# Patient Record
Sex: Female | Born: 1960 | Race: White | Hispanic: No | Smoking: Current some day smoker
Health system: Southern US, Community
[De-identification: ages and names within clinical notes are randomized; demographics above are authoritative.]

## PROBLEM LIST (undated history)

## (undated) DIAGNOSIS — N390 Urinary tract infection, site not specified: Secondary | ICD-10-CM

## (undated) DIAGNOSIS — E876 Hypokalemia: Secondary | ICD-10-CM

## (undated) DIAGNOSIS — F1420 Cocaine dependence, uncomplicated: Secondary | ICD-10-CM

## (undated) DIAGNOSIS — C801 Malignant (primary) neoplasm, unspecified: Secondary | ICD-10-CM

## (undated) DIAGNOSIS — N12 Tubulo-interstitial nephritis, not specified as acute or chronic: Secondary | ICD-10-CM

## (undated) DIAGNOSIS — N200 Calculus of kidney: Secondary | ICD-10-CM

## (undated) DIAGNOSIS — Z720 Tobacco use: Secondary | ICD-10-CM

## (undated) DIAGNOSIS — R6510 Systemic inflammatory response syndrome (SIRS) of non-infectious origin without acute organ dysfunction: Secondary | ICD-10-CM

## (undated) HISTORY — PX: VAGINAL HYSTERECTOMY: SUR661

## (undated) HISTORY — PX: TUBAL LIGATION: SHX77

## (undated) HISTORY — PX: RENAL ARTERY STENT: SHX2321

---

## 2001-01-14 ENCOUNTER — Emergency Department (HOSPITAL_COMMUNITY): Admission: EM | Admit: 2001-01-14 | Discharge: 2001-01-14 | Payer: Self-pay | Admitting: Emergency Medicine

## 2001-02-17 ENCOUNTER — Emergency Department (HOSPITAL_COMMUNITY): Admission: EM | Admit: 2001-02-17 | Discharge: 2001-02-18 | Payer: Self-pay | Admitting: Emergency Medicine

## 2001-02-18 ENCOUNTER — Encounter: Payer: Self-pay | Admitting: Emergency Medicine

## 2003-03-10 ENCOUNTER — Emergency Department (HOSPITAL_COMMUNITY): Admission: EM | Admit: 2003-03-10 | Discharge: 2003-03-11 | Payer: Self-pay | Admitting: Emergency Medicine

## 2003-05-21 ENCOUNTER — Emergency Department (HOSPITAL_COMMUNITY): Admission: EM | Admit: 2003-05-21 | Discharge: 2003-05-21 | Payer: Self-pay | Admitting: *Deleted

## 2003-07-21 ENCOUNTER — Emergency Department (HOSPITAL_COMMUNITY): Admission: EM | Admit: 2003-07-21 | Discharge: 2003-07-21 | Payer: Self-pay | Admitting: Emergency Medicine

## 2003-08-04 ENCOUNTER — Emergency Department (HOSPITAL_COMMUNITY): Admission: EM | Admit: 2003-08-04 | Discharge: 2003-08-04 | Payer: Self-pay | Admitting: Emergency Medicine

## 2003-08-14 ENCOUNTER — Emergency Department (HOSPITAL_COMMUNITY): Admission: EM | Admit: 2003-08-14 | Discharge: 2003-08-15 | Payer: Self-pay | Admitting: Emergency Medicine

## 2004-07-28 ENCOUNTER — Emergency Department (HOSPITAL_COMMUNITY): Admission: EM | Admit: 2004-07-28 | Discharge: 2004-07-28 | Payer: Self-pay | Admitting: Emergency Medicine

## 2004-11-06 ENCOUNTER — Emergency Department (HOSPITAL_COMMUNITY): Admission: EM | Admit: 2004-11-06 | Discharge: 2004-11-06 | Payer: Self-pay | Admitting: Emergency Medicine

## 2004-11-07 ENCOUNTER — Emergency Department (HOSPITAL_COMMUNITY): Admission: EM | Admit: 2004-11-07 | Discharge: 2004-11-07 | Payer: Self-pay | Admitting: Family Medicine

## 2004-11-15 ENCOUNTER — Emergency Department (HOSPITAL_COMMUNITY): Admission: EM | Admit: 2004-11-15 | Discharge: 2004-11-15 | Payer: Self-pay | Admitting: Family Medicine

## 2004-11-22 ENCOUNTER — Emergency Department (HOSPITAL_COMMUNITY): Admission: EM | Admit: 2004-11-22 | Discharge: 2004-11-22 | Payer: Self-pay | Admitting: Emergency Medicine

## 2004-12-01 ENCOUNTER — Ambulatory Visit (HOSPITAL_COMMUNITY): Admission: RE | Admit: 2004-12-01 | Discharge: 2004-12-01 | Payer: Self-pay | Admitting: Family Medicine

## 2005-06-29 ENCOUNTER — Ambulatory Visit: Payer: Self-pay | Admitting: Family Medicine

## 2005-07-02 ENCOUNTER — Ambulatory Visit: Payer: Self-pay | Admitting: *Deleted

## 2005-07-25 ENCOUNTER — Ambulatory Visit: Payer: Self-pay | Admitting: Family Medicine

## 2005-09-04 ENCOUNTER — Ambulatory Visit: Payer: Self-pay | Admitting: Family Medicine

## 2005-09-04 ENCOUNTER — Encounter (INDEPENDENT_AMBULATORY_CARE_PROVIDER_SITE_OTHER): Payer: Self-pay | Admitting: *Deleted

## 2005-09-18 ENCOUNTER — Ambulatory Visit (HOSPITAL_COMMUNITY): Admission: RE | Admit: 2005-09-18 | Discharge: 2005-09-18 | Payer: Self-pay | Admitting: Family Medicine

## 2005-10-16 ENCOUNTER — Ambulatory Visit: Payer: Self-pay | Admitting: Family Medicine

## 2005-12-12 ENCOUNTER — Ambulatory Visit: Payer: Self-pay | Admitting: Obstetrics and Gynecology

## 2005-12-12 ENCOUNTER — Other Ambulatory Visit: Admission: RE | Admit: 2005-12-12 | Discharge: 2005-12-12 | Payer: Self-pay | Admitting: Obstetrics & Gynecology

## 2005-12-12 ENCOUNTER — Encounter (INDEPENDENT_AMBULATORY_CARE_PROVIDER_SITE_OTHER): Payer: Self-pay | Admitting: Specialist

## 2005-12-27 ENCOUNTER — Ambulatory Visit: Payer: Self-pay | Admitting: Obstetrics and Gynecology

## 2005-12-30 ENCOUNTER — Emergency Department (HOSPITAL_COMMUNITY): Admission: EM | Admit: 2005-12-30 | Discharge: 2005-12-30 | Payer: Self-pay | Admitting: Emergency Medicine

## 2006-02-06 ENCOUNTER — Ambulatory Visit: Payer: Self-pay | Admitting: Nurse Practitioner

## 2006-04-08 ENCOUNTER — Encounter (INDEPENDENT_AMBULATORY_CARE_PROVIDER_SITE_OTHER): Payer: Self-pay | Admitting: *Deleted

## 2006-04-08 ENCOUNTER — Ambulatory Visit: Payer: Self-pay | Admitting: Obstetrics and Gynecology

## 2006-04-08 ENCOUNTER — Ambulatory Visit (HOSPITAL_COMMUNITY): Admission: RE | Admit: 2006-04-08 | Discharge: 2006-04-08 | Payer: Self-pay | Admitting: Obstetrics and Gynecology

## 2006-04-13 ENCOUNTER — Emergency Department (HOSPITAL_COMMUNITY): Admission: EM | Admit: 2006-04-13 | Discharge: 2006-04-13 | Payer: Self-pay | Admitting: Emergency Medicine

## 2006-05-16 ENCOUNTER — Ambulatory Visit: Payer: Self-pay | Admitting: Nurse Practitioner

## 2006-05-30 ENCOUNTER — Ambulatory Visit: Payer: Self-pay | Admitting: Gynecology

## 2006-06-03 ENCOUNTER — Ambulatory Visit (HOSPITAL_COMMUNITY): Admission: RE | Admit: 2006-06-03 | Discharge: 2006-06-03 | Payer: Self-pay | Admitting: Obstetrics and Gynecology

## 2006-07-05 ENCOUNTER — Ambulatory Visit: Payer: Self-pay | Admitting: Nurse Practitioner

## 2006-07-10 ENCOUNTER — Ambulatory Visit: Payer: Self-pay | Admitting: Family Medicine

## 2006-07-10 ENCOUNTER — Encounter (INDEPENDENT_AMBULATORY_CARE_PROVIDER_SITE_OTHER): Payer: Self-pay | Admitting: Specialist

## 2006-07-10 ENCOUNTER — Ambulatory Visit (HOSPITAL_COMMUNITY): Admission: RE | Admit: 2006-07-10 | Discharge: 2006-07-11 | Payer: Self-pay | Admitting: Gynecology

## 2006-07-16 ENCOUNTER — Inpatient Hospital Stay (HOSPITAL_COMMUNITY): Admission: AD | Admit: 2006-07-16 | Discharge: 2006-07-16 | Payer: Self-pay | Admitting: Gynecology

## 2006-09-30 ENCOUNTER — Emergency Department (HOSPITAL_COMMUNITY): Admission: EM | Admit: 2006-09-30 | Discharge: 2006-10-01 | Payer: Self-pay | Admitting: Emergency Medicine

## 2006-10-30 ENCOUNTER — Emergency Department (HOSPITAL_COMMUNITY): Admission: EM | Admit: 2006-10-30 | Discharge: 2006-10-30 | Payer: Self-pay | Admitting: Emergency Medicine

## 2006-12-27 ENCOUNTER — Ambulatory Visit: Payer: Self-pay | Admitting: Internal Medicine

## 2007-01-06 ENCOUNTER — Emergency Department (HOSPITAL_COMMUNITY): Admission: EM | Admit: 2007-01-06 | Discharge: 2007-01-06 | Payer: Self-pay | Admitting: *Deleted

## 2007-01-08 ENCOUNTER — Encounter (INDEPENDENT_AMBULATORY_CARE_PROVIDER_SITE_OTHER): Payer: Self-pay | Admitting: *Deleted

## 2007-07-23 ENCOUNTER — Ambulatory Visit: Payer: Self-pay | Admitting: Internal Medicine

## 2007-10-22 ENCOUNTER — Encounter (INDEPENDENT_AMBULATORY_CARE_PROVIDER_SITE_OTHER): Payer: Self-pay | Admitting: Family Medicine

## 2007-10-22 ENCOUNTER — Ambulatory Visit: Payer: Self-pay | Admitting: Internal Medicine

## 2007-10-22 LAB — CONVERTED CEMR LAB
Benzodiazepines.: NEGATIVE
Cocaine Metabolites: NEGATIVE
Marijuana Metabolite: NEGATIVE
Methadone: NEGATIVE
Opiate Screen, Urine: POSITIVE — AB
Phencyclidine (PCP): NEGATIVE
Propoxyphene: NEGATIVE

## 2008-01-07 ENCOUNTER — Ambulatory Visit: Payer: Self-pay | Admitting: Internal Medicine

## 2008-01-15 ENCOUNTER — Ambulatory Visit (HOSPITAL_COMMUNITY): Admission: RE | Admit: 2008-01-15 | Discharge: 2008-01-15 | Payer: Self-pay | Admitting: Family Medicine

## 2008-04-02 ENCOUNTER — Ambulatory Visit: Payer: Self-pay | Admitting: Internal Medicine

## 2008-04-02 ENCOUNTER — Encounter (INDEPENDENT_AMBULATORY_CARE_PROVIDER_SITE_OTHER): Payer: Self-pay | Admitting: Internal Medicine

## 2008-06-30 ENCOUNTER — Encounter
Admission: RE | Admit: 2008-06-30 | Discharge: 2008-06-30 | Payer: Self-pay | Admitting: Physical Medicine and Rehabilitation

## 2009-10-26 ENCOUNTER — Ambulatory Visit: Payer: Self-pay | Admitting: Internal Medicine

## 2009-10-27 ENCOUNTER — Encounter (INDEPENDENT_AMBULATORY_CARE_PROVIDER_SITE_OTHER): Payer: Self-pay | Admitting: Adult Health

## 2009-10-27 LAB — CONVERTED CEMR LAB
Barbiturate Quant, Ur: NEGATIVE
Creatinine,U: 67.9 mg/dL
Propoxyphene: NEGATIVE

## 2009-10-29 ENCOUNTER — Emergency Department (HOSPITAL_COMMUNITY): Admission: EM | Admit: 2009-10-29 | Discharge: 2009-10-29 | Payer: Self-pay | Admitting: Emergency Medicine

## 2009-11-03 ENCOUNTER — Ambulatory Visit (HOSPITAL_COMMUNITY): Admission: RE | Admit: 2009-11-03 | Discharge: 2009-11-03 | Payer: Self-pay | Admitting: Internal Medicine

## 2009-11-08 ENCOUNTER — Ambulatory Visit: Payer: Self-pay | Admitting: Adult Health

## 2009-11-08 LAB — CONVERTED CEMR LAB
ALT: 17 units/L (ref 0–35)
Alkaline Phosphatase: 60 units/L (ref 39–117)
BUN: 16 mg/dL (ref 6–23)
CO2: 27 meq/L (ref 19–32)
Chloride: 107 meq/L (ref 96–112)
Creatinine, Ser: 0.85 mg/dL (ref 0.40–1.20)
Eosinophils Absolute: 0.2 10*3/uL (ref 0.0–0.7)
Eosinophils Relative: 3 % (ref 0–5)
Glucose, Bld: 76 mg/dL (ref 70–99)
HCT: 35.4 % — ABNORMAL LOW (ref 36.0–46.0)
Lymphs Abs: 2.2 10*3/uL (ref 0.7–4.0)
MCHC: 33.3 g/dL (ref 30.0–36.0)
Magnesium: 2 mg/dL (ref 1.5–2.5)
Monocytes Relative: 8 % (ref 3–12)
Neutro Abs: 2.5 10*3/uL (ref 1.7–7.7)
Platelets: 202 10*3/uL (ref 150–400)
Potassium: 4.5 meq/L (ref 3.5–5.3)
RBC: 3.5 M/uL — ABNORMAL LOW (ref 3.87–5.11)
RDW: 15 % (ref 11.5–15.5)
Rhuematoid fact SerPl-aCnc: <20 intl units/mL (ref 0–20)
Sodium: 141 meq/L (ref 135–145)
Total Protein: 6.5 g/dL (ref 6.0–8.3)
WBC: 5.3 10*3/uL (ref 4.0–10.5)

## 2009-11-10 ENCOUNTER — Ambulatory Visit (HOSPITAL_COMMUNITY): Admission: RE | Admit: 2009-11-10 | Discharge: 2009-11-10 | Payer: Self-pay | Admitting: Internal Medicine

## 2009-11-14 ENCOUNTER — Ambulatory Visit: Payer: Self-pay | Admitting: Family Medicine

## 2009-11-23 ENCOUNTER — Ambulatory Visit (HOSPITAL_COMMUNITY): Admission: RE | Admit: 2009-11-23 | Discharge: 2009-11-23 | Payer: Self-pay | Admitting: Internal Medicine

## 2009-12-06 ENCOUNTER — Ambulatory Visit: Payer: Self-pay | Admitting: Internal Medicine

## 2009-12-13 ENCOUNTER — Encounter
Admission: RE | Admit: 2009-12-13 | Discharge: 2010-03-13 | Payer: Self-pay | Admitting: Physical Medicine and Rehabilitation

## 2010-02-03 ENCOUNTER — Ambulatory Visit (HOSPITAL_COMMUNITY): Admission: RE | Admit: 2010-02-03 | Discharge: 2010-02-03 | Payer: Self-pay | Admitting: Family Medicine

## 2010-02-09 ENCOUNTER — Emergency Department (HOSPITAL_COMMUNITY): Admission: EM | Admit: 2010-02-09 | Discharge: 2010-02-09 | Payer: Self-pay | Admitting: Emergency Medicine

## 2010-02-27 ENCOUNTER — Emergency Department (HOSPITAL_COMMUNITY): Admission: EM | Admit: 2010-02-27 | Discharge: 2010-02-27 | Payer: Self-pay | Admitting: Emergency Medicine

## 2010-05-03 ENCOUNTER — Encounter (INDEPENDENT_AMBULATORY_CARE_PROVIDER_SITE_OTHER): Payer: Self-pay | Admitting: Internal Medicine

## 2010-05-03 LAB — CONVERTED CEMR LAB
Amphetamine Screen, Ur: NEGATIVE
Barbiturate Quant, Ur: NEGATIVE
Benzodiazepines.: NEGATIVE
Creatinine,U: 15.3 mg/dL
Opiate Screen, Urine: NEGATIVE
Propoxyphene: NEGATIVE

## 2010-05-14 ENCOUNTER — Encounter: Payer: Self-pay | Admitting: Family Medicine

## 2010-05-14 ENCOUNTER — Encounter: Payer: Self-pay | Admitting: Internal Medicine

## 2010-07-04 LAB — URINALYSIS, ROUTINE W REFLEX MICROSCOPIC
Ketones, ur: NEGATIVE mg/dL
Nitrite: NEGATIVE
Protein, ur: NEGATIVE mg/dL
pH: 6 (ref 5.0–8.0)

## 2010-09-08 NOTE — Discharge Summary (Signed)
NAMECATTIE, Kelli Jenkins               ACCOUNT NO.:  [id number]   MEDICAL RECORD NO.:  [id number]          PATIENT TYPE:  OIB   LOCATION:  9308                          [facility]   PHYSICIAN:  Ginger Carne, MD  DATE OF BIRTH:  1961-03-06   DATE OF ADMISSION:  07/10/2006  DATE OF DISCHARGE:  07/11/2006                               DISCHARGE SUMMARY   REASON FOR HOSPITALIZATION:  Stage IA1 carcinoma of cervix.   IN-HOSPITAL PROCEDURES:  Total vaginal hysterectomy with preservation of  both tubes and ovaries.   FINAL DIAGNOSIS:  Total vaginal hysterectomy with preservation of both  tubes and ovaries.   HOSPITAL COURSE:  This patient was admitted because of stage IA1  carcinoma of the cervix.  The patient had appropriate cold knife cone  biopsy prior to performing the aforementioned surgery on March 19,  20008.  Pathology revealed microinvasion of 2 mm.  The patient's  intraoperative course was unremarkable.  Postoperatively, she had an  uneventful course.  She was afebrile, voided well.  Scant vaginal flow.  Calves without tenderness.  Lungs clear.  At the time of this dictation,  H&H pending.   Discharge instructions included contacting the office for temperature  elevation above 1.4 degrees Fahrenheit, increasing abdominal pain,  vaginal bleeding, discharge, constipation, or difficulties with voiding.  The patient had a prescription from another [name] for Vicodin and was  not provided a new narcotic prescription.  The patient was asked to  return to the office in 4 weeks at the [facility] for postoperative evaluation.  All questions answered to the  satisfaction of said patient, and the patient verbalized understanding  of same.      Ginger Carne, MD  Electronically Signed     SHB/MEDQ  D:  07/11/2006  T:  07/11/2006  Job:  [id number]

## 2010-09-08 NOTE — H&P (Signed)
NAME:  Kelli Jenkins, Kelli Jenkins               ACCOUNT NO.:  [id number]   MEDICAL RECORD NO.:  [id number]         PATIENT TYPE:  WOIB   LOCATION:                                [facility]   PHYSICIAN:  Ginger Carne, MD  DATE OF BIRTH:  March 28, 1961   DATE OF ADMISSION:  07/10/2006  DATE OF DISCHARGE:                              HISTORY & PHYSICAL   REASON FOR HOSPITALIZATION:  Micro invasive carcinoma of cervix, stage  IA1.   IN-HOSPITAL PROCEDURES:  Total vaginal hysterectomy, possible total  abdominal hysterectomy.   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  The patient has had a cold-knife cone biopsy  performed on April 08, 2006 which demonstrated focus of microinvasion  squamous cell carcinoma measuring 0.2 cm in depth (2 mm and 0.3 cm in  lateral extension).  Lesion was also present at the endocervical margin  of resection.   OBSTETRICAL/GYNECOLOGICAL HISTORY:  The patient has had three full-term  vaginal deliveries including a cesarean section.   ALLERGIES:  DARVOCET AND IBUPROFEN.  No allergies to latex or iodine.   CURRENT MEDICATIONS:  1. Flexeril 10 mg q.8h. p.r.n. low back pain.  2. Percocet 5/325 one to two every 4 to 6 hours p.r.n. low back pain.   MEDICAL HISTORY:  The patient has arthritis and hypercholesterolemia.   PAST SURGICAL HISTORY:  Bilateral tubal ligation and cold-knife cone  biopsy.   REVIEW OF SYSTEMS:  Fourteen-comprehensive review of systems within  normal limits.   FAMILY HISTORY:  Her brother and mother have type 2 diabetes, and her  brothers have hypertension.   SOCIAL HISTORY:  The patient smokes a half-pack of cigarettes a day.  Denies alcohol or illicit drug abuse.   PHYSICAL EXAMINATION:  Pleasant-appearing female no acute distress.  Blood pressure is 134/89, weight 190 pounds, height 5 feet 8 inches.  HEENT: Grossly normal.  BREAST EXAM:  Without masses, discharge, thickenings or tenderness.  CHEST:  Clear to percussion and auscultation.  CARDIOVASCULAR EXAM:  Without murmurs or enlargements.  Regular rate and  rhythm.  EXTREMITY, LYMPHATIC, SKIN, NEUROLOGICAL, MUSCULOSKELETAL SYSTEMS:  Within normal limits.  ABDOMEN:  Soft without gross hepatosplenomegaly.  PELVIC EXAM:  External Genitalia:  Vulva and vagina normal.  Cervix:  Smooth without erosions or lesions.  Uterus is normal size.  Both adnexa  palpable and found to be normal.   IMPRESSION:  Is stage IA1 carcinoma in situ of cervix.   PLAN:  The patient has no desire for further childbearing and is a  candidate for a total vaginal hysterectomy with preservation of both  tubes and ovaries.  Due to having had a previous cesarean section, a  possible total abdominal hysterectomy or laparoscopic-assisted vaginal  hysterectomy were discussed and understood by said patient.  Ashby Dawes of  said procedure discussed in detail.  Risks including possible injuries  to ureter, bowel and bladder; possible conversion to an open or  laparoscopic procedure were discussed; possible hemorrhage requiring  blood transfusion; infection; pulmonary or infectious complications were  also understood and discussed with the patient.  The patient is  scheduled for a total vaginal hysterectomy on July 10, 2006.      Ginger Carne, MD  Electronically Signed     SHB/MEDQ  D:  07/08/2006  T:  07/08/2006  Job:  [id number]

## 2010-09-08 NOTE — Group Therapy Note (Signed)
NAME:  Kelli Jenkins, Kelli Jenkins NO.:  [id number]   MEDICAL RECORD NO.:  [id number]          PATIENT TYPE:  WOC   LOCATION:  [facility]                   [facility]   PHYSICIAN:  Argentina Donovan, MD        DATE OF BIRTH:  03-Oct-1960   DATE OF SERVICE:  12/27/2005                                    CLINIC NOTE   The patient is a 50 year old gravida 3, para 3-0-0-3 who came in for her  colposcopy results which showed high-grade intraepithelial lesion including  foci of squamous cell carcinoma in situ.  The endocervix showed fragments of  high-grade squamous intraepithelial lesion CIN-3 in the few biopsy fragments  of a solid mass of atypical cells consistent with high-grade intraepithelial  lesion and squamous cell carcinoma.  The patient is going to be scheduled  for a cold knife conization of the cervix.  We have discussed the procedure  and pictures of what we will be doing.  We have talked about the  complication of bleeding possible.  I have talked about the virus that  causes it and the advantage of her having stopped smoking as she has also  already done.  The patient has had one cesarean section other than that no  other surgeries.   ALLERGIES:  SHE IS ALLERGIC TO DARVOCET AND IBUPROFEN.  She is not allergic  to latex.           ______________________________  Argentina Donovan, MD     PR/MEDQ  D:  12/27/2005  T:  12/27/2005  Job:  [id number]

## 2010-09-08 NOTE — Op Note (Signed)
NAME:  Kelli Jenkins, Kelli Jenkins               ACCOUNT NO.:  [id number]   MEDICAL RECORD NO.:  [id number]          PATIENT TYPE:  AMB   LOCATION:  SDC                           [facility]   PHYSICIAN:  Ginger Carne, MD  DATE OF BIRTH:  03/22/1961   DATE OF PROCEDURE:  07/10/2006  DATE OF DISCHARGE:                               OPERATIVE REPORT   PREOPERATIVE DIAGNOSIS:  Stage IA1 carcinoma of cervix.   POSTOPERATIVE DIAGNOSIS:  Stage IA1 carcinoma of cervix.   PROCEDURE:  Total vaginal hysterectomy with preservation of tubes and  ovaries.   PRIMARY SURGEON:  Dr. Shawnie Pons   ASSISTANT:  Dr. Mia Creek.   ESTIMATED BLOOD LOSS:  100 mL.   COMPLICATIONS:  None immediate.   ANESTHESIA:  General.   SPECIMEN:  Uterus, cervix to pathology.   OPERATIVE FINDINGS:  Uterus, tubes and ovaries were unremarkable.  Uterus was globular, about 6 weeks in size consistent with a multiparous  uterus.   OPERATIVE PROCEDURE:  The patient prepped and draped in usual fashion  and placed in lithotomy position.  Betadine solution used for  antiseptic.  The patient catheterized prior to procedure.  After  adequate general anesthesia, tenaculum placed on the anterior and  posterior lips of the cervix.  Circumferential incision made around the  cervix 1-2 cm cephalad to the external os.  Marcaine with epinephrine  was used paracervically prior to making said incision.  Afterwards, the  anterior and posterior peritoneal reflections identified, opened without  injury to their respective organs.  Following this, uterosacral cardinal  ligament complex was clamped, cut, and ligated with 0 Vicryl suture.  Ligature unit used for bipolar cauterization and cutting of the uterine  vasculature with its ascending branches, clamped, cut, and ligation with  0 Vicryl suture of utero-ovarian ligaments bilaterally.  Bleeding points  hemostatically checked.  Blood clots removed.  Closure of the cuff in 1  layer with 0 Vicryl  running interlocking suture, separate suturing  performed in the left vaginal cuff due to bleeding.  The patient  tolerated the procedure well, returned to the postanesthesia recovery  room in excellent condition.  Vaginal packing utilized.     Ginger Carne, MD  Electronically Signed    SHB/MEDQ  D:  07/10/2006  T:  07/10/2006  Job:  [id number]

## 2010-12-14 ENCOUNTER — Emergency Department (HOSPITAL_COMMUNITY)
Admission: EM | Admit: 2010-12-14 | Discharge: 2010-12-14 | Disposition: A | Discharge status: Home / Self Care | Payer: Self-pay | Attending: Emergency Medicine | Admitting: Emergency Medicine

## 2010-12-14 DIAGNOSIS — H7290 Unspecified perforation of tympanic membrane, unspecified ear: Secondary | ICD-10-CM | POA: Insufficient documentation

## 2010-12-14 DIAGNOSIS — H9209 Otalgia, unspecified ear: Secondary | ICD-10-CM | POA: Insufficient documentation

## 2011-01-10 ENCOUNTER — Emergency Department (HOSPITAL_COMMUNITY)
Admission: EM | Admit: 2011-01-10 | Discharge: 2011-01-10 | Disposition: A | Discharge status: Home / Self Care | Payer: No Typology Code available for payment source | Attending: Emergency Medicine | Admitting: Emergency Medicine

## 2011-01-10 ENCOUNTER — Emergency Department (HOSPITAL_COMMUNITY): Payer: Self-pay

## 2011-01-10 DIAGNOSIS — T148XXA Other injury of unspecified body region, initial encounter: Secondary | ICD-10-CM | POA: Insufficient documentation

## 2011-01-10 DIAGNOSIS — S335XXA Sprain of ligaments of lumbar spine, initial encounter: Secondary | ICD-10-CM | POA: Insufficient documentation

## 2011-01-10 DIAGNOSIS — Y92410 Unspecified street and highway as the place of occurrence of the external cause: Secondary | ICD-10-CM | POA: Insufficient documentation

## 2011-01-10 DIAGNOSIS — S139XXA Sprain of joints and ligaments of unspecified parts of neck, initial encounter: Secondary | ICD-10-CM | POA: Insufficient documentation

## 2011-01-30 ENCOUNTER — Emergency Department (HOSPITAL_COMMUNITY): Payer: Self-pay

## 2011-01-30 ENCOUNTER — Emergency Department (HOSPITAL_COMMUNITY)
Admission: EM | Admit: 2011-01-30 | Discharge: 2011-01-30 | Disposition: A | Discharge status: Home / Self Care | Payer: Self-pay | Attending: Emergency Medicine | Admitting: Emergency Medicine

## 2011-01-30 DIAGNOSIS — R109 Unspecified abdominal pain: Secondary | ICD-10-CM | POA: Insufficient documentation

## 2011-01-30 DIAGNOSIS — R112 Nausea with vomiting, unspecified: Secondary | ICD-10-CM | POA: Insufficient documentation

## 2011-01-30 DIAGNOSIS — N200 Calculus of kidney: Secondary | ICD-10-CM | POA: Insufficient documentation

## 2011-01-30 LAB — DIFFERENTIAL
Basophils Absolute: 0 10*3/uL (ref 0.0–0.1)
Eosinophils Relative: 1 % (ref 0–5)
Lymphocytes Relative: 20 % (ref 12–46)
Lymphs Abs: 2.1 10*3/uL (ref 0.7–4.0)
Neutro Abs: 7.6 10*3/uL (ref 1.7–7.7)
Neutrophils Relative %: 73 % (ref 43–77)

## 2011-01-30 LAB — CBC
HCT: 37.6 % (ref 36.0–46.0)
MCV: 93.8 fL (ref 78.0–100.0)
RBC: 4.01 MIL/uL (ref 3.87–5.11)
RDW: 13.1 % (ref 11.5–15.5)
WBC: 10.4 10*3/uL (ref 4.0–10.5)

## 2011-01-30 LAB — LIPASE, BLOOD: Lipase: 33 U/L (ref 11–59)

## 2011-01-30 LAB — COMPREHENSIVE METABOLIC PANEL
AST: 12 U/L (ref 0–37)
Albumin: 3.8 g/dL (ref 3.5–5.2)
Alkaline Phosphatase: 87 U/L (ref 39–117)
BUN: 17 mg/dL (ref 6–23)
CO2: 26 mEq/L (ref 19–32)
Chloride: 102 mEq/L (ref 96–112)
GFR calc non Af Amer: 69 mL/min — ABNORMAL LOW (ref >90)
Potassium: 3.7 mEq/L (ref 3.5–5.1)
Total Bilirubin: 0.4 mg/dL (ref 0.3–1.2)

## 2011-02-01 ENCOUNTER — Emergency Department (HOSPITAL_COMMUNITY)
Admission: EM | Admit: 2011-02-01 | Discharge: 2011-02-01 | Disposition: A | Discharge status: Home / Self Care | Payer: Self-pay | Attending: Emergency Medicine | Admitting: Emergency Medicine

## 2011-02-01 ENCOUNTER — Emergency Department (HOSPITAL_COMMUNITY): Payer: Self-pay

## 2011-02-01 DIAGNOSIS — R112 Nausea with vomiting, unspecified: Secondary | ICD-10-CM | POA: Insufficient documentation

## 2011-02-01 DIAGNOSIS — R1013 Epigastric pain: Secondary | ICD-10-CM | POA: Insufficient documentation

## 2011-02-01 DIAGNOSIS — K219 Gastro-esophageal reflux disease without esophagitis: Secondary | ICD-10-CM | POA: Insufficient documentation

## 2011-02-01 DIAGNOSIS — Z791 Long term (current) use of non-steroidal anti-inflammatories (NSAID): Secondary | ICD-10-CM | POA: Insufficient documentation

## 2011-02-01 DIAGNOSIS — N390 Urinary tract infection, site not specified: Secondary | ICD-10-CM | POA: Insufficient documentation

## 2011-02-01 DIAGNOSIS — F1410 Cocaine abuse, uncomplicated: Secondary | ICD-10-CM | POA: Insufficient documentation

## 2011-02-01 LAB — I-STAT 8, (EC8 V) (CONVERTED LAB)
Bicarbonate: 24.6 — ABNORMAL HIGH
Glucose, Bld: 105 — ABNORMAL HIGH
TCO2: 26
pCO2, Ven: 33.6 — ABNORMAL LOW
pH, Ven: 7.474 — ABNORMAL HIGH

## 2011-02-01 LAB — URINE MICROSCOPIC-ADD ON

## 2011-02-01 LAB — POCT CARDIAC MARKERS
CKMB, poc: 1.1
CKMB, poc: 2.3
Myoglobin, poc: 51.1
Operator id: 285841
Operator id: 285841
Troponin i, poc: 0.07 — ABNORMAL HIGH
Troponin i, poc: <0.05

## 2011-02-01 LAB — COMPREHENSIVE METABOLIC PANEL
AST: 13 U/L (ref 0–37)
Albumin: 3.6 g/dL (ref 3.5–5.2)
Calcium: 9.4 mg/dL (ref 8.4–10.5)
Creatinine, Ser: 0.92 mg/dL (ref 0.50–1.10)
GFR calc non Af Amer: 71 mL/min — ABNORMAL LOW (ref >90)
Total Protein: 6.9 g/dL (ref 6.0–8.3)

## 2011-02-01 LAB — URINALYSIS, ROUTINE W REFLEX MICROSCOPIC
Bilirubin Urine: NEGATIVE
Glucose, UA: NEGATIVE mg/dL
Glucose, UA: NEGATIVE
Ketones, ur: NEGATIVE
Nitrite: POSITIVE — AB
Nitrite: NEGATIVE
Protein, ur: NEGATIVE
Specific Gravity, Urine: 1.019 (ref 1.005–1.030)
pH: 6.5 (ref 5.0–8.0)

## 2011-02-01 LAB — CBC
HCT: 33.9 — ABNORMAL LOW
MCH: 32.8 pg (ref 26.0–34.0)
MCV: 95.3 fL (ref 78.0–100.0)
MCV: 95.2
Platelets: 266 10*3/uL (ref 150–400)
Platelets: 441 — ABNORMAL HIGH
RDW: 13.1 % (ref 11.5–15.5)
RDW: 13.2

## 2011-02-01 LAB — DIFFERENTIAL
Basophils Absolute: 0
Basophils Relative: 0 % (ref 0–1)
Eosinophils Absolute: 0.1 10*3/uL (ref 0.0–0.7)
Eosinophils Absolute: 0.1
Eosinophils Relative: 1 % (ref 0–5)
Eosinophils Relative: 1
Lymphocytes Relative: 20
Lymphs Abs: 2.2 10*3/uL (ref 0.7–4.0)
Monocytes Relative: 6 % (ref 3–12)

## 2011-02-01 LAB — POCT I-STAT TROPONIN I: Troponin i, poc: 0 ng/mL (ref 0.00–0.08)

## 2011-02-01 LAB — RAPID URINE DRUG SCREEN, HOSP PERFORMED
Barbiturates: NOT DETECTED
Benzodiazepines: NOT DETECTED
Cocaine: POSITIVE — AB
Opiates: NOT DETECTED

## 2011-02-01 LAB — D-DIMER, QUANTITATIVE: D-Dimer, Quant: 0.55 — ABNORMAL HIGH

## 2011-02-01 LAB — POCT I-STAT CREATININE: Operator id: 133351

## 2011-02-01 LAB — LIPASE, BLOOD: Lipase: 47 U/L (ref 11–59)

## 2011-02-08 LAB — COMPREHENSIVE METABOLIC PANEL
AST: 16
Albumin: 3.6
Alkaline Phosphatase: 67
BUN: 11
Chloride: 100
GFR calc Af Amer: >60
Potassium: 4.6
Total Bilirubin: 0.9

## 2011-02-08 LAB — CBC
HCT: 40.5
Platelets: 241
WBC: 11.7 — ABNORMAL HIGH

## 2011-02-08 LAB — URINALYSIS, ROUTINE W REFLEX MICROSCOPIC
Glucose, UA: NEGATIVE
pH: 6

## 2011-02-08 LAB — DIFFERENTIAL
Basophils Absolute: 0
Basophils Relative: 0
Eosinophils Absolute: 0
Eosinophils Relative: 0
Monocytes Absolute: 0.9 — ABNORMAL HIGH

## 2011-02-08 LAB — URINE MICROSCOPIC-ADD ON

## 2011-04-12 ENCOUNTER — Emergency Department (HOSPITAL_COMMUNITY)
Admission: EM | Admit: 2011-04-12 | Discharge: 2011-04-12 | Discharge status: Against Medical Advice | Payer: Self-pay | Attending: Emergency Medicine | Admitting: Emergency Medicine

## 2011-04-12 ENCOUNTER — Other Ambulatory Visit: Payer: Self-pay

## 2011-04-12 ENCOUNTER — Encounter: Payer: Self-pay | Admitting: *Deleted

## 2011-04-12 DIAGNOSIS — H9209 Otalgia, unspecified ear: Secondary | ICD-10-CM | POA: Insufficient documentation

## 2011-04-12 DIAGNOSIS — R079 Chest pain, unspecified: Secondary | ICD-10-CM | POA: Insufficient documentation

## 2011-04-12 NOTE — ED Notes (Signed)
Unable to locate x2.

## 2011-08-02 ENCOUNTER — Encounter (HOSPITAL_COMMUNITY): Payer: Self-pay | Admitting: Emergency Medicine

## 2011-08-02 ENCOUNTER — Emergency Department (HOSPITAL_COMMUNITY)
Admission: EM | Admit: 2011-08-02 | Discharge: 2011-08-02 | Disposition: A | Discharge status: Home / Self Care | Payer: Self-pay | Attending: Emergency Medicine | Admitting: Emergency Medicine

## 2011-08-02 DIAGNOSIS — K0889 Other specified disorders of teeth and supporting structures: Secondary | ICD-10-CM

## 2011-08-02 DIAGNOSIS — S025XXA Fracture of tooth (traumatic), initial encounter for closed fracture: Secondary | ICD-10-CM | POA: Insufficient documentation

## 2011-08-02 DIAGNOSIS — F17200 Nicotine dependence, unspecified, uncomplicated: Secondary | ICD-10-CM | POA: Insufficient documentation

## 2011-08-02 DIAGNOSIS — K089 Disorder of teeth and supporting structures, unspecified: Secondary | ICD-10-CM | POA: Insufficient documentation

## 2011-08-02 DIAGNOSIS — IMO0002 Reserved for concepts with insufficient information to code with codable children: Secondary | ICD-10-CM | POA: Insufficient documentation

## 2011-08-02 HISTORY — DX: Malignant (primary) neoplasm, unspecified: C80.1

## 2011-08-02 MED ORDER — OXYCODONE-ACETAMINOPHEN 5-325 MG PO TABS
2.0000 | ORAL_TABLET | Freq: Once | ORAL | Status: AC
  Administered 2011-08-02: 2 via ORAL
  Filled 2011-08-02: qty 2

## 2011-08-02 MED ORDER — PENICILLIN V POTASSIUM 500 MG PO TABS: 500.0000 mg | ORAL_TABLET | Freq: Four times a day (QID) | ORAL | Status: AC

## 2011-08-02 MED ORDER — OXYCODONE-ACETAMINOPHEN 5-325 MG PO TABS: 1.0000 | ORAL_TABLET | ORAL | Status: AC | PRN

## 2011-08-02 NOTE — Progress Notes (Signed)
Patient returned to the ED post discharge stating she could not afford to fill her prescriptions. Per pharmacy, patient is eligible for the [employer]Z-fund. However, the prescription for the penicillin was remotely faxed to the [employer] and the pharmacy requires an actual prescription, not just the discharge paperwork. Therefore, the CDU PA agreed to write a new script based on the patient's chart in EPIC. The script was approved, sent to pharmacy, filled, and taken to the patient who was visiting with a family member in CDU 3.

## 2011-08-02 NOTE — ED Provider Notes (Signed)
History     CSN: [id number]  Arrival date & time 08/02/11  0444   First MD Initiated Contact with Patient 08/02/11 [phone]      Chief Complaint  Patient presents with  . Dental Pain    (Consider location/radiation/quality/duration/timing/severity/associated sxs/prior treatment) HPI Comments: Pt states that she had acute onset of dental pain approx 3 days ago when her rear upper right molar fractured while eating.  Pain is constant, worse with temperature changes or air, not associatd with f/c/n/v or swelling of the jaw.  No pain meds pta.  Patient is a 51 y.o. female presenting with tooth pain. The history is provided by the patient.  Dental PainPrimary symptoms do not include fever or sore throat.  Additional symptoms do not include: facial swelling and trouble swallowing.    Past Medical History  Diagnosis Date  . Cancer     Past Surgical History  Procedure Date  . Abdominal hysterectomy     No family history on file.  History  Substance Use Topics  . Smoking status: Current Everyday Smoker    Types: Cigarettes  . Smokeless tobacco: Not on file  . Alcohol Use: Yes     occ    OB History    Grav Para Term Preterm Abortions TAB SAB Ect Mult Living                  Review of Systems  Constitutional: Negative for fever and chills.  HENT: Positive for dental problem. Negative for sore throat, facial swelling, trouble swallowing and voice change.        Toothache  Gastrointestinal: Negative for nausea and vomiting.    Allergies  Darvocet and Ibuprofen  Home Medications   Current Outpatient Rx  Name Route Sig Dispense Refill  . OXYCODONE-ACETAMINOPHEN 5-325 MG PO TABS Oral Take 1 tablet by mouth every 4 (four) hours as needed for pain. May take 2 tablets PO q 6 hours for severe pain - Do not take with Tylenol as this tablet already contains tylenol 15 tablet 0  . PENICILLIN V POTASSIUM 500 MG PO TABS Oral Take 1 tablet (500 mg total) by mouth 4 (four) times  daily. 40 tablet 0    BP 113/58  Pulse 83  Temp(Src) 97.6 F (36.4 C) (Oral)  Resp 16  SpO2 96%  Physical Exam  Nursing note and vitals reviewed. Constitutional: She appears well-developed and well-nourished. No distress.  HENT:  Head: Normocephalic and atraumatic.  Mouth/Throat: Oropharynx is clear and moist. No oropharyngeal exudate.       Dental Disease - mutliple missing teeth - upper rear right molar with ttp and fracture, no bleeding, no surroundinga bscess.  ttp over tooth.  Eyes: Conjunctivae are normal. No scleral icterus.  Neck: Normal range of motion. Neck supple. No thyromegaly present.  Cardiovascular: Normal rate and regular rhythm.   Pulmonary/Chest: Effort normal and breath sounds normal.  Lymphadenopathy:    She has no cervical adenopathy.  Neurological: She is alert.  Skin: Skin is warm and dry. No rash noted. She is not diaphoretic.    ED Course  Procedures (including critical care time)  Labs Reviewed - No data to display No results found.   1. Toothache       MDM  Dental pain, no abscess - referred to dental, no signs of abscess  Discharge Prescriptions include:  #1 Percocet #2 PCN        Vida Roller, MD 08/02/11 [phone]

## 2011-08-02 NOTE — ED Notes (Signed)
Patient with  Upper back tooth pain.  Pain started last few days, increasing tonight.

## 2011-08-02 NOTE — Discharge Instructions (Signed)
 RESOURCE GUIDE  Dental Problems  Patients with Medicaid: [facility]                     [facility] [address]                                           [address] Phone:  [phone]                                                  Phone:  [phone]  If unable to pay or uninsured, contact:  [facility] or [facility] to become qualified for the adult dental clinic.  Chronic Pain Problems Contact [facility]  [phone] Patients need to be referred by their primary care doctor.  Insufficient Money for Medicine Contact United Way:  call "211" or [facility] 271-5999.  No Primary Care Doctor Call [facility]  [phone] Other agencies that provide inexpensive medical care    [facility]  [phone]    [facility]  [phone]    [facility]  [phone]    Women's Clinic  [phone]    [facility]  [phone]    [facility]  [phone]  Psychological Services [facility]  [phone] [facility]  [phone] [facility]   [phone] (emergency services [phone])  Substance Abuse Resources [facility]  336-882-2125 [facility] 336-784-9470 [facility] 336-285-9073 [facility] 336-845-3988 Residential & Outpatient Substance Abuse Program  800-659-3381  Abuse/Neglect [place] Child Abuse Hotline (336) 641-3795 [place] Child Abuse Hotline 800-378-5315 (After Hours)  Emergency Shelter [facility] (336) 271-5985  [facility] (336) 275-9566 [facility] (704) [phone]  MRSA Hotline #:   832-7006    [place] Resources  [facility] [place]     [employer] Way                          [facility] [address] [place]                       [address]      [address]  [place]                                                [place]                            [place] Phone:  [phone]                                   Phone:  [phone]                 Phone:  [phone]  [facility] Phone:    [phone]  [place] Child Abuse Hotline (336) 342-1394 (336) 342-3537 (After Hours)   

## 2011-08-12 ENCOUNTER — Inpatient Hospital Stay (HOSPITAL_COMMUNITY)
Admission: EM | Admit: 2011-08-12 | Discharge: 2011-08-19 | DRG: 871 | Disposition: A | Discharge status: Home / Self Care | Payer: Self-pay | Attending: Internal Medicine | Admitting: Internal Medicine

## 2011-08-12 ENCOUNTER — Emergency Department (HOSPITAL_COMMUNITY): Payer: Self-pay

## 2011-08-12 ENCOUNTER — Encounter (HOSPITAL_COMMUNITY): Payer: Self-pay | Admitting: *Deleted

## 2011-08-12 DIAGNOSIS — F17200 Nicotine dependence, unspecified, uncomplicated: Secondary | ICD-10-CM

## 2011-08-12 DIAGNOSIS — G43909 Migraine, unspecified, not intractable, without status migrainosus: Secondary | ICD-10-CM | POA: Diagnosis present

## 2011-08-12 DIAGNOSIS — N201 Calculus of ureter: Secondary | ICD-10-CM | POA: Diagnosis present

## 2011-08-12 DIAGNOSIS — N1330 Unspecified hydronephrosis: Secondary | ICD-10-CM | POA: Diagnosis present

## 2011-08-12 DIAGNOSIS — Z6830 Body mass index (BMI) 30.0-30.9, adult: Secondary | ICD-10-CM

## 2011-08-12 DIAGNOSIS — Z9079 Acquired absence of other genital organ(s): Secondary | ICD-10-CM

## 2011-08-12 DIAGNOSIS — R509 Fever, unspecified: Secondary | ICD-10-CM

## 2011-08-12 DIAGNOSIS — J189 Pneumonia, unspecified organism: Secondary | ICD-10-CM

## 2011-08-12 DIAGNOSIS — E669 Obesity, unspecified: Secondary | ICD-10-CM | POA: Diagnosis present

## 2011-08-12 DIAGNOSIS — D72829 Elevated white blood cell count, unspecified: Secondary | ICD-10-CM

## 2011-08-12 DIAGNOSIS — A419 Sepsis, unspecified organism: Principal | ICD-10-CM | POA: Diagnosis present

## 2011-08-12 DIAGNOSIS — R000 Tachycardia, unspecified: Secondary | ICD-10-CM | POA: Diagnosis present

## 2011-08-12 DIAGNOSIS — R6510 Systemic inflammatory response syndrome (SIRS) of non-infectious origin without acute organ dysfunction: Secondary | ICD-10-CM

## 2011-08-12 DIAGNOSIS — E876 Hypokalemia: Secondary | ICD-10-CM | POA: Diagnosis not present

## 2011-08-12 DIAGNOSIS — N12 Tubulo-interstitial nephritis, not specified as acute or chronic: Secondary | ICD-10-CM

## 2011-08-12 DIAGNOSIS — N23 Unspecified renal colic: Secondary | ICD-10-CM

## 2011-08-12 DIAGNOSIS — N200 Calculus of kidney: Secondary | ICD-10-CM

## 2011-08-12 DIAGNOSIS — I959 Hypotension, unspecified: Secondary | ICD-10-CM | POA: Diagnosis present

## 2011-08-12 HISTORY — DX: Tubulo-interstitial nephritis, not specified as acute or chronic: N12

## 2011-08-12 HISTORY — DX: Hypokalemia: E87.6

## 2011-08-12 HISTORY — DX: Calculus of kidney: N20.0

## 2011-08-12 HISTORY — DX: Tobacco use: Z72.0

## 2011-08-12 HISTORY — DX: Systemic inflammatory response syndrome (sirs) of non-infectious origin without acute organ dysfunction: R65.10

## 2011-08-12 LAB — DIFFERENTIAL
Basophils Absolute: 0 10*3/uL (ref 0.0–0.1)
Eosinophils Relative: 1 % (ref 0–5)
Eosinophils Relative: 0 % (ref 0–5)
Lymphocytes Relative: 18 % (ref 12–46)
Lymphocytes Relative: 3 % — ABNORMAL LOW (ref 12–46)
Lymphs Abs: 2.2 10*3/uL (ref 0.7–4.0)
Monocytes Absolute: 0.6 10*3/uL (ref 0.1–1.0)
Monocytes Relative: 5 % (ref 3–12)
WBC Morphology: INCREASED

## 2011-08-12 LAB — CBC
HCT: 41.1 % (ref 36.0–46.0)
HCT: 36.3 % (ref 36.0–46.0)
MCV: 95.6 fL (ref 78.0–100.0)
Platelets: 264 10*3/uL (ref 150–400)
Platelets: 185 10*3/uL (ref 150–400)
RBC: 4.3 MIL/uL (ref 3.87–5.11)
RBC: 3.83 MIL/uL — ABNORMAL LOW (ref 3.87–5.11)
RDW: 14 % (ref 11.5–15.5)
WBC: 12.1 10*3/uL — ABNORMAL HIGH (ref 4.0–10.5)
WBC: 26.3 10*3/uL — ABNORMAL HIGH (ref 4.0–10.5)

## 2011-08-12 LAB — URINALYSIS, ROUTINE W REFLEX MICROSCOPIC
Glucose, UA: NEGATIVE mg/dL
Protein, ur: NEGATIVE mg/dL
pH: 6 (ref 5.0–8.0)

## 2011-08-12 LAB — BASIC METABOLIC PANEL
CO2: 28 mEq/L (ref 19–32)
Calcium: 9.5 mg/dL (ref 8.4–10.5)
Chloride: 104 mEq/L (ref 96–112)
GFR calc Af Amer: 55 mL/min — ABNORMAL LOW (ref >90)
Glucose, Bld: 115 mg/dL — ABNORMAL HIGH (ref 70–99)
Potassium: 3.4 mEq/L — ABNORMAL LOW (ref 3.5–5.1)
Sodium: 138 mEq/L (ref 135–145)

## 2011-08-12 LAB — URINE MICROSCOPIC-ADD ON

## 2011-08-12 LAB — LACTIC ACID, PLASMA: Lactic Acid, Venous: 1.8 mmol/L (ref 0.5–2.2)

## 2011-08-12 LAB — PROCALCITONIN: Procalcitonin: 70.63 ng/mL

## 2011-08-12 MED ORDER — HYDROMORPHONE HCL 2 MG PO TABS: 2.0000 mg | ORAL_TABLET | ORAL | Status: DC | PRN

## 2011-08-12 MED ORDER — TAMSULOSIN HCL 0.4 MG PO CAPS: 0.4000 mg | ORAL_CAPSULE | Freq: Every day | ORAL | Status: DC

## 2011-08-12 MED ORDER — ONDANSETRON HCL 4 MG/2ML IJ SOLN
4.0000 mg | Freq: Once | INTRAMUSCULAR | Status: AC
  Administered 2011-08-12: 4 mg via INTRAVENOUS
  Filled 2011-08-12: qty 2

## 2011-08-12 MED ORDER — HYDROMORPHONE HCL PF 1 MG/ML IJ SOLN
0.5000 mg | INTRAMUSCULAR | Status: DC | PRN
  Administered 2011-08-15: 0.5 mg via INTRAVENOUS
  Filled 2011-08-12 (×2): qty 1

## 2011-08-12 MED ORDER — ONDANSETRON HCL 4 MG PO TABS
4.0000 mg | ORAL_TABLET | Freq: Four times a day (QID) | ORAL | Status: DC | PRN
  Administered 2011-08-14: 4 mg via ORAL
  Filled 2011-08-12: qty 1

## 2011-08-12 MED ORDER — SODIUM CHLORIDE 0.9 % IV SOLN: INTRAVENOUS | Status: DC

## 2011-08-12 MED ORDER — FENTANYL CITRATE 0.05 MG/ML IJ SOLN
50.0000 ug | INTRAMUSCULAR | Status: AC
  Administered 2011-08-12: 50 ug via INTRAVENOUS
  Filled 2011-08-12: qty 2

## 2011-08-12 MED ORDER — DEXTROSE 5 % IV SOLN
1.0000 g | INTRAVENOUS | Status: DC
  Administered 2011-08-12 – 2011-08-13 (×2): 1 g via INTRAVENOUS
  Filled 2011-08-12 (×3): qty 10

## 2011-08-12 MED ORDER — SODIUM CHLORIDE 0.9 % IV SOLN
1000.0000 mL | INTRAVENOUS | Status: DC
  Administered 2011-08-12 – 2011-08-13 (×3): 1000 mL via INTRAVENOUS

## 2011-08-12 MED ORDER — FENTANYL CITRATE 0.05 MG/ML IJ SOLN
INTRAMUSCULAR | Status: AC
  Administered 2011-08-12: 50 ug
  Filled 2011-08-12: qty 2

## 2011-08-12 MED ORDER — ONDANSETRON 8 MG PO TBDP: 8.0000 mg | ORAL_TABLET | Freq: Three times a day (TID) | ORAL | Status: AC | PRN

## 2011-08-12 MED ORDER — SODIUM CHLORIDE 0.9 % IJ SOLN
3.0000 mL | Freq: Two times a day (BID) | INTRAMUSCULAR | Status: DC
  Administered 2011-08-12 – 2011-08-17 (×7): 3 mL via INTRAVENOUS

## 2011-08-12 MED ORDER — ACETAMINOPHEN 325 MG PO TABS
650.0000 mg | ORAL_TABLET | Freq: Four times a day (QID) | ORAL | Status: DC | PRN
  Administered 2011-08-13 – 2011-08-15 (×7): 650 mg via ORAL
  Filled 2011-08-12 (×6): qty 2
  Filled 2011-08-12: qty 1
  Filled 2011-08-12: qty 2

## 2011-08-12 MED ORDER — SODIUM CHLORIDE 0.9 % IV SOLN
INTRAVENOUS | Status: DC
  Administered 2011-08-12 – 2011-08-14 (×4): via INTRAVENOUS

## 2011-08-12 MED ORDER — HYDROMORPHONE HCL PF 1 MG/ML IJ SOLN
1.0000 mg | Freq: Once | INTRAMUSCULAR | Status: AC
  Administered 2011-08-12: 1 mg via INTRAVENOUS
  Filled 2011-08-12: qty 1

## 2011-08-12 MED ORDER — ALUM & MAG HYDROXIDE-SIMETH 200-200-20 MG/5ML PO SUSP: 30.0000 mL | Freq: Four times a day (QID) | ORAL | Status: DC | PRN

## 2011-08-12 MED ORDER — ONDANSETRON HCL 4 MG/2ML IJ SOLN
INTRAMUSCULAR | Status: AC
  Filled 2011-08-12: qty 2

## 2011-08-12 MED ORDER — ONDANSETRON HCL 4 MG/2ML IJ SOLN
4.0000 mg | Freq: Four times a day (QID) | INTRAMUSCULAR | Status: DC | PRN
  Administered 2011-08-15 – 2011-08-19 (×2): 4 mg via INTRAVENOUS
  Filled 2011-08-12 (×2): qty 2

## 2011-08-12 MED ORDER — SODIUM CHLORIDE 0.9 % IV BOLUS (SEPSIS)
500.0000 mL | Freq: Once | INTRAVENOUS | Status: AC
  Administered 2011-08-12: 500 mL via INTRAVENOUS

## 2011-08-12 MED ORDER — ACETAMINOPHEN 650 MG RE SUPP: 650.0000 mg | Freq: Four times a day (QID) | RECTAL | Status: DC | PRN

## 2011-08-12 MED ORDER — OXYCODONE-ACETAMINOPHEN 5-325 MG PO TABS: 1.0000 | ORAL_TABLET | ORAL | Status: AC | PRN

## 2011-08-12 MED ORDER — DEXTROSE 5 % IV SOLN: 1.0000 g | INTRAVENOUS | Status: DC

## 2011-08-12 MED ORDER — PROMETHAZINE HCL 25 MG/ML IJ SOLN
25.0000 mg | Freq: Once | INTRAMUSCULAR | Status: AC
  Administered 2011-08-12: 25 mg via INTRAVENOUS
  Filled 2011-08-12: qty 1

## 2011-08-12 MED ORDER — ZOLPIDEM TARTRATE 5 MG PO TABS: 5.0000 mg | ORAL_TABLET | Freq: Every evening | ORAL | Status: DC | PRN

## 2011-08-12 MED ORDER — ENOXAPARIN SODIUM 40 MG/0.4ML ~~LOC~~ SOLN
40.0000 mg | SUBCUTANEOUS | Status: DC
  Administered 2011-08-14 – 2011-08-18 (×5): 40 mg via SUBCUTANEOUS
  Filled 2011-08-12 (×8): qty 0.4

## 2011-08-12 MED ORDER — OXYCODONE HCL 5 MG PO TABS
5.0000 mg | ORAL_TABLET | ORAL | Status: DC | PRN
  Administered 2011-08-14 – 2011-08-19 (×13): 5 mg via ORAL
  Filled 2011-08-12 (×13): qty 1

## 2011-08-12 NOTE — ED Notes (Signed)
Pt helped on to bedpan and states " I can't get up" when asked to ambulate.

## 2011-08-12 NOTE — ED Notes (Signed)
Dr. Hyman Hopes in room re-evaluating patient.

## 2011-08-12 NOTE — ED Notes (Signed)
Pt continues to sleep and snore in room. nad noted. ABC intact

## 2011-08-12 NOTE — Discharge Instructions (Signed)
Ureteral Colic (Kidney Stones)  Ureteral colic is the result of a condition when kidney stones form inside the kidney. Once kidney stones are formed they may move into the tube that connects the kidney with the bladder (ureter). If this occurs, this condition may cause pain (colic) in the ureter.  CAUSES  Pain is caused by stone movement in the ureter and the obstruction caused by the stone. SYMPTOMS  The pain comes and goes as the ureter contracts around the stone. The pain is usually intense, sharp, and stabbing in character. The location of the pain may move as the stone moves through the ureter. When the stone is near the kidney the pain is usually located in the back and radiates to the belly (abdomen). When the stone is ready to pass into the bladder the pain is often located in the lower abdomen on the side the stone is located. At this location, the symptoms may mimic those of a urinary tract infection with urinary frequency. Once the stone is located here it often passes into the bladder and the pain disappears completely. TREATMENT   Your caregiver will provide you with medicine for pain relief.   You may require specialized follow-up X-rays.   The absence of pain does not always mean that the stone has passed. It may have just stopped moving. If the urine remains completely obstructed, it can cause loss of kidney function or even complete destruction of the involved kidney. It is your responsibility and in your interest that X-rays and follow-ups as suggested by your caregiver are completed. Relief of pain without passage of the stone can be associated with severe damage to the kidney, including loss of kidney function on that side.   If your stone does not pass on its own, additional measures may be taken by your caregiver to ensure its removal.  HOME CARE INSTRUCTIONS   Increase your fluid intake. Water is the preferred fluid since juices containing vitamin C may acidify the urine  making it less likely for certain stones (uric acid stones) to pass.   Strain all urine. A strainer will be provided. Keep all particulate matter or stones for your caregiver to inspect.   Take your pain medicine as directed.   Make a follow-up appointment with your caregiver as directed.   Remember that the goal is passage of your stone. The absence of pain does not mean the stone is gone. Follow your caregiver's instructions.   Only take over-the-counter or prescription medicines for pain, discomfort, or fever as directed by your caregiver.  SEEK MEDICAL CARE IF:   Pain cannot be controlled with the prescribed medicine.   You have a fever.   Pain continues for longer than your caregiver advises it should.   There is a change in the pain, and you develop chest discomfort or constant abdominal pain.   You feel faint or pass out.  MAKE SURE YOU:   Understand these instructions.   Will watch your condition.   Will get help right away if you are not doing well or get worse.  Document Released: 01/17/2005 Document Revised: 03/29/2011 Document Reviewed: 10/04/2010 [facility] Patient Information 2012 [place], [place].  RESOURCE GUIDE  Dental Problems  Patients with Medicaid: [facility] [phone] [address]  [address] Phone:  [phone]                                                   Phone:  [phone]  If unable to pay or uninsured, contact:  [facility] or [facility]. to become qualified for the adult dental clinic.  Chronic Pain Problems Contact [facility]  [phone] Patients need to be referred by their primary care doctor.  Insufficient Money for Medicine Contact United Way:  call "211" or [facility] [phone].  No Primary Care Doctor Call [facility]  504-480-1492 Other agencies that provide inexpensive medical care     [facility]  [phone]    [facility]  [phone]    [facility]  6103377710    [facility]  [phone]    [facility]  865-529-0320    [facility]  [phone]  Psychological Services [facility]  [phone] [facility]  [phone] [facility]   [phone] (emergency services (819)[phone])  Abuse/Neglect [facility] Child Abuse Hotline 514-445-1848 [facility] Child Abuse Hotline 7184264526 (After Hours)  Emergency Shelter [facility] 8022002234  M[facility] [place] [facility] 586-282-2419 [facility] [phone]  MRSA Hotline #:   (623) 862-3227    [facility] Resources  [facility] [place]  United Way                           [facility] [address] [place]                     [address]         [address]  [place] Phone:  [phone]                                  Phone:  [phone]                   Phone:  [phone]  [facility] Phone:  [phone]  [facility] Child Abuse Hotline 732-879-8542 [phone]) [phone] (After Hours)

## 2011-08-12 NOTE — ED Notes (Signed)
Pt noticed to be a bit lethargic, able to arouse by voice, but not able to stay awake, Dr. Hyman Hopes awared, and wanted to ambulate the patient and offer food. Attempted to offer food but pt wont eat, and unable to ambulate pt as well. Dr. Hyman Hopes would then like to monitor this pt for a little bit longer

## 2011-08-12 NOTE — ED Provider Notes (Addendum)
BP 152/69  Pulse 108  Temp(Src) 98.6 F (37 C) (Oral)  Resp 20  SpO2 99%  Patient signed out from Dr. Read Drivers. CT A/P with nephrolithiasis. Somnolent after infiltration of IV with fentanyl + IV dilaudid. Sleeping in room, mildly tachycardic. Awakens with forceful shake but immediately falls back asleep. Adequate RR and oxygen saturation. Will continue to monitor.  Forbes Cellar, MD 08/12/11 1009  Patient continues to remain somnolent, easily arousable per nursing staff, urinated in bed. Continue to monitor. BP 128/66  Pulse 102  Temp(Src) 98.6 F (37 C) (Oral)  Resp 16  SpO2 96%   Forbes Cellar, MD 08/12/11 1121  Re- Evaluation of patient. She is more alert than previous but still sleeping. Nursing staff advised to feed, ambulate.  Forbes Cellar, MD 08/12/11 1432  Patient ambulatory and walking independently to bathroom per ED tech staff. She remains sleepy. They will attempt to call family members to pick her up.  Forbes Cellar, MD 08/12/11 [phone]

## 2011-08-12 NOTE — ED Provider Notes (Signed)
51yo F, presented last night to ED c/o flank pain, dx ureteral stone.  Pt given pain meds and anti-emetics and fell asleep.  Pt apparently has had intermittent low BP's, responsive to IVF, continued lethargic throughout the day.  Tmax to 100.6.  IVF bolus again given for SBP 80 with SBP increasing to 100's.  Will repeat labs and admit.  8:33 PM:  T/C to [facility] Dr. Lovell Sheehan, case discussed, including:  HPI, pertinent PM/SHx, VS/PE, dx testing, ED course and treatment:  Agreeable to admit, requests to obtain tele bed to team 3.   Laray Anger, DO 08/12/11 2033

## 2011-08-12 NOTE — H&P (Signed)
DATE OF ADMISSION:  08/12/2011  PCP:  Pcp Not In System   Chief Complaint:     HPI: Kelli Jenkins is an 51 y.o. female who presented to the Ed 1 day ago due to complaints of severe left flank pain and hematuria, she was evaluated and underwent a CT scan of the ABD and Pelvis and was found to have a Left Ureteral stone.  She was kept in the ED in an attempt to control her pain, and later she developed fevers and chills and hypotension.  A urine culture was ordered and she was administered IV rocephin and referred for medical admission.       Past Medical History  Diagnosis Date  . Cancer     Past Surgical History  Procedure Date  . Abdominal hysterectomy     Medications:  HOME MEDS: Prior to Admission medications   Medication Sig Start Date End Date Taking? Authorizing Provider  penicillin v potassium (VEETID) 500 MG tablet Take 1 tablet (500 mg total) by mouth 4 (four) times daily. 08/02/11 08/12/11 Yes Vida Roller, MD  ondansetron (ZOFRAN ODT) 8 MG disintegrating tablet Take 1 tablet (8 mg total) by mouth every 8 (eight) hours as needed for nausea. 08/12/11 08/19/11  Carlisle Beers Molpus, MD  oxyCODONE-acetaminophen (PERCOCET) 5-325 MG per tablet Take 1 tablet by mouth every 4 (four) hours as needed for pain. May take 2 tablets PO q 6 hours for severe pain - Do not take with Tylenol as this tablet already contains tylenol 08/02/11 08/12/11  Vida Roller, MD  oxyCODONE-acetaminophen (PERCOCET) 5-325 MG per tablet Take 1 tablet by mouth every 4 (four) hours as needed for pain. 08/12/11 08/22/11  Forbes Cellar, MD  Tamsulosin HCl (FLOMAX) 0.4 MG CAPS Take 1 capsule (0.4 mg total) by mouth daily. 08/12/11   Forbes Cellar, MD    Allergies:  Allergies  Allergen Reactions  . Darvocet (Propoxacet-N) Swelling  . Ibuprofen Swelling    Social History:  Tobacco 1 ppd.    reports that she has been smoking Cigarettes.  She does not have any smokeless tobacco history on file. She reports that she  drinks alcohol. She reports that she does not use illicit drugs.  Family History: History reviewed. No pertinent family history.  Review of Systems: Positive for  Fever,  abdominal pain, The patient denies anorexia, weight loss, vision loss, decreased hearing, hoarseness, chest pain, syncope, dyspnea on exertion, peripheral edema, balance deficits, hemoptysis, melena, hematochezia, severe indigestion/heartburn, hematuria, incontinence, genital sores, muscle weakness, suspicious skin lesions, transient blindness, difficulty walking, depression, unusual weight change, abnormal bleeding, enlarged lymph nodes, angioedema, and breast masses.   Physical Exam:  GEN:  Pleasant  51 year old obese Caucasian female  examined and in discomfort but no acute distress; cooperative with exam Filed Vitals:   08/12/11 1842 08/12/11 1843 08/12/11 1944 08/12/11 2201  BP:   102/65 113/69  Pulse:   94 112  Temp: 100.1 F (37.8 C) 100.6 F (38.1 C) 99.5 F (37.5 C) 100.2 F (37.9 C)  TempSrc: Oral Oral Oral Oral  Resp:   16 20  Height:    5\' 8"  (1.727 m)  Weight:    89.54 kg (197 lb 6.4 oz)  SpO2:   98% 93%   Blood pressure 113/69, pulse 112, temperature 100.2 F (37.9 C), temperature source Oral, resp. rate 20, height 5\' 8"  (1.727 m), weight 89.54 kg (197 lb 6.4 oz), SpO2 93.00%. PSYCH: She is alert and oriented x4; does  not appear anxious does not appear depressed; affect is normal HEENT: Normocephalic and Atraumatic, Mucous membranes pink; PERRLA; EOM intact; Fundi:  Benign;  No scleral icterus, Nares: Patent, Oropharynx: Clear, Neck:  FROM, no cervical lymphadenopathy nor thyromegaly or carotid bruit; no JVD; Breasts:: Not examined CHEST WALL: No tenderness CHEST: Normal respiration, clear to auscultation bilaterally HEART: Regular rate and rhythm; no murmurs rubs or gallops BACK: No kyphosis or scoliosis; no CVA tenderness ABDOMEN: Positive Bowel Sounds, Obese, soft non-tender; no masses, no  organomegaly. Rectal Exam: Not done EXTREMITIES: No bone or joint deformity; age-appropriate arthropathy of the hands and knees; no cyanosis, clubbing or edema; no ulcerations. Genitalia: not examined PULSES: 2+ and symmetric SKIN: Normal hydration no rash or ulceration CNS: Cranial nerves 2-12 grossly intact no focal neurologic deficit   Labs & Imaging Results for orders placed during the hospital encounter of 08/12/11 (from the past 48 hour(s))  CBC     Status: Abnormal   Collection Time   08/12/11  4:15 AM      Component Value Range Comment   WBC 12.1 (*) 4.0 - 10.5 (K/uL)    RBC 4.30  3.87 - 5.11 (MIL/uL)    Hemoglobin 14.0  12.0 - 15.0 (g/dL)    HCT 16.1  09.6 - 04.5 (%)    MCV 95.6  78.0 - 100.0 (fL)    MCH 32.6  26.0 - 34.0 (pg)    MCHC 34.1  30.0 - 36.0 (g/dL)    RDW 40.9  81.1 - 91.4 (%)    Platelets 264  150 - 400 (K/uL)   DIFFERENTIAL     Status: Abnormal   Collection Time   08/12/11  4:15 AM      Component Value Range Comment   Neutrophils Relative 76  43 - 77 (%)    Neutro Abs 9.2 (*) 1.7 - 7.7 (K/uL)    Lymphocytes Relative 18  12 - 46 (%)    Lymphs Abs 2.2  0.7 - 4.0 (K/uL)    Monocytes Relative 5  3 - 12 (%)    Monocytes Absolute 0.6  0.1 - 1.0 (K/uL)    Eosinophils Relative 1  0 - 5 (%)    Eosinophils Absolute 0.1  0.0 - 0.7 (K/uL)    Basophils Relative 0  0 - 1 (%)    Basophils Absolute 0.0  0.0 - 0.1 (K/uL)   BASIC METABOLIC PANEL     Status: Abnormal   Collection Time   08/12/11  4:15 AM      Component Value Range Comment   Sodium 138  135 - 145 (mEq/L)    Potassium 3.6  3.5 - 5.1 (mEq/L)    Chloride 101  96 - 112 (mEq/L)    CO2 28  19 - 32 (mEq/L)    Glucose, Bld 115 (*) 70 - 99 (mg/dL)    BUN 18  6 - 23 (mg/dL)    Creatinine, Ser 7.82  0.50 - 1.10 (mg/dL)    Calcium 9.5  8.4 - 10.5 (mg/dL)    GFR calc non Af Amer 59 (*) >90 (mL/min)    GFR calc Af Amer 69 (*) >90 (mL/min)   URINALYSIS, ROUTINE W REFLEX MICROSCOPIC     Status: Abnormal    Collection Time   08/12/11  4:31 AM      Component Value Range Comment   Color, Urine YELLOW  YELLOW     APPearance CLOUDY (*) CLEAR     Specific Gravity, Urine 1.024  1.005 - 1.030     pH 6.0  5.0 - 8.0     Glucose, UA NEGATIVE  NEGATIVE (mg/dL)    Hgb urine dipstick LARGE (*) NEGATIVE     Bilirubin Urine NEGATIVE  NEGATIVE     Ketones, ur NEGATIVE  NEGATIVE (mg/dL)    Protein, ur NEGATIVE  NEGATIVE (mg/dL)    Urobilinogen, UA 0.2  0.0 - 1.0 (mg/dL)    Nitrite NEGATIVE  NEGATIVE     Leukocytes, UA SMALL (*) NEGATIVE    URINE MICROSCOPIC-ADD ON     Status: Abnormal   Collection Time   08/12/11  4:31 AM      Component Value Range Comment   Squamous Epithelial / LPF RARE  RARE     WBC, UA 3-6  <3 (WBC/hpf)    RBC / HPF TOO NUMEROUS TO COUNT  <3 (RBC/hpf)    Bacteria, UA FEW (*) RARE     Urine-Other LESS THAN 10 mL OF URINE SUBMITTED     CBC     Status: Abnormal   Collection Time   08/12/11  8:30 PM      Component Value Range Comment   WBC 26.3 (*) 4.0 - 10.5 (K/uL)    RBC 3.83 (*) 3.87 - 5.11 (MIL/uL)    Hemoglobin 12.5  12.0 - 15.0 (g/dL)    HCT 16.1  09.6 - 04.5 (%)    MCV 94.8  78.0 - 100.0 (fL)    MCH 32.6  26.0 - 34.0 (pg)    MCHC 34.4  30.0 - 36.0 (g/dL)    RDW 40.9  81.1 - 91.4 (%)    Platelets 185  150 - 400 (K/uL)   DIFFERENTIAL     Status: Abnormal   Collection Time   08/12/11  8:30 PM      Component Value Range Comment   Neutrophils Relative 92 (*) 43 - 77 (%)    Lymphocytes Relative 3 (*) 12 - 46 (%)    Monocytes Relative 5  3 - 12 (%)    Eosinophils Relative 0  0 - 5 (%)    Basophils Relative 0  0 - 1 (%)    Neutro Abs 24.2 (*) 1.7 - 7.7 (K/uL)    Lymphs Abs 0.8  0.7 - 4.0 (K/uL)    Monocytes Absolute 1.3 (*) 0.1 - 1.0 (K/uL)    Eosinophils Absolute 0.0  0.0 - 0.7 (K/uL)    Basophils Absolute 0.0  0.0 - 0.1 (K/uL)    WBC Morphology INCREASED BANDS (>20% BANDS)     BASIC METABOLIC PANEL     Status: Abnormal   Collection Time   08/12/11  8:30 PM       Component Value Range Comment   Sodium 139  135 - 145 (mEq/L)    Potassium 3.4 (*) 3.5 - 5.1 (mEq/L)    Chloride 104  96 - 112 (mEq/L)    CO2 25  19 - 32 (mEq/L)    Glucose, Bld 106 (*) 70 - 99 (mg/dL)    BUN 16  6 - 23 (mg/dL)    Creatinine, Ser 7.82 (*) 0.50 - 1.10 (mg/dL)    Calcium 8.9  8.4 - 10.5 (mg/dL)    GFR calc non Af Amer 48 (*) >90 (mL/min)    GFR calc Af Amer 55 (*) >90 (mL/min)   LACTIC ACID, PLASMA     Status: Normal   Collection Time   08/12/11  8:30 PM      Component Value  Range Comment   Lactic Acid, Venous 1.8  0.5 - 2.2 (mmol/L)   PROCALCITONIN     Status: Normal   Collection Time   08/12/11  8:30 PM      Component Value Range Comment   Procalcitonin 70.63      Ct Abdomen Pelvis Wo Contrast  08/12/2011  *RADIOLOGY REPORT*  Clinical Data: Left flank and left lower quadrant abdominal pain. Sudden onset.  Vomiting.  CT ABDOMEN AND PELVIS WITHOUT CONTRAST  Technique:  Multidetector CT imaging of the abdomen and pelvis was performed following the standard protocol without intravenous contrast.  Comparison: 06/03/2006  Findings: The lung bases are clear.  There is a 3 mm stone in the distal left ureter just above the ureterovesicle junction with proximal ureterectasis and pyelocaliectasis.  Minimal periureteral stranding.  Changes are consistent with mild to moderate obstruction.  No additional renal, ureteral, or bladder stones are visualized.  The right ureter and collecting system are decompressed.  The bladder is decompressed.  The unenhanced appearance of the liver, spleen, gallbladder, pancreas, adrenal glands, abdominal aorta, retroperitoneal lymph nodes, stomach, small bowel, and colon is unremarkable.  No free air or free fluid in the abdomen.  Pelvis:  No free or loculated pelvic fluid collections.  No abnormal adnexal masses.  Surgical absence of the uterus.  The appendix is normal.  Degenerative changes in the lumbar spine.  IMPRESSION: 3 mm distal left ureteral stone  causing mild to moderate proximal obstruction.  Original Report Authenticated By: Marlon Pel, M.D.      Assessment: Present on Admission:  .Pyelonephritis .Ureteral colic .Hypotension .Fever  Leukocytosis  Nephrolithiasis  Tobacco Use Disorder     Plan:  Admit to Telemetry Bed Urine C+S sent and IV Rocephin ordered Pain control IVFs for Fluid resuscitation Antipyretics PRN.   DVT prophylaxis Tobacco Cessation Counseling Social work Consult due to ?Homelessness Other plans as per orders.    CODE STATUS:      FULL CODE        Jaimee Corum C 08/12/2011, 11:42 PM

## 2011-08-12 NOTE — ED Notes (Signed)
CSW met with pt per request of RN. RN informed Clinical research associate that pt has recently lost her apartment, means of transportation, and has no where to go upon discharge. Pt informed Clinical research associate that she has never utilized a shelter before but would like assistance. CSW contacted [facility] but was informed that there were no female beds available tonight. CSW contacted [facility] who informed writer that pt could come there for the night. CSW provided travel assistance to transport pt. No other concerns verbalized by Clinical research associate at this time.

## 2011-08-12 NOTE — ED Notes (Signed)
Upon entering room to do hourly rounding, pt found to be lying on prone position, without pants, blankets on the floor, and a significant amount of urine on the floor. Pt able to wake up upon calling her name, arousable, able to follow command, Good oxygen saturation noted

## 2011-08-12 NOTE — ED Notes (Signed)
Pt ambulated to the restroom with no assistance. When finished ambulating pt goes back to bed to sleep. Pt states " I haven't slept in days".

## 2011-08-12 NOTE — ED Notes (Signed)
Awaiting md to see pt prior to transfer to floor

## 2011-08-12 NOTE — ED Notes (Signed)
Pt sts she feels slightly constipated but unable to adequately use restroom. Patient attempting to use restroom but is unable to produce results. Patient unable to stay still for assessment. Sts pain is in her left lower abdomen.

## 2011-08-12 NOTE — ED Notes (Signed)
Pt c/o L flank and LLQ abdominal pain sudden onset w/ vomiting x 2 hrs

## 2011-08-12 NOTE — ED Provider Notes (Signed)
History     CSN: [id number]  Arrival date & time 08/12/11  0401   First MD Initiated Contact with Patient 08/12/11 [phone]      Chief Complaint  Patient presents with  . Flank Pain    (Consider location/radiation/quality/duration/timing/severity/associated sxs/prior treatment) HPI Level 5 Caveat: severe pain This is a 51 year old white female who had the sudden onset of left flank pain radiating to the left lower quadrant. Its onset was just prior to arrival. The symptoms are described as severe. The pain is accompanied by nausea and vomiting as well as urinary urgency. The pain is described as a burning and a sensation that her insides are being twisted. There is some exacerbation of pain with movement but the pain is present even when still. She denies fever or chills. She denies past history of kidney stone.  Past Medical History  Diagnosis Date  . Cancer     Past Surgical History  Procedure Date  . Abdominal hysterectomy     History reviewed. No pertinent family history.  History  Substance Use Topics  . Smoking status: Current Everyday Smoker    Types: Cigarettes  . Smokeless tobacco: Not on file  . Alcohol Use: Yes     occasionally    OB History    Grav Para Term Preterm Abortions TAB SAB Ect Mult Living                  Review of Systems  Unable to perform ROS   Allergies  Darvocet and Ibuprofen  Home Medications   Current Outpatient Rx  Name Route Sig Dispense Refill  . PENICILLIN V POTASSIUM 500 MG PO TABS Oral Take 1 tablet (500 mg total) by mouth 4 (four) times daily. 40 tablet 0  . OXYCODONE-ACETAMINOPHEN 5-325 MG PO TABS Oral Take 1 tablet by mouth every 4 (four) hours as needed for pain. May take 2 tablets PO q 6 hours for severe pain - Do not take with Tylenol as this tablet already contains tylenol 15 tablet 0    BP 121/67  Pulse 95  Temp(Src) 98.6 F (37 C) (Oral)  Resp 20  SpO2 100%  Physical Exam General: Well-developed,  well-nourished female in obvious discomfort; appearance consistent with age of record HENT: normocephalic, atraumatic Eyes: pupils equal round and reactive to light; extraocular muscles intact Neck: supple Heart: regular rate and rhythm Lungs: clear to auscultation bilaterally Abdomen: soft; nondistended; mild left lower quadrant tenderness GU: Mild left flank tenderness Extremities: No deformity; full range of motion Neurologic: Awake, alert and oriented; motor function intact in all extremities and symmetric; no facial droop Skin: Warm and dry Psychiatric: Agitated, anxious    ED Course  Procedures (including critical care time)     MDM   Nursing notes and vitals signs, including pulse oximetry, reviewed.  Summary of this visit's results, reviewed by myself:  Labs:  Results for orders placed during the hospital encounter of 08/12/11  CBC      Component Value Range   WBC 12.1 (*) 4.0 - 10.5 (K/uL)   RBC 4.30  3.87 - 5.11 (MIL/uL)   Hemoglobin 14.0  12.0 - 15.0 (g/dL)   HCT 29.5  62.1 - 30.8 (%)   MCV 95.6  78.0 - 100.0 (fL)   MCH 32.6  26.0 - 34.0 (pg)   MCHC 34.1  30.0 - 36.0 (g/dL)   RDW 65.7  84.6 - 96.2 (%)   Platelets 264  150 - 400 (K/uL)  DIFFERENTIAL  Component Value Range   Neutrophils Relative 76  43 - 77 (%)   Neutro Abs 9.2 (*) 1.7 - 7.7 (K/uL)   Lymphocytes Relative 18  12 - 46 (%)   Lymphs Abs 2.2  0.7 - 4.0 (K/uL)   Monocytes Relative 5  3 - 12 (%)   Monocytes Absolute 0.6  0.1 - 1.0 (K/uL)   Eosinophils Relative 1  0 - 5 (%)   Eosinophils Absolute 0.1  0.0 - 0.7 (K/uL)   Basophils Relative 0  0 - 1 (%)   Basophils Absolute 0.0  0.0 - 0.1 (K/uL)  BASIC METABOLIC PANEL      Component Value Range   Sodium 138  135 - 145 (mEq/L)   Potassium 3.6  3.5 - 5.1 (mEq/L)   Chloride 101  96 - 112 (mEq/L)   CO2 28  19 - 32 (mEq/L)   Glucose, Bld 115 (*) 70 - 99 (mg/dL)   BUN 18  6 - 23 (mg/dL)   Creatinine, Ser 1.61  0.50 - 1.10 (mg/dL)   Calcium  9.5  8.4 - 10.5 (mg/dL)   GFR calc non Af Amer 59 (*) >90 (mL/min)   GFR calc Af Amer 69 (*) >90 (mL/min)  URINALYSIS, ROUTINE W REFLEX MICROSCOPIC      Component Value Range   Color, Urine YELLOW  YELLOW    APPearance CLOUDY (*) CLEAR    Specific Gravity, Urine 1.024  1.005 - 1.030    pH 6.0  5.0 - 8.0    Glucose, UA NEGATIVE  NEGATIVE (mg/dL)   Hgb urine dipstick LARGE (*) NEGATIVE    Bilirubin Urine NEGATIVE  NEGATIVE    Ketones, ur NEGATIVE  NEGATIVE (mg/dL)   Protein, ur NEGATIVE  NEGATIVE (mg/dL)   Urobilinogen, UA 0.2  0.0 - 1.0 (mg/dL)   Nitrite NEGATIVE  NEGATIVE    Leukocytes, UA SMALL (*) NEGATIVE   URINE MICROSCOPIC-ADD ON      Component Value Range   Squamous Epithelial / LPF RARE  RARE    WBC, UA 3-6  <3 (WBC/hpf)   RBC / HPF TOO NUMEROUS TO COUNT  <3 (RBC/hpf)   Bacteria, UA FEW (*) RARE    Urine-Other LESS THAN 10 mL OF URINE SUBMITTED      Imaging Studies: Ct Abdomen Pelvis Wo Contrast  08/12/2011  *RADIOLOGY REPORT*  Clinical Data: Left flank and left lower quadrant abdominal pain. Sudden onset.  Vomiting.  CT ABDOMEN AND PELVIS WITHOUT CONTRAST  Technique:  Multidetector CT imaging of the abdomen and pelvis was performed following the standard protocol without intravenous contrast.  Comparison: 06/03/2006  Findings: The lung bases are clear.  There is a 3 mm stone in the distal left ureter just above the ureterovesicle junction with proximal ureterectasis and pyelocaliectasis.  Minimal periureteral stranding.  Changes are consistent with mild to moderate obstruction.  No additional renal, ureteral, or bladder stones are visualized.  The right ureter and collecting system are decompressed.  The bladder is decompressed.  The unenhanced appearance of the liver, spleen, gallbladder, pancreas, adrenal glands, abdominal aorta, retroperitoneal lymph nodes, stomach, small bowel, and colon is unremarkable.  No free air or free fluid in the abdomen.  Pelvis:  No free or loculated  pelvic fluid collections.  No abnormal adnexal masses.  Surgical absence of the uterus.  The appendix is normal.  Degenerative changes in the lumbar spine.  IMPRESSION: 3 mm distal left ureteral stone causing mild to moderate proximal obstruction.  Original Report Authenticated  By: Marlon Pel, M.D.   8:01 AM Initial administration of IV Zofran and fentanyl not adequately treating pain. It is suspected that her IV infiltrated. Nursing staff has started a new IV and administered additional Phenergan and Dilantin. She is currently somnolent; she is on the cardiac monitor, pulse oximetry and being given oxygen by nasal cannula. She'll be reevaluated by Dr. Hyman Hopes who will make disposition.           Hanley Seamen, MD 08/12/11 [phone]

## 2011-08-13 ENCOUNTER — Encounter (HOSPITAL_COMMUNITY): Payer: Self-pay | Admitting: Internal Medicine

## 2011-08-13 DIAGNOSIS — R6510 Systemic inflammatory response syndrome (SIRS) of non-infectious origin without acute organ dysfunction: Secondary | ICD-10-CM

## 2011-08-13 LAB — CBC
HCT: 34.3 % — ABNORMAL LOW (ref 36.0–46.0)
Hemoglobin: 11.7 g/dL — ABNORMAL LOW (ref 12.0–15.0)
RBC: 3.6 MIL/uL — ABNORMAL LOW (ref 3.87–5.11)

## 2011-08-13 LAB — BASIC METABOLIC PANEL
Chloride: 105 mEq/L (ref 96–112)
GFR calc Af Amer: 67 mL/min — ABNORMAL LOW (ref >90)
GFR calc non Af Amer: 58 mL/min — ABNORMAL LOW (ref >90)
Potassium: 2.9 mEq/L — ABNORMAL LOW (ref 3.5–5.1)
Sodium: 138 mEq/L (ref 135–145)

## 2011-08-13 LAB — MAGNESIUM: Magnesium: 1.7 mg/dL (ref 1.5–2.5)

## 2011-08-13 MED ORDER — TAMSULOSIN HCL 0.4 MG PO CAPS
0.4000 mg | ORAL_CAPSULE | Freq: Every day | ORAL | Status: DC
  Administered 2011-08-13 – 2011-08-15 (×3): 0.4 mg via ORAL
  Filled 2011-08-13 (×3): qty 1

## 2011-08-13 MED ORDER — POTASSIUM CHLORIDE CRYS ER 20 MEQ PO TBCR
40.0000 meq | EXTENDED_RELEASE_TABLET | Freq: Once | ORAL | Status: AC
  Administered 2011-08-13: 40 meq via ORAL
  Filled 2011-08-13: qty 2

## 2011-08-13 MED ORDER — SODIUM CHLORIDE 0.9 % IJ SOLN
1000.0000 mL | INTRAMUSCULAR | Status: AC
  Administered 2011-08-13: 1000 mL via INTRAVENOUS
  Filled 2011-08-13: qty 1002

## 2011-08-13 MED ORDER — VANCOMYCIN HCL IN DEXTROSE 1-5 GM/200ML-% IV SOLN
1000.0000 mg | Freq: Two times a day (BID) | INTRAVENOUS | Status: DC
  Administered 2011-08-14 – 2011-08-15 (×4): 1000 mg via INTRAVENOUS
  Filled 2011-08-13 (×6): qty 200

## 2011-08-13 MED ORDER — POTASSIUM CHLORIDE 10 MEQ/100ML IV SOLN
10.0000 meq | INTRAVENOUS | Status: AC
  Administered 2011-08-13 (×3): 10 meq via INTRAVENOUS
  Filled 2011-08-13 (×3): qty 100

## 2011-08-13 NOTE — Progress Notes (Addendum)
Pt was seen and examined at bedside. I have reviewed labs and vitals which are stable and pt is hemodynamically stable. I agree with the above's assessment and plan. Will reassess in AM. If persistent fever and pain, may need urology consult. Broaden the spectrum of abx given high procalcitonin and persistent fever on Ceftriaxone alone.  Debbora Presto  [facility], pager #: [phone] Main office number: [phone]

## 2011-08-13 NOTE — Progress Notes (Signed)
Subjective: In bed with covers over head. Respers non labored. Easily aroused. Reports pain in abdomen. Denies dysuria, frequency.  Objective: Vital signs Filed Vitals:   08/12/11 2201 08/13/11 0359 08/13/11 0945 08/13/11 1039  BP: 113/69 105/62 110/65   Pulse: 112 123 120   Temp: 100.2 F (37.9 C) 100.7 F (38.2 C) 102.9 F (39.4 C) 100.1 F (37.8 C)  TempSrc: Oral Oral Oral Oral  Resp: 20 20 32   Height: 5\' 8"  (1.727 m)     Weight: 89.54 kg (197 lb 6.4 oz)     SpO2: 93% 92% 95%    Weight change:  Last BM Date: 08/10/11  Intake/Output from previous day: 04/21 0701 - 04/22 0700 In: -  Out: 1300 [Urine:1300] Total I/O In: 480 [P.O.:480] Out: 1300 [Urine:1300]   Physical Exam: General: Drowsy but easily aroused, oriented x3, in no acute distress. HEENT: No bruits, no goiter. Mucus membranes mouth dry/pink. PERRL  Heart: tachycardic but sinus rhythm, without murmurs, rubs, gallops. Lungs: Normal effort. Breath sounds Clear to auscultation bilaterally. No wheeze Abdomen: Obese,  Soft, nontender, nondistended, positive bowel sounds. Extremities: No clubbing cyanosis or edema with positive pedal pulses. Neuro: Grossly intact, nonfocal. Cranial nerve II-XII intact.    Lab Results: Basic Metabolic Panel:  Basename 08/13/11 0428 08/12/11 2030  NA 138 139  K 2.9* 3.4*  CL 105 104  CO2 22 25  GLUCOSE 102* 106*  BUN 13 16  CREATININE 1.08 1.27*  CALCIUM 8.7 8.9  MG 1.7 --  PHOS -- --   Liver Function Tests: No results found for this basename: AST:2,ALT:2,ALKPHOS:2,BILITOT:2,PROT:2,ALBUMIN:2 in the last 72 hours No results found for this basename: LIPASE:2,AMYLASE:2 in the last 72 hours No results found for this basename: AMMONIA:2 in the last 72 hours CBC:  Basename 08/13/11 0428 08/12/11 2030 08/12/11 0415  WBC 18.6* 26.3* --  NEUTROABS -- 24.2* 9.2*  HGB 11.7* 12.5 --  HCT 34.3* 36.3 --  MCV 95.3 94.8 --  PLT 167 185 --   Cardiac Enzymes: No results found  for this basename: CKTOTAL:3,CKMB:3,CKMBINDEX:3,TROPONINI:3 in the last 72 hours BNP: No results found for this basename: PROBNP:3 in the last 72 hours D-Dimer: No results found for this basename: DDIMER:2 in the last 72 hours CBG: No results found for this basename: GLUCAP:6 in the last 72 hours Hemoglobin A1C: No results found for this basename: HGBA1C in the last 72 hours Fasting Lipid Panel: No results found for this basename: CHOL,HDL,LDLCALC,TRIG,CHOLHDL,LDLDIRECT in the last 72 hours Thyroid Function Tests: No results found for this basename: TSH,T4TOTAL,FREET4,T3FREE,THYROIDAB in the last 72 hours Anemia Panel: No results found for this basename: VITAMINB12,FOLATE,FERRITIN,TIBC,IRON,RETICCTPCT in the last 72 hours Coagulation: No results found for this basename: LABPROT:2,INR:2 in the last 72 hours Urine Drug Screen: Drugs of Abuse     Component Value Date/Time   LABOPIA NONE DETECTED 02/01/2011 0800   LABOPIA NEG 05/03/2010 0130   COCAINSCRNUR POSITIVE* 02/01/2011 0800   COCAINSCRNUR POS* 05/03/2010 0130   LABBENZ NONE DETECTED 02/01/2011 0800   LABBENZ NEG 05/03/2010 0130   AMPHETMU NONE DETECTED 02/01/2011 0800   AMPHETMU NEG 05/03/2010 0130   THCU NONE DETECTED 02/01/2011 0800   LABBARB NONE DETECTED 02/01/2011 0800    Alcohol Level: No results found for this basename: ETH:2 in the last 72 hours Urinalysis:  Basename 08/12/11 0431  COLORURINE YELLOW  LABSPEC 1.024  PHURINE 6.0  GLUCOSEU NEGATIVE  HGBUR LARGE*  BILIRUBINUR NEGATIVE  KETONESUR NEGATIVE  PROTEINUR NEGATIVE  UROBILINOGEN 0.2  NITRITE NEGATIVE  LEUKOCYTESUR SMALL*   Misc. Labs:  No results found for this or any previous visit (from the past 240 hour(s)).  Studies/Results: Ct Abdomen Pelvis Wo Contrast  08/12/2011  *RADIOLOGY REPORT*  Clinical Data: Left flank and left lower quadrant abdominal pain. Sudden onset.  Vomiting.  CT ABDOMEN AND PELVIS WITHOUT CONTRAST  Technique:  Multidetector CT  imaging of the abdomen and pelvis was performed following the standard protocol without intravenous contrast.  Comparison: 06/03/2006  Findings: The lung bases are clear.  There is a 3 mm stone in the distal left ureter just above the ureterovesicle junction with proximal ureterectasis and pyelocaliectasis.  Minimal periureteral stranding.  Changes are consistent with mild to moderate obstruction.  No additional renal, ureteral, or bladder stones are visualized.  The right ureter and collecting system are decompressed.  The bladder is decompressed.  The unenhanced appearance of the liver, spleen, gallbladder, pancreas, adrenal glands, abdominal aorta, retroperitoneal lymph nodes, stomach, small bowel, and colon is unremarkable.  No free air or free fluid in the abdomen.  Pelvis:  No free or loculated pelvic fluid collections.  No abnormal adnexal masses.  Surgical absence of the uterus.  The appendix is normal.  Degenerative changes in the lumbar spine.  IMPRESSION: 3 mm distal left ureteral stone causing mild to moderate proximal obstruction.  Original Report Authenticated By: Marlon Pel, M.D.    Medications: Scheduled Meds:   . cefTRIAXone (ROCEPHIN)  IV  1 g Intravenous Q24H  . enoxaparin  40 mg Subcutaneous Q24H  . potassium chloride  10 mEq Intravenous Q1 Hr x 3  . sodium chloride  500 mL Intravenous Once  . sodium chloride  1,000 mL Intravenous NOW  . sodium chloride  3 mL Intravenous Q12H  . Tamsulosin HCl  0.4 mg Oral Daily  . DISCONTD: cefTRIAXone (ROCEPHIN)  IV  1 g Intravenous Q24H   Continuous Infusions:   . sodium chloride 125 mL/hr at 08/13/11 0505  . DISCONTD: sodium chloride 1,000 mL (08/13/11 1233)  . DISCONTD: sodium chloride 125 mL/hr at 08/12/11 0528  . DISCONTD: sodium chloride 125 mL/hr at 08/12/11 2046   PRN Meds:.acetaminophen, acetaminophen, alum & mag hydroxide-simeth, HYDROmorphone, ondansetron (ZOFRAN) IV, ondansetron, oxyCODONE,  zolpidem  Assessment/Plan:  Principal Problem:  *Pyelonephritis: Rocephin day #2. Abdominial CT and urinalysis as above. Will check urine culture. Pain improved.  Active Problems:  Ureteral colic: related to #1 and #5. Provide pain meds. Will start flomax . Strict I&O's. Pain meds  Hypotension: somewhat improved. Will give 1000 NS bolus and keep fluids at 125/hr. Monitor q4 hours.   Fever: related to #1. Will get blood cultures and urine cultures. Continue rocephin.   Nephrolithiasis: CT as above. Will strain urine. Provide flomax. If no improvement, will consider GI consult in am.   Leukocytosis: related to #1. Check cultures. Continue rocephin.   Tobacco use disorder: tobacco cessation education discussed.  SIRS: WC 18. Tachycardic, hypotensive, fever. Will provide fluid support, blood cultures. Urine culture pending. Monitor closely. Hypokalemia: will replete and recheck. Mg level 1.7.  Dispo: home with medically stable. Probably 2-3 days.     LOS: 1 day   [facility] M 08/13/2011, 1:41 PM

## 2011-08-13 NOTE — Progress Notes (Signed)
   CARE MANAGEMENT NOTE 08/13/2011  Patient:  Kelli Jenkins, Kelli Jenkins   Account Number:  [id number]  Date Initiated:  08/13/2011  Documentation initiated by:  Jiles Crocker  Subjective/Objective Assessment:   ADMITTED WITH PYELONEPHRITIS     Action/Plan:   INDEPENDENT PRIOR TO ADMISSION, HOMELESS ISSUES; SOC WORKER REFERRAL PLACED   Anticipated DC Date:  08/20/2011   Anticipated DC Plan:  SHELTER  In-house referral  Clinical Social Worker  Artist                   Status of service:  In process, will continue to follow Medicare Important Message given?  NA - LOS <3 / Initial given by admissions (If response is "NO", the following Medicare IM given date fields will be blank)   Per UR Regulation:  Reviewed for med. necessity/level of care/duration of stay   Comments:  08/13/2011- B Lyndall Windt RN, BSN, MHA

## 2011-08-13 NOTE — Progress Notes (Signed)
ANTIBIOTIC CONSULT NOTE - INITIAL  Pharmacy Consult for Vancomycin Indication: Sepsis  Allergies  Allergen Reactions  . Darvocet (Propoxacet-N) Swelling  . Ibuprofen Swelling    Patient Measurements: Height: 5\' 8"  (172.7 cm) Weight: 197 lb 6.4 oz (89.54 kg) IBW/kg (Calculated) : 63.9  Adjusted Body Weight:   Vital Signs: Temp: 103 F (39.4 C) (04/22 2112) Temp src: Oral (04/22 2112) BP: 93/57 mmHg (04/22 2112) Pulse Rate: 63  (04/22 2112) Intake/Output from previous day: 04/21 0701 - 04/22 0700 In: 100 [IV Piggyback:100] Out: 1300 [Urine:1300] Intake/Output from this shift: Total I/O In: -  Out: 800 [Urine:800]  Labs:  [facility] 08/13/11 0428 08/12/11 2030 08/12/11 0415  WBC 18.6* 26.3* 12.1*  HGB 11.7* 12.5 14.0  PLT 167 185 264  LABCREA -- -- --  CREATININE 1.08 1.27* 1.06   Estimated Creatinine Clearance: 71.3 ml/min (by C-G formula based on Cr of 1.08). No results found for this basename: VANCOTROUGH:2,VANCOPEAK:2,VANCORANDOM:2,GENTTROUGH:2,GENTPEAK:2,GENTRANDOM:2,TOBRATROUGH:2,TOBRAPEAK:2,TOBRARND:2,AMIKACINPEAK:2,AMIKACINTROU:2,AMIKACIN:2, in the last 72 hours   Microbiology: Recent Results (from the past 720 hour(s))  URINE CULTURE     Status: Normal (Preliminary result)   Collection Time   08/12/11  4:31 AM      Component Value Range Status Comment   Specimen Description URINE, CLEAN CATCH   Final    Special Requests Normal   Final    Culture  Setup Time [id number]   Final    Colony Count >=100,000 COLONIES/ML   Final    Culture ESCHERICHIA COLI   Final    Report Status PENDING   Incomplete     Medical History: Past Medical History  Diagnosis Date  . Cancer   . SIRS (systemic inflammatory response syndrome)   . Hypokalemia   . Tobacco abuse   . Nephrolithiasis   . Pyelonephritis     Medications:  Anti-infectives     Start     Dose/Rate Route Frequency Ordered Stop   08/13/11 2300   vancomycin (VANCOCIN) IVPB 1000 mg/200 mL premix          1,000 mg 200 mL/hr over 60 Minutes Intravenous Every 12 hours 08/13/11 2223     08/13/11 2000   cefTRIAXone (ROCEPHIN) 1 g in dextrose 5 % 50 mL IVPB  Status:  Discontinued        1 g 100 mL/hr over 30 Minutes Intravenous Every 24 hours 08/12/11 2103 08/12/11 2129   08/12/11 2129   cefTRIAXone (ROCEPHIN) 1 g in dextrose 5 % 50 mL IVPB        1 g 100 mL/hr over 30 Minutes Intravenous Every 24 hours 08/12/11 2129           Assessment: Patient with sepsis  Goal of Therapy:  Vancomycin trough level 15-20 mcg/ml  Plan:  Measure antibiotic drug levels at steady state Follow up culture results Vancomycin 1gm iv q12hr  Aleene Davidson Crowford 08/13/2011,10:24 PM

## 2011-08-14 LAB — BASIC METABOLIC PANEL
BUN: 7 mg/dL (ref 6–23)
CO2: 24 mEq/L (ref 19–32)
Calcium: 8.7 mg/dL (ref 8.4–10.5)
Chloride: 104 mEq/L (ref 96–112)
Creatinine, Ser: 1.06 mg/dL (ref 0.50–1.10)
GFR calc Af Amer: 69 mL/min — ABNORMAL LOW (ref >90)
GFR calc non Af Amer: 59 mL/min — ABNORMAL LOW (ref >90)
Glucose, Bld: 141 mg/dL — ABNORMAL HIGH (ref 70–99)
Potassium: 3.9 mEq/L (ref 3.5–5.1)
Sodium: 134 mEq/L — ABNORMAL LOW (ref 135–145)

## 2011-08-14 LAB — CBC
HCT: 34 % — ABNORMAL LOW (ref 36.0–46.0)
Hemoglobin: 11.3 g/dL — ABNORMAL LOW (ref 12.0–15.0)
MCV: 96 fL (ref 78.0–100.0)
Platelets: 118 10*3/uL — ABNORMAL LOW (ref 150–400)
RBC: 3.54 MIL/uL — ABNORMAL LOW (ref 3.87–5.11)
WBC: 14.4 10*3/uL — ABNORMAL HIGH (ref 4.0–10.5)

## 2011-08-14 LAB — URINE CULTURE: Colony Count: >=100000

## 2011-08-14 LAB — LACTIC ACID, PLASMA: Lactic Acid, Venous: 0.8 mmol/L (ref 0.5–2.2)

## 2011-08-14 LAB — PROCALCITONIN: Procalcitonin: 30.13 ng/mL

## 2011-08-14 MED ORDER — SODIUM CHLORIDE 0.45 % IV SOLN
INTRAVENOUS | Status: DC
  Administered 2011-08-14 – 2011-08-15 (×3): via INTRAVENOUS

## 2011-08-14 MED ORDER — SODIUM CHLORIDE 0.45 % IV SOLN: INTRAVENOUS | Status: DC

## 2011-08-14 MED ORDER — PIPERACILLIN-TAZOBACTAM 3.375 G IVPB
3.3750 g | Freq: Three times a day (TID) | INTRAVENOUS | Status: DC
  Administered 2011-08-14 – 2011-08-18 (×11): 3.375 g via INTRAVENOUS
  Filled 2011-08-14 (×13): qty 50

## 2011-08-14 NOTE — Progress Notes (Signed)
Subjective: "I have a bad headache and today my neck hurts too". Lying in bed. Somewhat ill appearing.   Objective: Vital signs Filed Vitals:   08/13/11 2112 08/14/11 0001 08/14/11 0214 08/14/11 0533  BP: 93/57  95/65 100/68  Pulse: 63  90 113  Temp: 103 F (39.4 C) 100.2 F (37.9 C) 98.6 F (37 C) 102.6 F (39.2 C)  TempSrc: Oral  Oral Oral  Resp: 22  20 20   Height:      Weight:      SpO2: 99%  96% 96%   Weight change:  Last BM Date: 08/10/11  Intake/Output from previous day: 04/22 0701 - 04/23 0700 In: 4510 [P.O.:960; I.V.:2350; IV Piggyback:200] Out: 5200 [Urine:5200] Total I/O In: 240 [P.O.:240] Out: 1400 [Urine:1400]   Physical Exam: General: Alert, awake, oriented x3, in no acute distress but clearly does not feel well.  HEENT: No bruits, no goiter. Mucus membranes dry/pink.  Heart: Regular rate and rhythm, without murmurs, rubs, gallops. Lungs: Normal effort. Breath sounds clear to auscultation bilaterally. No wheeze Abdomen: Soft, nontender, nondistended, positive bowel sounds. Extremities: No clubbing cyanosis or edema with positive pedal pulses. Neuro: Grossly intact, nonfocal. Speech clear. Cranial nerve II-XII intact.  Skin: warm slightly diaphoretic     Lab Results: Basic Metabolic Panel:  Basename 08/14/11 0450 08/13/11 0428  NA 134* 138  K 3.9 2.9*  CL 104 105  CO2 24 22  GLUCOSE 141* 102*  BUN 7 13  CREATININE 1.06 1.08  CALCIUM 8.7 8.7  MG -- 1.7  PHOS -- --   Liver Function Tests: No results found for this basename: AST:2,ALT:2,ALKPHOS:2,BILITOT:2,PROT:2,ALBUMIN:2 in the last 72 hours No results found for this basename: LIPASE:2,AMYLASE:2 in the last 72 hours No results found for this basename: AMMONIA:2 in the last 72 hours CBC:  Basename 08/14/11 0450 08/13/11 0428 08/12/11 2030 08/12/11 0415  WBC 14.4* 18.6* -- --  NEUTROABS -- -- 24.2* 9.2*  HGB 11.3* 11.7* -- --  HCT 34.0* 34.3* -- --  MCV 96.0 95.3 -- --  PLT 118* 167 --  --   Cardiac Enzymes: No results found for this basename: CKTOTAL:3,CKMB:3,CKMBINDEX:3,TROPONINI:3 in the last 72 hours BNP: No results found for this basename: PROBNP:3 in the last 72 hours D-Dimer: No results found for this basename: DDIMER:2 in the last 72 hours CBG: No results found for this basename: GLUCAP:6 in the last 72 hours Hemoglobin A1C: No results found for this basename: HGBA1C in the last 72 hours Fasting Lipid Panel: No results found for this basename: CHOL,HDL,LDLCALC,TRIG,CHOLHDL,LDLDIRECT in the last 72 hours Thyroid Function Tests: No results found for this basename: TSH,T4TOTAL,FREET4,T3FREE,THYROIDAB in the last 72 hours Anemia Panel: No results found for this basename: VITAMINB12,FOLATE,FERRITIN,TIBC,IRON,RETICCTPCT in the last 72 hours Coagulation: No results found for this basename: LABPROT:2,INR:2 in the last 72 hours Urine Drug Screen: Drugs of Abuse     Component Value Date/Time   LABOPIA NONE DETECTED 02/01/2011 0800   LABOPIA NEG 05/03/2010 0130   COCAINSCRNUR POSITIVE* 02/01/2011 0800   COCAINSCRNUR POS* 05/03/2010 0130   LABBENZ NONE DETECTED 02/01/2011 0800   LABBENZ NEG 05/03/2010 0130   AMPHETMU NONE DETECTED 02/01/2011 0800   AMPHETMU NEG 05/03/2010 0130   THCU NONE DETECTED 02/01/2011 0800   LABBARB NONE DETECTED 02/01/2011 0800    Alcohol Level: No results found for this basename: ETH:2 in the last 72 hours Urinalysis:  Basename 08/12/11 0431  COLORURINE YELLOW  LABSPEC 1.024  PHURINE 6.0  GLUCOSEU NEGATIVE  HGBUR LARGE*  BILIRUBINUR NEGATIVE  KETONESUR NEGATIVE  PROTEINUR NEGATIVE  UROBILINOGEN 0.2  NITRITE NEGATIVE  LEUKOCYTESUR SMALL*   Misc. Labs:  Recent Results (from the past 240 hour(s))  URINE CULTURE     Status: Normal   Collection Time   08/12/11  4:31 AM      Component Value Range Status Comment   Specimen Description URINE, CLEAN CATCH   Final    Special Requests Normal   Final    Culture  Setup Time  [id number]   Final    Colony Count >=100,000 COLONIES/ML   Final    Culture ESCHERICHIA COLI   Final    Report Status 08/14/2011 FINAL   Final    Organism ID, Bacteria ESCHERICHIA COLI   Final   CULTURE, BLOOD (ROUTINE X 2)     Status: Normal (Preliminary result)   Collection Time   08/13/11  2:44 PM      Component Value Range Status Comment   Specimen Description BLOOD LEFT ARM   Final    Special Requests BOTTLES DRAWN AEROBIC AND ANAEROBIC 5CC EACH   Final    Culture  Setup Time [id number]   Final    Culture     Final    Value:        BLOOD CULTURE RECEIVED NO GROWTH TO DATE CULTURE WILL BE HELD FOR 5 DAYS BEFORE ISSUING A FINAL NEGATIVE REPORT   Report Status PENDING   Incomplete   CULTURE, BLOOD (ROUTINE X 2)     Status: Normal (Preliminary result)   Collection Time   08/13/11  2:45 PM      Component Value Range Status Comment   Specimen Description BLOOD LEFT HAND   Final    Special Requests BOTTLES DRAWN AEROBIC AND ANAEROBIC 3 CC EACH   Final    Culture  Setup Time [id number]   Final    Culture     Final    Value:        BLOOD CULTURE RECEIVED NO GROWTH TO DATE CULTURE WILL BE HELD FOR 5 DAYS BEFORE ISSUING A FINAL NEGATIVE REPORT   Report Status PENDING   Incomplete     Studies/Results: No results found.  Medications: Scheduled Meds:   . cefTRIAXone (ROCEPHIN)  IV  1 g Intravenous Q24H  . enoxaparin  40 mg Subcutaneous Q24H  . potassium chloride  40 mEq Oral Once  . potassium chloride  40 mEq Oral Once  . sodium chloride  1,000 mL Intravenous NOW  . sodium chloride  3 mL Intravenous Q12H  . Tamsulosin HCl  0.4 mg Oral Daily  . vancomycin  1,000 mg Intravenous Q12H   Continuous Infusions:   . sodium chloride 150 mL/hr at 08/14/11 1254  . DISCONTD: sodium chloride    . DISCONTD: sodium chloride 150 mL/hr at 08/14/11 0551   PRN Meds:.acetaminophen, acetaminophen, alum & mag hydroxide-simeth, HYDROmorphone, ondansetron (ZOFRAN) IV, ondansetron, oxyCODONE,  DISCONTD: zolpidem  Assessment/Plan:  Principal Problem:  *Pyelonephritis Active Problems:  Ureteral colic  Hypotension  Fever  Nephrolithiasis  Leukocytosis  Tobacco use disorder  SIRS (systemic inflammatory response syndrome) *Pyelonephritis: Rocephin day #3. Abdominial CT and urinalysis as above. Urine culture e coli sens to rocephin.  Active Problems:  Ureteral colic: related to #1 and #5. continue pain meds. Continue flomax . Strict I&O's.   Hypotension: somewhat improved. S/P 1000 NS bolus 08/13/11. Fluids increased to 150/hr. Monitor q4 hours.  Fever: related to #1. Blood cultures pending.  vanc per pharmacy day #2. Continue rocephin day #3.  Nephrolithiasis: CT as above. Will  Continue to strain urine. Provide flomax.  Leukocytosis: related to #1. Trending down.  Check cultures. Monitor  Tobacco use disorder: tobacco cessation education discussed.  SIRS: WC 14.4> 18. Intermittent tachycardia, persitent fever. Will continue fluid support, blood cultures  Pending. Will discontinue rocephin and start zosyn for broader coverage. Will transfer to SD for closer monitoring.   Hypokalemia: resolved. Mg level 1.7.       LOS: 2 days   [facility] M 08/14/2011, 2:07 PM

## 2011-08-14 NOTE — Progress Notes (Signed)
Vital signs in last 24 hours:  Filed Vitals:   08/14/11 0533 08/14/11 1443 08/14/11 1848 08/14/11 2000  BP: 100/68 94/59 112/59 98/48  Pulse: 113 98 111 99  Temp: 102.6 F (39.2 C) 98.8 F (37.1 C) 100.8 F (38.2 C) 100.9 F (38.3 C)  TempSrc: Oral Oral Oral Oral  Resp: 20 22 23 18   Height:   5\' 8"  (1.727 m)   Weight:   92.4 kg (203 lb 11.3 oz)   SpO2: 96% 98% 95% 92%    Lab 08/14/11 0450 08/13/11 0428 08/12/11 2030 08/12/11 0415  WBC 14.4* 18.6* 26.3* 12.1*  HGB 11.3* 11.7* 12.5 14.0  HCT 34.0* 34.3* 36.3 41.1  PLT 118* 167 185 264    Lab 08/14/11 0450 08/13/11 0428 08/12/11 2030 08/12/11 0415  NA 134* 138 139 138  K 3.9 2.9* 3.4* 3.6  CL 104 105 104 101  CO2 24 22 25 28   GLUCOSE 141* 102* 106* 115*  BUN 7 13 16 18   CREATININE 1.06 1.08 1.27* 1.06  CALCIUM 8.7 8.7 8.9 9.5   Assessment/Plan:  Principal Problem:  *Pyelonephritis Active Problems:  Ureteral colic  Hypotension  Fever  Nephrolithiasis  Leukocytosis  Tobacco use disorder  SIRS (systemic inflammatory response syndrome)  Pt was seen and examined at bedside. I have reviewed labs and vitals which indicate Tmax = 102.6 F on antibiotics with hypotension. Pt is still rather lethargic. WBC is trending down and procalcitonin level > 70 on admission and this AM in 30's so trending down. I am still concerned with hypotension and persistent fever. Will transfer pt to step down for further monitoring and will broaden the spectrum of antibiotics to Vancomycin and Zosyn. Earlier in the day pt had neck pain and there was ? Of meningitis. Pt prefers to wait and see perhaps in AM, so please re evaluate in AM to see if this is still persistent problem and decide if there is a need for LP. Risks and benefits of that procedure were explained to the pt.   LOS: 2 days   Debbora Presto 08/14/2011, 9:57 PM  [facility] Pager: [phone]

## 2011-08-14 NOTE — Progress Notes (Signed)
ANTIBIOTIC CONSULT NOTE - Follow up Consult  Pharmacy Consult for Zosyn and Vancomycin Indication: Sepsis  Allergies  Allergen Reactions  . Darvocet (Propoxacet-N) Swelling  . Ibuprofen Swelling    Patient Measurements: Height: 5\' 8"  (172.7 cm) Weight: 197 lb 6.4 oz (89.54 kg) IBW/kg (Calculated) : 63.9   Vital Signs: Temp: 102.6 F (39.2 C) (04/23 0533) Temp src: Oral (04/23 0533) BP: 100/68 mmHg (04/23 0533) Pulse Rate: 113  (04/23 0533)  Intake/Output from previous day: 04/22 0701 - 04/23 0700 In: 4510 [P.O.:960; I.V.:2350; IV Piggyback:200] Out: 5200 [Urine:5200] Labs:  [facility] 08/14/11 0450 08/13/11 0428 08/12/11 2030  WBC 14.4* 18.6* 26.3*  HGB 11.3* 11.7* 12.5  PLT 118* 167 185  LABCREA -- -- --  CREATININE 1.06 1.08 1.27*   Estimated Creatinine Clearance: 72.6 ml/min (by C-G formula based on Cr of 1.06).  Microbiology: Recent Results (from the past 720 hour(s))  URINE CULTURE     Status: Normal   Collection Time   08/12/11  4:31 AM      Component Value Range Status Comment   Specimen Description URINE, CLEAN CATCH   Final    Special Requests Normal   Final    Culture  Setup Time [id number]   Final    Colony Count >=100,000 COLONIES/ML   Final    Culture ESCHERICHIA COLI   Final    Report Status 08/14/2011 FINAL   Final    Organism ID, Bacteria ESCHERICHIA COLI   Final   CULTURE, BLOOD (ROUTINE X 2)     Status: Normal (Preliminary result)   Collection Time   08/13/11  2:44 PM      Component Value Range Status Comment   Specimen Description BLOOD LEFT ARM   Final    Special Requests BOTTLES DRAWN AEROBIC AND ANAEROBIC 5CC EACH   Final    Culture  Setup Time [id number]   Final    Culture     Final    Value:        BLOOD CULTURE RECEIVED NO GROWTH TO DATE CULTURE WILL BE HELD FOR 5 DAYS BEFORE ISSUING A FINAL NEGATIVE REPORT   Report Status PENDING   Incomplete   CULTURE, BLOOD (ROUTINE X 2)     Status: Normal (Preliminary result)   Collection Time    08/13/11  2:45 PM      Component Value Range Status Comment   Specimen Description BLOOD LEFT HAND   Final    Special Requests BOTTLES DRAWN AEROBIC AND ANAEROBIC 3 CC EACH   Final    Culture  Setup Time [id number]   Final    Culture     Final    Value:        BLOOD CULTURE RECEIVED NO GROWTH TO DATE CULTURE WILL BE HELD FOR 5 DAYS BEFORE ISSUING A FINAL NEGATIVE REPORT   Report Status PENDING   Incomplete     Medications:  Anti-infectives     Start     Dose/Rate Route Frequency Ordered Stop   08/13/11 2300   vancomycin (VANCOCIN) IVPB 1000 mg/200 mL premix        1,000 mg 200 mL/hr over 60 Minutes Intravenous Every 12 hours 08/13/11 2223     08/13/11 2000   cefTRIAXone (ROCEPHIN) 1 g in dextrose 5 % 50 mL IVPB  Status:  Discontinued        1 g 100 mL/hr over 30 Minutes Intravenous Every 24 hours 08/12/11 2103 08/12/11 2129   08/12/11 2129   cefTRIAXone (  ROCEPHIN) 1 g in dextrose 5 % 50 mL IVPB  Status:  Discontinued        1 g 100 mL/hr over 30 Minutes Intravenous Every 24 hours 08/12/11 2129 08/14/11 1423         Assessment:  51 yoF admit with nephrolithiasis (CT shows left ureteral stone), pyelonephritis, SIRS.  Urine culture (4/21) is > 100,000 Ecoli (sens: Cefazolin, Ceftriaxone, Cipro, Gent, Levaquin, NTF, Zosyn, Tob, Bactrim.  Resistant: ampicillin)  Day #3 ceftriaxone, but was escalated to zosyn and vanc due to ongoing fevers and declining clinical status.  Blood cultures pending  Renal function improving, CrCl ~ 73 ml/min  Goal of Therapy:  Vancomycin trough level 15-20 mcg/ml  Plan:   Zosyn 3.375g IV q8 hours, extended infusion  Continue Vancomycin 1g IV Q12 hours  Measure antibiotic drug levels at steady state  Follow up culture results, renal function, and labs as available.   Lynann Beaver PharmD, BCPS Pager [phone] 08/14/2011 2:36 PM

## 2011-08-15 ENCOUNTER — Inpatient Hospital Stay (HOSPITAL_COMMUNITY): Payer: Self-pay

## 2011-08-15 DIAGNOSIS — G43909 Migraine, unspecified, not intractable, without status migrainosus: Secondary | ICD-10-CM

## 2011-08-15 LAB — COMPREHENSIVE METABOLIC PANEL
ALT: 29 U/L (ref 0–35)
AST: 23 U/L (ref 0–37)
Alkaline Phosphatase: 85 U/L (ref 39–117)
CO2: 23 mEq/L (ref 19–32)
Chloride: 101 mEq/L (ref 96–112)
GFR calc non Af Amer: 68 mL/min — ABNORMAL LOW (ref >90)
Sodium: 132 mEq/L — ABNORMAL LOW (ref 135–145)
Total Bilirubin: 0.5 mg/dL (ref 0.3–1.2)

## 2011-08-15 LAB — CBC
Platelets: 127 10*3/uL — ABNORMAL LOW (ref 150–400)
RBC: 3.32 MIL/uL — ABNORMAL LOW (ref 3.87–5.11)
WBC: 10.1 10*3/uL (ref 4.0–10.5)

## 2011-08-15 LAB — VANCOMYCIN, TROUGH: Vancomycin Tr: 5.6 ug/mL — ABNORMAL LOW (ref 10.0–20.0)

## 2011-08-15 MED ORDER — SODIUM CHLORIDE 0.45 % IV BOLUS
1000.0000 mL | Freq: Once | INTRAVENOUS | Status: AC
  Administered 2011-08-15: 1000 mL via INTRAVENOUS

## 2011-08-15 MED ORDER — CYCLOBENZAPRINE HCL 10 MG PO TABS
10.0000 mg | ORAL_TABLET | Freq: Once | ORAL | Status: AC
  Administered 2011-08-15: 10 mg via ORAL
  Filled 2011-08-15: qty 1

## 2011-08-15 MED ORDER — SUMATRIPTAN SUCCINATE 25 MG PO TABS
25.0000 mg | ORAL_TABLET | ORAL | Status: DC | PRN
  Administered 2011-08-15 (×2): 25 mg via ORAL
  Filled 2011-08-15 (×2): qty 1

## 2011-08-15 MED ORDER — SODIUM CHLORIDE 0.45 % IV BOLUS
500.0000 mL | Freq: Once | INTRAVENOUS | Status: AC
  Administered 2011-08-15: 500 mL via INTRAVENOUS

## 2011-08-15 MED ORDER — VANCOMYCIN HCL IN DEXTROSE 1-5 GM/200ML-% IV SOLN
1000.0000 mg | Freq: Three times a day (TID) | INTRAVENOUS | Status: DC
  Administered 2011-08-15 – 2011-08-18 (×8): 1000 mg via INTRAVENOUS
  Filled 2011-08-15 (×11): qty 200

## 2011-08-15 MED ORDER — SODIUM CHLORIDE 0.9 % IV BOLUS (SEPSIS)
500.0000 mL | Freq: Once | INTRAVENOUS | Status: AC
Start: 2011-08-15 — End: 2011-08-15
  Administered 2011-08-15: 500 mL via INTRAVENOUS

## 2011-08-15 MED ORDER — HYDROCORTISONE SOD SUCCINATE 100 MG IJ SOLR
50.0000 mg | Freq: Three times a day (TID) | INTRAMUSCULAR | Status: DC
  Administered 2011-08-15 – 2011-08-18 (×9): 50 mg via INTRAVENOUS
  Filled 2011-08-15 (×3): qty 1
  Filled 2011-08-15: qty 2
  Filled 2011-08-15 (×9): qty 1

## 2011-08-15 MED ORDER — SODIUM CHLORIDE 0.9 % IV SOLN
INTRAVENOUS | Status: DC
  Administered 2011-08-15 – 2011-08-17 (×4): via INTRAVENOUS
  Administered 2011-08-18 (×2): 1000 mL via INTRAVENOUS
  Administered 2011-08-18: 04:00:00 via INTRAVENOUS
  Administered 2011-08-19: 1000 mL via INTRAVENOUS

## 2011-08-15 NOTE — ED Notes (Signed)
ICU called about patient missing pocketbook and cell phone. No documentation in chart that states staff removed belongings or documentation on what items patient had. ICU notified of findings. ICU states [facility] stated there was documentation of missing belonging but I did not find it. ICU notified that we still will keep an eye out for it.

## 2011-08-15 NOTE — Progress Notes (Signed)
Called by RN Fabian November with continued low BP with SBP in the 70s. Flomax was given this am prior to it being discontinued. Will order a fluid bolus, cortisol level and will place on stress-dose steroids pending cortisol levels.  Peggye Pitt, MD [facility] Pager: [phone]

## 2011-08-15 NOTE — Progress Notes (Signed)
Pt w/ persistent low BP's through-out night. Some improvement w/ bolus. Usually able to keep MAP 60 or >. Persistent (R) lateral neck pain that pt states radiates to her (R) upper back and is associated w/ intermittent sharp stabbing pains to her (R) eye.  At bedside pt crying at intervals w/ this severe pain. There is an area that is very TTP behind her (R) ear which is where her pain seems to originate from. Denies actual h/a. States she has had this pain since soon after her admission to hosp. Medicated w/ small dose of dilaudid which has dropped her pressure again to 80/43.

## 2011-08-15 NOTE — Progress Notes (Signed)
ANTIBIOTIC CONSULT NOTE - Follow up Consult  Pharmacy Consult for Vancomycin Indication: Sepsis  Allergies  Allergen Reactions  . Darvocet (Propoxacet-N) Swelling  . Ibuprofen Swelling    Patient Measurements: Height: 5\' 8"  (172.7 cm) Weight: 202 lb 13.2 oz (92 kg) IBW/kg (Calculated) : 63.9   Vital Signs: Temp: 97.4 F (36.3 C) (04/24 0800) Temp src: Oral (04/24 0800) BP: 99/53 mmHg (04/24 1100) Pulse Rate: 84  (04/24 1100)  Intake/Output from previous day: 04/23 0701 - 04/24 0700 In: 920 [P.O.:720; I.V.:150; IV Piggyback:50] Out: 5000 [Urine:5000] Labs:  [facility] 08/15/11 0345 08/14/11 0450 08/13/11 0428  WBC 10.1 14.4* 18.6*  HGB 10.6* 11.3* 11.7*  PLT 127* 118* 167  LABCREA -- -- --  CREATININE 0.95 1.06 1.08   Estimated Creatinine Clearance: 82.1 ml/min (by C-G formula based on Cr of 0.95).  Microbiology: Recent Results (from the past 720 hour(s))  URINE CULTURE     Status: Normal   Collection Time   08/12/11  4:31 AM      Component Value Range Status Comment   Specimen Description URINE, CLEAN CATCH   Final    Special Requests Normal   Final    Culture  Setup Time [id number]   Final    Colony Count >=100,000 COLONIES/ML   Final    Culture ESCHERICHIA COLI   Final    Report Status 08/14/2011 FINAL   Final    Organism ID, Bacteria ESCHERICHIA COLI   Final   CULTURE, BLOOD (ROUTINE X 2)     Status: Normal (Preliminary result)   Collection Time   08/13/11  2:44 PM      Component Value Range Status Comment   Specimen Description BLOOD LEFT ARM   Final    Special Requests BOTTLES DRAWN AEROBIC AND ANAEROBIC 5CC EACH   Final    Culture  Setup Time [id number]   Final    Culture     Final    Value:        BLOOD CULTURE RECEIVED NO GROWTH TO DATE CULTURE WILL BE HELD FOR 5 DAYS BEFORE ISSUING A FINAL NEGATIVE REPORT   Report Status PENDING   Incomplete   CULTURE, BLOOD (ROUTINE X 2)     Status: Normal (Preliminary result)   Collection Time   08/13/11  2:45  PM      Component Value Range Status Comment   Specimen Description BLOOD LEFT HAND   Final    Special Requests BOTTLES DRAWN AEROBIC AND ANAEROBIC 3 CC EACH   Final    Culture  Setup Time [id number]   Final    Culture     Final    Value:        BLOOD CULTURE RECEIVED NO GROWTH TO DATE CULTURE WILL BE HELD FOR 5 DAYS BEFORE ISSUING A FINAL NEGATIVE REPORT   Report Status PENDING   Incomplete   MRSA PCR SCREENING     Status: Normal   Collection Time   08/14/11  6:53 PM      Component Value Range Status Comment   MRSA by PCR NEGATIVE  NEGATIVE  Final     Medications:  Anti-infectives     Start     Dose/Rate Route Frequency Ordered Stop   08/15/11 1800   vancomycin (VANCOCIN) IVPB 1000 mg/200 mL premix        1,000 mg 200 mL/hr over 60 Minutes Intravenous Every 8 hours 08/15/11 1137     08/14/11 2230   piperacillin-tazobactam (ZOSYN) IVPB 3.375 g  3.375 g 12.5 mL/hr over 240 Minutes Intravenous 3 times per day 08/14/11 2213     08/13/11 2300   vancomycin (VANCOCIN) IVPB 1000 mg/200 mL premix  Status:  Discontinued        1,000 mg 200 mL/hr over 60 Minutes Intravenous Every 12 hours 08/13/11 2223 08/15/11 1137   08/13/11 2000   cefTRIAXone (ROCEPHIN) 1 g in dextrose 5 % 50 mL IVPB  Status:  Discontinued        1 g 100 mL/hr over 30 Minutes Intravenous Every 24 hours 08/12/11 2103 08/12/11 2129   08/12/11 2129   cefTRIAXone (ROCEPHIN) 1 g in dextrose 5 % 50 mL IVPB  Status:  Discontinued        1 g 100 mL/hr over 30 Minutes Intravenous Every 24 hours 08/12/11 2129 08/14/11 1423         Assessment:  51 yoF admit with nephrolithiasis (CT shows left ureteral stone), pyelonephritis, SIRS.  Urine culture (4/21) is > 100,000 Ecoli (sens: Cefazolin, Ceftriaxone, Cipro, Gent, Levaquin, NTF, Zosyn, Tob, Bactrim.  Resistant: ampicillin)  Day #2 vancomycin and zosyn for sepsis/UTI.  Vancomycin trough returned low on 1g q12h dosing, will adjust to q8h dosing today and f/u  repeat trough.  Blood cultures pending  Renal function improving, CrCl ~ 82 ml/min  Goal of Therapy:  Vancomycin trough level 15-20 mcg/ml  Plan:   Adjust Vancomycin to 1g IV Q8 hours  Measure antibiotic drug levels at steady state  Follow up culture results, renal function, and labs as available.  Clance Boll, PharmD, BCPS Pager: [phone] 08/15/2011 11:48 AM

## 2011-08-15 NOTE — Progress Notes (Addendum)
Subjective: Still with posterior headache that migrates to right eye, right ear, says this is typical for her migraine headaches. Has continued to be hypotensive overnight. Not lethargic, can answer questions appropriately.  Objective: Vital signs in last 24 hours: Temp:  [97.3 F (36.3 C)-102.9 F (39.4 C)] 97.4 F (36.3 C) (04/24 0800) Pulse Rate:  [82-111] 84  (04/24 0800) Resp:  [14-25] 14  (04/24 0800) BP: (75-112)/(37-66) 89/54 mmHg (04/24 0800) SpO2:  [90 %-99 %] 93 % (04/24 0800) Weight:  [92 kg (202 lb 13.2 oz)-92.4 kg (203 lb 11.3 oz)] 92 kg (202 lb 13.2 oz) (04/24 0000) Weight change:  Last BM Date: 08/10/11  Intake/Output from previous day: 04/23 0701 - 04/24 0700 In: 770 [P.O.:720; IV Piggyback:50] Out: 5000 [Urine:5000] Total I/O In: 1000 [IV Piggyback:1000] Out: 700 [Urine:700]   Physical Exam: General: Alert, awake, oriented x3. HEENT: No bruits, no goiter. Heart: Regular rate and rhythm, without murmurs, rubs, gallops. Lungs: Clear to auscultation bilaterally. Abdomen: Soft, nontender, nondistended, positive bowel sounds. Extremities: No clubbing cyanosis or edema with positive pedal pulses. Neuro: Grossly intact, nonfocal.    Lab Results: Basic Metabolic Panel:  Basename 08/15/11 0345 08/14/11 0450 08/13/11 0428  NA 132* 134* --  K 3.5 3.9 --  CL 101 104 --  CO2 23 24 --  GLUCOSE 101* 141* --  BUN 6 7 --  CREATININE 0.95 1.06 --  CALCIUM 8.4 8.7 --  MG -- -- 1.7  PHOS -- -- --   Liver Function Tests:  [facility] 08/15/11 0345  AST 23  ALT 29  ALKPHOS 85  BILITOT 0.5  PROT 6.0  ALBUMIN 2.5*   CBC:  Basename 08/15/11 0345 08/14/11 0450 08/12/11 2030  WBC 10.1 14.4* --  NEUTROABS -- -- 24.2*  HGB 10.6* 11.3* --  HCT 31.0* 34.0* --  MCV 93.4 96.0 --  PLT 127* 118* --   Urine Drug Screen: Drugs of Abuse     Component Value Date/Time   LABOPIA NONE DETECTED 02/01/2011 0800   LABOPIA NEG 05/03/2010 0130   COCAINSCRNUR POSITIVE*  02/01/2011 0800   COCAINSCRNUR POS* 05/03/2010 0130   LABBENZ NONE DETECTED 02/01/2011 0800   LABBENZ NEG 05/03/2010 0130   AMPHETMU NONE DETECTED 02/01/2011 0800   AMPHETMU NEG 05/03/2010 0130   THCU NONE DETECTED 02/01/2011 0800   LABBARB NONE DETECTED 02/01/2011 0800     Recent Results (from the past 240 hour(s))  URINE CULTURE     Status: Normal   Collection Time   08/12/11  4:31 AM      Component Value Range Status Comment   Specimen Description URINE, CLEAN CATCH   Final    Special Requests Normal   Final    Culture  Setup Time [id number]   Final    Colony Count >=100,000 COLONIES/ML   Final    Culture ESCHERICHIA COLI   Final    Report Status 08/14/2011 FINAL   Final    Organism ID, Bacteria ESCHERICHIA COLI   Final   CULTURE, BLOOD (ROUTINE X 2)     Status: Normal (Preliminary result)   Collection Time   08/13/11  2:44 PM      Component Value Range Status Comment   Specimen Description BLOOD LEFT ARM   Final    Special Requests BOTTLES DRAWN AEROBIC AND ANAEROBIC [facility]   Final    Culture  Setup Time [id number]   Final    Culture     Final    Value:  BLOOD CULTURE RECEIVED NO GROWTH TO DATE CULTURE WILL BE HELD FOR 5 DAYS BEFORE ISSUING A FINAL NEGATIVE REPORT   Report Status PENDING   Incomplete   CULTURE, BLOOD (ROUTINE X 2)     Status: Normal (Preliminary result)   Collection Time   08/13/11  2:45 PM      Component Value Range Status Comment   Specimen Description BLOOD LEFT HAND   Final    Special Requests BOTTLES DRAWN AEROBIC AND ANAEROBIC 3 CC EACH   Final    Culture  Setup Time [id number]   Final    Culture     Final    Value:        BLOOD CULTURE RECEIVED NO GROWTH TO DATE CULTURE WILL BE HELD FOR 5 DAYS BEFORE ISSUING A FINAL NEGATIVE REPORT   Report Status PENDING   Incomplete   MRSA PCR SCREENING     Status: Normal   Collection Time   08/14/11  6:53 PM      Component Value Range Status Comment   MRSA by PCR NEGATIVE  NEGATIVE  Final      Studies/Results: No results found.  Medications: Scheduled Meds:   . cyclobenzaprine  10 mg Oral Once  . enoxaparin  40 mg Subcutaneous Q24H  . piperacillin-tazobactam (ZOSYN)  IV  3.375 g Intravenous Q8H  . sodium chloride  1,000 mL Intravenous Once  . sodium chloride  500 mL Intravenous Once  . sodium chloride  3 mL Intravenous Q12H  . vancomycin  1,000 mg Intravenous Q12H  . DISCONTD: cefTRIAXone (ROCEPHIN)  IV  1 g Intravenous Q24H  . DISCONTD: Tamsulosin HCl  0.4 mg Oral Daily   Continuous Infusions:   . sodium chloride    . DISCONTD: sodium chloride Stopped (08/15/11 0800)   PRN Meds:.acetaminophen, acetaminophen, alum & mag hydroxide-simeth, HYDROmorphone, ondansetron (ZOFRAN) IV, ondansetron, oxyCODONE, SUMAtriptan  Assessment/Plan:  Principal Problem:  *Pyelonephritis Active Problems:  Ureteral colic  Hypotension  Fever  Nephrolithiasis  Leukocytosis  Tobacco use disorder  SIRS (systemic inflammatory response syndrome)  Migraine   #1 SIRS/Sepsis: Presumed 2/2 E Coli Pyelonephritis. Antibiotics were broadened to vanc/zosyn given worsening clinical condition and persistent fevers, despite e coli being sensitive to rocephin. T Max 102.9. Continue vanc/zosyn for now. Recheck blood/urine cultures. Check CXR to r/o PNA as a source of fevers. No diarrhea. Leukocytosis improving on antibiotics. DC flomax as likely contributing to low BPs. Do not think we need to r/o meningitis with an LP as she states her HA is typical for her migraines.  #2 Migraine HA: start imitrex. Avoid using narcotics which can lower BP further.  #3 Dispo: Keep in SDU today. Consider moving in am if BP stabilizes.   LOS: 3 days   HERNANDEZ ACOSTA,Shawnae Leiva [facility] Pager: [phone] 08/15/2011, 9:29 AM

## 2011-08-16 ENCOUNTER — Encounter (HOSPITAL_COMMUNITY): Payer: Self-pay | Admitting: Anesthesiology

## 2011-08-16 ENCOUNTER — Encounter (HOSPITAL_COMMUNITY)
Admission: EM | Disposition: A | Discharge status: Home / Self Care | Payer: Self-pay | Source: Home / Self Care | Attending: Internal Medicine

## 2011-08-16 ENCOUNTER — Inpatient Hospital Stay (HOSPITAL_COMMUNITY): Payer: Self-pay | Admitting: Anesthesiology

## 2011-08-16 DIAGNOSIS — J189 Pneumonia, unspecified organism: Secondary | ICD-10-CM

## 2011-08-16 LAB — CBC
MCH: 32.3 pg (ref 26.0–34.0)
MCHC: 34.8 g/dL (ref 30.0–36.0)
Platelets: 155 10*3/uL (ref 150–400)
RBC: 3.34 MIL/uL — ABNORMAL LOW (ref 3.87–5.11)
RDW: 13.2 % (ref 11.5–15.5)

## 2011-08-16 LAB — BASIC METABOLIC PANEL
Calcium: 8.7 mg/dL (ref 8.4–10.5)
GFR calc non Af Amer: 69 mL/min — ABNORMAL LOW (ref >90)
Glucose, Bld: 208 mg/dL — ABNORMAL HIGH (ref 70–99)
Sodium: 137 mEq/L (ref 135–145)

## 2011-08-16 LAB — URINE CULTURE
Colony Count: NO GROWTH
Culture  Setup Time: 201304250059

## 2011-08-16 LAB — VANCOMYCIN, TROUGH: Vancomycin Tr: 18.1 ug/mL (ref 10.0–20.0)

## 2011-08-16 SURGERY — CYSTOURETEROSCOPY, WITH RETROGRADE PYELOGRAM AND STENT INSERTION
Anesthesia: General | Laterality: Left | Wound class: Clean Contaminated

## 2011-08-16 MED ORDER — IOHEXOL 300 MG/ML  SOLN
INTRAMUSCULAR | Status: DC | PRN
  Administered 2011-08-16: 10 mL

## 2011-08-16 MED ORDER — LEVOFLOXACIN IN D5W 750 MG/150ML IV SOLN
750.0000 mg | INTRAVENOUS | Status: DC
  Administered 2011-08-16 – 2011-08-18 (×3): 750 mg via INTRAVENOUS
  Filled 2011-08-16 (×3): qty 150

## 2011-08-16 MED ORDER — IOHEXOL 300 MG/ML  SOLN
INTRAMUSCULAR | Status: AC
  Filled 2011-08-16: qty 1

## 2011-08-16 MED ORDER — PROPOFOL 10 MG/ML IV EMUL
INTRAVENOUS | Status: DC | PRN
  Administered 2011-08-16: 150 mg via INTRAVENOUS

## 2011-08-16 MED ORDER — LACTATED RINGERS IV SOLN
INTRAVENOUS | Status: DC | PRN
  Administered 2011-08-16: 13:00:00 via INTRAVENOUS

## 2011-08-16 MED ORDER — HYDROMORPHONE HCL PF 1 MG/ML IJ SOLN: 0.2500 mg | INTRAMUSCULAR | Status: DC | PRN

## 2011-08-16 MED ORDER — MIDAZOLAM HCL 5 MG/5ML IJ SOLN
INTRAMUSCULAR | Status: DC | PRN
  Administered 2011-08-16: 2 mg via INTRAVENOUS

## 2011-08-16 MED ORDER — VANCOMYCIN HCL IN DEXTROSE 1-5 GM/200ML-% IV SOLN
INTRAVENOUS | Status: AC
  Filled 2011-08-16: qty 200

## 2011-08-16 MED ORDER — POTASSIUM CHLORIDE CRYS ER 20 MEQ PO TBCR
20.0000 meq | EXTENDED_RELEASE_TABLET | Freq: Once | ORAL | Status: AC
  Administered 2011-08-16: 20 meq via ORAL
  Filled 2011-08-16: qty 1

## 2011-08-16 MED ORDER — SODIUM CHLORIDE 0.9 % IR SOLN
Status: DC | PRN
  Administered 2011-08-16: 3000 mL

## 2011-08-16 MED ORDER — SUCCINYLCHOLINE CHLORIDE 20 MG/ML IJ SOLN
INTRAMUSCULAR | Status: DC | PRN
  Administered 2011-08-16: 100 mg via INTRAVENOUS

## 2011-08-16 MED ORDER — LIDOCAINE HCL 2 % EX GEL
CUTANEOUS | Status: AC
  Filled 2011-08-16: qty 10

## 2011-08-16 MED ORDER — FENTANYL CITRATE 0.05 MG/ML IJ SOLN
INTRAMUSCULAR | Status: DC | PRN
  Administered 2011-08-16: 50 ug via INTRAVENOUS

## 2011-08-16 SURGICAL SUPPLY — 39 items
ADAPTER CATH URET PLST 4-6FR (CATHETERS) IMPLANT
ADPR CATH URET STRL DISP 4-6FR (CATHETERS)
BAG URINE DRAINAGE (UROLOGICAL SUPPLIES) ×2 IMPLANT
BASKET LASER NITINOL 1.9FR (BASKET) IMPLANT
BASKET STNLS GEMINI 4WIRE 3FR (BASKET) IMPLANT
BASKET ZERO TIP NITINOL 2.4FR (BASKET) IMPLANT
BRUSH URET BIOPSY 3F (UROLOGICAL SUPPLIES) IMPLANT
BSKT STON RTRVL 120 1.9FR (BASKET)
BSKT STON RTRVL GEM 120X11 3FR (BASKET)
BSKT STON RTRVL ZERO TP 2.4FR (BASKET)
CANISTER SUCT LVC 12 LTR MEDI- (MISCELLANEOUS) IMPLANT
CATH CLEAR GEL 3F BACKSTOP (CATHETERS) IMPLANT
CATH INTERMIT  6FR 70CM (CATHETERS) IMPLANT
CATH URET 5FR 28IN CONE TIP (BALLOONS)
CATH URET 5FR 28IN OPEN ENDED (CATHETERS) IMPLANT
CATH URET 5FR 70CM CONE TIP (BALLOONS) IMPLANT
CATH URET DUAL LUMEN 6-10FR 50 (CATHETERS) IMPLANT
CLOTH BEACON ORANGE TIMEOUT ST (SAFETY) ×2 IMPLANT
DRAPE CAMERA CLOSED 9X96 (DRAPES) ×2 IMPLANT
ELECT REM PT RETURN 9FT ADLT (ELECTROSURGICAL)
ELECTRODE REM PT RTRN 9FT ADLT (ELECTROSURGICAL) IMPLANT
GLOVE BIOGEL PI IND STRL 7.5 (GLOVE) ×1 IMPLANT
GLOVE BIOGEL PI INDICATOR 7.5 (GLOVE)
GLOVE ECLIPSE 7.0 STRL STRAW (GLOVE) ×4 IMPLANT
GOWN PREVENTION PLUS LG XLONG (DISPOSABLE) ×2 IMPLANT
GUIDEWIRE .038 (WIRE) IMPLANT
GUIDEWIRE ANG ZIPWIRE 038X150 (WIRE) IMPLANT
GUIDEWIRE STR DUAL SENSOR (WIRE) ×2 IMPLANT
IV NS IRRIG 3000ML ARTHROMATIC (IV SOLUTION) ×1 IMPLANT
KIT BALLIN UROMAX 15FX10 (LABEL) IMPLANT
KIT BALLN UROMAX 15FX4 (MISCELLANEOUS) IMPLANT
KIT BALLN UROMAX 26 75X4 (MISCELLANEOUS)
LASER FIBER DISP (UROLOGICAL SUPPLIES) IMPLANT
PACK CYSTO (CUSTOM PROCEDURE TRAY) ×2 IMPLANT
SET HIGH PRES BAL DIL (LABEL)
SHEATH URET ACCESS 12FR/35CM (UROLOGICAL SUPPLIES) IMPLANT
SHEATH URET ACCESS 12FR/55CM (UROLOGICAL SUPPLIES) IMPLANT
STENT CONTOUR URETERAL (STENTS) ×1 IMPLANT
SYRINGE IRR TOOMEY STRL 70CC (SYRINGE) IMPLANT

## 2011-08-16 NOTE — Op Note (Signed)
Preoperative diagnosis:  1. Left ureteral calculus with urosepsis  Postoperative diagnosis:  1. Same   Procedure:  1. Cystoscopy 2. Left ureteral stent placement (6 [place]) 24 CM 3. Left retrograde pyelography with interpretation No definitive filling defect noted.  Surgeon: Valetta Fuller, MD  Anesthesia: General  Complications: None  Intraoperative findings: The patient was brought to surgery after being admitted for days ago with a febrile illness in the diagnosed ureteral calculus. Urology services were just contacted today about the patient's clinical situation. There was no definitive evidence of stone passage and given her fever which had been as high as 102.6 we felt that definitive procedure should be performed to unobstruct the kidney. At that time a retrograde topography I did not see definitive evidence of a filling defect consistent with stone nor was there evidence of any high-grade obstruction. It is quite possible that this stone had indeed passed but we felt given her situation a double-J stent should be placed to assure adequate drainage of that kidney.  EBL: Minimal  Specimens: None  Indication: Kelli Jenkins is a 51 y.o. patient with A febrile urinary tract infection and a distal left ureteral calculus.. After reviewing the management options for treatment, he elected to proceed with the above surgical procedure(s). We have discussed the potential benefits and risks of the procedure, side effects of the proposed treatment, the likelihood of the patient achieving the goals of the procedure, and any potential problems that might occur during the procedure or recuperation. Informed consent has been obtained.  Description of procedure:  The patient was taken to the operating room and general anesthesia was induced.  The patient was placed in the dorsal lithotomy position, prepped and draped in the usual sterile fashion, and preoperative antibiotics were administered. A  preoperative time-out was performed.   Cystourethroscopy was performed. The bladder was then systematically examined in its entirety. There was no evidence for any bladder tumors, stones, or other mucosal pathology.    Attention then turned to the Left ureteral orifice and a ureteral catheter was used to intubate the ureteral orifice.  Omnipaque contrast was injected through the ureteral catheter and a retrograde pyelogram was performed with findings as dictated above.  A 0.38 sensor guidewire was then advanced up the Left ureter into the renal pelvis under fluoroscopic guidance.  The wire was then backloaded through the cystoscope and a ureteral stent was advance over the wire using Seldinger technique.  The stent was positioned appropriately under fluoroscopic and cystoscopic guidance.  The wire was then removed with an adequate stent curl noted in the renal pelvis as well as in the bladder.  The bladder was then emptied and the procedure ended.  The patient appeared to tolerate the procedure well and without complications.  The patient was able to be awakened and transferred to the recovery unit in satisfactory condition.    Valetta Fuller, MD

## 2011-08-16 NOTE — Progress Notes (Deleted)
CARE MANAGEMENT NOTE 08/16/2011  Patient:  Kelli Jenkins   Account Number:  [id number]  Date Initiated:  08/16/2011  Documentation initiated by:  Jahvier Aldea  Subjective/Objective Assessment:   admitted with intentional overdose     Action/Plan:   lives alone   Anticipated DC Date:  08/19/2011   Anticipated DC Plan:  HOME/SELF CARE  In-house referral  Clinical Social Leisure centre manager         Choice offered to / List presented to:             Status of service:  In process, will continue to follow Medicare Important Message given?   (If response is "NO", the following Medicare IM given date fields will be blank) Date Medicare IM given:   Date Additional Medicare IM given:    Discharge Disposition:    Per UR Regulation:  Reviewed for med. necessity/level of care/duration of stay  If discussed at Long Length of Stay Meetings, dates discussed:    Comments:  [id number]/Kristianne Albin Lorrin Mais Case Management [phone]

## 2011-08-16 NOTE — Anesthesia Postprocedure Evaluation (Signed)
  Anesthesia Post-op Note  Patient: Kelli Jenkins  Procedure(s) Performed: Procedure(s) (LRB): CYSTOSCOPY WITH RETROGRADE PYELOGRAM, URETEROSCOPY AND STENT PLACEMENT (Left)  Patient Location: PACU  Anesthesia Type: General  Level of Consciousness: oriented and sedated  Airway and Oxygen Therapy: Patient Spontanous Breathing and Patient connected to nasal cannula oxygen  Post-op Pain: mild  Post-op Assessment: Post-op Vital signs reviewed, Patient's Cardiovascular Status Stable, Respiratory Function Stable and Patent Airway  Post-op Vital Signs: stable  Complications: No apparent anesthesia complications

## 2011-08-16 NOTE — Anesthesia Preprocedure Evaluation (Signed)
Anesthesia Evaluation  Patient identified by MRN, date of birth, ID band Patient awake  General Assessment Comment:Ate 07:00, light breakfast  Reviewed: Allergy & Precautions, H&P , NPO status , Patient's Chart, lab work & pertinent test results, reviewed documented beta blocker date and time   Airway Mallampati: II TM Distance: >3 FB Neck ROM: Full    Dental  (+) Teeth Intact and Dental Advisory Given   Pulmonary Current Smoker,  breath sounds clear to auscultation        Cardiovascular negative cardio ROS  Rhythm:Regular Rate:Normal  Denies cardiac symptoms   Neuro/Psych negative neurological ROS  negative psych ROS   GI/Hepatic negative GI ROS, Neg liver ROS,   Endo/Other  negative endocrine ROS  Renal/GU Kidney stone  negative genitourinary   Musculoskeletal negative musculoskeletal ROS (+)   Abdominal   Peds negative pediatric ROS (+)  Hematology Anemia, Hgb 10.8   Anesthesia Other Findings   Reproductive/Obstetrics negative OB ROS                           Anesthesia Physical Anesthesia Plan  ASA: II and Emergent  Anesthesia Plan: General   Post-op Pain Management:    Induction: Intravenous, Rapid sequence and Cricoid pressure planned  Airway Management Planned: Oral ETT  Additional Equipment:   Intra-op Plan:   Post-operative Plan: Extubation in OR  Informed Consent: I have reviewed the patients History and Physical, chart, labs and discussed the procedure including the risks, benefits and alternatives for the proposed anesthesia with the patient or authorized representative who has indicated his/her understanding and acceptance.   Dental advisory given  Plan Discussed with: CRNA and Surgeon  Anesthesia Plan Comments:         Anesthesia Quick Evaluation

## 2011-08-16 NOTE — Progress Notes (Signed)
Subjective: Today has cough with productive sputum. HA has improved. Still complaining of left-sided flank pain.  Objective: Vital signs in last 24 hours: Temp:  [97.9 F (36.6 C)-102.6 F (39.2 C)] 98.1 F (36.7 C) (04/25 0800) Pulse Rate:  [76-113] 80  (04/25 0800) Resp:  [9-28] 17  (04/25 0800) BP: (73-130)/(28-77) 130/67 mmHg (04/25 0800) SpO2:  [87 %-98 %] 94 % (04/25 0800) Weight change:  Last BM Date: 08/09/11 (per pt report)  Intake/Output from previous day: 04/24 0701 - 04/25 0700 In: 6905 [P.O.:2280; I.V.:3150; IV Piggyback:1475] Out: 5200 [Urine:5200] Total I/O In: 565 [P.O.:240; I.V.:300; IV Piggyback:25] Out: -    Physical Exam: General: Alert, awake, oriented x3, in no acute distress. HEENT: No bruits, no goiter. Heart: Regular rate and rhythm, without murmurs, rubs, gallops. Lungs: Clear to auscultation bilaterally. Abdomen: Soft, nontender, nondistended, positive bowel sounds. Extremities: No clubbing cyanosis or edema with positive pedal pulses. Neuro: Grossly intact, nonfocal.    Lab Results: Basic Metabolic Panel:  Basename 08/16/11 0330 08/15/11 0345  NA 137 132*  K 3.3* 3.5  CL 103 101  CO2 24 23  GLUCOSE 208* 101*  BUN 11 6  CREATININE 0.93 0.95  CALCIUM 8.7 8.4  MG -- --  PHOS -- --   Liver Function Tests:  Basename 08/15/11 0345  AST 23  ALT 29  ALKPHOS 85  BILITOT 0.5  PROT 6.0  ALBUMIN 2.5*   CBC:  Basename 08/16/11 0330 08/15/11 0345  WBC 8.3 10.1  NEUTROABS -- --  HGB 10.8* 10.6*  HCT 31.0* 31.0*  MCV 92.8 93.4  PLT 155 127*   Urine Drug Screen: Drugs of Abuse     Component Value Date/Time   LABOPIA NONE DETECTED 02/01/2011 0800   LABOPIA NEG 05/03/2010 0130   COCAINSCRNUR POSITIVE* 02/01/2011 0800   COCAINSCRNUR POS* 05/03/2010 0130   LABBENZ NONE DETECTED 02/01/2011 0800   LABBENZ NEG 05/03/2010 0130   AMPHETMU NONE DETECTED 02/01/2011 0800   AMPHETMU NEG 05/03/2010 0130   THCU NONE DETECTED 02/01/2011 0800     LABBARB NONE DETECTED 02/01/2011 0800     Recent Results (from the past 240 hour(s))  URINE CULTURE     Status: Normal   Collection Time   08/12/11  4:31 AM      Component Value Range Status Comment   Specimen Description URINE, CLEAN CATCH   Final    Special Requests Normal   Final    Culture  Setup Time [id number]   Final    Colony Count >=100,000 COLONIES/ML   Final    Culture ESCHERICHIA COLI   Final    Report Status 08/14/2011 FINAL   Final    Organism ID, Bacteria ESCHERICHIA COLI   Final   CULTURE, BLOOD (ROUTINE X 2)     Status: Normal (Preliminary result)   Collection Time   08/13/11  2:44 PM      Component Value Range Status Comment   Specimen Description BLOOD LEFT ARM   Final    Special Requests BOTTLES DRAWN AEROBIC AND ANAEROBIC 5CC EACH   Final    Culture  Setup Time [id number]   Final    Culture     Final    Value:        BLOOD CULTURE RECEIVED NO GROWTH TO DATE CULTURE WILL BE HELD FOR 5 DAYS BEFORE ISSUING A FINAL NEGATIVE REPORT   Report Status PENDING   Incomplete   CULTURE, BLOOD (ROUTINE X 2)     Status: Normal (  Preliminary result)   Collection Time   08/13/11  2:45 PM      Component Value Range Status Comment   Specimen Description BLOOD LEFT HAND   Final    Special Requests BOTTLES DRAWN AEROBIC AND ANAEROBIC 3 CC EACH   Final    Culture  Setup Time [id number]   Final    Culture     Final    Value:        BLOOD CULTURE RECEIVED NO GROWTH TO DATE CULTURE WILL BE HELD FOR 5 DAYS BEFORE ISSUING A FINAL NEGATIVE REPORT   Report Status PENDING   Incomplete   MRSA PCR SCREENING     Status: Normal   Collection Time   08/14/11  6:53 PM      Component Value Range Status Comment   MRSA by PCR NEGATIVE  NEGATIVE  Final   CULTURE, BLOOD (ROUTINE X 2)     Status: Normal (Preliminary result)   Collection Time   08/15/11 10:05 AM      Component Value Range Status Comment   Specimen Description BLOOD LEFT ARM   Final    Special Requests BOTTLES DRAWN AEROBIC  AND ANAEROBIC 8CC   Final    Culture  Setup Time [id number]   Final    Culture     Final    Value:        BLOOD CULTURE RECEIVED NO GROWTH TO DATE CULTURE WILL BE HELD FOR 5 DAYS BEFORE ISSUING A FINAL NEGATIVE REPORT   Report Status PENDING   Incomplete   CULTURE, BLOOD (ROUTINE X 2)     Status: Normal (Preliminary result)   Collection Time   08/15/11 10:15 AM      Component Value Range Status Comment   Specimen Description BLOOD LEFT WRIST   Final    Special Requests BOTTLES DRAWN AEROBIC AND ANAEROBIC [facility]   Final    Culture  Setup Time [id number]   Final    Culture     Final    Value:        BLOOD CULTURE RECEIVED NO GROWTH TO DATE CULTURE WILL BE HELD FOR 5 DAYS BEFORE ISSUING A FINAL NEGATIVE REPORT   Report Status PENDING   Incomplete     Studies/Results: Dg Chest Port 1 View  08/15/2011  *RADIOLOGY REPORT*  Clinical Data: Evaluate fever.  Pneumonia.  PORTABLE CHEST - 1 VIEW  Comparison: 02/03/2010  Findings:  Heart size is normal.  There are low lung volumes.  There is diffuse bilateral hazy lung opacities.  No focal areas of consolidation.  IMPRESSION:  1.  Diffuse bilateral hazy lung opacities which may represent interstitial edema or atypical infection.  Original Report Authenticated By: Rosealee Albee, M.D.    Medications: Scheduled Meds:    . enoxaparin  40 mg Subcutaneous Q24H  . hydrocortisone sod succinate (SOLU-CORTEF) injection  50 mg Intravenous Q8H  . levofloxacin (LEVAQUIN) IV  750 mg Intravenous Q24H  . piperacillin-tazobactam (ZOSYN)  IV  3.375 g Intravenous Q8H  . potassium chloride  20 mEq Oral Once  . sodium chloride  500 mL Intravenous Once  . sodium chloride  3 mL Intravenous Q12H  . vancomycin  1,000 mg Intravenous Q8H  . DISCONTD: vancomycin  1,000 mg Intravenous Q12H   Continuous Infusions:    . sodium chloride 150 mL/hr at 08/16/11 0751   PRN Meds:.acetaminophen, acetaminophen, alum & mag hydroxide-simeth, HYDROmorphone, ondansetron  (ZOFRAN) IV, ondansetron, oxyCODONE, SUMAtriptan  Assessment/Plan:  Principal Problem:  *Pyelonephritis  Active Problems:  Ureteral colic  Hypotension  Fever  Nephrolithiasis  Leukocytosis  Tobacco use disorder  SIRS (systemic inflammatory response syndrome)  Migraine  HCAP (healthcare-associated pneumonia)   #1 Sepsis: Presumed 2/2 E. Coli Pyelonephritis. BC are negative to date. Has been afebrile since broadening of abx therapy to vanc/zosyn with normalization of WBCs (despite e coli being sensitive to rocephin which was the initial abx she was on). CXR shows possiblity of PNA. With her cough and sputum production I will add levaquin to cover atypical pathogens. Review of CT abdomen done on 4/21 shows a left ureteral stone with obstruction. Urology has been asked to see patient today (?necessity of stent placement). Also was started on stress-dose steroids given hypotension. Cortisol has returned at 12 which is lower than expected. Will continue hydrocortisone for now.  #2 HA: much improved with imitrex. She has a h/o migraine headaches.  #3 Dispo: move to med surg bed today.   LOS: 4 days   HERNANDEZ ACOSTA,Lizzy Hamre [facility] Pager: [phone] 08/16/2011, 9:59 AM

## 2011-08-16 NOTE — Consult Note (Signed)
Urology Consult   Physician requesting consult: Ardyth Harps  Reason for consult: urosepsis with left ureteral stone  History of Present Illness: Kelli Jenkins is a 51 y.o. female who was admitted via the ED on 08/12/11 for treatment of urosepsis and left ureteral stone.  At time of admission her sx of left flank pain and hematuria had been present for 1 day.  While in the ED she developed fevers and chills.  She was admitted and started on IV ABx.  CT scan revealed a 3mm left UVJ stone with moderate left hydro.  Urine culture was obtained and revealed >100k E Coli.  Since admission she has had hypotension and fevers.  These have improved over the past two days with IVF and IV ABx.  RN and pt state that her urine has been strained with no stone seen.    Currently the pt c/o achy left flank pain and a right sided headache that has also been present for several days.  She denies any hx of previous stones or recurrent UTIs. Occasionally she has SUI with strong cough, otherwise, she denies a history of voiding or storage urinary symptoms, hematuria, UTIs, urolithiasis, GU malignancy/trauma/surgery.  PMH is significant for cervical cancer, migraines, and tobacco abuse.  Past Medical History  Diagnosis Date  . Cancer   . SIRS (systemic inflammatory response syndrome)   . Hypokalemia   . Tobacco abuse   . Nephrolithiasis   . Pyelonephritis     Past Surgical History  Procedure Date  . Abdominal hysterectomy      Current Hospital Medications: Scheduled Meds:   . enoxaparin  40 mg Subcutaneous Q24H  . hydrocortisone sod succinate (SOLU-CORTEF) injection  50 mg Intravenous Q8H  . levofloxacin (LEVAQUIN) IV  750 mg Intravenous Q24H  . piperacillin-tazobactam (ZOSYN)  IV  3.375 g Intravenous Q8H  . potassium chloride  20 mEq Oral Once  . sodium chloride  500 mL Intravenous Once  . sodium chloride  3 mL Intravenous Q12H  . vancomycin  1,000 mg Intravenous Q8H  . DISCONTD: vancomycin  1,000  mg Intravenous Q12H   Continuous Infusions:   . sodium chloride 150 mL/hr at 08/16/11 0751   PRN Meds:.acetaminophen, acetaminophen, alum & mag hydroxide-simeth, HYDROmorphone, ondansetron (ZOFRAN) IV, ondansetron, oxyCODONE, SUMAtriptan  Prior to Admission medications   Medication Sig Start Date End Date Taking? Authorizing Provider  ondansetron (ZOFRAN ODT) 8 MG disintegrating tablet Take 1 tablet (8 mg total) by mouth every 8 (eight) hours as needed for nausea. 08/12/11 08/19/11  Carlisle Beers Molpus, MD  oxyCODONE-acetaminophen (PERCOCET) 5-325 MG per tablet Take 1 tablet by mouth every 4 (four) hours as needed for pain. 08/12/11 08/22/11  Forbes Cellar, MD  Tamsulosin HCl (FLOMAX) 0.4 MG CAPS Take 1 capsule (0.4 mg total) by mouth daily. 08/12/11   Forbes Cellar, MD     Allergies:  Allergies  Allergen Reactions  . Darvocet (Propoxacet-N) Swelling  . Ibuprofen Swelling    History reviewed. No pertinent family history.  Social History:  reports that she has been smoking Cigarettes.  She does not have any smokeless tobacco history on file. She reports that she drinks alcohol. She reports that she does not use illicit drugs.  ROS: A complete review of systems was performed.  All systems are negative except for pertinent findings as noted.  Physical Exam:  Vital signs in last 24 hours: Temp:  [97.9 F (36.6 C)-102.6 F (39.2 C)] 98.1 F (36.7 C) (04/25 0800) Pulse Rate:  [76-113] 80  (  04/25 0800) Resp:  [9-28] 17  (04/25 0800) BP: (73-130)/(28-77) 130/67 mmHg (04/25 0800) SpO2:  [87 %-98 %] 94 % (04/25 0800) General:  Alert and oriented, No acute distress HEENT: Normocephalic, atraumatic Neck: No JVD or lymphadenopathy Cardiovascular: Regular rate and rhythm Lungs: Clear bilaterally Abdomen: Soft, nontender, nondistended, no abdominal masses Back: No CVA tenderness Extremities: No edema Neurologic: Grossly intact  Laboratory Data:   Basename 08/16/11 0330 08/15/11 0345  08/14/11 0450  WBC 8.3 10.1 14.4*  HGB 10.8* 10.6* 11.3*  HCT 31.0* 31.0* 34.0*  PLT 155 127* 118*     Basename 08/16/11 0330 08/15/11 0345 08/14/11 0450  NA 137 132* 134*  K 3.3* 3.5 3.9  CL 103 101 104  GLUCOSE 208* 101* 141*  BUN 11 6 7   CALCIUM 8.7 8.4 8.7  CREATININE 0.93 0.95 1.06     Results for orders placed during the hospital encounter of 08/12/11 (from the past 24 hour(s))  VANCOMYCIN, TROUGH     Status: Abnormal   Collection Time   08/15/11 10:05 AM      Component Value Range   Vancomycin Tr 5.6 (*) 10.0 - 20.0 (ug/mL)  CULTURE, BLOOD (ROUTINE X 2)     Status: Normal (Preliminary result)   Collection Time   08/15/11 10:05 AM      Component Value Range   Specimen Description BLOOD LEFT ARM     Special Requests BOTTLES DRAWN AEROBIC AND ANAEROBIC 8CC     Culture  Setup Time [id number]     Culture       Value:        BLOOD CULTURE RECEIVED NO GROWTH TO DATE CULTURE WILL BE HELD FOR 5 DAYS BEFORE ISSUING A FINAL NEGATIVE REPORT   Report Status PENDING    CORTISOL     Status: Normal   Collection Time   08/15/11 10:05 AM      Component Value Range   Cortisol, Plasma 12.6    CULTURE, BLOOD (ROUTINE X 2)     Status: Normal (Preliminary result)   Collection Time   08/15/11 10:15 AM      Component Value Range   Specimen Description BLOOD LEFT WRIST     Special Requests BOTTLES DRAWN AEROBIC AND ANAEROBIC 6CC     Culture  Setup Time [id number]     Culture       Value:        BLOOD CULTURE RECEIVED NO GROWTH TO DATE CULTURE WILL BE HELD FOR 5 DAYS BEFORE ISSUING A FINAL NEGATIVE REPORT   Report Status PENDING    BASIC METABOLIC PANEL     Status: Abnormal   Collection Time   08/16/11  3:30 AM      Component Value Range   Sodium 137  135 - 145 (mEq/L)   Potassium 3.3 (*) 3.5 - 5.1 (mEq/L)   Chloride 103  96 - 112 (mEq/L)   CO2 24  19 - 32 (mEq/L)   Glucose, Bld 208 (*) 70 - 99 (mg/dL)   BUN 11  6 - 23 (mg/dL)   Creatinine, Ser 2.84  0.50 - 1.10 (mg/dL)    Calcium 8.7  8.4 - 10.5 (mg/dL)   GFR calc non Af Amer 69 (*) >90 (mL/min)   GFR calc Af Amer 80 (*) >90 (mL/min)  CBC     Status: Abnormal   Collection Time   08/16/11  3:30 AM      Component Value Range   WBC 8.3  4.0 - 10.5 (K/uL)  RBC 3.34 (*) 3.87 - 5.11 (MIL/uL)   Hemoglobin 10.8 (*) 12.0 - 15.0 (g/dL)   HCT 16.1 (*) 09.6 - 46.0 (%)   MCV 92.8  78.0 - 100.0 (fL)   MCH 32.3  26.0 - 34.0 (pg)   MCHC 34.8  30.0 - 36.0 (g/dL)   RDW 04.5  40.9 - 81.1 (%)   Platelets 155  150 - 400 (K/uL)   Recent Results (from the past 240 hour(s))  URINE CULTURE     Status: Normal   Collection Time   08/12/11  4:31 AM      Component Value Range Status Comment   Specimen Description URINE, CLEAN CATCH   Final    Special Requests Normal   Final    Culture  Setup Time [id number]   Final    Colony Count >=100,000 COLONIES/ML   Final    Culture ESCHERICHIA COLI   Final    Report Status 08/14/2011 FINAL   Final    Organism ID, Bacteria ESCHERICHIA COLI   Final   CULTURE, BLOOD (ROUTINE X 2)     Status: Normal (Preliminary result)   Collection Time   08/13/11  2:44 PM      Component Value Range Status Comment   Specimen Description BLOOD LEFT ARM   Final    Special Requests BOTTLES DRAWN AEROBIC AND ANAEROBIC 5CC EACH   Final    Culture  Setup Time [id number]   Final    Culture     Final    Value:        BLOOD CULTURE RECEIVED NO GROWTH TO DATE CULTURE WILL BE HELD FOR 5 DAYS BEFORE ISSUING A FINAL NEGATIVE REPORT   Report Status PENDING   Incomplete   CULTURE, BLOOD (ROUTINE X 2)     Status: Normal (Preliminary result)   Collection Time   08/13/11  2:45 PM      Component Value Range Status Comment   Specimen Description BLOOD LEFT HAND   Final    Special Requests BOTTLES DRAWN AEROBIC AND ANAEROBIC 3 CC EACH   Final    Culture  Setup Time [id number]   Final    Culture     Final    Value:        BLOOD CULTURE RECEIVED NO GROWTH TO DATE CULTURE WILL BE HELD FOR 5 DAYS BEFORE ISSUING A  FINAL NEGATIVE REPORT   Report Status PENDING   Incomplete   MRSA PCR SCREENING     Status: Normal   Collection Time   08/14/11  6:53 PM      Component Value Range Status Comment   MRSA by PCR NEGATIVE  NEGATIVE  Final   CULTURE, BLOOD (ROUTINE X 2)     Status: Normal (Preliminary result)   Collection Time   08/15/11 10:05 AM      Component Value Range Status Comment   Specimen Description BLOOD LEFT ARM   Final    Special Requests BOTTLES DRAWN AEROBIC AND ANAEROBIC 8CC   Final    Culture  Setup Time [id number]   Final    Culture     Final    Value:        BLOOD CULTURE RECEIVED NO GROWTH TO DATE CULTURE WILL BE HELD FOR 5 DAYS BEFORE ISSUING A FINAL NEGATIVE REPORT   Report Status PENDING   Incomplete   CULTURE, BLOOD (ROUTINE X 2)     Status: Normal (Preliminary result)   Collection Time   08/15/11 10:15 AM  Component Value Range Status Comment   Specimen Description BLOOD LEFT WRIST   Final    Special Requests BOTTLES DRAWN AEROBIC AND ANAEROBIC [facility]   Final    Culture  Setup Time [id number]   Final    Culture     Final    Value:        BLOOD CULTURE RECEIVED NO GROWTH TO DATE CULTURE WILL BE HELD FOR 5 DAYS BEFORE ISSUING A FINAL NEGATIVE REPORT   Report Status PENDING   Incomplete     Renal Function:  Basename 08/16/11 0330 08/15/11 0345 08/14/11 0450 08/13/11 0428 08/12/11 2030 08/12/11 0415  CREATININE 0.93 0.95 1.06 1.08 1.27* 1.06   Estimated Creatinine Clearance: 83.9 ml/min (by C-G formula based on Cr of 0.93).  Radiologic Imaging: Dg Chest Port 1 View  08/15/2011  *RADIOLOGY REPORT*  Clinical Data: Evaluate fever.  Pneumonia.  PORTABLE CHEST - 1 VIEW  Comparison: 02/03/2010  Findings:  Heart size is normal.  There are low lung volumes.  There is diffuse bilateral hazy lung opacities.  No focal areas of consolidation.  IMPRESSION:  1.  Diffuse bilateral hazy lung opacities which may represent interstitial edema or atypical infection.  Original Report  Authenticated By: Rosealee Albee, M.D.    I independently reviewed the above imaging studies.  Impression/Assessment:  Pyelonephritis with left ureteral stone causing urosepsis which is clinically improving  Plan:  Since there is no evidence the pt has passed the stone, will place left DJ stent today to ensure proper drainage.  Continue IV ABx  YARBROUGH,Natayla Cadenhead G. 08/16/2011, 9:49 AM

## 2011-08-16 NOTE — Progress Notes (Signed)
CARE MANAGE MENT UTILIZATION REVIEW NOTE 08/16/2011     Patient:  Kelli Jenkins, Kelli Jenkins   Account Number:  [id number]  Documented by:  Jiles Crocker   Per Ur Regulation Reviewed for med. necessity/level of care/duration of stay

## 2011-08-16 NOTE — Progress Notes (Addendum)
ANTIBIOTIC CONSULT NOTE  Pharmacy Consult for Levaquin (New), Vancomycin/Zosyn Indication: Sepsis/UTI/PNA  Allergies  Allergen Reactions  . Darvocet (Propoxacet-N) Swelling  . Ibuprofen Swelling    Patient Measurements: Height: 5\' 8"  (172.7 cm) Weight: 202 lb 13.2 oz (92 kg) IBW/kg (Calculated) : 63.9   Vital Signs: Temp: 98.1 F (36.7 C) (04/25 0800) Temp src: Oral (04/25 0800) BP: 130/67 mmHg (04/25 0800) Pulse Rate: 80  (04/25 0800) Intake/Output from previous day: 04/24 0701 - 04/25 0700 In: 6905 [P.O.:2280; I.V.:3150; IV Piggyback:1475] Out: 5200 [Urine:5200] Intake/Output from this shift: Total I/O In: 565 [P.O.:240; I.V.:300; IV Piggyback:25] Out: -   Labs:  Basename 08/16/11 0330 08/15/11 0345 08/14/11 0450  WBC 8.3 10.1 14.4*  HGB 10.8* 10.6* 11.3*  PLT 155 127* 118*  LABCREA -- -- --  CREATININE 0.93 0.95 1.06   Estimated Creatinine Clearance: 83.9 ml/min (by C-G formula based on Cr of 0.93).  Basename 08/15/11 1005  VANCOTROUGH 5.6*  VANCOPEAK --  Drue Dun --  GENTTROUGH --  GENTPEAK --  GENTRANDOM --  TOBRATROUGH --  TOBRAPEAK --  TOBRARND --  AMIKACINPEAK --  AMIKACINTROU --  AMIKACIN --     Microbiology: 4/21 urine: Ecoli (pan-sensitive with the exception of Ampicillin) 4/22 blood: NGTD  Medical History: Past Medical History  Diagnosis Date  . Cancer   . SIRS (systemic inflammatory response syndrome)   . Hypokalemia   . Tobacco abuse   . Nephrolithiasis   . Pyelonephritis     Assessment: 51 yof on day #3 vancomycin and zosyn for sepsis and E.coli UTI.  Pt has cough and sputum production and CXR now also shows possibility of PNA so adding Levaquin to add coverage for atypical organisms.  CrCl~83 ml/min and appears stable.  Also, previous vancomycin trough was low and dose has since been adjusted but would like to recheck a trough level today.  Goal of Therapy:  Vancomycin trough level 15-20 mcg/ml Zosyn/Levaquin doses  adjusted per renal clearance  Plan:  Levaquin 750mg  IV q24h. Check vancomycin trough at 1730 today.  Clance Boll 08/16/2011,10:14 AM   Addendum:  Vancomycin trough tonight was therapeutic at 18.1 on new regimen of vancomycin 1 gm q8h.  Will continue to check levels as needed and monitor Scr.  Andrzej Scully, Loma Messing PharmD 6:45 PM 08/16/2011

## 2011-08-16 NOTE — Transfer of Care (Signed)
Immediate Anesthesia Transfer of Care Note  Patient: Kelli Jenkins  Procedure(s) Performed: Procedure(s) (LRB): CYSTOSCOPY WITH RETROGRADE PYELOGRAM, URETEROSCOPY AND STENT PLACEMENT (Left)  Patient Location: PACU  Anesthesia Type: General  Level of Consciousness: awake, alert , sedated and patient cooperative  Airway & Oxygen Therapy: Patient Spontanous Breathing and Patient connected to face mask oxygen  Post-op Assessment: Report given to PACU RN and Post -op Vital signs reviewed and stable  Post vital signs: Reviewed and stable  Complications: No apparent anesthesia complications

## 2011-08-16 NOTE — Consult Note (Signed)
  See full consult note by Pecola Leisure from the urologic service. I have reviewed the imaging studies in the clinical situation. I slid the patient is imperative that he kidney be unobstructed in the face of potential urosepsis. I've recommended double-J stent placement. In general we will not perform any manipulation of the stone except in circumstances where we think it would be very easy to remove the stone. Full informed consent has been obtained.

## 2011-08-17 LAB — BASIC METABOLIC PANEL
BUN: 11 mg/dL (ref 6–23)
CO2: 26 mEq/L (ref 19–32)
Calcium: 8.8 mg/dL (ref 8.4–10.5)
GFR calc non Af Amer: 71 mL/min — ABNORMAL LOW (ref >90)
Glucose, Bld: 168 mg/dL — ABNORMAL HIGH (ref 70–99)

## 2011-08-17 LAB — CBC
Hemoglobin: 11.3 g/dL — ABNORMAL LOW (ref 12.0–15.0)
MCH: 31.6 pg (ref 26.0–34.0)
MCHC: 33.9 g/dL (ref 30.0–36.0)
MCV: 93 fL (ref 78.0–100.0)
Platelets: 183 10*3/uL (ref 150–400)

## 2011-08-17 MED ORDER — POTASSIUM CHLORIDE CRYS ER 20 MEQ PO TBCR
40.0000 meq | EXTENDED_RELEASE_TABLET | ORAL | Status: AC
  Administered 2011-08-17 (×2): 40 meq via ORAL
  Filled 2011-08-17 (×2): qty 2

## 2011-08-17 NOTE — Progress Notes (Signed)
Patient ID: Kelli Jenkins, female   DOB: August 01, 1960, 51 y.o.   MRN: [id number] 1 Day Post-Op Subjective: Patient reports feeling substantially better. Minimal to no significant left flank pain. She has remained afebrile.  Objective: Vital signs in last 24 hours: Temp:  [97 F (36.1 C)-98.8 F (37.1 C)] 97.9 F (36.6 C) (04/26 1209) Pulse Rate:  [64-100] 70  (04/26 0800) Resp:  [16-25] 18  (04/26 0800) BP: (97-148)/(45-66) 112/61 mmHg (04/26 0800) SpO2:  [93 %-100 %] 93 % (04/26 0800)  Intake/Output from previous day: 04/25 0701 - 04/26 0700 In: 5958 [P.O.:1180; I.V.:3878; IV Piggyback:900] Out: 3925 [Urine:3925] Intake/Output this shift: Total I/O In: 1532.5 [P.O.:720; I.V.:450; IV Piggyback:362.5] Out: 600 [Urine:600]  Physical Exam:  Constitutional: Vital signs reviewed. WD WN in NAD   Eyes: PERRL, No scleral icterus.   Cardiovascular: RRR Pulmonary/Chest: Normal effort Abdominal: Soft. Non-tender Genitourinary: Not examined Extremities: No cyanosis or edema   Lab Results:  Basename 08/17/11 0340 08/16/11 0330 08/15/11 0345  HGB 11.3* 10.8* 10.6*  HCT 33.3* 31.0* 31.0*   BMET  Basename 08/17/11 0340 08/16/11 0330  NA 141 137  K 3.3* 3.3*  CL 106 103  CO2 26 24  GLUCOSE 168* 208*  BUN 11 11  CREATININE 0.91 0.93  CALCIUM 8.8 8.7   No results found for this basename: LABPT:3,INR:3 in the last 72 hours No results found for this basename: LABURIN:1 in the last 72 hours Results for orders placed during the hospital encounter of 08/12/11  URINE CULTURE     Status: Normal   Collection Time   08/12/11  4:31 AM      Component Value Range Status Comment   Specimen Description URINE, CLEAN CATCH   Final    Special Requests Normal   Final    Culture  Setup Time [id number]   Final    Colony Count >=100,000 COLONIES/ML   Final    Culture ESCHERICHIA COLI   Final    Report Status 08/14/2011 FINAL   Final    Organism ID, Bacteria ESCHERICHIA COLI   Final     CULTURE, BLOOD (ROUTINE X 2)     Status: Normal (Preliminary result)   Collection Time   08/13/11  2:44 PM      Component Value Range Status Comment   Specimen Description BLOOD LEFT ARM   Final    Special Requests BOTTLES DRAWN AEROBIC AND ANAEROBIC 5CC EACH   Final    Culture  Setup Time [id number]   Final    Culture     Final    Value:        BLOOD CULTURE RECEIVED NO GROWTH TO DATE CULTURE WILL BE HELD FOR 5 DAYS BEFORE ISSUING A FINAL NEGATIVE REPORT   Report Status PENDING   Incomplete   CULTURE, BLOOD (ROUTINE X 2)     Status: Normal (Preliminary result)   Collection Time   08/13/11  2:45 PM      Component Value Range Status Comment   Specimen Description BLOOD LEFT HAND   Final    Special Requests BOTTLES DRAWN AEROBIC AND ANAEROBIC 3 CC EACH   Final    Culture  Setup Time [id number]   Final    Culture     Final    Value:        BLOOD CULTURE RECEIVED NO GROWTH TO DATE CULTURE WILL BE HELD FOR 5 DAYS BEFORE ISSUING A FINAL NEGATIVE REPORT   Report Status PENDING   Incomplete  MRSA PCR SCREENING     Status: Normal   Collection Time   08/14/11  6:53 PM      Component Value Range Status Comment   MRSA by PCR NEGATIVE  NEGATIVE  Final   CULTURE, BLOOD (ROUTINE X 2)     Status: Normal (Preliminary result)   Collection Time   08/15/11 10:05 AM      Component Value Range Status Comment   Specimen Description BLOOD LEFT ARM   Final    Special Requests BOTTLES DRAWN AEROBIC AND ANAEROBIC 8CC   Final    Culture  Setup Time [id number]   Final    Culture     Final    Value:        BLOOD CULTURE RECEIVED NO GROWTH TO DATE CULTURE WILL BE HELD FOR 5 DAYS BEFORE ISSUING A FINAL NEGATIVE REPORT   Report Status PENDING   Incomplete   CULTURE, BLOOD (ROUTINE X 2)     Status: Normal (Preliminary result)   Collection Time   08/15/11 10:15 AM      Component Value Range Status Comment   Specimen Description BLOOD LEFT WRIST   Final    Special Requests BOTTLES DRAWN AEROBIC AND ANAEROBIC  [facility]   Final    Culture  Setup Time [id number]   Final    Culture     Final    Value:        BLOOD CULTURE RECEIVED NO GROWTH TO DATE CULTURE WILL BE HELD FOR 5 DAYS BEFORE ISSUING A FINAL NEGATIVE REPORT   Report Status PENDING   Incomplete   URINE CULTURE     Status: Normal   Collection Time   08/15/11 10:32 AM      Component Value Range Status Comment   Specimen Description URINE, CLEAN CATCH   Final    Special Requests NONE   Final    Culture  Setup Time [id number]   Final    Colony Count NO GROWTH   Final    Culture NO GROWTH   Final    Report Status 08/16/2011 FINAL   Final     Studies/Results: No results found.  Assessment/Plan:   Patient is doing better status post left double-J stent. If she remains afebrile she can be transitioned to oral antibiotics and discharged hopefully the next step day or 2. We'll need to arrange followup in our office for removal of her double-J stent in approximately 10-14 days. The importance of followup and removal of the stent was discussed with the patient.   LOS: 5 days   Beronica Lansdale S 08/17/2011, 12:37 PM

## 2011-08-17 NOTE — Progress Notes (Signed)
Clinical Social Work Department BRIEF PSYCHOSOCIAL ASSESSMENT 08/17/2011  Patient:  Kelli Jenkins, Kelli Jenkins     Account Number:  [id number]     Admit date:  08/12/2011  Clinical Social Worker:  Jodelle Red  Date/Time:  08/17/2011 11:25 AM  Referred by:  CSW  Date Referred:  08/17/2011 Referred for  Homelessness   Other Referral:   Interview type:  Patient Other interview type:   CHART REVIEW, RN    PSYCHOSOCIAL DATA Living Status:  OTHER Admitted from facility:   Level of care:   Primary support name:  AMBER Primary support relationship to patient:  FRIEND Degree of support available:   VERY LIMITED, NO FAMILY TO ASSIST. PT HAS CUT TIES WITH FAMILY AND IS DIVORCED.    CURRENT CONCERNS Current Concerns  Financial Resources  Other - See comment  Substance Abuse   Other Concerns:   HOUSING, PT IS HOMELESS    SOCIAL WORK ASSESSMENT / PLAN CSW MET WITH PT TO REASSESS NEEDS. ED CSW SAW PT LAST WEEKEND AND PT HAD PLANNED TO GO TO [facility]. PT REQUESTING FINANCIAL ASSISTANCE AND RESOURCES TO ASSIST HER. PT HAD FOOD STAMPS, BUT LET THEM LAPSE. SHE ALSO HAD ORANGE CARD AT [facility], BUT NEVER WENT. PT REPORTS RECENT COCAINE USE AS WELL AND FEEL SHE CAN STOP ON HER OWN. PT SAW BY FINANCIAL COUNSELOR AND WILL NOT MEET MEDICAID CRITERIA. SHE IS AWARE OF HOW TO REAPPLY FOR FOOD STAMPS. CSW PROVIDED LIST OF [facility]S, BUT PT FEELS SHE CAN STAY WITH A FRIEND.   Assessment/plan status:  Referral to [facility] Other assessment/ plan:   REASSESS NEEDS.   Information/referral to community resources:   FINANCIAL COUNSELOR, DSS AND [facility] INFO PROVIDED    PATIENT'S/FAMILY'S RESPONSE TO PLAN OF CARE: PT APPRECIATIVE, EAGER TO CHANGE HER SITUATION. SHE PLANS TO STAY WITH FRIEND, BUT IF PLANS CHANGE CSW WILL ASSIST WITH D/C TO [facility].   Vennie Homans, [place] 08/17/2011 12:27 PM [phone]

## 2011-08-17 NOTE — Progress Notes (Signed)
Subjective: No complaints today. Feels much better. Wants to eat.  Objective: Vital signs in last 24 hours: Temp:  [97 F (36.1 C)-98.8 F (37.1 C)] 98.5 F (36.9 C) (04/26 0857) Pulse Rate:  [64-100] 70  (04/26 0800) Resp:  [16-25] 18  (04/26 0800) BP: (97-148)/(45-70) 112/61 mmHg (04/26 0800) SpO2:  [93 %-100 %] 93 % (04/26 0800) Weight change:  Last BM Date: 08/10/11  Intake/Output from previous day: 04/25 0701 - 04/26 0700 In: 5958 [P.O.:1180; I.V.:3878; IV Piggyback:900] Out: 3925 [Urine:3925] Total I/O In: 522.5 [P.O.:360; IV Piggyback:162.5] Out: -    Physical Exam: General: Alert, awake, oriented x3, in no acute distress. HEENT: No bruits, no goiter. Heart: Regular rate and rhythm, without murmurs, rubs, gallops. Lungs: Clear to auscultation bilaterally. Abdomen: Soft, nontender, nondistended, positive bowel sounds. Extremities: No clubbing cyanosis or edema with positive pedal pulses. Neuro: Grossly intact, nonfocal.    Lab Results: Basic Metabolic Panel:  Basename 08/17/11 0340 08/16/11 0330  NA 141 137  K 3.3* 3.3*  CL 106 103  CO2 26 24  GLUCOSE 168* 208*  BUN 11 11  CREATININE 0.91 0.93  CALCIUM 8.8 8.7  MG -- --  PHOS -- --   Liver Function Tests:  [facility] 08/15/11 0345  AST 23  ALT 29  ALKPHOS 85  BILITOT 0.5  PROT 6.0  ALBUMIN 2.5*   CBC:  Basename 08/17/11 0340 08/16/11 0330  WBC 10.7* 8.3  NEUTROABS -- --  HGB 11.3* 10.8*  HCT 33.3* 31.0*  MCV 93.0 92.8  PLT 183 155   Urine Drug Screen: Drugs of Abuse     Component Value Date/Time   LABOPIA NONE DETECTED 02/01/2011 0800   LABOPIA NEG 05/03/2010 0130   COCAINSCRNUR POSITIVE* 02/01/2011 0800   COCAINSCRNUR POS* 05/03/2010 0130   LABBENZ NONE DETECTED 02/01/2011 0800   LABBENZ NEG 05/03/2010 0130   AMPHETMU NONE DETECTED 02/01/2011 0800   AMPHETMU NEG 05/03/2010 0130   THCU NONE DETECTED 02/01/2011 0800   LABBARB NONE DETECTED 02/01/2011 0800     Recent Results (from  the past 240 hour(s))  URINE CULTURE     Status: Normal   Collection Time   08/12/11  4:31 AM      Component Value Range Status Comment   Specimen Description URINE, CLEAN CATCH   Final    Special Requests Normal   Final    Culture  Setup Time [id number]   Final    Colony Count >=100,000 COLONIES/ML   Final    Culture ESCHERICHIA COLI   Final    Report Status 08/14/2011 FINAL   Final    Organism ID, Bacteria ESCHERICHIA COLI   Final   CULTURE, BLOOD (ROUTINE X 2)     Status: Normal (Preliminary result)   Collection Time   08/13/11  2:44 PM      Component Value Range Status Comment   Specimen Description BLOOD LEFT ARM   Final    Special Requests BOTTLES DRAWN AEROBIC AND ANAEROBIC 5CC EACH   Final    Culture  Setup Time [id number]   Final    Culture     Final    Value:        BLOOD CULTURE RECEIVED NO GROWTH TO DATE CULTURE WILL BE HELD FOR 5 DAYS BEFORE ISSUING A FINAL NEGATIVE REPORT   Report Status PENDING   Incomplete   CULTURE, BLOOD (ROUTINE X 2)     Status: Normal (Preliminary result)   Collection Time   08/13/11  2:45  PM      Component Value Range Status Comment   Specimen Description BLOOD LEFT HAND   Final    Special Requests BOTTLES DRAWN AEROBIC AND ANAEROBIC 3 CC EACH   Final    Culture  Setup Time [id number]   Final    Culture     Final    Value:        BLOOD CULTURE RECEIVED NO GROWTH TO DATE CULTURE WILL BE HELD FOR 5 DAYS BEFORE ISSUING A FINAL NEGATIVE REPORT   Report Status PENDING   Incomplete   MRSA PCR SCREENING     Status: Normal   Collection Time   08/14/11  6:53 PM      Component Value Range Status Comment   MRSA by PCR NEGATIVE  NEGATIVE  Final   CULTURE, BLOOD (ROUTINE X 2)     Status: Normal (Preliminary result)   Collection Time   08/15/11 10:05 AM      Component Value Range Status Comment   Specimen Description BLOOD LEFT ARM   Final    Special Requests BOTTLES DRAWN AEROBIC AND ANAEROBIC 8CC   Final    Culture  Setup Time [id number]   Final     Culture     Final    Value:        BLOOD CULTURE RECEIVED NO GROWTH TO DATE CULTURE WILL BE HELD FOR 5 DAYS BEFORE ISSUING A FINAL NEGATIVE REPORT   Report Status PENDING   Incomplete   CULTURE, BLOOD (ROUTINE X 2)     Status: Normal (Preliminary result)   Collection Time   08/15/11 10:15 AM      Component Value Range Status Comment   Specimen Description BLOOD LEFT WRIST   Final    Special Requests BOTTLES DRAWN AEROBIC AND ANAEROBIC [facility]   Final    Culture  Setup Time [id number]   Final    Culture     Final    Value:        BLOOD CULTURE RECEIVED NO GROWTH TO DATE CULTURE WILL BE HELD FOR 5 DAYS BEFORE ISSUING A FINAL NEGATIVE REPORT   Report Status PENDING   Incomplete   URINE CULTURE     Status: Normal   Collection Time   08/15/11 10:32 AM      Component Value Range Status Comment   Specimen Description URINE, CLEAN CATCH   Final    Special Requests NONE   Final    Culture  Setup Time [id number]   Final    Colony Count NO GROWTH   Final    Culture NO GROWTH   Final    Report Status 08/16/2011 FINAL   Final     Studies/Results: Dg Chest Port 1 View  08/15/2011  *RADIOLOGY REPORT*  Clinical Data: Evaluate fever.  Pneumonia.  PORTABLE CHEST - 1 VIEW  Comparison: 02/03/2010  Findings:  Heart size is normal.  There are low lung volumes.  There is diffuse bilateral hazy lung opacities.  No focal areas of consolidation.  IMPRESSION:  1.  Diffuse bilateral hazy lung opacities which may represent interstitial edema or atypical infection.  Original Report Authenticated By: Rosealee Albee, M.D.    Medications: Scheduled Meds:    . enoxaparin  40 mg Subcutaneous Q24H  . hydrocortisone sod succinate (SOLU-CORTEF) injection  50 mg Intravenous Q8H  . levofloxacin (LEVAQUIN) IV  750 mg Intravenous Q24H  . piperacillin-tazobactam (ZOSYN)  IV  3.375 g Intravenous Q8H  . sodium chloride  3  mL Intravenous Q12H  . vancomycin  1,000 mg Intravenous Q8H   Continuous Infusions:    .  sodium chloride 150 mL/hr at 08/17/11 0930   PRN Meds:.acetaminophen, acetaminophen, alum & mag hydroxide-simeth, HYDROmorphone, ondansetron (ZOFRAN) IV, ondansetron, oxyCODONE, SUMAtriptan, DISCONTD: HYDROmorphone, DISCONTD: iohexol, DISCONTD: sodium chloride irrigation  Assessment/Plan:  Principal Problem:  *Pyelonephritis Active Problems:  Ureteral colic  Hypotension  Fever  Nephrolithiasis  Leukocytosis  Tobacco use disorder  SIRS (systemic inflammatory response syndrome)  Migraine  HCAP (healthcare-associated pneumonia)   #1 Sepsis: Improving. Presumed 2/2 E. Coli Pyelonephritis + HCAP. BC are negative to date. Currently on vanc/zosyn/levaquin. Remains afebrile. She is s/p left ureteral stenting yesterday. Continue stress dose steroids today. If BP stable overnight, may start to wean in am.  #2 HA: resolved with imitrex. She has a h/o migraine headaches.  #3 Dispo: move to med surg bed today.   LOS: 5 days   [facility] Pager: [phone] 08/17/2011, 9:41 AM

## 2011-08-17 NOTE — Progress Notes (Signed)
sleeping

## 2011-08-17 NOTE — Progress Notes (Signed)
Patient transferring to room #1504, report called to Aram Beecham, [place].  Patient will travel by wheelchair.  Will continue to monitor.

## 2011-08-17 NOTE — Progress Notes (Signed)
Received patient transfer, alert oriented without complaints of pain.  She is aware that we need for her to continue to strain her urine when she voids.  States she had a small bowel movement just prior to her transfer.  She was settled into her bed appeared in no apparent distress at this time

## 2011-08-18 LAB — BASIC METABOLIC PANEL
Calcium: 8.7 mg/dL (ref 8.4–10.5)
GFR calc Af Amer: >90 mL/min (ref >90)
GFR calc non Af Amer: 82 mL/min — ABNORMAL LOW (ref >90)
Potassium: 3.3 mEq/L — ABNORMAL LOW (ref 3.5–5.1)
Sodium: 141 mEq/L (ref 135–145)

## 2011-08-18 LAB — CBC
Hemoglobin: 10.3 g/dL — ABNORMAL LOW (ref 12.0–15.0)
MCH: 30.9 pg (ref 26.0–34.0)
MCHC: 33.4 g/dL (ref 30.0–36.0)
Platelets: 236 10*3/uL (ref 150–400)
RBC: 3.33 MIL/uL — ABNORMAL LOW (ref 3.87–5.11)

## 2011-08-18 MED ORDER — POTASSIUM CHLORIDE CRYS ER 20 MEQ PO TBCR
40.0000 meq | EXTENDED_RELEASE_TABLET | ORAL | Status: AC
  Administered 2011-08-18 (×2): 40 meq via ORAL
  Filled 2011-08-18 (×2): qty 2

## 2011-08-18 MED ORDER — LEVOFLOXACIN 750 MG PO TABS
750.0000 mg | ORAL_TABLET | Freq: Every day | ORAL | Status: DC
  Administered 2011-08-19: 750 mg via ORAL
  Filled 2011-08-18 (×2): qty 1

## 2011-08-18 MED ORDER — VITAMINS A & D EX OINT
TOPICAL_OINTMENT | CUTANEOUS | Status: AC
  Administered 2011-08-18: 1
  Filled 2011-08-18: qty 5

## 2011-08-18 MED ORDER — HYDROCORTISONE SOD SUCCINATE 100 MG IJ SOLR
50.0000 mg | Freq: Two times a day (BID) | INTRAMUSCULAR | Status: DC
  Administered 2011-08-18 – 2011-08-19 (×2): 50 mg via INTRAVENOUS
  Filled 2011-08-18 (×4): qty 1

## 2011-08-18 MED ORDER — VITAMINS A & D EX OINT
TOPICAL_OINTMENT | CUTANEOUS | Status: AC
  Filled 2011-08-18: qty 5

## 2011-08-18 NOTE — Progress Notes (Signed)
Cm spoke with pt concerning MD consult for medication assistance. Pt eligible for indigent funds. Pt explained medication assistance eligibility. Pt given information for Social Services and [employer] for assistance with PCP follow-up. Pt advised to apply for medicaid. Pt states previously active with [facility], Pt advised to contact Social Services Monday for [facility] eligibility. Pt given [facility] generic drug list and information concerning http://www.booker.com/ for further medication assistance.   Leonie Green [phone]

## 2011-08-18 NOTE — Progress Notes (Signed)
Subjective: No complaints. Sitting up in a chair. Wearing makeup, feels much better.  Objective: Vital signs in last 24 hours: Temp:  [97.6 F (36.4 C)-98.2 F (36.8 C)] 97.8 F (36.6 C) (04/27 1041) Pulse Rate:  [60-73] 60  (04/27 1041) Resp:  [18-20] 18  (04/27 1041) BP: (105-132)/(57-76) 114/65 mmHg (04/27 1041) SpO2:  [95 %-98 %] 97 % (04/27 1041) Weight change:  Last BM Date: 08/18/11  Intake/Output from previous day: 04/26 0701 - 04/27 0700 In: 4640 [P.O.:840; I.V.:3150; IV Piggyback:650] Out: 600 [Urine:600]     Physical Exam: General: Alert, awake, oriented x3, in no acute distress. HEENT: No bruits, no goiter. Heart: Regular rate and rhythm, without murmurs, rubs, gallops. Lungs: Clear to auscultation bilaterally. Abdomen: Soft, nontender, nondistended, positive bowel sounds. Extremities: No clubbing cyanosis or edema with positive pedal pulses. Neuro: Grossly intact, nonfocal.    Lab Results: Basic Metabolic Panel:  Basename 08/18/11 0616 08/17/11 0340  NA 141 141  K 3.3* 3.3*  CL 106 106  CO2 25 26  GLUCOSE 154* 168*  BUN 12 11  CREATININE 0.81 0.91  CALCIUM 8.7 8.8  MG -- 1.9  PHOS -- --   CBC:  Basename 08/18/11 0616 08/17/11 0340  WBC 10.3 10.7*  NEUTROABS -- --  HGB 10.3* 11.3*  HCT 30.8* 33.3*  MCV 92.5 93.0  PLT 236 183   Urine Drug Screen: Drugs of Abuse     Component Value Date/Time   LABOPIA NONE DETECTED 02/01/2011 0800   LABOPIA NEG 05/03/2010 0130   COCAINSCRNUR POSITIVE* 02/01/2011 0800   COCAINSCRNUR POS* 05/03/2010 0130   LABBENZ NONE DETECTED 02/01/2011 0800   LABBENZ NEG 05/03/2010 0130   AMPHETMU NONE DETECTED 02/01/2011 0800   AMPHETMU NEG 05/03/2010 0130   THCU NONE DETECTED 02/01/2011 0800   LABBARB NONE DETECTED 02/01/2011 0800     Recent Results (from the past 240 hour(s))  URINE CULTURE     Status: Normal   Collection Time   08/12/11  4:31 AM      Component Value Range Status Comment   Specimen Description  URINE, CLEAN CATCH   Final    Special Requests Normal   Final    Culture  Setup Time [id number]   Final    Colony Count >=100,000 COLONIES/ML   Final    Culture ESCHERICHIA COLI   Final    Report Status 08/14/2011 FINAL   Final    Organism ID, Bacteria ESCHERICHIA COLI   Final   CULTURE, BLOOD (ROUTINE X 2)     Status: Normal (Preliminary result)   Collection Time   08/13/11  2:44 PM      Component Value Range Status Comment   Specimen Description BLOOD LEFT ARM   Final    Special Requests BOTTLES DRAWN AEROBIC AND ANAEROBIC 5CC EACH   Final    Culture  Setup Time [id number]   Final    Culture     Final    Value:        BLOOD CULTURE RECEIVED NO GROWTH TO DATE CULTURE WILL BE HELD FOR 5 DAYS BEFORE ISSUING A FINAL NEGATIVE REPORT   Report Status PENDING   Incomplete   CULTURE, BLOOD (ROUTINE X 2)     Status: Normal (Preliminary result)   Collection Time   08/13/11  2:45 PM      Component Value Range Status Comment   Specimen Description BLOOD LEFT HAND   Final    Special Requests BOTTLES DRAWN AEROBIC AND ANAEROBIC 3  CC [facility]   Final    Culture  Setup Time [id number]   Final    Culture     Final    Value:        BLOOD CULTURE RECEIVED NO GROWTH TO DATE CULTURE WILL BE HELD FOR 5 DAYS BEFORE ISSUING A FINAL NEGATIVE REPORT   Report Status PENDING   Incomplete   MRSA PCR SCREENING     Status: Normal   Collection Time   08/14/11  6:53 PM      Component Value Range Status Comment   MRSA by PCR NEGATIVE  NEGATIVE  Final   CULTURE, BLOOD (ROUTINE X 2)     Status: Normal (Preliminary result)   Collection Time   08/15/11 10:05 AM      Component Value Range Status Comment   Specimen Description BLOOD LEFT ARM   Final    Special Requests BOTTLES DRAWN AEROBIC AND ANAEROBIC 8CC   Final    Culture  Setup Time [id number]   Final    Culture     Final    Value:        BLOOD CULTURE RECEIVED NO GROWTH TO DATE CULTURE WILL BE HELD FOR 5 DAYS BEFORE ISSUING A FINAL NEGATIVE REPORT   Report  Status PENDING   Incomplete   CULTURE, BLOOD (ROUTINE X 2)     Status: Normal (Preliminary result)   Collection Time   08/15/11 10:15 AM      Component Value Range Status Comment   Specimen Description BLOOD LEFT WRIST   Final    Special Requests BOTTLES DRAWN AEROBIC AND ANAEROBIC [facility]   Final    Culture  Setup Time [id number]   Final    Culture     Final    Value:        BLOOD CULTURE RECEIVED NO GROWTH TO DATE CULTURE WILL BE HELD FOR 5 DAYS BEFORE ISSUING A FINAL NEGATIVE REPORT   Report Status PENDING   Incomplete   URINE CULTURE     Status: Normal   Collection Time   08/15/11 10:32 AM      Component Value Range Status Comment   Specimen Description URINE, CLEAN CATCH   Final    Special Requests NONE   Final    Culture  Setup Time [id number]   Final    Colony Count NO GROWTH   Final    Culture NO GROWTH   Final    Report Status 08/16/2011 FINAL   Final     Studies/Results: No results found.  Medications: Scheduled Meds:    . enoxaparin  40 mg Subcutaneous Q24H  . hydrocortisone sod succinate (SOLU-CORTEF) injection  50 mg Intravenous Q12H  . levofloxacin  750 mg Oral Daily  . potassium chloride  40 mEq Oral Q4H  . potassium chloride  40 mEq Oral Q4H  . sodium chloride  3 mL Intravenous Q12H  . vitamin A & D      . DISCONTD: hydrocortisone sod succinate (SOLU-CORTEF) injection  50 mg Intravenous Q8H  . DISCONTD: levofloxacin (LEVAQUIN) IV  750 mg Intravenous Q24H  . DISCONTD: piperacillin-tazobactam (ZOSYN)  IV  3.375 g Intravenous Q8H  . DISCONTD: vancomycin  1,000 mg Intravenous Q8H   Continuous Infusions:    . sodium chloride 150 mL/hr at 08/18/11 0420   PRN Meds:.acetaminophen, acetaminophen, alum & mag hydroxide-simeth, HYDROmorphone, ondansetron (ZOFRAN) IV, ondansetron, oxyCODONE, SUMAtriptan  Assessment/Plan:  Principal Problem:  *Pyelonephritis Active Problems:  Ureteral colic  Hypotension  Fever  Nephrolithiasis  Leukocytosis  Tobacco use  disorder  SIRS (systemic inflammatory response syndrome)  Migraine  HCAP (healthcare-associated pneumonia)   #1 Sepsis:Resolved. Presumed 2/2 E. Coli Pyelonephritis + HCAP. BC are negative to date. Currently on vanc/zosyn/levaquin. Remains afebrile. She is s/p left ureteral stenting. Will narrow antibiotics to PO levaquin only which should cover both PNA and urine. Will titrate stress dose steroids.  #2 HA: resolved with imitrex. She has a h/o migraine headaches.  #3 Dispo: anticipate DC home in am.   LOS: 6 days   HERNANDEZ ACOSTA,Samaira Holzworth [facility] Pager: [phone] 08/18/2011, 12:33 PM

## 2011-08-19 LAB — CULTURE, BLOOD (ROUTINE X 2)
Culture  Setup Time: 201304222202
Culture: NO GROWTH

## 2011-08-19 LAB — BASIC METABOLIC PANEL
CO2: 28 mEq/L (ref 19–32)
Calcium: 8.8 mg/dL (ref 8.4–10.5)
GFR calc non Af Amer: 81 mL/min — ABNORMAL LOW (ref >90)
Glucose, Bld: 119 mg/dL — ABNORMAL HIGH (ref 70–99)
Potassium: 3.9 mEq/L (ref 3.5–5.1)
Sodium: 141 mEq/L (ref 135–145)

## 2011-08-19 LAB — CBC
Hemoglobin: 10.6 g/dL — ABNORMAL LOW (ref 12.0–15.0)
MCH: 32.3 pg (ref 26.0–34.0)
Platelets: 291 10*3/uL (ref 150–400)
RBC: 3.28 MIL/uL — ABNORMAL LOW (ref 3.87–5.11)
WBC: 12.1 10*3/uL — ABNORMAL HIGH (ref 4.0–10.5)

## 2011-08-19 MED ORDER — PREDNISONE (PAK) 10 MG PO TABS: 10.0000 mg | ORAL_TABLET | Freq: Every day | ORAL | Status: AC

## 2011-08-19 MED ORDER — LEVOFLOXACIN 750 MG PO TABS: 750.0000 mg | ORAL_TABLET | Freq: Every day | ORAL | Status: AC

## 2011-08-19 MED ORDER — PREDNISONE (PAK) 10 MG PO TABS: 10.0000 mg | ORAL_TABLET | Freq: Every day | ORAL | Status: DC

## 2011-08-19 MED ORDER — LEVOFLOXACIN 750 MG PO TABS: 750.0000 mg | ORAL_TABLET | Freq: Every day | ORAL | Status: DC

## 2011-08-19 MED ORDER — OXYCODONE HCL 5 MG PO TABS: 5.0000 mg | ORAL_TABLET | ORAL | Status: AC | PRN

## 2011-08-19 MED ORDER — VITAMINS A & D EX OINT
TOPICAL_OINTMENT | CUTANEOUS | Status: AC
  Administered 2011-08-19: 5
  Filled 2011-08-19: qty 5

## 2011-08-19 NOTE — Discharge Summary (Signed)
Physician Discharge Summary  Patient ID: Kelli Jenkins MRN: [id number] DOB/AGE: 07/14/1960 51 y.o.  Admit date: 08/12/2011 Discharge date: 08/19/2011  Primary Care Physician:  Pcp Not In System   Discharge Diagnoses:    Principal Problem:  *Pyelonephritis Active Problems:  Ureteral colic  Hypotension  Fever  Nephrolithiasis  Leukocytosis  Tobacco use disorder  SIRS (systemic inflammatory response syndrome)  Migraine  HCAP (healthcare-associated pneumonia)    Medication List  As of 08/19/2011  1:33 PM   STOP taking these medications         penicillin v potassium 500 MG tablet         TAKE these medications         levofloxacin 750 MG tablet   Commonly known as: LEVAQUIN   Take 1 tablet (750 mg total) by mouth daily.      ondansetron 8 MG disintegrating tablet   Commonly known as: ZOFRAN-ODT   Take 1 tablet (8 mg total) by mouth every 8 (eight) hours as needed for nausea.      oxyCODONE 5 MG immediate release tablet   Commonly known as: Oxy IR/ROXICODONE   Take 1 tablet (5 mg total) by mouth every 4 (four) hours as needed.      oxyCODONE-acetaminophen 5-325 MG per tablet   Commonly known as: PERCOCET   Take 1 tablet by mouth every 4 (four) hours as needed for pain.      predniSONE 10 MG tablet   Commonly known as: STERAPRED UNI-PAK   Take 1 tablet (10 mg total) by mouth daily. Take 6 tablets today and then decrease by 1 tablet daily until off.      Tamsulosin HCl 0.4 MG Caps   Commonly known as: FLOMAX   Take 1 capsule (0.4 mg total) by mouth daily.             Disposition and Follow-up:  Will be discharged home today in stable and improved condition. CM has been able to provide patient with medication assistance and with PCP followup information. Patient has been instructed to schedule a followup with Dr. Isabel Caprice as she will need her stents removed in about 14 days.  Consults:  urology Dr. Barron Alvine   Significant Diagnostic Studies:  Ct  Abdomen Pelvis Wo Contrast  08/12/2011  *RADIOLOGY REPORT*  Clinical Data: Left flank and left lower quadrant abdominal pain. Sudden onset.  Vomiting.  CT ABDOMEN AND PELVIS WITHOUT CONTRAST  Technique:  Multidetector CT imaging of the abdomen and pelvis was performed following the standard protocol without intravenous contrast.  Comparison: 06/03/2006  Findings: The lung bases are clear.  There is a 3 mm stone in the distal left ureter just above the ureterovesicle junction with proximal ureterectasis and pyelocaliectasis.  Minimal periureteral stranding.  Changes are consistent with mild to moderate obstruction.  No additional renal, ureteral, or bladder stones are visualized.  The right ureter and collecting system are decompressed.  The bladder is decompressed.  The unenhanced appearance of the liver, spleen, gallbladder, pancreas, adrenal glands, abdominal aorta, retroperitoneal lymph nodes, stomach, small bowel, and colon is unremarkable.  No free air or free fluid in the abdomen.  Pelvis:  No free or loculated pelvic fluid collections.  No abnormal adnexal masses.  Surgical absence of the uterus.  The appendix is normal.  Degenerative changes in the lumbar spine.  IMPRESSION: 3 mm distal left ureteral stone causing mild to moderate proximal obstruction.  Original Report Authenticated By: Marlon Pel, M.D.   Brief  H and P: For complete details please refer to admission H and P, but in brief patient is a 51 y.o. female who presented to the Ed 1 day prior to admission due to complaints of severe left flank pain and hematuria, she was evaluated and underwent a CT scan of the ABD and Pelvis and was found to have a Left Ureteral stone. She was kept in the ED in an attempt to control her pain, and later she developed fevers and chills and hypotension. A urine culture was ordered and she was administered IV rocephin and referred for medical admission. We were asked to admit her for further evaluation and  management.     Hospital Course:  Principal Problem:  *Pyelonephritis Active Problems:  Ureteral colic  Hypotension  Fever  Nephrolithiasis  Leukocytosis  Tobacco use disorder  SIRS (systemic inflammatory response syndrome)  Migraine  HCAP (healthcare-associated pneumonia)   # Sepsis: Resolved. 2/2 E Coli pyelonephritis and HCAP. Will be discharged on PO levaqion to complete 5 more days. Had been on broad spectrum abx while in the hospital for 5 days. Underwent placement of a left ureteral stent by Dr. Isabel Caprice, and this will need to be removed in 14 days. She required stress-dose steroids while in the hospital 2/2 hypotension and a cortisol level of 12, so will be discharged home on a prednisone taper.  Rest of chronic conditions have been stable this hospitalization.  Time spent on Discharge: Greater than 30 minutes.  SignedChaya Jan [facility] Pager: [phone] 08/19/2011, 1:33 PM

## 2011-08-19 NOTE — Progress Notes (Signed)
Cm sent down rx for PO Levaquin 750mg  1 tab daily for 5 days & 10mg  prednisone tabs total of 21. Pharmacy to contact RN when rx ready.   Leonie Green [phone]

## 2011-08-21 LAB — CULTURE, BLOOD (ROUTINE X 2)
Culture  Setup Time: 201304242301
Culture: NO GROWTH

## 2011-12-17 ENCOUNTER — Emergency Department (HOSPITAL_COMMUNITY)
Admission: EM | Admit: 2011-12-17 | Discharge: 2011-12-17 | Disposition: A | Discharge status: Home / Self Care | Payer: Self-pay

## 2011-12-17 NOTE — ED Notes (Signed)
Pt left note at window. Note stated "wait to long, Kelli Jenkins left".

## 2012-08-10 ENCOUNTER — Emergency Department (HOSPITAL_BASED_OUTPATIENT_CLINIC_OR_DEPARTMENT_OTHER)
Admission: EM | Admit: 2012-08-10 | Discharge: 2012-08-10 | Disposition: A | Discharge status: Home / Self Care | Payer: Self-pay | Attending: Emergency Medicine | Admitting: Emergency Medicine

## 2012-08-10 ENCOUNTER — Encounter (HOSPITAL_BASED_OUTPATIENT_CLINIC_OR_DEPARTMENT_OTHER): Payer: Self-pay | Admitting: *Deleted

## 2012-08-10 ENCOUNTER — Emergency Department (HOSPITAL_BASED_OUTPATIENT_CLINIC_OR_DEPARTMENT_OTHER)
Admission: EM | Admit: 2012-08-10 | Discharge: 2012-08-10 | Discharge status: Against Medical Advice | Payer: Self-pay | Attending: Emergency Medicine | Admitting: Emergency Medicine

## 2012-08-10 DIAGNOSIS — K044 Acute apical periodontitis of pulpal origin: Secondary | ICD-10-CM | POA: Insufficient documentation

## 2012-08-10 DIAGNOSIS — H6692 Otitis media, unspecified, left ear: Secondary | ICD-10-CM

## 2012-08-10 DIAGNOSIS — Z8542 Personal history of malignant neoplasm of other parts of uterus: Secondary | ICD-10-CM | POA: Insufficient documentation

## 2012-08-10 DIAGNOSIS — Z8619 Personal history of other infectious and parasitic diseases: Secondary | ICD-10-CM | POA: Insufficient documentation

## 2012-08-10 DIAGNOSIS — K006 Disturbances in tooth eruption: Secondary | ICD-10-CM | POA: Insufficient documentation

## 2012-08-10 DIAGNOSIS — Z862 Personal history of diseases of the blood and blood-forming organs and certain disorders involving the immune mechanism: Secondary | ICD-10-CM | POA: Insufficient documentation

## 2012-08-10 DIAGNOSIS — Z8659 Personal history of other mental and behavioral disorders: Secondary | ICD-10-CM | POA: Insufficient documentation

## 2012-08-10 DIAGNOSIS — K047 Periapical abscess without sinus: Secondary | ICD-10-CM

## 2012-08-10 DIAGNOSIS — F17200 Nicotine dependence, unspecified, uncomplicated: Secondary | ICD-10-CM | POA: Insufficient documentation

## 2012-08-10 DIAGNOSIS — H6690 Otitis media, unspecified, unspecified ear: Secondary | ICD-10-CM | POA: Insufficient documentation

## 2012-08-10 DIAGNOSIS — H9209 Otalgia, unspecified ear: Secondary | ICD-10-CM | POA: Insufficient documentation

## 2012-08-10 DIAGNOSIS — Z87442 Personal history of urinary calculi: Secondary | ICD-10-CM | POA: Insufficient documentation

## 2012-08-10 DIAGNOSIS — Z8639 Personal history of other endocrine, nutritional and metabolic disease: Secondary | ICD-10-CM | POA: Insufficient documentation

## 2012-08-10 DIAGNOSIS — H9210 Otorrhea, unspecified ear: Secondary | ICD-10-CM | POA: Insufficient documentation

## 2012-08-10 DIAGNOSIS — Z87448 Personal history of other diseases of urinary system: Secondary | ICD-10-CM | POA: Insufficient documentation

## 2012-08-10 HISTORY — DX: Cocaine dependence, uncomplicated: F14.20

## 2012-08-10 MED ORDER — IBUPROFEN 800 MG PO TABS: 800.0000 mg | ORAL_TABLET | Freq: Three times a day (TID) | ORAL | Status: DC

## 2012-08-10 MED ORDER — AMOXICILLIN 500 MG PO CAPS: 500.0000 mg | ORAL_CAPSULE | Freq: Three times a day (TID) | ORAL | Status: DC

## 2012-08-10 MED ORDER — IBUPROFEN 800 MG PO TABS
800.0000 mg | ORAL_TABLET | Freq: Once | ORAL | Status: AC
  Administered 2012-08-10: 800 mg via ORAL
  Filled 2012-08-10: qty 1

## 2012-08-10 MED ORDER — AMOXICILLIN 500 MG PO CAPS
500.0000 mg | ORAL_CAPSULE | Freq: Once | ORAL | Status: AC
  Administered 2012-08-10: 500 mg via ORAL
  Filled 2012-08-10: qty 1

## 2012-08-10 NOTE — ED Notes (Signed)
L ear pain/ yellowish drainage for the past three days, patient states it sounds like water is in her ear. No meds taken

## 2012-08-10 NOTE — ED Provider Notes (Signed)
History  This chart was scribed for Kelli Human, MD by Greggory Stallion, ED Scribe. This patient was seen in room MHT13/MHT13 and the patient's care was started at 4:18 PM.   CSN: [id number]  Arrival date & time 08/10/12  1503   First MD Initiated Contact with Patient 08/10/12 1618      Chief Complaint  Patient presents with  . Otalgia     Patient is a 52 y.o. female presenting with ear pain. The history is provided by the patient. No language interpreter was used.  Otalgia Location:  Left Behind ear:  Redness Quality:  Unable to specify Severity:  Moderate Onset quality:  Gradual Duration:  3 days Timing:  Constant Progression:  Worsening Chronicity:  New Relieved by:  Nothing Worsened by:  Nothing tried Ineffective treatments:  None tried Associated symptoms: ear discharge   Associated symptoms: no abdominal pain, no cough, no fever, no headaches, no rash, no sore throat and no vomiting   Risk factors comment:  Recurrent ear infections   HPI Comments:  Kelli Jenkins is a 52 y.o. Female with history of CA who presents to the Emergency Department complaining of moderate, constant left otalgia that radiates up her left face onset 3 days ago. There is associated yellowish ear drainage. Patient states that she has a recent history of recurrent left ear infections. Patient is also complaining of right upper dental discomfort. Pt denies fever, neck pain, sore throat, visual disturbance, CP, cough, SOB, abdominal pain, nausea, emesis, diarrhea, urinary symptoms, back pain, HA, weakness, numbness and rash as associated symptoms. Pt states she smokes 1 pack of cigarettes every 3 days but denies alcohol use. Patient has a history of cocaine addiction and is currently a client at [facility].   Past Medical History  Diagnosis Date  . SIRS (systemic inflammatory response syndrome)   . Hypokalemia   . Tobacco abuse   . Nephrolithiasis   . Pyelonephritis   . Cancer      uterine  . Cocaine addiction     Past Surgical History  Procedure Laterality Date  . Abdominal hysterectomy    . Renal artery stent      stones    History reviewed. No pertinent family history.  History  Substance Use Topics  . Smoking status: Current Every Day Smoker    Types: Cigarettes  . Smokeless tobacco: Not on file  . Alcohol Use: Yes     Comment: occasionally    OB History   Grav Para Term Preterm Abortions TAB SAB Ect Mult Living                  Review of Systems  Constitutional: Negative for fever and chills.  HENT: Positive for ear pain, dental problem and ear discharge. Negative for sore throat.   Eyes: Negative for visual disturbance.  Respiratory: Negative for cough.   Cardiovascular: Negative for chest pain.  Gastrointestinal: Negative for nausea, vomiting and abdominal pain.  Genitourinary: Negative for dysuria and difficulty urinating.  Musculoskeletal: Negative for back pain.  Skin: Negative for rash.  Neurological: Negative for weakness, numbness and headaches.  All other systems reviewed and are negative.    Allergies  Darvocet and Ibuprofen  Home Medications   Current Outpatient Rx  Name  Route  Sig  Dispense  Refill  . Tamsulosin HCl (FLOMAX) 0.4 MG CAPS   Oral   Take 1 capsule (0.4 mg total) by mouth daily.   10 capsule   0  Triage Vitals: BP 88/62  Pulse 62  Temp(Src) 98 F (36.7 C) (Oral)  Resp 14  Ht 5\' 8"  (1.727 m)  Wt 198 lb (89.812 kg)  BMI 30.11 kg/m2  SpO2 100%  Physical Exam  Constitutional: She is oriented to person, place, and time. She appears well-developed and well-nourished.  HENT:  Head: Normocephalic and atraumatic.  Right Ear: Tympanic membrane, external ear and ear canal normal.  Left Ear: Tympanic membrane is erythematous. Tympanic membrane is not perforated.  Mouth/Throat: Oropharynx is clear and moist. Abnormal dentition.  Left TM is erythematous with fluid behind the membrane.  Right  upper third molar tooth is decayed with swelling of the surrounding gingiva.  Eyes: Conjunctivae and EOM are normal. Pupils are equal, round, and reactive to light.  Neck: Normal range of motion. Neck supple.  Cardiovascular: Normal rate and regular rhythm.   Pulmonary/Chest: Effort normal and breath sounds normal.  Musculoskeletal: Normal range of motion.  Lymphadenopathy:    She has no cervical adenopathy.  Neurological: She is alert and oriented to person, place, and time.  Skin: Skin is warm and dry. No rash noted.    ED Course  Procedures (including critical care time)  DIAGNOSTIC STUDIES: Oxygen Saturation is 100% on RA, normal by my interpretation.    COORDINATION OF CARE: 4:45 PM- Will treat for left otitis media and dental infection with amoxicillin and ibuprofen. Pt verbalizes understanding and agrees to plan.    1. Left otitis media   2. Dental infection     I personally performed the services described in this documentation, which was scribed in my presence. The recorded information has been reviewed and is accurate.  Kelli Jenkins, M.D.      Carleene Cooper III, MD 08/11/12 1434

## 2012-08-10 NOTE — ED Notes (Signed)
Pt was here earlier for left ear pain, but left. Now treatment center wants her to be seen. So she returned.

## 2013-06-21 DIAGNOSIS — N390 Urinary tract infection, site not specified: Secondary | ICD-10-CM

## 2013-06-21 HISTORY — DX: Urinary tract infection, site not specified: N39.0

## 2013-07-03 ENCOUNTER — Ambulatory Visit: Payer: Self-pay

## 2013-07-13 ENCOUNTER — Encounter (HOSPITAL_COMMUNITY): Payer: Self-pay | Admitting: Emergency Medicine

## 2013-07-13 ENCOUNTER — Emergency Department (HOSPITAL_COMMUNITY): Payer: No Typology Code available for payment source

## 2013-07-13 ENCOUNTER — Inpatient Hospital Stay (HOSPITAL_COMMUNITY)
Admission: EM | Admit: 2013-07-13 | Discharge: 2013-07-15 | DRG: 690 | Disposition: A | Discharge status: Home / Self Care | Payer: No Typology Code available for payment source | Attending: Internal Medicine | Admitting: Internal Medicine

## 2013-07-13 ENCOUNTER — Inpatient Hospital Stay (HOSPITAL_COMMUNITY): Payer: No Typology Code available for payment source

## 2013-07-13 DIAGNOSIS — R509 Fever, unspecified: Secondary | ICD-10-CM | POA: Diagnosis present

## 2013-07-13 DIAGNOSIS — R6510 Systemic inflammatory response syndrome (SIRS) of non-infectious origin without acute organ dysfunction: Secondary | ICD-10-CM

## 2013-07-13 DIAGNOSIS — B951 Streptococcus, group B, as the cause of diseases classified elsewhere: Secondary | ICD-10-CM | POA: Diagnosis present

## 2013-07-13 DIAGNOSIS — A498 Other bacterial infections of unspecified site: Secondary | ICD-10-CM | POA: Diagnosis present

## 2013-07-13 DIAGNOSIS — D72829 Elevated white blood cell count, unspecified: Secondary | ICD-10-CM | POA: Diagnosis present

## 2013-07-13 DIAGNOSIS — I959 Hypotension, unspecified: Secondary | ICD-10-CM | POA: Diagnosis not present

## 2013-07-13 DIAGNOSIS — F17200 Nicotine dependence, unspecified, uncomplicated: Secondary | ICD-10-CM | POA: Diagnosis present

## 2013-07-13 DIAGNOSIS — M549 Dorsalgia, unspecified: Secondary | ICD-10-CM

## 2013-07-13 DIAGNOSIS — N12 Tubulo-interstitial nephritis, not specified as acute or chronic: Secondary | ICD-10-CM

## 2013-07-13 DIAGNOSIS — N390 Urinary tract infection, site not specified: Principal | ICD-10-CM | POA: Diagnosis present

## 2013-07-13 DIAGNOSIS — K5289 Other specified noninfective gastroenteritis and colitis: Secondary | ICD-10-CM | POA: Diagnosis present

## 2013-07-13 DIAGNOSIS — Z79899 Other long term (current) drug therapy: Secondary | ICD-10-CM

## 2013-07-13 DIAGNOSIS — N23 Unspecified renal colic: Secondary | ICD-10-CM

## 2013-07-13 DIAGNOSIS — J189 Pneumonia, unspecified organism: Secondary | ICD-10-CM

## 2013-07-13 DIAGNOSIS — Z8542 Personal history of malignant neoplasm of other parts of uterus: Secondary | ICD-10-CM

## 2013-07-13 DIAGNOSIS — R197 Diarrhea, unspecified: Secondary | ICD-10-CM

## 2013-07-13 DIAGNOSIS — N200 Calculus of kidney: Secondary | ICD-10-CM

## 2013-07-13 HISTORY — DX: Urinary tract infection, site not specified: N39.0

## 2013-07-13 LAB — CBC WITH DIFFERENTIAL/PLATELET
BASOS ABS: 0 10*3/uL (ref 0.0–0.1)
BASOS PCT: 0 % (ref 0–1)
EOS ABS: 0.1 10*3/uL (ref 0.0–0.7)
Eosinophils Relative: 2 % (ref 0–5)
HCT: 37.8 % (ref 36.0–46.0)
Hemoglobin: 13.3 g/dL (ref 12.0–15.0)
Lymphocytes Relative: 7 % — ABNORMAL LOW (ref 12–46)
Lymphs Abs: 0.4 10*3/uL — ABNORMAL LOW (ref 0.7–4.0)
MCH: 32.6 pg (ref 26.0–34.0)
MCHC: 35.2 g/dL (ref 30.0–36.0)
MCV: 92.6 fL (ref 78.0–100.0)
Monocytes Absolute: 0.2 10*3/uL (ref 0.1–1.0)
Monocytes Relative: 3 % (ref 3–12)
NEUTROS PCT: 89 % — AB (ref 43–77)
Neutro Abs: 5.6 10*3/uL (ref 1.7–7.7)
PLATELETS: 194 10*3/uL (ref 150–400)
RBC: 4.08 MIL/uL (ref 3.87–5.11)
RDW: 13.1 % (ref 11.5–15.5)
WBC: 6.4 10*3/uL (ref 4.0–10.5)

## 2013-07-13 LAB — URINALYSIS, ROUTINE W REFLEX MICROSCOPIC
BILIRUBIN URINE: NEGATIVE
Glucose, UA: NEGATIVE mg/dL
HGB URINE DIPSTICK: NEGATIVE
Ketones, ur: 15 mg/dL — AB
NITRITE: POSITIVE — AB
PH: 6.5 (ref 5.0–8.0)
Protein, ur: NEGATIVE mg/dL
SPECIFIC GRAVITY, URINE: 1.03 (ref 1.005–1.030)
Urobilinogen, UA: 1 mg/dL (ref 0.0–1.0)

## 2013-07-13 LAB — RAPID URINE DRUG SCREEN, HOSP PERFORMED
AMPHETAMINES: NOT DETECTED
BARBITURATES: NOT DETECTED
BENZODIAZEPINES: NOT DETECTED
Cocaine: NOT DETECTED
Opiates: NOT DETECTED
Tetrahydrocannabinol: NOT DETECTED

## 2013-07-13 LAB — COMPREHENSIVE METABOLIC PANEL
ALBUMIN: 3.8 g/dL (ref 3.5–5.2)
ALK PHOS: 71 U/L (ref 39–117)
ALT: 11 U/L (ref 0–35)
AST: 18 U/L (ref 0–37)
BUN: 18 mg/dL (ref 6–23)
CO2: 24 mEq/L (ref 19–32)
Calcium: 8.9 mg/dL (ref 8.4–10.5)
Chloride: 103 mEq/L (ref 96–112)
Creatinine, Ser: 0.77 mg/dL (ref 0.50–1.10)
GFR calc Af Amer: >90 mL/min (ref >90)
GFR calc non Af Amer: >90 mL/min (ref >90)
Glucose, Bld: 105 mg/dL — ABNORMAL HIGH (ref 70–99)
POTASSIUM: 3.8 meq/L (ref 3.7–5.3)
SODIUM: 140 meq/L (ref 137–147)
TOTAL PROTEIN: 7.1 g/dL (ref 6.0–8.3)
Total Bilirubin: 0.6 mg/dL (ref 0.3–1.2)

## 2013-07-13 LAB — URINE MICROSCOPIC-ADD ON

## 2013-07-13 LAB — I-STAT CG4 LACTIC ACID, ED: LACTIC ACID, VENOUS: 0.81 mmol/L (ref 0.5–2.2)

## 2013-07-13 LAB — LIPASE, BLOOD: LIPASE: 30 U/L (ref 11–59)

## 2013-07-13 MED ORDER — ONDANSETRON HCL 4 MG/2ML IJ SOLN
4.0000 mg | Freq: Once | INTRAMUSCULAR | Status: AC
  Administered 2013-07-13: 4 mg via INTRAVENOUS
  Filled 2013-07-13: qty 2

## 2013-07-13 MED ORDER — ONDANSETRON HCL 4 MG PO TABS: 4.0000 mg | ORAL_TABLET | Freq: Four times a day (QID) | ORAL | Status: DC | PRN

## 2013-07-13 MED ORDER — ONDANSETRON HCL 4 MG/2ML IJ SOLN
4.0000 mg | Freq: Four times a day (QID) | INTRAMUSCULAR | Status: DC | PRN
  Administered 2013-07-13 – 2013-07-15 (×3): 4 mg via INTRAVENOUS
  Filled 2013-07-13 (×3): qty 2

## 2013-07-13 MED ORDER — SODIUM CHLORIDE 0.9 % IV BOLUS (SEPSIS)
1000.0000 mL | Freq: Once | INTRAVENOUS | Status: AC
  Administered 2013-07-13: 1000 mL via INTRAVENOUS

## 2013-07-13 MED ORDER — HYDROMORPHONE HCL PF 1 MG/ML IJ SOLN
1.0000 mg | INTRAMUSCULAR | Status: DC | PRN
  Administered 2013-07-13 – 2013-07-14 (×2): 1 mg via INTRAVENOUS
  Filled 2013-07-13 (×2): qty 1

## 2013-07-13 MED ORDER — HYDROCODONE-ACETAMINOPHEN 5-325 MG PO TABS
1.0000 | ORAL_TABLET | ORAL | Status: DC | PRN
  Administered 2013-07-13 – 2013-07-15 (×7): 2 via ORAL
  Filled 2013-07-13 (×7): qty 2

## 2013-07-13 MED ORDER — IOHEXOL 300 MG/ML  SOLN
100.0000 mL | Freq: Once | INTRAMUSCULAR | Status: AC | PRN
  Administered 2013-07-13: 100 mL via INTRAVENOUS

## 2013-07-13 MED ORDER — ALBUTEROL SULFATE HFA 108 (90 BASE) MCG/ACT IN AERS: 2.0000 | INHALATION_SPRAY | Freq: Four times a day (QID) | RESPIRATORY_TRACT | Status: DC | PRN

## 2013-07-13 MED ORDER — SODIUM CHLORIDE 0.9 % IJ SOLN
3.0000 mL | Freq: Two times a day (BID) | INTRAMUSCULAR | Status: DC
  Administered 2013-07-13 – 2013-07-14 (×2): 3 mL via INTRAVENOUS

## 2013-07-13 MED ORDER — CEFTRIAXONE SODIUM 1 G IJ SOLR
1.0000 g | Freq: Once | INTRAMUSCULAR | Status: AC
Start: 2013-07-13 — End: 2013-07-13
  Administered 2013-07-13: 1 g via INTRAVENOUS
  Filled 2013-07-13: qty 10

## 2013-07-13 MED ORDER — PANTOPRAZOLE SODIUM 40 MG PO TBEC
40.0000 mg | DELAYED_RELEASE_TABLET | Freq: Every day | ORAL | Status: DC
  Administered 2013-07-13 – 2013-07-15 (×3): 40 mg via ORAL
  Filled 2013-07-13 (×3): qty 1

## 2013-07-13 MED ORDER — ALBUTEROL SULFATE (2.5 MG/3ML) 0.083% IN NEBU: 2.5000 mg | INHALATION_SOLUTION | Freq: Four times a day (QID) | RESPIRATORY_TRACT | Status: DC | PRN

## 2013-07-13 MED ORDER — FENTANYL CITRATE 0.05 MG/ML IJ SOLN
50.0000 ug | Freq: Once | INTRAMUSCULAR | Status: AC
  Administered 2013-07-13: 50 ug via INTRAVENOUS
  Filled 2013-07-13: qty 2

## 2013-07-13 MED ORDER — SODIUM CHLORIDE 0.9 % IV SOLN
INTRAVENOUS | Status: DC
  Administered 2013-07-13 – 2013-07-14 (×2): via INTRAVENOUS

## 2013-07-13 MED ORDER — IOHEXOL 300 MG/ML  SOLN
25.0000 mL | Freq: Once | INTRAMUSCULAR | Status: AC | PRN
  Administered 2013-07-13: 25 mL via ORAL

## 2013-07-13 MED ORDER — ENOXAPARIN SODIUM 40 MG/0.4ML ~~LOC~~ SOLN
40.0000 mg | SUBCUTANEOUS | Status: DC
  Administered 2013-07-13 – 2013-07-14 (×2): 40 mg via SUBCUTANEOUS
  Filled 2013-07-13 (×3): qty 0.4

## 2013-07-13 NOTE — H&P (Signed)
[facility] History and Physical  Kelli Jenkins:[id number] DOB: [birth year] DOA: 07/13/2013  Referring physician: ED physician PCP: Pcp Not In System   Chief Complaint: fever, lower back pain   HPI:  53 year old female presenting to [facility] ED with main concern one day duration of progressively worsening non bloody vomiting, diarrhea, lower abdominal discomfort, throbbing, 5/10 in severity, non radiating. This has been associated with lower back pain, subjective fevers, chills, poor oral intake, generalized weakness, urinary urgency and frequency. Pt denies similar events in the past, no known sick contacts or exposures, no recent hospitalizations.   In ED, pt noted to be hemodynamically stable, T max = 100.6 F. UA suggestive of UTI. TR[facility] asked to admit for further evaluation and management, pt given Rocephin IV x 1 dose.  Assessment and Plan: Active Problems: Fever - possibly related to UTI vs pyelonephritis but early discitis difficult to exclude - ED doctor discussed with radiologist and suspicion for discitis less likely if other source identified  - continue Rocephin and follow up on urine culture - follow up on CT abd  ? UTI/Pyelo - management with rocephin as noted above - continue IVF, analgesia, antiemetics as needed  - advance diet as pt able to tolerate  Low back pain  - findings noted below - analgesia as needed - may need follow up MRI if pt does not clinically improve with current regimen   Radiological Exams on Admission: Mr Lumbar Spine Wo Contrast  07/13/2013   Signal changes in the L4-L5 disc and adjacent endplates. Favor a degenerative etiology, but early discitis osteomyelitis difficult to exclude. Lack of any epidural or paraspinal inflammation argues against infection at this time. If back pain progresses, recommend repeat lumbar MRI with attention to this area (without and with contrast in that setting would be preferred). Otherwise chronic lumbar disc and  endplate degeneration with no spinal stenosis or convincing neural impingement.     Code Status: Full Family Communication: Pt at bedside Disposition Plan: Admit for further evaluation    Review of Systems:  Constitutional: Negative for diaphoresis.  HENT: Negative for hearing loss, ear pain, nosebleeds, congestion, sore throat, neck pain, tinnitus and ear discharge.   Eyes: Negative for blurred vision, double vision, photophobia, pain, discharge and redness.  Respiratory: Negative for cough, hemoptysis, sputum production, shortness of breath, wheezing and stridor.   Cardiovascular: Negative for chest pain, palpitations, orthopnea, claudication and leg swelling.  Gastrointestinal: Negative for heartburn, constipation, blood in stool and melena.  Genitourinary: Per HPI Musculoskeletal: Negative for myalgias and falls.  Skin: Negative for itching and rash.  Neurological: Negative for tingling, tremors, sensory change, speech change, focal weakness, loss of consciousness and headaches.  Endo/Heme/Allergies: Negative for environmental allergies and polydipsia. Does not bruise/bleed easily.  Psychiatric/Behavioral: Negative for suicidal ideas. The patient is not nervous/anxious.      Past Medical History  Diagnosis Date  . SIRS (systemic inflammatory response syndrome)   . Hypokalemia   . Tobacco abuse   . Cocaine addiction   . Cancer     uterine  . Nephrolithiasis   . Pyelonephritis     Past Surgical History  Procedure Laterality Date  . Abdominal hysterectomy    . Renal artery stent      stones    Social History:  reports that she has been smoking Cigarettes.  She has been smoking about 0.00 packs per day. She does not have any smokeless tobacco history on file. She reports that she drinks alcohol. She  reports that she does not use illicit drugs.  Allergies  Allergen Reactions  . Darvocet [Propoxyphene N-Acetaminophen] Swelling  . Ibuprofen Swelling   No known family  medical history   Prior to Admission medications   Medication Sig Start Date End Date Taking? Authorizing Provider  albuterol (PROVENTIL HFA;VENTOLIN HFA) 108 (90 BASE) MCG/ACT inhaler Inhale 2 puffs into the lungs every 6 (six) hours as needed for wheezing or shortness of breath.   Yes Historical Provider, MD  Cyanocobalamin (B-12 PO) Take 1 tablet by mouth daily.   Yes Historical Provider, MD  esomeprazole (NEXIUM) 20 MG capsule Take 20 mg by mouth daily as needed (for reflux).   Yes Historical Provider, MD    Physical Exam: Filed Vitals:   07/13/13 1400 07/13/13 1430 07/13/13 1451 07/13/13 1617  BP: 92/70 96/57  99/51  Pulse: 84 77    Temp:   100.6 F (38.1 C) 99.4 F (37.4 C)  TempSrc:   Rectal Oral  Resp:    19  Weight:      SpO2: 99% 100%  99%    Physical Exam  Constitutional: Appears well-developed and well-nourished. No distress.  HENT: Normocephalic. External right and left ear normal. Dry MM Eyes: Conjunctivae and EOM are normal. PERRLA, no scleral icterus.  Neck: Normal ROM. Neck supple. No JVD. No tracheal deviation. No thyromegaly.  CVS: RRR, S1/S2 +, no murmurs, no gallops, no carotid bruit.  Pulmonary: Effort and breath sounds normal, no stridor, rhonchi, wheezes, rales.  Abdominal: Soft. BS +,  no distension, tenderness in lower abd quadrants, no rebound or guarding.  Musculoskeletal: Normal range of motion. No edema and no tenderness.  Lymphadenopathy: No lymphadenopathy noted, cervical, inguinal. Neuro: Alert. Normal reflexes, muscle tone coordination. No cranial nerve deficit. Skin: Skin is warm and dry. No rash noted. Not diaphoretic. No erythema. No pallor.  Psychiatric: Normal mood and affect. Behavior, judgment, thought content normal.   Labs on Admission:  Basic Metabolic Panel:  Recent Labs Lab 07/13/13 1226  NA 140  K 3.8  CL 103  CO2 24  GLUCOSE 105*  BUN 18  CREATININE 0.77  CALCIUM 8.9   Liver Function Tests:  Recent Labs Lab  07/13/13 1226  AST 18  ALT 11  ALKPHOS 71  BILITOT 0.6  PROT 7.1  ALBUMIN 3.8    Recent Labs Lab 07/13/13 1226  LIPASE 30   CBC:  Recent Labs Lab 07/13/13 1226  WBC 6.4  NEUTROABS 5.6  HGB 13.3  HCT 37.8  MCV 92.6  PLT 194   EKG: Normal sinus rhythm, no ST/T wave changes  Faye Ramsay, MD  [facility] Pager [phone]  If 7PM-7AM, please contact night-coverage [email or web address] Password [facility] 07/13/2013, 4:54 PM

## 2013-07-13 NOTE — ED Notes (Signed)
Lactic acid results sent by Darrel to primary nurse in [place]

## 2013-07-13 NOTE — ED Provider Notes (Addendum)
CSN: [id number]     Arrival date & time 07/13/13  1017 History   First MD Initiated Contact with Patient 07/13/13 1300     Chief Complaint  Patient presents with  . Emesis  . Diarrhea     (Consider location/radiation/quality/duration/timing/severity/associated sxs/prior Treatment) HPI Comments: 53 year old female presents with vomiting and diarrhea since early this morning, about 10 hours ago. Patient states she's not had any blood in vomit or stools. Patient not any abdominal pain but feels nauseous. Patient states she's been having back pain since yesterday has been acute on chronic over the past several months. She been having left leg weakness has been getting worse over this time. She did have a trouble standing due to weakness. She is unable to get up and go to the bathroom in time and soiled herself last night. She's not feel his incontinence so much has not been able to get to the bathroom. She does endorse some urinary frequency. When asked about dysuria states she does have a pressure sometimes is unable to tell me how long it's been going on. Denies any injury to her back. She's felt feverish and chills since last night but has not taken her temperature.   Past Medical History  Diagnosis Date  . SIRS (systemic inflammatory response syndrome)   . Hypokalemia   . Tobacco abuse   . Nephrolithiasis   . Pyelonephritis   . Cancer     uterine  . Cocaine addiction    Past Surgical History  Procedure Laterality Date  . Abdominal hysterectomy    . Renal artery stent      stones   History reviewed. No pertinent family history. History  Substance Use Topics  . Smoking status: Current Every Day Smoker    Types: Cigarettes  . Smokeless tobacco: Not on file  . Alcohol Use: Yes     Comment: occasionally   OB History   Grav Para Term Preterm Abortions TAB SAB Ect Mult Living                 Review of Systems  Constitutional: Positive for fever and chills.  Gastrointestinal:  Positive for nausea, vomiting and diarrhea. Negative for abdominal pain, blood in stool and abdominal distention.  Genitourinary: Positive for frequency. Negative for dysuria.  Musculoskeletal: Positive for back pain.  Neurological: Positive for weakness. Negative for numbness.  All other systems reviewed and are negative.      Allergies  Darvocet and Ibuprofen  Home Medications   Current Outpatient Rx  Name  Route  Sig  Dispense  Refill  . albuterol (PROVENTIL HFA;VENTOLIN HFA) 108 (90 BASE) MCG/ACT inhaler   Inhalation   Inhale 2 puffs into the lungs every 6 (six) hours as needed for wheezing or shortness of breath.         . Cyanocobalamin (B-12 PO)   Oral   Take 1 tablet by mouth daily.         Marland Kitchen esomeprazole (NEXIUM) 20 MG capsule   Oral   Take 20 mg by mouth daily as needed (for reflux).          BP 77/52  Pulse 94  Temp(Src) 99.5 F (37.5 C) (Oral)  Resp 18  Wt 200 lb (90.719 kg)  SpO2 98% Physical Exam  Nursing note and vitals reviewed. Constitutional: She is oriented to person, place, and time. She appears well-developed and well-nourished. No distress.  HENT:  Head: Normocephalic and atraumatic.  Right Ear: External ear normal.  Left  Ear: External ear normal.  Nose: Nose normal.  Eyes: Right eye exhibits no discharge. Left eye exhibits no discharge.  Cardiovascular: Normal rate, regular rhythm and normal heart sounds.   Pulmonary/Chest: Effort normal and breath sounds normal.  Abdominal: Soft. She exhibits no distension. There is no tenderness.  Musculoskeletal:       Lumbar back: She exhibits tenderness.  Neurological: She is alert and oriented to person, place, and time. She displays no Babinski's sign on the right side. She displays no Babinski's sign on the left side.  Reflex Scores:      Patellar reflexes are 2+ on the right side and 2+ on the left side.      Achilles reflexes are 2+ on the right side. Left lower extremity weaker than right  but able to lift off bed against mild resistance  Skin: Skin is warm and dry.    ED Course  Procedures (including critical care time) Labs Review Labs Reviewed  CBC WITH DIFFERENTIAL - Abnormal; Notable for the following:    Neutrophils Relative % 89 (*)    Lymphocytes Relative 7 (*)    Lymphs Abs 0.4 (*)    All other components within normal limits  COMPREHENSIVE METABOLIC PANEL - Abnormal; Notable for the following:    Glucose, Bld 105 (*)    All other components within normal limits  URINALYSIS, ROUTINE W REFLEX MICROSCOPIC - Abnormal; Notable for the following:    Ketones, ur 15 (*)    Nitrite POSITIVE (*)    Leukocytes, UA TRACE (*)    All other components within normal limits  URINE MICROSCOPIC-ADD ON - Abnormal; Notable for the following:    Bacteria, UA FEW (*)    All other components within normal limits  CULTURE, BLOOD (ROUTINE X 2)  CULTURE, BLOOD (ROUTINE X 2)  URINE CULTURE  LIPASE, BLOOD  I-STAT CG4 LACTIC ACID, ED   Imaging Review Mr Lumbar Spine Wo Contrast  07/13/2013   CLINICAL DATA:  53 year old female with low back pain and saddle numbness. Left lower extremity weakness. Initial encounter.  EXAM: MRI LUMBAR SPINE WITHOUT CONTRAST  TECHNIQUE: Multiplanar, multisequence MR imaging was performed. No intravenous contrast was administered.  COMPARISON:  Lumbar spine radiographs 01/10/2011. CT Abdomen and Pelvis 08/12/2011.  FINDINGS: Normal lumbar segmentation demonstrated in 2012. Chronic straightening of lumbar lordosis. Mild endplate marrow edema at L4-L5, associated with mild increased STIR signal in the disc space, but evidence of vacuum disc on sagittal T1 (series 5, image 6). Likewise there is mild acute and chronic marrow signal change at L5-S1 which appears to be degenerative in nature.  Visualized lower thoracic spinal cord is normal with conus medularis at L1-L2. Visualized posterior paraspinal soft tissues are within normal limits. Negative visualized  abdominal viscera.  T11-T12:  Negative.  T12-L1:  Negative.  L1-L2: Chronic disc space loss. Mild circumferential disc osteophyte complex. There is a small right paracentral disc protrusion with cephalad migration, most apparent on series 7, image 2 and series 4, image 6). No associated stenosis.  L2-L3:  Mild disc bulge.  No stenosis.  L3-L4:  Mild disc bulge.  No stenosis.  L4-L5: Disc signal and endplate signal changes detailed above. Mild circumferential disc bulge. Moderate facet hypertrophy. No epidural inflammation is evident. No paraspinal inflammation identified. No significant stenosis.  L5-S1: Mostly far lateral and anterior disc osteophyte complex. No stenosis.  IMPRESSION: 1. Signal changes in the L4-L5 disc and adjacent endplates. Favor a degenerative etiology, but early discitis osteomyelitis difficult to  exclude. Lack of any epidural or paraspinal inflammation argues against infection at this time. If back pain progresses, recommend repeat lumbar MRI with attention to this area (without and with contrast in that setting would be preferred). 2. 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   Electronically Signed   By: Lars Pinks M.D.   On: 07/13/2013 16:04     EKG Interpretation None      MDM   Final diagnoses:  Fever  Diarrhea  Back pain  UTI (urinary tract infection)    She appears well here, has a blood pressure of 77 in the waiting room. She was resuscitated with fluids. She's not have a rectal temperature of 100.6. She denies any IV drug abuse, with her fever and back pain and left-sided weakness (for 1 month). Due to this MRI obtained, shows DJD vs discitis. Given her fever and back pain will need blood cultures. Appears to also have UTI, with her urinary symptoms could explain fever. Will treat with IV rocephin and admit to hospitalist, who will continue to monitor symptoms. Her BP has stablized in the high  90s, low 100s.    Ephraim Hamburger, MD 07/13/13 1704  Ephraim Hamburger, MD 07/13/13 [phone]

## 2013-07-13 NOTE — ED Notes (Signed)
CT paged. 

## 2013-07-13 NOTE — ED Notes (Signed)
Pt reports late night having vomiting, diarrhea, chills, body aches since last night. Pt with pale lethargic appearance. States nothing stays down.

## 2013-07-13 NOTE — ED Notes (Signed)
Pt still in MRI 

## 2013-07-13 NOTE — ED Notes (Signed)
Pt reassessed; pt c/o chills and N/V at present

## 2013-07-13 NOTE — ED Notes (Signed)
Pt Husband stated he was very hungry and they did not have any money, therefore I gave Husband Soda and crackers.

## 2013-07-14 ENCOUNTER — Encounter (HOSPITAL_COMMUNITY): Payer: Self-pay | Admitting: General Practice

## 2013-07-14 DIAGNOSIS — D72829 Elevated white blood cell count, unspecified: Secondary | ICD-10-CM

## 2013-07-14 DIAGNOSIS — R197 Diarrhea, unspecified: Secondary | ICD-10-CM

## 2013-07-14 DIAGNOSIS — I959 Hypotension, unspecified: Secondary | ICD-10-CM

## 2013-07-14 LAB — BASIC METABOLIC PANEL
BUN: 8 mg/dL (ref 6–23)
CO2: 24 meq/L (ref 19–32)
Calcium: 8.1 mg/dL — ABNORMAL LOW (ref 8.4–10.5)
Chloride: 108 mEq/L (ref 96–112)
Creatinine, Ser: 0.74 mg/dL (ref 0.50–1.10)
GFR calc Af Amer: >90 mL/min (ref >90)
GLUCOSE: 104 mg/dL — AB (ref 70–99)
POTASSIUM: 3.6 meq/L — AB (ref 3.7–5.3)
Sodium: 142 mEq/L (ref 137–147)

## 2013-07-14 LAB — CBC
HCT: 31.2 % — ABNORMAL LOW (ref 36.0–46.0)
HEMOGLOBIN: 10.8 g/dL — AB (ref 12.0–15.0)
MCH: 32.1 pg (ref 26.0–34.0)
MCHC: 34.6 g/dL (ref 30.0–36.0)
MCV: 92.9 fL (ref 78.0–100.0)
Platelets: 147 10*3/uL — ABNORMAL LOW (ref 150–400)
RBC: 3.36 MIL/uL — ABNORMAL LOW (ref 3.87–5.11)
RDW: 13.2 % (ref 11.5–15.5)
WBC: 4.4 10*3/uL (ref 4.0–10.5)

## 2013-07-14 LAB — TROPONIN I

## 2013-07-14 MED ORDER — DEXTROSE 5 % IV SOLN
1.0000 g | Freq: Every day | INTRAVENOUS | Status: DC
  Administered 2013-07-14 – 2013-07-15 (×2): 1 g via INTRAVENOUS
  Filled 2013-07-14 (×2): qty 10

## 2013-07-14 MED ORDER — PROMETHAZINE HCL 25 MG/ML IJ SOLN
12.5000 mg | Freq: Four times a day (QID) | INTRAMUSCULAR | Status: DC | PRN
  Administered 2013-07-14: 12.5 mg via INTRAVENOUS
  Filled 2013-07-14: qty 1

## 2013-07-14 MED ORDER — SODIUM CHLORIDE 0.9 % IV BOLUS (SEPSIS)
1000.0000 mL | Freq: Once | INTRAVENOUS | Status: AC
  Administered 2013-07-14: 1000 mL via INTRAVENOUS

## 2013-07-14 MED ORDER — ASPIRIN-ACETAMINOPHEN-CAFFEINE 250-250-65 MG PO TABS
2.0000 | ORAL_TABLET | Freq: Four times a day (QID) | ORAL | Status: DC | PRN
  Administered 2013-07-14: 2 via ORAL
  Filled 2013-07-14 (×3): qty 2

## 2013-07-14 MED ORDER — POTASSIUM CHLORIDE CRYS ER 20 MEQ PO TBCR
40.0000 meq | EXTENDED_RELEASE_TABLET | Freq: Once | ORAL | Status: AC
  Administered 2013-07-14: 40 meq via ORAL
  Filled 2013-07-14: qty 2

## 2013-07-14 NOTE — Progress Notes (Signed)
Utilization review completed.  

## 2013-07-14 NOTE — Clinical Social Work Note (Signed)
CSW provided patient's husband with 4 meal vouchers to assist with his meals while patient is in the hospital. CSW explained that these will be good unil 07/16/13.  Liz Beach, [place], [place], [phone]

## 2013-07-14 NOTE — Progress Notes (Signed)
PROGRESS NOTE  Kelli Jenkins JME:[id number] DOB: August 02, 1960 DOA: 07/13/2013 PCP: Pcp Not In System  HPI/Subjective: 53 year old female presented to ED on 3/23 with one day duration of non bloody vomiting, diarrhea, lower abdominal discomfort, fever and increased urinary frequency. Patient also reports lower back pain with weakness and shooting pains down left side x past 4 months.  Assessment/Plan:  UTI, GBS and Escherichia coli - Urine culture shows E colli & Group B strep, blood culture pending - IV rocephin   - Continue IVF, analgesia, antiemetics as needed, N/V/D resolving - Patient is afebrile, WBC within normal limits. Will adjust antibiotics according to the culture results.  Gastroenteritis - Likely gastroenteritis not only systemic symptoms secondary to UTI (her husband had the same symptoms). - She still complaining about nausea, start Phenergan. - Ct abdomen showed mild distended jejunal loop, no evidence of obstruction, cannot rule out enteritis. - GI pathogen panel PCR ordered. - advance diet as pt able to tolerate   Low back pain  - findings noted below  - analgesia as needed  - ED doctor discussed with radiologist and suspicion for discitis less likely if other source identified- UTI - may need follow up MRI if pt does not clinically improve with current regimen    Hypotension - BP 87/55 on 3/24, denies dizziness or SOB. - Pt on continuous IV fluids and bolus given prn  DVT Prophylaxis:  subq Lovenox   Code Status: full Family Communication: significant other at bedside  Disposition Plan: remain inpatient  Antibiotics:  Rocephin   Objective: Filed Vitals:   07/13/13 1848 07/13/13 2013 07/14/13 0510 07/14/13 0920  BP:  107/70 90/57 87/55   Pulse:  82 63 73  Temp:  99.7 F (37.6 C) 98 F (36.7 C) 98.2 F (36.8 C)  TempSrc:  Oral Oral Oral  Resp:  18 18 18   Height:  5\' 8"  (1.727 m)    Weight:  97.9 kg (215 lb 13.3 oz)    SpO2: 96% 96% 96% 95%     Intake/Output Summary (Last 24 hours) at 07/14/13 1253 Last data filed at 07/14/13 2297  Gross per 24 hour  Intake      0 ml  Output      1 ml  Net     -1 ml   Filed Weights   07/13/13 1037 07/13/13 2013  Weight: 90.719 kg (200 lb) 97.9 kg (215 lb 13.3 oz)    Exam: General: Well developed, well nourished, NAD, appears stated age  47:  PERR,  No pharyngeal erythema or exudates  Neck: Supple, no JVD, no masses  Cardiovascular: RRR, S1 S2 auscultated, no rubs, murmurs or gallops.   Respiratory: Clear to auscultation bilaterally with equal chest rise  Abdomen: Soft, nontender, nondistended, + bowel sounds  Extremities: warm dry without cyanosis clubbing or edema.  Neuro: AAOx3, decreased sensation in LLE Skin: Without rashes exudates or nodules.   Psych: Normal affect and demeanor with intact judgement and insight   Data Reviewed: Basic Metabolic Panel:  Recent Labs Lab 07/13/13 1226 07/14/13 0431  NA 140 142  K 3.8 3.6*  CL 103 108  CO2 24 24  GLUCOSE 105* 104*  BUN 18 8  CREATININE 0.77 0.74  CALCIUM 8.9 8.1*   Liver Function Tests:  Recent Labs Lab 07/13/13 1226  AST 18  ALT 11  ALKPHOS 71  BILITOT 0.6  PROT 7.1  ALBUMIN 3.8    Recent Labs Lab 07/13/13 1226  LIPASE 30  CBC:  Recent Labs Lab 07/13/13 1226 07/14/13 0431  WBC 6.4 4.4  NEUTROABS 5.6  --   HGB 13.3 10.8*  HCT 37.8 31.2*  MCV 92.6 92.9  PLT 194 147*     Recent Results (from the past 240 hour(s))  URINE CULTURE     Status: None   Collection Time    07/13/13  3:31 PM      Result Value Ref Range Status   Specimen Description URINE, CATHETERIZED   Final   Special Requests NONE   Final   Culture  Setup Time     Final   Value: 07/13/2013 19:01     Performed at [facility]     Final   Value: >=100,000 COLONIES/ML     Performed at [facility]   Culture     Final   Value: ESCHERICHIA COLI     GROUP B STREP(S.AGALACTIAE)ISOLATED      Note: TESTING AGAINST S. AGALACTIAE NOT ROUTINELY PERFORMED DUE TO PREDICTABILITY OF AMP/PEN/VAN SUSCEPTIBILITY.     Performed at [facility]   Report Status PENDING   Incomplete     Studies: Mr Lumbar Spine Wo Contrast  07/13/2013   CLINICAL DATA:  53 year old female with low back pain and saddle numbness. Left lower extremity weakness. Initial encounter.  EXAM: MRI LUMBAR SPINE WITHOUT CONTRAST  TECHNIQUE: Multiplanar, multisequence MR imaging was performed. No intravenous contrast was administered.  COMPARISON:  Lumbar spine radiographs 01/10/2011. CT Abdomen and Pelvis 08/12/2011.  FINDINGS: Normal lumbar segmentation demonstrated in 2012. Chronic straightening of lumbar lordosis. Mild endplate marrow edema at L4-L5, associated with mild increased STIR signal in the disc space, but evidence of vacuum disc on sagittal T1 (series 5, image 6). Likewise there is mild acute and chronic marrow signal change at L5-S1 which appears to be degenerative in nature.  Visualized lower thoracic spinal cord is normal with conus medularis at L1-L2. Visualized posterior paraspinal soft tissues are within normal limits. Negative visualized abdominal viscera.  T11-T12:  Negative.  T12-L1:  Negative.  L1-L2: Chronic disc space loss. Mild circumferential disc osteophyte complex. There is a small right paracentral disc protrusion with cephalad migration, most apparent on series 7, image 2 and series 4, image 6). No associated stenosis.  L2-L3:  Mild disc bulge.  No stenosis.  L3-L4:  Mild disc bulge.  No stenosis.  L4-L5: Disc signal and endplate signal changes detailed above. Mild circumferential disc bulge. Moderate facet hypertrophy. No epidural inflammation is evident. No paraspinal inflammation identified. No significant stenosis.  L5-S1: Mostly far lateral and anterior disc osteophyte complex. No stenosis.  IMPRESSION: 1. Signal changes in the L4-L5 disc and adjacent endplates. Favor a degenerative etiology, but  early discitis osteomyelitis difficult to exclude. Lack of any epidural or paraspinal inflammation argues against infection at this time. If back pain progresses, recommend repeat lumbar MRI with attention to this area (without and with contrast in that setting would be preferred). 2. 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   Electronically Signed   By: Lars Pinks M.D.   On: 07/13/2013 16:04   Ct Abdomen Pelvis W Contrast  07/13/2013   CLINICAL DATA:  Vomiting, diarrhea, chills  EXAM: CT ABDOMEN AND PELVIS WITH CONTRAST  TECHNIQUE: Multidetector CT imaging of the abdomen and pelvis was performed using the standard protocol following bolus administration of intravenous contrast.  CONTRAST:  124mL OMNIPAQUE IOHEXOL 300 MG/ML  SOLN  COMPARISON:  07/12/2011  FINDINGS: Lung bases are unremarkable.  Sagittal images of the spine shows multilevel degenerative changes with disc space flattening and vacuum disc phenomenon.  Liver, pancreas, spleen and adrenals are unremarkable. Kidneys are symmetrical in size and enhancement. No hydronephrosis or hydroureter.  Delayed renal images shows bilateral renal symmetrical excretion.  No calcified gallstones are noted within gallbladder. There is mild distended jejunal loop in mid abdomen without evidence of small bowel obstruction. Mild segmental enteritis cannot be excluded. Mesenteric lymph nodes are noticed surrounding the root of mesentery the largest axial image 34 measures 1 by 0.7 cm not pathologic by size criteria.  There is no evidence of distal colonic obstruction. Contrast material is noted in right colon and transverse colon. The terminal ileum is unremarkable.  There is no pericecal inflammation. The urinary bladder is unremarkable. The patient is status post hysterectomy. Normal appendix partially visualized.  IMPRESSION: 1. There is nonspecific mild gaseous distended jejunal loop in mid  abdomen. Mild segmental enteritis cannot be excluded. Scattered nonspecific mesenteric lymph nodes are noted. This might be reactive. 2. No distal small bowel obstruction. Contrast is noted in right colon and transverse colon. No pericecal inflammation. Unremarkable terminal ileum. Normal appendix. 3. No hydronephrosis or hydroureter. 4. Status post hysterectomy. 5. Degenerative changes thoracolumbar spine.   Electronically Signed   By: Lahoma Crocker M.D.   On: 07/13/2013 19:43    Scheduled Meds: . cefTRIAXone (ROCEPHIN)  IV  1 g Intravenous Daily  . enoxaparin (LOVENOX) injection  40 mg Subcutaneous Q24H  . pantoprazole  40 mg Oral Daily  . sodium chloride  3 mL Intravenous Q12H   Continuous Infusions: . sodium chloride 50 mL/hr at 07/13/13 2055    Active Problems:   UTI (lower urinary tract infection)    Docia Chuck, PA-S  [facility] Pager [phone]. If 7PM-7AM, please contact night-coverage at [email or web address], password [facility] 07/14/2013, 12:53 PM  LOS: 1 day     Addendum  Patient seen and examined, chart and data base reviewed.  I agree with the above assessment and plan.  For full details please see Mrs. Docia Chuck, PA-S note.  The above note reviewed and amended by me   Birdie Hopes, MD [facility] Pager: [phone] 07/14/2013, 2:14 PM

## 2013-07-14 NOTE — Care Management Note (Addendum)
    Page 1 of 1   07/14/2013     12:20:27 PM   CARE MANAGEMENT NOTE 07/14/2013  Patient:  Kelli Jenkins, Kelli Jenkins   Account Number:  [id number]  Date Initiated:  07/14/2013  Documentation initiated by:  Tomi Bamberger  Subjective/Objective Assessment:   dx uti, vomiting, chills, lbp  admit- [facility]. patient has orange card but can not afford meds.     Action/Plan:   Anticipated DC Date:  07/15/2013   Anticipated DC Plan:  [place]  CM consult      Choice offered to / List presented to:             Status of service:  In process, will continue to follow Medicare Important Message given?   (If response is "NO", the following Medicare IM given date fields will be blank) Date Medicare IM given:   Date Additional Medicare IM given:    Discharge Disposition:    Per UR Regulation:    If discussed at Long Length of Stay Meetings, dates discussed:    Comments:  07/14/13 [place], BSN [phone] patient  is recently from [place], now back with spouse, states she has an orange card from [facility] off [address],  but still can not afford to buy any medications, NCM informed MD of this information, it at all possible prescribed meds on $4 list at [facility].  NCM called [facility] to see if patient would be able to use orange card at their pharmacy they stated patient would have to [place] care there first so she would need to go to the [facility].  NCM called the pharmacy at [facility] and spoke with the pharmacist there, pharmacist stated that patient has been getting meds free through the [facility] with Nurse Vaughan Basta.  NCM called IRC and left a message for Vaughan Basta to return call.

## 2013-07-15 DIAGNOSIS — N390 Urinary tract infection, site not specified: Principal | ICD-10-CM

## 2013-07-15 LAB — URINE CULTURE
Colony Count: NO GROWTH
Culture: NO GROWTH

## 2013-07-15 LAB — COMPREHENSIVE METABOLIC PANEL
ALT: 9 U/L (ref 0–35)
AST: 14 U/L (ref 0–37)
Albumin: 2.7 g/dL — ABNORMAL LOW (ref 3.5–5.2)
Alkaline Phosphatase: 52 U/L (ref 39–117)
BUN: 8 mg/dL (ref 6–23)
CO2: 23 mEq/L (ref 19–32)
Calcium: 8 mg/dL — ABNORMAL LOW (ref 8.4–10.5)
Chloride: 109 mEq/L (ref 96–112)
Creatinine, Ser: 0.75 mg/dL (ref 0.50–1.10)
GFR calc Af Amer: >90 mL/min (ref >90)
GFR calc non Af Amer: >90 mL/min (ref >90)
Glucose, Bld: 99 mg/dL (ref 70–99)
Potassium: 4.1 mEq/L (ref 3.7–5.3)
Sodium: 143 mEq/L (ref 137–147)
Total Bilirubin: <0.2 mg/dL — ABNORMAL LOW (ref 0.3–1.2)
Total Protein: 5.4 g/dL — ABNORMAL LOW (ref 6.0–8.3)

## 2013-07-15 LAB — CBC
HCT: 30.1 % — ABNORMAL LOW (ref 36.0–46.0)
Hemoglobin: 10.4 g/dL — ABNORMAL LOW (ref 12.0–15.0)
MCH: 32.5 pg (ref 26.0–34.0)
MCHC: 34.6 g/dL (ref 30.0–36.0)
MCV: 94.1 fL (ref 78.0–100.0)
PLATELETS: 157 10*3/uL (ref 150–400)
RBC: 3.2 MIL/uL — AB (ref 3.87–5.11)
RDW: 13.2 % (ref 11.5–15.5)
WBC: 4.9 10*3/uL (ref 4.0–10.5)

## 2013-07-15 MED ORDER — TRAMADOL HCL 50 MG PO TABS: 50.0000 mg | ORAL_TABLET | Freq: Four times a day (QID) | ORAL | Status: DC | PRN

## 2013-07-15 MED ORDER — ALBUTEROL SULFATE HFA 108 (90 BASE) MCG/ACT IN AERS
2.0000 | INHALATION_SPRAY | Freq: Four times a day (QID) | RESPIRATORY_TRACT | Status: DC | PRN
  Filled 2013-07-15 (×2): qty 6.7

## 2013-07-15 MED ORDER — ALBUTEROL SULFATE HFA 108 (90 BASE) MCG/ACT IN AERS: 2.0000 | INHALATION_SPRAY | Freq: Four times a day (QID) | RESPIRATORY_TRACT | Status: DC | PRN

## 2013-07-15 MED ORDER — OMEPRAZOLE 40 MG PO CPDR: 40.0000 mg | DELAYED_RELEASE_CAPSULE | Freq: Every day | ORAL | Status: DC

## 2013-07-15 MED ORDER — AMPICILLIN 500 MG PO CAPS: 500.0000 mg | ORAL_CAPSULE | Freq: Three times a day (TID) | ORAL | Status: DC

## 2013-07-15 MED ORDER — HYDROCODONE-ACETAMINOPHEN 5-325 MG PO TABS: 1.0000 | ORAL_TABLET | Freq: Four times a day (QID) | ORAL | Status: DC | PRN

## 2013-07-15 MED ORDER — SODIUM CHLORIDE 0.9 % IV BOLUS (SEPSIS)
500.0000 mL | Freq: Once | INTRAVENOUS | Status: AC
  Administered 2013-07-15: 500 mL via INTRAVENOUS

## 2013-07-15 NOTE — Discharge Summary (Addendum)
Physician Discharge Summary  Kelli Jenkins WYO:[id number] DOB: November 17, 1960 DOA: 07/13/2013  PCP: Pcp Not In System  Admit date: 07/13/2013 Discharge date: 07/15/2013  Time spent: 45 minutes  Recommendations for Outpatient Follow-up:  1. Follow up with PCP. As planned on 07/30/13; Sooner if symptoms worsen or return 2. if back pain worsens, consider repeat MRI.  Brief hospital course  UTI, GBS and Escherichia coli  -Urine culture shows E. coli & Group B strep -N/V/D and urinary burning and frequency has resolved -Patient is afebrile, WBC within normal limits -Pt received ceftriaxone inpatient and will D/C on ampicillin for a few more days.   Gastroenteritis  -Likely gastroenteritis not systemic symptoms secondary to UTI (her husband had the same symptoms)  -Nausea and vomiting have subsided with antiemetics and other supportive care. -Patient now tolerating food and without diarrhea for 24 hours, abdomen exam is benign.  Low back pain  -MRI findings noted below  -May need follow up MRI if pt does not clinically improve with current regimen  - Explained to patient that further workup will be need to be done in the outpatient setting  Asymptomatic Hypotension  -Patient with BP's 80's-90's/50-60's this admission -Denies dizziness or SOB- suspect this is her baseline.  Discharge Condition: Stable  Diet recommendation: Regular  Filed Weights   07/13/13 1037 07/13/13 2013  Weight: 90.719 kg (200 lb) 97.9 kg (215 lb 13.3 oz)    History of present illness:  Today patient says she feels well. She has not had any N/V/D since yesterday. Her nausea is much improved and she tolerated breakfast. She remains afebrile. She is still having some low back pain that is relieved with medication.    Procedures:  None  Consultations:  None  Discharge Exam: Filed Vitals:   07/15/13 0531  BP: 88/54  Pulse: 48  Temp: 97.6 F (36.4 C)  Resp: 18    General: Well developed, well  nourished, NAD, appears stated age  53: PERR, EOMI, MMM Neck: Supple, no JVD, no masses  Cardiovascular: RRR, S1 S2 auscultated, no rubs, murmurs or gallops.  Respiratory: Clear to auscultation bilaterally with equal chest rise  Abdomen: Soft, nontender, nondistended, + bowel sounds  Extremities: warm dry without cyanosis clubbing or edema.  Neuro: AAOx3 Skin: Without rashes exudates or nodules.  Psych: Normal affect and demeanor with intact judgement and insight  Discharge Instructions      Discharge Orders   Future Orders Complete By Expires   Call MD for:  persistant nausea and vomiting  As directed    Call MD for:  severe uncontrolled pain  As directed    Diet general  As directed    Increase activity slowly  As directed        Medication List    STOP taking these medications       B-12 PO     esomeprazole 20 MG capsule  Commonly known as:  [place] these medications       albuterol 108 (90 BASE) MCG/ACT inhaler  Commonly known as:  PROVENTIL HFA;VENTOLIN HFA  Inhale 2 puffs into the lungs every 6 (six) hours as needed for wheezing or shortness of breath.     ampicillin 500 MG capsule  Commonly known as:  PRINCIPEN  Take 1 capsule (500 mg total) by mouth 3 (three) times daily.     HYDROcodone-acetaminophen 5-325 MG per tablet  Commonly known as:  NORCO  Take 1 tablet by mouth every 6 (  six) hours as needed for moderate pain.     omeprazole 40 MG capsule  Commonly known as:  PRILOSEC  Take 1 capsule (40 mg total) by mouth daily.       Allergies  Allergen Reactions  . Darvocet [Propoxyphene N-Acetaminophen] Swelling  . Ibuprofen Swelling   Follow-up Information   Follow up On 07/30/2013.   Contact information:   Follow up with Family Medicine Dr. as Planned       The results of significant diagnostics from this hospitalization (including imaging, microbiology, ancillary and laboratory) are listed below for reference.    Significant  Diagnostic Studies: Mr Lumbar Spine Wo Contrast  07/13/2013   CLINICAL DATA:  53 year old female with low back pain and saddle numbness. Left lower extremity weakness. Initial encounter.  EXAM: MRI LUMBAR SPINE WITHOUT CONTRAST  TECHNIQUE: Multiplanar, multisequence MR imaging was performed. No intravenous contrast was administered.  COMPARISON:  Lumbar spine radiographs 01/10/2011. CT Abdomen and Pelvis 08/12/2011.  FINDINGS: Normal lumbar segmentation demonstrated in 2012. Chronic straightening of lumbar lordosis. Mild endplate marrow edema at L4-L5, associated with mild increased STIR signal in the disc space, but evidence of vacuum disc on sagittal T1 (series 5, image 6). Likewise there is mild acute and chronic marrow signal change at L5-S1 which appears to be degenerative in nature.  Visualized lower thoracic spinal cord is normal with conus medularis at L1-L2. Visualized posterior paraspinal soft tissues are within normal limits. Negative visualized abdominal viscera.  T11-T12:  Negative.  T12-L1:  Negative.  L1-L2: Chronic disc space loss. Mild circumferential disc osteophyte complex. There is a small right paracentral disc protrusion with cephalad migration, most apparent on series 7, image 2 and series 4, image 6). No associated stenosis.  L2-L3:  Mild disc bulge.  No stenosis.  L3-L4:  Mild disc bulge.  No stenosis.  L4-L5: Disc signal and endplate signal changes detailed above. Mild circumferential disc bulge. Moderate facet hypertrophy. No epidural inflammation is evident. No paraspinal inflammation identified. No significant stenosis.  L5-S1: Mostly far lateral and anterior disc osteophyte complex. No stenosis.  IMPRESSION: 1. Signal changes in the L4-L5 disc and adjacent endplates. Favor a degenerative etiology, but early discitis osteomyelitis difficult to exclude. Lack of any epidural or paraspinal inflammation argues against infection at this time. If back pain progresses, recommend repeat lumbar  MRI with attention to this area (without and with contrast in that setting would be preferred). 2. 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   Electronically Signed   By: Lars Pinks M.D.   On: 07/13/2013 16:04   Ct Abdomen Pelvis W Contrast  07/13/2013   CLINICAL DATA:  Vomiting, diarrhea, chills  EXAM: CT ABDOMEN AND PELVIS WITH CONTRAST  TECHNIQUE: Multidetector CT imaging of the abdomen and pelvis was performed using the standard protocol following bolus administration of intravenous contrast.  CONTRAST:  123mL OMNIPAQUE IOHEXOL 300 MG/ML  SOLN  COMPARISON:  07/12/2011  FINDINGS: Lung bases are unremarkable.  Sagittal images of the spine shows multilevel degenerative changes with disc space flattening and vacuum disc phenomenon.  Liver, pancreas, spleen and adrenals are unremarkable. Kidneys are symmetrical in size and enhancement. No hydronephrosis or hydroureter.  Delayed renal images shows bilateral renal symmetrical excretion.  No calcified gallstones are noted within gallbladder. There is mild distended jejunal loop in mid abdomen without evidence of small bowel obstruction. Mild segmental enteritis cannot be excluded. Mesenteric lymph nodes are noticed surrounding the root of mesentery  the largest axial image 34 measures 1 by 0.7 cm not pathologic by size criteria.  There is no evidence of distal colonic obstruction. Contrast material is noted in right colon and transverse colon. The terminal ileum is unremarkable.  There is no pericecal inflammation. The urinary bladder is unremarkable. The patient is status post hysterectomy. Normal appendix partially visualized.  IMPRESSION: 1. There is nonspecific mild gaseous distended jejunal loop in mid abdomen. Mild segmental enteritis cannot be excluded. Scattered nonspecific mesenteric lymph nodes are noted. This might be reactive. 2. No distal small bowel obstruction. Contrast is noted in  right colon and transverse colon. No pericecal inflammation. Unremarkable terminal ileum. Normal appendix. 3. No hydronephrosis or hydroureter. 4. Status post hysterectomy. 5. Degenerative changes thoracolumbar spine.   Electronically Signed   By: Lahoma Crocker M.D.   On: 07/13/2013 19:43    Microbiology: Recent Results (from the past 240 hour(s))  URINE CULTURE     Status: None   Collection Time    07/13/13  3:31 PM      Result Value Ref Range Status   Specimen Description URINE, CATHETERIZED   Final   Special Requests NONE   Final   Culture  Setup Time     Final   Value: 07/13/2013 19:01     Performed at [facility]     Final   Value: >=100,000 COLONIES/ML     Performed at [facility]   Culture     Final   Value: ESCHERICHIA COLI     GROUP B STREP(S.AGALACTIAE)ISOLATED     Note: TESTING AGAINST S. AGALACTIAE NOT ROUTINELY PERFORMED DUE TO PREDICTABILITY OF AMP/PEN/VAN SUSCEPTIBILITY.     Performed at [facility]   Report Status 07/15/2013 FINAL   Final   Organism ID, Bacteria ESCHERICHIA COLI   Final  CULTURE, BLOOD (ROUTINE X 2)     Status: None   Collection Time    07/13/13  4:15 PM      Result Value Ref Range Status   Specimen Description BLOOD ARM RIGHT   Final   Special Requests BOTTLES DRAWN AEROBIC AND ANAEROBIC 10CC   Final   Culture  Setup Time     Final   Value: 07/14/2013 03:11     Performed at [facility]   Culture     Final   Value:        BLOOD CULTURE RECEIVED NO GROWTH TO DATE CULTURE WILL BE HELD FOR 5 DAYS BEFORE ISSUING A FINAL NEGATIVE REPORT     Performed at [facility]   Report Status PENDING   Incomplete  CULTURE, BLOOD (ROUTINE X 2)     Status: None   Collection Time    07/13/13  4:21 PM      Result Value Ref Range Status   Specimen Description BLOOD HAND LEFT   Final   Special Requests BOTTLES DRAWN AEROBIC ONLY 10CC   Final   Culture  Setup Time     Final   Value: 07/14/2013 03:10      Performed at [facility]   Culture     Final   Value:        BLOOD CULTURE RECEIVED NO GROWTH TO DATE CULTURE WILL BE HELD FOR 5 DAYS BEFORE ISSUING A FINAL NEGATIVE REPORT     Performed at [facility]   Report Status PENDING   Incomplete  URINE CULTURE     Status: None  Collection Time    07/13/13  8:56 PM      Result Value Ref Range Status   Specimen Description URINE, CLEAN CATCH   Final   Special Requests NONE   Final   Culture  Setup Time     Final   Value: 07/14/2013 03:16     Performed at [place]     Final   Value: NO GROWTH     Performed at [facility]   Culture     Final   Value: NO GROWTH     Performed at [facility]   Report Status 07/15/2013 FINAL   Final     Labs: Basic Metabolic Panel:  Recent Labs Lab 07/13/13 1226 07/14/13 0431 07/15/13 0735  NA 140 142 143  K 3.8 3.6* 4.1  CL 103 108 109  CO2 24 24 23   GLUCOSE 105* 104* 99  BUN 18 8 8   CREATININE 0.77 0.74 0.75  CALCIUM 8.9 8.1* 8.0*   Liver Function Tests:  Recent Labs Lab 07/13/13 1226 07/15/13 0735  AST 18 14  ALT 11 9  ALKPHOS 71 52  BILITOT 0.6 <0.2*  PROT 7.1 5.4*  ALBUMIN 3.8 2.7*    Recent Labs Lab 07/13/13 1226  LIPASE 30   CBC:  Recent Labs Lab 07/13/13 1226 07/14/13 0431 07/15/13 0735  WBC 6.4 4.4 4.9  NEUTROABS 5.6  --   --   HGB 13.3 10.8* 10.4*  HCT 37.8 31.2* 30.1*  MCV 92.6 92.9 94.1  PLT 194 147* 157   Cardiac Enzymes:  Recent Labs Lab 07/14/13 1941  TROPONINI <0.30      Signed:  Ruben Im PA-S [facility] 07/15/2013, 1:50 PM  Attending Seen and examined, agree with the above assessment and plan. She continues to have intermittent right-sided lower back pain that is chronic. No further nausea vomiting or diarrhea. Abdominal exam is very benign. Stable for discharge.  Nena Alexander MD

## 2013-07-15 NOTE — Progress Notes (Signed)
Nsg Discharge Note  Admit Date:  07/13/2013 Discharge date: 07/15/2013   [name] to be D/C'd Home per MD order.  AVS completed.  Copy for chart, and copy for patient signed, and dated. Patient/caregiver able to verbalize understanding.  Discharge Medication:   Medication List    STOP taking these medications       B-12 PO     esomeprazole 20 MG capsule  Commonly known as:  NEXIUM      TAKE these medications       albuterol 108 (90 BASE) MCG/ACT inhaler  Commonly known as:  PROVENTIL HFA;VENTOLIN HFA  Inhale 2 puffs into the lungs every 6 (six) hours as needed for wheezing or shortness of breath.     ampicillin 500 MG capsule  Commonly known as:  PRINCIPEN  Take 1 capsule (500 mg total) by mouth 3 (three) times daily.     HYDROcodone-acetaminophen 5-325 MG per tablet  Commonly known as:  NORCO  Take 1 tablet by mouth every 6 (six) hours as needed for moderate pain.     omeprazole 40 MG capsule  Commonly known as:  PRILOSEC  Take 1 capsule (40 mg total) by mouth daily.        Discharge Assessment: Filed Vitals:   07/15/13 0531  BP: 88/54  Pulse: 48  Temp: 97.6 F (36.4 C)  Resp: 18   Skin clean, dry and intact without evidence of skin break down, no evidence of skin tears noted. IV catheter discontinued intact. Site without signs and symptoms of complications - no redness or edema noted at insertion site, patient denies c/o pain - only slight tenderness at site.  Dressing with slight pressure applied.  D/c Instructions-Education: Discharge instructions given to patient/family with verbalized understanding. D/c education completed with patient/family including follow up instructions, medication list, d/c activities limitations if indicated, with other d/c instructions as indicated by MD - patient able to verbalize understanding, all questions fully answered. Patient instructed to return to ED, call 911, or call MD for any changes in condition.  Patient  escorted via [place], and D/C home via private auto.  Dayle Points, RN 07/15/2013 2:33 PM

## 2013-07-15 NOTE — Discharge Instructions (Signed)
Urinary Tract Infection A urinary tract infection (UTI) can occur any place along the urinary tract. The tract includes the kidneys, ureters, bladder, and urethra. A type of germ called bacteria often causes a UTI. UTIs are often helped with antibiotic medicine.  HOME CARE   If given, take antibiotics as told by your doctor. Finish them even if you start to feel better.  Drink enough fluids to keep your pee (urine) clear or pale yellow.  Avoid tea, drinks with caffeine, and bubbly (carbonated) drinks.  Pee often. Avoid holding your pee in for a long time.  Pee before and after having sex (intercourse).  Wipe from front to back after you poop (bowel movement) if you are a woman. Use each tissue only once. GET HELP RIGHT AWAY IF:   You have back pain.  You have lower belly (abdominal) pain.  You have chills.  You feel sick to your stomach (nauseous).  You throw up (vomit).  Your burning or discomfort with peeing does not go away.  You have a fever.  Your symptoms are not better in 3 days. MAKE SURE YOU:   Understand these instructions.  Will watch your condition.  Will get help right away if you are not doing well or get worse. Document Released: 09/26/2007 Document Revised: 01/02/2012 Document Reviewed: 11/08/2011 [facility] Patient Information 2014 [place], [place].  Viral Gastroenteritis Viral gastroenteritis is also known as stomach flu. This condition affects the stomach and intestinal tract. It can cause sudden diarrhea and vomiting. The illness typically lasts 3 to 8 days. Most people develop an immune response that eventually gets rid of the virus. While this natural response develops, the virus can make you quite ill. CAUSES  Many different viruses can cause gastroenteritis, such as rotavirus or noroviruses. You can catch one of these viruses by consuming contaminated food or water. You may also catch a virus by sharing utensils or other personal items with an  infected person or by touching a contaminated surface. SYMPTOMS  The most common symptoms are diarrhea and vomiting. These problems can cause a severe loss of body fluids (dehydration) and a body salt (electrolyte) imbalance. Other symptoms may include:  Fever.  Headache.  Fatigue.  Abdominal pain. DIAGNOSIS  Your caregiver can usually diagnose viral gastroenteritis based on your symptoms and a physical exam. A stool sample may also be taken to test for the presence of viruses or other infections. TREATMENT  This illness typically goes away on its own. Treatments are aimed at rehydration. The most serious cases of viral gastroenteritis involve vomiting so severely that you are not able to keep fluids down. In these cases, fluids must be given through an intravenous line (IV). HOME CARE INSTRUCTIONS   Drink enough fluids to keep your urine clear or pale yellow. Drink small amounts of fluids frequently and increase the amounts as tolerated.  Ask your caregiver for specific rehydration instructions.  Avoid:  Foods high in sugar.  Alcohol.  Carbonated drinks.  Tobacco.  Juice.  Caffeine drinks.  Extremely hot or cold fluids.  Fatty, greasy foods.  Too much intake of anything at one time.  Dairy products until 24 to 48 hours after diarrhea stops.  You may consume probiotics. Probiotics are active cultures of beneficial bacteria. They may lessen the amount and number of diarrheal stools in adults. Probiotics can be found in yogurt with active cultures and in supplements.  Wash your hands well to avoid spreading the virus.  Only take over-the-counter or prescription medicines  for pain, discomfort, or fever as directed by your caregiver. Do not give aspirin to children. Antidiarrheal medicines are not recommended.  Ask your caregiver if you should continue to take your regular prescribed and over-the-counter medicines.  Keep all follow-up appointments as directed by your  caregiver. SEEK IMMEDIATE MEDICAL CARE IF:   You are unable to keep fluids down.  You do not urinate at least once every 6 to 8 hours.  You develop shortness of breath.  You notice blood in your stool or vomit. This may look like coffee grounds.  You have abdominal pain that increases or is concentrated in one small area (localized).  You have persistent vomiting or diarrhea.  You have a fever.  The patient is a child younger than 3 months, and he or she has a fever.  The patient is a child older than 3 months, and he or she has a fever and persistent symptoms.  The patient is a child older than 3 months, and he or she has a fever and symptoms suddenly get worse.  The patient is a baby, and he or she has no tears when crying. MAKE SURE YOU:   Understand these instructions.  Will watch your condition.  Will get help right away if you are not doing well or get worse. Document Released: 04/09/2005 Document Revised: 07/02/2011 Document Reviewed: 01/24/2011 [facility] Patient Information 2014 [place].

## 2013-07-16 LAB — GI PATHOGEN PANEL BY PCR, STOOL
C difficile toxin A/B: NEGATIVE
Campylobacter by PCR: NEGATIVE
Cryptosporidium by PCR: NEGATIVE
E COLI 0157 BY PCR: NEGATIVE
E coli (ETEC) LT/ST: NEGATIVE
E coli (STEC): NEGATIVE
G LAMBLIA BY PCR: NEGATIVE
Norovirus GI/GII: POSITIVE
ROTAVIRUS A BY PCR: NEGATIVE
SALMONELLA BY PCR: NEGATIVE
SHIGELLA BY PCR: NEGATIVE

## 2013-07-20 LAB — CULTURE, BLOOD (ROUTINE X 2)
CULTURE: NO GROWTH
Culture: NO GROWTH

## 2013-08-20 ENCOUNTER — Other Ambulatory Visit (HOSPITAL_COMMUNITY): Payer: Self-pay | Admitting: Primary Care

## 2013-08-20 DIAGNOSIS — M810 Age-related osteoporosis without current pathological fracture: Secondary | ICD-10-CM

## 2013-08-24 ENCOUNTER — Ambulatory Visit (HOSPITAL_COMMUNITY)
Admission: RE | Admit: 2013-08-24 | Discharge: 2013-08-24 | Disposition: A | Discharge status: Home / Self Care | Payer: No Typology Code available for payment source | Source: Ambulatory Visit | Attending: Primary Care | Admitting: Primary Care

## 2013-08-24 DIAGNOSIS — M810 Age-related osteoporosis without current pathological fracture: Secondary | ICD-10-CM | POA: Insufficient documentation

## 2013-08-24 DIAGNOSIS — Z13820 Encounter for screening for osteoporosis: Secondary | ICD-10-CM | POA: Insufficient documentation

## 2013-08-24 DIAGNOSIS — Z780 Asymptomatic menopausal state: Secondary | ICD-10-CM | POA: Insufficient documentation

## 2013-12-03 ENCOUNTER — Emergency Department (HOSPITAL_COMMUNITY)
Admission: EM | Admit: 2013-12-03 | Discharge: 2013-12-03 | Disposition: A | Discharge status: Home / Self Care | Payer: No Typology Code available for payment source | Attending: Emergency Medicine | Admitting: Emergency Medicine

## 2013-12-03 ENCOUNTER — Emergency Department (HOSPITAL_COMMUNITY)
Admission: EM | Admit: 2013-12-03 | Discharge: 2013-12-03 | Disposition: A | Discharge status: Home / Self Care | Payer: No Typology Code available for payment source

## 2013-12-03 ENCOUNTER — Encounter (HOSPITAL_COMMUNITY): Payer: Self-pay | Admitting: Emergency Medicine

## 2013-12-03 DIAGNOSIS — H6092 Unspecified otitis externa, left ear: Secondary | ICD-10-CM

## 2013-12-03 DIAGNOSIS — Z87891 Personal history of nicotine dependence: Secondary | ICD-10-CM | POA: Insufficient documentation

## 2013-12-03 DIAGNOSIS — Z8639 Personal history of other endocrine, nutritional and metabolic disease: Secondary | ICD-10-CM | POA: Insufficient documentation

## 2013-12-03 DIAGNOSIS — Z87442 Personal history of urinary calculi: Secondary | ICD-10-CM | POA: Insufficient documentation

## 2013-12-03 DIAGNOSIS — K089 Disorder of teeth and supporting structures, unspecified: Secondary | ICD-10-CM | POA: Insufficient documentation

## 2013-12-03 DIAGNOSIS — Z8542 Personal history of malignant neoplasm of other parts of uterus: Secondary | ICD-10-CM | POA: Insufficient documentation

## 2013-12-03 DIAGNOSIS — Z87448 Personal history of other diseases of urinary system: Secondary | ICD-10-CM | POA: Insufficient documentation

## 2013-12-03 DIAGNOSIS — Z87440 Personal history of urinary (tract) infections: Secondary | ICD-10-CM | POA: Insufficient documentation

## 2013-12-03 DIAGNOSIS — Z9089 Acquired absence of other organs: Secondary | ICD-10-CM | POA: Insufficient documentation

## 2013-12-03 DIAGNOSIS — Z792 Long term (current) use of antibiotics: Secondary | ICD-10-CM | POA: Insufficient documentation

## 2013-12-03 DIAGNOSIS — H9209 Otalgia, unspecified ear: Secondary | ICD-10-CM | POA: Insufficient documentation

## 2013-12-03 DIAGNOSIS — H9210 Otorrhea, unspecified ear: Secondary | ICD-10-CM | POA: Insufficient documentation

## 2013-12-03 DIAGNOSIS — Z862 Personal history of diseases of the blood and blood-forming organs and certain disorders involving the immune mechanism: Secondary | ICD-10-CM | POA: Insufficient documentation

## 2013-12-03 DIAGNOSIS — Z9851 Tubal ligation status: Secondary | ICD-10-CM | POA: Insufficient documentation

## 2013-12-03 DIAGNOSIS — H9202 Otalgia, left ear: Secondary | ICD-10-CM

## 2013-12-03 DIAGNOSIS — J3489 Other specified disorders of nose and nasal sinuses: Secondary | ICD-10-CM | POA: Insufficient documentation

## 2013-12-03 DIAGNOSIS — H9212 Otorrhea, left ear: Secondary | ICD-10-CM

## 2013-12-03 DIAGNOSIS — Z79899 Other long term (current) drug therapy: Secondary | ICD-10-CM | POA: Insufficient documentation

## 2013-12-03 DIAGNOSIS — H60399 Other infective otitis externa, unspecified ear: Secondary | ICD-10-CM | POA: Insufficient documentation

## 2013-12-03 MED ORDER — NEOMYCIN-POLYMYXIN-HC 3.5-10000-1 OT SUSP: 4.0000 [drp] | Freq: Three times a day (TID) | OTIC | Status: DC

## 2013-12-03 MED ORDER — ANTIPYRINE-BENZOCAINE 5.4-1.4 % OT SOLN
3.0000 [drp] | OTIC | Status: DC | PRN
  Administered 2013-12-03: 3 [drp] via OTIC
  Filled 2013-12-03: qty 10

## 2013-12-03 MED ORDER — AMOXICILLIN 500 MG PO CAPS: 1000.0000 mg | ORAL_CAPSULE | Freq: Three times a day (TID) | ORAL | Status: DC

## 2013-12-03 NOTE — ED Provider Notes (Signed)
Patient seen/examined in the Emergency Department in conjunction with Midlevel Provider [facility] Patient reports left ear pain Exam : pt with pus noted in left ear canal.  Ears symmetric.  No mastoid tenderness/crepitus Plan: will treat with oral and otic antibiotics   Sharyon Cable, MD 12/03/13 2150

## 2013-12-03 NOTE — Discharge Instructions (Signed)

## 2013-12-03 NOTE — ED Notes (Signed)
Pt left [facility] to come here, states wait there was too long.  Pt reports L ear pain x several days.  Pt reports swelling as well.

## 2013-12-03 NOTE — ED Provider Notes (Signed)
CSN: [id number]     Arrival date & time 12/03/13  2048 History  This chart was scribed for non-physician practitioner working with Sharyon Cable, MD by Mercy Moore, ED Scribe. This patient was seen in room WTR8/WTR8 and the patient's care was started at 9:29 PM.    Chief Complaint  Patient presents with  . Otalgia    The history is provided by the patient. No language interpreter was used.   HPI Comments: Kelli Jenkins is a 53 y.o. female who presents to the Emergency Department complaining of left ear pain, ongoing for 4-5 days now. Patient reports that her pain began as odorous drainage which she states is typical. Patient reports later development of internal swelling described as a pimple visualized by her husband. Last night patient reports worsening or her left ear pain with associated facial and dental pain. Attempted treatment with peroxide, without relief.  Patient denies similar pain, but reports past issues with her left ear including excessive drainage throughout the year. She denies seeing ENT specialist. Patient denies direct injury or causative trauma.  Patient denies history of Diabetes.   Past Medical History  Diagnosis Date  . SIRS (systemic inflammatory response syndrome)   . Hypokalemia   . Tobacco abuse   . Cocaine addiction   . Cancer     uterine  . Nephrolithiasis   . Pyelonephritis   . UTI (urinary tract infection) 06/2013   Past Surgical History  Procedure Laterality Date  . Renal artery stent      stones  . Tubal ligation    . Vaginal hysterectomy     No family history on file. History  Substance Use Topics  . Smoking status: Former Smoker    Types: Cigarettes    Quit date: 06/21/2012  . Smokeless tobacco: Never Used  . Alcohol Use: Yes     Comment: occasionally   OB History   Grav Para Term Preterm Abortions TAB SAB Ect Mult Living                 Review of Systems  Constitutional: Negative for fever and chills.  HENT: Positive for  congestion, dental problem, ear discharge, ear pain, facial swelling and rhinorrhea. Negative for hearing loss.     Allergies  Darvocet and Ibuprofen  Home Medications   Prior to Admission medications   Medication Sig Start Date End Date Taking? Authorizing Provider  albuterol (PROVENTIL HFA;VENTOLIN HFA) 108 (90 BASE) MCG/ACT inhaler Inhale 2 puffs into the lungs every 6 (six) hours as needed for wheezing or shortness of breath. 07/15/13   Jonetta Osgood, MD  amoxicillin (AMOXIL) 500 MG capsule Take 2 capsules (1,000 mg total) by mouth 3 (three) times daily. 12/03/13   Antonietta Breach, PA-C  ampicillin (PRINCIPEN) 500 MG capsule Take 1 capsule (500 mg total) by mouth 3 (three) times daily. 07/15/13   Shanker Kristeen Mans, MD  HYDROcodone-acetaminophen (NORCO) 5-325 MG per tablet Take 1 tablet by mouth every 6 (six) hours as needed for moderate pain. 07/15/13   Shanker Kristeen Mans, MD  neomycin-polymyxin-hydrocortisone (CORTISPORIN) 3.5-10000-1 otic suspension Place 4 drops into the left ear 3 (three) times daily. Use for 10 days 12/03/13   Antonietta Breach, PA-C  omeprazole (PRILOSEC) 40 MG capsule Take 1 capsule (40 mg total) by mouth daily. 07/15/13   Shanker Kristeen Mans, MD   Triage Vitals: BP 103/66  Pulse 72  Temp(Src) 98.4 F (36.9 C) (Oral)  Resp 20  SpO2 97%  Physical Exam  Nursing note and vitals reviewed. Constitutional: She is oriented to person, place, and time. She appears well-developed and well-nourished. No distress.  Nontoxic/nonseptic appearing  HENT:  Head: Normocephalic and atraumatic.  Right Ear: Hearing, tympanic membrane and ear canal normal.  Left Ear: Hearing normal. There is drainage and tenderness.  L ear with mildly erythematous and swollen ear canal. Purulence appreciated in posterior ear canal obstructing clear view of L TM; ? TM rupture. TTP when pulling on L auricle and palpating L tragus. No evidence of mastoiditis b/l.  Eyes: Conjunctivae and EOM are normal. Pupils  are equal, round, and reactive to light. No scleral icterus.  Neck: Normal range of motion. Neck supple.  No nuchal rigidity or meningismus  Pulmonary/Chest: Effort normal. No respiratory distress.  Musculoskeletal: Normal range of motion.  Lymphadenopathy:    She has cervical adenopathy (mild anterior cervical).  Neurological: She is alert and oriented to person, place, and time. She exhibits normal muscle tone. Coordination normal.  Skin: Skin is warm and dry. No rash noted. She is not diaphoretic. No erythema. No pallor.  Psychiatric: She has a normal mood and affect. Her behavior is normal.    ED Course  Procedures (including critical care time)  COORDINATION OF CARE: 9:58 PM- Discussed treatment plan with patient at bedside and patient agreed to plan.   Labs Review Labs Reviewed - No data to display  Imaging Review No results found.   EKG Interpretation None      MDM   Final diagnoses:  Otalgia of left ear  Otitis externa, left  Purulent discharge of ear determined by examination, left    53 year old female presents to the emergency department for a towel to her left ear. Findings suggestive of otitis externa given tenderness on palpating the tragus and pulling on auricle. For now also mildly swollen and erythematous. Posterior ear canal with small amount of purulence. Possible that this is secondary to tympanic membrane rupture, or drainage may be obstructing view of the tympanic membrane. No facial swelling. Hearing grossly intact bilaterally. Will treat patient with Cortisporin suspension and amoxicillin. ENT referral provided. Return precautions discussed and provided. Patient agreeable to plan with no unaddressed concerns. Patient seen also by Dr. Christy Gentles who is in agreement with this assessment, management plan, and patient's stability for discharge.  I personally performed the services described in this documentation, which was scribed in my presence. The recorded  information has been reviewed and is accurate.   Filed Vitals:   12/03/13 2110  BP: 103/66  Pulse: 72  Temp: 98.4 F (36.9 C)  TempSrc: Oral  Resp: 20  SpO2: 97%     Antonietta Breach, PA-C 12/03/13 2204

## 2013-12-03 NOTE — ED Provider Notes (Signed)
Medical screening examination/treatment/procedure(s) were conducted as a shared visit with non-physician practitioner(s) and myself.  I personally evaluated the patient during the encounter.   EKG Interpretation None        Sharyon Cable, MD 12/03/13 2217

## 2014-01-11 ENCOUNTER — Emergency Department (HOSPITAL_COMMUNITY): Payer: No Typology Code available for payment source

## 2014-01-11 ENCOUNTER — Encounter (HOSPITAL_COMMUNITY): Payer: Self-pay | Admitting: Emergency Medicine

## 2014-01-11 ENCOUNTER — Emergency Department (HOSPITAL_COMMUNITY)
Admission: EM | Admit: 2014-01-11 | Discharge: 2014-01-12 | Disposition: A | Discharge status: Home / Self Care | Payer: No Typology Code available for payment source | Attending: Emergency Medicine | Admitting: Emergency Medicine

## 2014-01-11 DIAGNOSIS — Z8542 Personal history of malignant neoplasm of other parts of uterus: Secondary | ICD-10-CM | POA: Insufficient documentation

## 2014-01-11 DIAGNOSIS — Z87442 Personal history of urinary calculi: Secondary | ICD-10-CM | POA: Insufficient documentation

## 2014-01-11 DIAGNOSIS — J069 Acute upper respiratory infection, unspecified: Secondary | ICD-10-CM

## 2014-01-11 DIAGNOSIS — Z8719 Personal history of other diseases of the digestive system: Secondary | ICD-10-CM | POA: Insufficient documentation

## 2014-01-11 DIAGNOSIS — R079 Chest pain, unspecified: Secondary | ICD-10-CM | POA: Insufficient documentation

## 2014-01-11 DIAGNOSIS — Z8639 Personal history of other endocrine, nutritional and metabolic disease: Secondary | ICD-10-CM | POA: Insufficient documentation

## 2014-01-11 DIAGNOSIS — R0789 Other chest pain: Secondary | ICD-10-CM | POA: Insufficient documentation

## 2014-01-11 DIAGNOSIS — Z79899 Other long term (current) drug therapy: Secondary | ICD-10-CM | POA: Insufficient documentation

## 2014-01-11 DIAGNOSIS — R071 Chest pain on breathing: Secondary | ICD-10-CM

## 2014-01-11 DIAGNOSIS — Z87891 Personal history of nicotine dependence: Secondary | ICD-10-CM | POA: Insufficient documentation

## 2014-01-11 DIAGNOSIS — Z87440 Personal history of urinary (tract) infections: Secondary | ICD-10-CM | POA: Insufficient documentation

## 2014-01-11 DIAGNOSIS — Z862 Personal history of diseases of the blood and blood-forming organs and certain disorders involving the immune mechanism: Secondary | ICD-10-CM | POA: Insufficient documentation

## 2014-01-11 DIAGNOSIS — B9789 Other viral agents as the cause of diseases classified elsewhere: Secondary | ICD-10-CM

## 2014-01-11 DIAGNOSIS — R112 Nausea with vomiting, unspecified: Secondary | ICD-10-CM

## 2014-01-11 LAB — HEPATIC FUNCTION PANEL
ALBUMIN: 4.2 g/dL (ref 3.5–5.2)
ALT: 10 U/L (ref 0–35)
AST: 15 U/L (ref 0–37)
Alkaline Phosphatase: 85 U/L (ref 39–117)
TOTAL PROTEIN: 7.9 g/dL (ref 6.0–8.3)
Total Bilirubin: 0.4 mg/dL (ref 0.3–1.2)

## 2014-01-11 LAB — CBC
HEMATOCRIT: 43.2 % (ref 36.0–46.0)
Hemoglobin: 14.9 g/dL (ref 12.0–15.0)
MCH: 32.8 pg (ref 26.0–34.0)
MCHC: 34.5 g/dL (ref 30.0–36.0)
MCV: 95.2 fL (ref 78.0–100.0)
PLATELETS: 292 10*3/uL (ref 150–400)
RBC: 4.54 MIL/uL (ref 3.87–5.11)
RDW: 12.9 % (ref 11.5–15.5)
WBC: 9.2 10*3/uL (ref 4.0–10.5)

## 2014-01-11 LAB — BASIC METABOLIC PANEL
ANION GAP: 15 (ref 5–15)
BUN: 15 mg/dL (ref 6–23)
CALCIUM: 10.1 mg/dL (ref 8.4–10.5)
CO2: 25 mEq/L (ref 19–32)
CREATININE: 0.93 mg/dL (ref 0.50–1.10)
Chloride: 98 mEq/L (ref 96–112)
GFR calc Af Amer: 79 mL/min — ABNORMAL LOW (ref >90)
GFR calc non Af Amer: 68 mL/min — ABNORMAL LOW (ref >90)
GLUCOSE: 95 mg/dL (ref 70–99)
Potassium: 4.4 mEq/L (ref 3.7–5.3)
Sodium: 138 mEq/L (ref 137–147)

## 2014-01-11 LAB — I-STAT CG4 LACTIC ACID, ED: LACTIC ACID, VENOUS: 1.08 mmol/L (ref 0.5–2.2)

## 2014-01-11 LAB — I-STAT TROPONIN, ED: Troponin i, poc: 0.01 ng/mL (ref 0.00–0.08)

## 2014-01-11 LAB — LIPASE, BLOOD: LIPASE: 38 U/L (ref 11–59)

## 2014-01-11 MED ORDER — PREDNISONE 20 MG PO TABS
60.0000 mg | ORAL_TABLET | Freq: Once | ORAL | Status: AC
  Administered 2014-01-11: 60 mg via ORAL
  Filled 2014-01-11: qty 3

## 2014-01-11 MED ORDER — ONDANSETRON 4 MG PO TBDP
8.0000 mg | ORAL_TABLET | Freq: Once | ORAL | Status: AC
  Administered 2014-01-11: 8 mg via ORAL
  Filled 2014-01-11: qty 2

## 2014-01-11 MED ORDER — ALBUTEROL SULFATE (2.5 MG/3ML) 0.083% IN NEBU
2.5000 mg | INHALATION_SOLUTION | Freq: Once | RESPIRATORY_TRACT | Status: AC
  Administered 2014-01-11: 2.5 mg via RESPIRATORY_TRACT
  Filled 2014-01-11: qty 3

## 2014-01-11 MED ORDER — IPRATROPIUM BROMIDE 0.02 % IN SOLN
0.5000 mg | Freq: Once | RESPIRATORY_TRACT | Status: AC
  Administered 2014-01-11: 0.5 mg via RESPIRATORY_TRACT
  Filled 2014-01-11: qty 2.5

## 2014-01-11 MED ORDER — OXYCODONE-ACETAMINOPHEN 5-325 MG PO TABS
2.0000 | ORAL_TABLET | Freq: Once | ORAL | Status: AC
  Administered 2014-01-11: 2 via ORAL
  Filled 2014-01-11: qty 2

## 2014-01-11 NOTE — ED Provider Notes (Signed)
CSN: [id number]     Arrival date & time 01/11/14  1800 History   First MD Initiated Contact with Patient 01/11/14 2237     Chief Complaint  Patient presents with  . Chest Pain  . Cough    (Consider location/radiation/quality/duration/timing/severity/associated sxs/prior Treatment) HPI Comments: Patient is a 53 year old female who endorses a hx of HTN, but is not on any medical management for this. She presents to the emergency department for cough and upper respiratory symptoms x5 days. Symptoms specifically include nasal congestion, rhinorrhea, and postnasal drip. She states that she had a fever 2 days ago which resolved and she has also been feeling nauseous. She has vomited sporadically as a result, but has been able to keep food and fluids down intermittently. Her nausea has decreased her PO intake. Patient further endorses constant, sternal, "pulling" chest pain, onset 3 days ago. Chest pain is worse with coughing and when palpating her anterior chest wall. Patient denies taking any medications to attempt to relieve symptoms. She denies any sick contacts. Patient denies hemoptysis, shortness of breath, hematemesis, melena, hematochezia, urinary symptoms. She denies a hx of ACS, DVT, and PE.  Patient is a 53 y.o. female presenting with chest pain and cough. The history is provided by the patient. No language interpreter was used.  Chest Pain Associated symptoms: cough, fever (2 days ago, resolved), nausea and vomiting   Associated symptoms: no dysphagia, no numbness and no weakness   Cough Associated symptoms: chest pain, fever (2 days ago, resolved) and rhinorrhea   Associated symptoms: no sore throat     Past Medical History  Diagnosis Date  . SIRS (systemic inflammatory response syndrome)   . Hypokalemia   . Tobacco abuse   . Cocaine addiction   . Cancer     uterine  . Nephrolithiasis   . Pyelonephritis   . UTI (urinary tract infection) 06/2013   Past Surgical History    Procedure Laterality Date  . Renal artery stent      stones  . Tubal ligation    . Vaginal hysterectomy     History reviewed. No pertinent family history. History  Substance Use Topics  . Smoking status: Former Smoker    Types: Cigarettes    Quit date: 06/21/2012  . Smokeless tobacco: Never Used  . Alcohol Use: Yes     Comment: occasionally   OB History   Grav Para Term Preterm Abortions TAB SAB Ect Mult Living                  Review of Systems  Constitutional: Positive for fever (2 days ago, resolved).  HENT: Positive for congestion, postnasal drip and rhinorrhea. Negative for sore throat and trouble swallowing.   Respiratory: Positive for cough and chest tightness.        +chest congestion  Cardiovascular: Positive for chest pain.  Gastrointestinal: Positive for nausea and vomiting.  Neurological: Negative for weakness and numbness.  All other systems reviewed and are negative.   Allergies  Darvocet and Ibuprofen  Home Medications   Prior to Admission medications   Medication Sig Start Date End Date Taking? Authorizing Provider  esomeprazole (NEXIUM) 20 MG capsule Take 40 mg by mouth every evening.   Yes Historical Provider, MD  guaifenesin (ROBITUSSIN) 100 MG/5ML syrup Take 200 mg by mouth 3 (three) times daily as needed for cough.   Yes Historical Provider, MD  Phenylephrine-Pheniramine-DM [facility] COLD & COUGH PO) Take 2 capsules by mouth every 6 (six) hours as needed (  cold symptoms).   Yes Historical Provider, MD   BP 112/76  Pulse 88  Temp(Src) 98 F (36.7 C) (Oral)  Resp 18  SpO2 93%  Physical Exam  Nursing note and vitals reviewed. Constitutional: She is oriented to person, place, and time. She appears well-developed and well-nourished. No distress.  Nontoxic/nonseptic appearing  HENT:  Head: Normocephalic and atraumatic.  Eyes: Conjunctivae and EOM are normal. No scleral icterus.  Neck: Normal range of motion.  No JVD  Cardiovascular: Normal  rate, regular rhythm and intact distal pulses.   Pulmonary/Chest: Effort normal. No respiratory distress. She has wheezes. She has no rales. She exhibits tenderness.    Diffuse expiratory wheezing. No tachypnea or dyspnea. No accessory muscle use. Dry, nonproductive cough appreciated at bedside.  Abdominal: Soft. She exhibits no distension. There is no tenderness. There is no rebound.  Soft, nontender. No peritoneal signs.  Musculoskeletal: Normal range of motion.  Neurological: She is alert and oriented to person, place, and time. She exhibits normal muscle tone. Coordination normal.  GCS 15. Speech is goal oriented; patient speaking in full sentences. Patient moves extremities without ataxia.  Skin: Skin is warm and dry. No rash noted. She is not diaphoretic. No erythema. No pallor.  Psychiatric: She has a normal mood and affect. Her behavior is normal.    ED Course  Procedures (including critical care time) Labs Review Labs Reviewed  BASIC METABOLIC PANEL - Abnormal; Notable for the following:    GFR calc non Af Amer 68 (*)    GFR calc Af Amer 79 (*)    All other components within normal limits  CBC  LIPASE, BLOOD  HEPATIC FUNCTION PANEL  I-STAT TROPOININ, ED  I-STAT CG4 LACTIC ACID, ED    Imaging Review Dg Chest 2 View  01/11/2014   CLINICAL DATA:  Chest pain radiating to the back for the past 2 days. Cough.  EXAM: CHEST  2 VIEW  COMPARISON:  Chest x-ray 08/15/2011.  FINDINGS: Lung volumes are normal. No consolidative airspace disease. No pleural effusions. No pneumothorax. No pulmonary nodule or mass noted. Pulmonary vasculature and the cardiomediastinal silhouette are within normal limits.  IMPRESSION: No radiographic evidence of acute cardiopulmonary disease.   Electronically Signed   By: Vinnie Langton M.D.   On: 01/11/2014 19:18     EKG Interpretation   Date/Time:  Monday January 11 2014 18:11:46 EDT Ventricular Rate:  90 PR Interval:  154 QRS Duration: 74 QT  Interval:  346 QTC Calculation: 423 R Axis:   94 Text Interpretation:  Sinus rhythm with Premature atrial complexes Right  atrial enlargement Rightward axis Pulmonary disease pattern Abnormal ECG  No significant change since prior a Although rate has increased Confirmed  by Debby Freiberg [phone]) on 01/11/2014 11:04:40 PM      MDM   Final diagnoses:  Viral URI with cough  Costochondral chest pain  Nausea and vomiting, vomiting of unspecified type    53 year old female presents to the emergency department for upper respiratory symptoms with cough. Patient developed chest pain 2 days after upper respiratory symptoms. Chest pain is worse with coughing and reproducible on palpation to the chest wall. Patient has no cardiac history and has a reassuring cardiac workup today. Low suspicion for ACS given associated symptoms/preceding URI. Heart score 2-3, dependent on suspicion of history, c/w low risk of MACE.  Patient exhibited diffuse wheezing on physical exam. This improved over ED course with DuoNeb x2 as well as oral steroids. Patient also given Percocet for  pain control and cough. Tessalon given for further cough management. Patient states that she feels improved after this regimen. She has been able to tolerate fluids in the emergency department without emesis. She ambulates without hypoxia.  Symptoms consistent with viral illness. Patient will be managed as an outpatient with albuterol inhaler, prednisone, Zofran, Percocet, and Tessalon. Have advised patient to follow up with her primary care provider to ensure resolution of symptoms. Return precautions discussed and provided. Patient agreeable to plan with no unaddressed concerns. Patient discharged in good condition; VSS.    Antonietta Breach, [place] 01/13/14 [phone]

## 2014-01-11 NOTE — ED Notes (Signed)
Apologized to pt for wait time. Pt reports continued chest pain. Pt in NAD.

## 2014-01-11 NOTE — ED Notes (Signed)
Pt c/o generalized CP and cough with URI sx x 3 days

## 2014-01-11 NOTE — ED Notes (Signed)
Pt here for eval of productive cough yellow sputum X 4 days, reports pain to chest when breathing, pt sounds congested.

## 2014-01-12 MED ORDER — ALBUTEROL SULFATE (2.5 MG/3ML) 0.083% IN NEBU
5.0000 mg | INHALATION_SOLUTION | Freq: Once | RESPIRATORY_TRACT | Status: AC
  Administered 2014-01-12: 5 mg via RESPIRATORY_TRACT
  Filled 2014-01-12: qty 6

## 2014-01-12 MED ORDER — BENZONATATE 100 MG PO CAPS
100.0000 mg | ORAL_CAPSULE | Freq: Once | ORAL | Status: AC
  Administered 2014-01-12: 100 mg via ORAL
  Filled 2014-01-12: qty 1

## 2014-01-12 MED ORDER — ALBUTEROL SULFATE HFA 108 (90 BASE) MCG/ACT IN AERS
2.0000 | INHALATION_SPRAY | Freq: Once | RESPIRATORY_TRACT | Status: AC
  Administered 2014-01-12: 2 via RESPIRATORY_TRACT
  Filled 2014-01-12: qty 6.7

## 2014-01-12 MED ORDER — BENZONATATE 100 MG PO CAPS: 100.0000 mg | ORAL_CAPSULE | Freq: Three times a day (TID) | ORAL | Status: DC

## 2014-01-12 MED ORDER — IPRATROPIUM BROMIDE 0.02 % IN SOLN
0.5000 mg | Freq: Once | RESPIRATORY_TRACT | Status: AC
  Administered 2014-01-12: 0.5 mg via RESPIRATORY_TRACT
  Filled 2014-01-12: qty 2.5

## 2014-01-12 MED ORDER — OXYCODONE-ACETAMINOPHEN 5-325 MG PO TABS: 1.0000 | ORAL_TABLET | Freq: Four times a day (QID) | ORAL | Status: DC | PRN

## 2014-01-12 MED ORDER — PREDNISONE 20 MG PO TABS: 40.0000 mg | ORAL_TABLET | Freq: Every day | ORAL | Status: DC

## 2014-01-12 MED ORDER — ONDANSETRON HCL 4 MG PO TABS: 4.0000 mg | ORAL_TABLET | Freq: Four times a day (QID) | ORAL | Status: DC

## 2014-01-12 NOTE — Discharge Instructions (Signed)
Recommend you followup with your primary doctor to ensure resolution of symptoms. For cough, recommend you take Tessalon. You may take Percocet as needed for severe pain. Use albuterol inhaler, 2 puffs every 4 hours, as needed for shortness of breath and cough. Take prednisone as prescribed to ensure that your airways remain open as you had some wheezing on your initial exam today. You may also try over-the-counter cough and cold medicines such as Mucinex D for congestion and cough. Return to the emergency department as needed if symptoms worsen.  Upper Respiratory Infection, Adult An upper respiratory infection (URI) is also known as the common cold. It is often caused by a type of germ (virus). Colds are easily spread (contagious). You can pass it to others by kissing, coughing, sneezing, or drinking out of the same glass. Usually, you get better in 1 or 2 weeks.  HOME CARE   Only take medicine as told by your doctor.  Use a warm mist humidifier or breathe in steam from a hot shower.  Drink enough water and fluids to keep your pee (urine) clear or pale yellow.  Get plenty of rest.  Return to work when your temperature is back to normal or as told by your doctor. You may use a face mask and wash your hands to stop your cold from spreading. GET HELP RIGHT AWAY IF:   After the first few days, you feel you are getting worse.  You have questions about your medicine.  You have chills, shortness of breath, or brown or red spit (mucus).  You have yellow or brown snot (nasal discharge) or pain in the face, especially when you bend forward.  You have a fever, puffy (swollen) neck, pain when you swallow, or white spots in the back of your throat.  You have a bad headache, ear pain, sinus pain, or chest pain.  You have a high-pitched whistling sound when you breathe in and out (wheezing).  You have a lasting cough or cough up blood.  You have sore muscles or a stiff neck. MAKE SURE YOU:    Understand these instructions.  Will watch your condition.  Will get help right away if you are not doing well or get worse. Document Released: 09/26/2007 Document Revised: 07/02/2011 Document Reviewed: 07/15/2013 [facility] Patient Information 2015 [place], [place]. This information is not intended to replace advice given to you by your health care provider. Make sure you discuss any questions you have with your health care provider. Cough, Adult  A cough is a reflex that helps clear your throat and airways. It can help heal the body or may be a reaction to an irritated airway. A cough may only last 2 or 3 weeks (acute) or may last more than 8 weeks (chronic).  CAUSES Acute cough:  Viral or bacterial infections. Chronic cough:  Infections.  Allergies.  Asthma.  Post-nasal drip.  Smoking.  Heartburn or acid reflux.  Some medicines.  Chronic lung problems (COPD).  Cancer. SYMPTOMS   Cough.  Fever.  Chest pain.  Increased breathing rate.  High-pitched whistling sound when breathing (wheezing).  Colored mucus that you cough up (sputum). TREATMENT   A bacterial cough may be treated with antibiotic medicine.  A viral cough must run its course and will not respond to antibiotics.  Your caregiver may recommend other treatments if you have a chronic cough. HOME CARE INSTRUCTIONS   Only take over-the-counter or prescription medicines for pain, discomfort, or fever as directed by your caregiver. Use  cough suppressants only as directed by your caregiver.  Use a cold steam vaporizer or humidifier in your bedroom or home to help loosen secretions.  Sleep in a semi-upright position if your cough is worse at night.  Rest as needed.  Stop smoking if you smoke. SEEK IMMEDIATE MEDICAL CARE IF:   You have pus in your sputum.  Your cough starts to worsen.  You cannot control your cough with suppressants and are losing sleep.  You begin coughing up blood.  You  have difficulty breathing.  You develop pain which is getting worse or is uncontrolled with medicine.  You have a fever. MAKE SURE YOU:   Understand these instructions.  Will watch your condition.  Will get help right away if you are not doing well or get worse. Document Released: 10/06/2010 Document Revised: 07/02/2011 Document Reviewed: 10/06/2010 [facility] Patient Information 2015 [place], [place]. This information is not intended to replace advice given to you by your health care provider. Make sure you discuss any questions you have with your health care provider. Costochondritis Costochondritis, sometimes called Tietze syndrome, is a swelling and irritation (inflammation) of the tissue (cartilage) that connects your ribs with your breastbone (sternum). It causes pain in the chest and rib area. Costochondritis usually goes away on its own over time. It can take up to 6 weeks or longer to get better, especially if you are unable to limit your activities. CAUSES  Some cases of costochondritis have no known cause. Possible causes include:  Injury (trauma).  Exercise or activity such as lifting.  Severe coughing. SIGNS AND SYMPTOMS  Pain and tenderness in the chest and rib area.  Pain that gets worse when coughing or taking deep breaths.  Pain that gets worse with specific movements. DIAGNOSIS  Your health care provider will do a physical exam and ask about your symptoms. Chest X-rays or other tests may be done to rule out other problems. TREATMENT  Costochondritis usually goes away on its own over time. Your health care provider may prescribe medicine to help relieve pain. HOME CARE INSTRUCTIONS   Avoid exhausting physical activity. Try not to strain your ribs during normal activity. This would include any activities using chest, abdominal, and side muscles, especially if heavy weights are used.  Apply ice to the affected area for the first 2 days after the pain begins.  Put  ice in a plastic bag.  Place a towel between your skin and the bag.  Leave the ice on for 20 minutes, 2-3 times a day.  Only take over-the-counter or prescription medicines as directed by your health care provider. SEEK MEDICAL CARE IF:  You have redness or swelling at the rib joints. These are signs of infection.  Your pain does not go away despite rest or medicine. SEEK IMMEDIATE MEDICAL CARE IF:   Your pain increases or you are very uncomfortable.  You have shortness of breath or difficulty breathing.  You cough up blood.  You have worse chest pains, sweating, or vomiting.  You have a fever or persistent symptoms for more than 2-3 days.  You have a fever and your symptoms suddenly get worse. MAKE SURE YOU:   Understand these instructions.  Will watch your condition.  Will get help right away if you are not doing well or get worse. Document Released: 01/17/2005 Document Revised: 01/28/2013 Document Reviewed: 11/11/2012 [facility] Patient Information 2015 [place], [place]. This information is not intended to replace advice given to you by your health care provider. Make  sure you discuss any questions you have with your health care provider.

## 2014-01-12 NOTE — ED Notes (Signed)
Pt given [place] sandwich, tolerated well.

## 2014-01-12 NOTE — ED Notes (Signed)
Pt's 02 sats maintained at 94% or greater with ambulation, pt's HR 101-111. PA informed.

## 2014-01-16 ENCOUNTER — Emergency Department (HOSPITAL_COMMUNITY)
Admission: EM | Admit: 2014-01-16 | Discharge: 2014-01-16 | Disposition: A | Discharge status: Home / Self Care | Payer: No Typology Code available for payment source | Attending: Emergency Medicine | Admitting: Emergency Medicine

## 2014-01-16 ENCOUNTER — Encounter (HOSPITAL_COMMUNITY): Payer: Self-pay | Admitting: Emergency Medicine

## 2014-01-16 ENCOUNTER — Emergency Department (HOSPITAL_COMMUNITY): Payer: No Typology Code available for payment source

## 2014-01-16 DIAGNOSIS — Z87442 Personal history of urinary calculi: Secondary | ICD-10-CM | POA: Insufficient documentation

## 2014-01-16 DIAGNOSIS — R05 Cough: Secondary | ICD-10-CM | POA: Insufficient documentation

## 2014-01-16 DIAGNOSIS — Z87440 Personal history of urinary (tract) infections: Secondary | ICD-10-CM | POA: Insufficient documentation

## 2014-01-16 DIAGNOSIS — Z8639 Personal history of other endocrine, nutritional and metabolic disease: Secondary | ICD-10-CM | POA: Insufficient documentation

## 2014-01-16 DIAGNOSIS — Z862 Personal history of diseases of the blood and blood-forming organs and certain disorders involving the immune mechanism: Secondary | ICD-10-CM | POA: Insufficient documentation

## 2014-01-16 DIAGNOSIS — Z8541 Personal history of malignant neoplasm of cervix uteri: Secondary | ICD-10-CM | POA: Insufficient documentation

## 2014-01-16 DIAGNOSIS — J4489 Other specified chronic obstructive pulmonary disease: Secondary | ICD-10-CM | POA: Insufficient documentation

## 2014-01-16 DIAGNOSIS — Z79899 Other long term (current) drug therapy: Secondary | ICD-10-CM | POA: Insufficient documentation

## 2014-01-16 DIAGNOSIS — Z87891 Personal history of nicotine dependence: Secondary | ICD-10-CM | POA: Insufficient documentation

## 2014-01-16 DIAGNOSIS — R059 Cough, unspecified: Secondary | ICD-10-CM | POA: Insufficient documentation

## 2014-01-16 DIAGNOSIS — J209 Acute bronchitis, unspecified: Secondary | ICD-10-CM

## 2014-01-16 DIAGNOSIS — M549 Dorsalgia, unspecified: Secondary | ICD-10-CM | POA: Insufficient documentation

## 2014-01-16 DIAGNOSIS — J449 Chronic obstructive pulmonary disease, unspecified: Secondary | ICD-10-CM | POA: Insufficient documentation

## 2014-01-16 DIAGNOSIS — Z8619 Personal history of other infectious and parasitic diseases: Secondary | ICD-10-CM | POA: Insufficient documentation

## 2014-01-16 MED ORDER — HYDROCODONE-ACETAMINOPHEN 5-325 MG PO TABS: 1.0000 | ORAL_TABLET | Freq: Four times a day (QID) | ORAL | Status: DC | PRN

## 2014-01-16 MED ORDER — PREDNISONE 20 MG PO TABS
60.0000 mg | ORAL_TABLET | Freq: Once | ORAL | Status: AC
  Administered 2014-01-16: 60 mg via ORAL
  Filled 2014-01-16: qty 3

## 2014-01-16 MED ORDER — AEROCHAMBER PLUS FLO-VU LARGE MISC
1.0000 | Freq: Once | Status: AC
  Administered 2014-01-16: 1
  Filled 2014-01-16 (×3): qty 1

## 2014-01-16 MED ORDER — IPRATROPIUM-ALBUTEROL 0.5-2.5 (3) MG/3ML IN SOLN
3.0000 mL | Freq: Once | RESPIRATORY_TRACT | Status: AC
  Administered 2014-01-16: 3 mL via RESPIRATORY_TRACT
  Filled 2014-01-16: qty 3

## 2014-01-16 MED ORDER — PREDNISONE 10 MG PO TABS: 20.0000 mg | ORAL_TABLET | Freq: Every day | ORAL | Status: DC

## 2014-01-16 MED ORDER — ALBUTEROL SULFATE HFA 108 (90 BASE) MCG/ACT IN AERS
2.0000 | INHALATION_SPRAY | RESPIRATORY_TRACT | Status: DC | PRN
  Administered 2014-01-16: 2 via RESPIRATORY_TRACT
  Filled 2014-01-16: qty 6.7

## 2014-01-16 MED ORDER — HYDROCODONE-ACETAMINOPHEN 5-325 MG PO TABS
1.0000 | ORAL_TABLET | Freq: Once | ORAL | Status: AC
  Administered 2014-01-16: 1 via ORAL
  Filled 2014-01-16: qty 1

## 2014-01-16 MED ORDER — ONDANSETRON 4 MG PO TBDP: 4.0000 mg | ORAL_TABLET | Freq: Three times a day (TID) | ORAL | Status: DC | PRN

## 2014-01-16 MED ORDER — ONDANSETRON 4 MG PO TBDP
4.0000 mg | ORAL_TABLET | Freq: Once | ORAL | Status: AC
  Administered 2014-01-16: 4 mg via ORAL
  Filled 2014-01-16: qty 1

## 2014-01-16 NOTE — Discharge Instructions (Signed)
Use the inhaler as follows  2 puffs with the aero chamber every 4-6 hours while awake for the next 2 days than as needed  Please take the Prednisone until all tablets have been taken    Emergency Department Resource Guide 1) Find a Doctor and Pay Out of Pocket Although you won't have to find out who is covered by your insurance plan, it is a good idea to ask around and get recommendations. You will then need to call the office and see if the doctor you have chosen will accept you as a new patient and what types of options they offer for patients who are self-pay. Some doctors offer discounts or will set up payment plans for their patients who do not have insurance, but you will need to ask so you aren't surprised when you get to your appointment.  2) Contact Your Local Health Department Not all health departments have doctors that can see patients for sick visits, but many do, so it is worth a call to see if yours does. If you don't know where your local health department is, you can check in your phone book. The CDC also has a tool to help you locate your state's health department, and many state websites also have listings of all of their local health departments.  3) Find a [facility] If your illness is not likely to be very severe or complicated, you may want to try a walk in clinic. These are popping up all over the country in pharmacies, drugstores, and shopping centers. They're usually staffed by nurse practitioners or physician assistants that have been trained to treat common illnesses and complaints. They're usually fairly quick and inexpensive. However, if you have serious medical issues or chronic medical problems, these are probably not your best option.  No Primary Care Doctor: - Call Health Connect at  [phone] - they can help you locate a primary care doctor that  accepts your insurance, provides certain services, etc. - Physician Referral Service- [phone]  Chronic Pain  Problems: Organization         Address  Phone   Notes  [facility]  [phone] Patients need to be referred by their primary care doctor.   Medication Assistance: Organization         Address  Phone   Notes  [facility] Medication [facility] [address] [place], [place] [address] [phone] --Must be a resident of [facility] -- Must have NO insurance coverage whatsoever (no Medicaid/ Medicare, etc.) -- The pt. MUST have a primary care doctor that directs their care regularly and follows them in the community   MedAssist  [phone]   [employer]  [phone]    Agencies that provide inexpensive medical care: Organization         Address  Phone   Notes  [place]  [phone]   [facility]    [phone]   [facility] [place], [place] [address] [phone]   [place] [address], [place] [phone]   Planned Parenthood    [phone]   [facility]    [phone]   [place] and [address], [place] Phone:  [phone], Fax:  [phone] Hours of Operation:  9 am - 6 pm, M-F.  Also accepts Medicaid/Medicare and self-pay.  [facility]  [place] for [place] [address], [address], [place] Phone: [phone], Fax: [phone]. Hours of Operation:  8:30 am - 5:30 pm, M-F.  Also accepts Medicaid and self-pay.  [facility] [address] [place] Phone: [phone]   [place], [place], [place] [phone], Ext. [phone] Mondays & Thursdays: 7-9 AM.  First 15 patients are seen on a first come, first serve basis.    [place] Providers:  Organization         Address  Phone   Notes  [facility] [address], [place] [phone] Also  accepts self-pay patients.  [facility] [address], [place]  [phone]   [place], [address], [place] [phone]   [facility] [address] [place] [phone]   Lucianne Lei [address], [place]   [phone] Only accepts [place] Access [place] patients after they have their name applied to their card.   Self-Pay (no insurance) in [facility]:  Organization         Address  Phone   Notes  Sickle Cell Patients, [facility] Internal Medicine [place] [phone]   [facility] [place] [phone]   [facility] [place]  [place], [place], [place] [phone]   [facility]/Dr. Osei-Bonsu  [address] [place] or [address], [place] [phone] Phone number for both [place] and [place] locations is the same.  Urgent Medical and [facility] [address] [place] [phone]   [facility] [address], [place] or [address] [phone]   [facility] [address], [place] [phone], phone; [phone], fax Sees patients 1st and 3rd Saturday of every month.  Must not qualify for public or private insurance (i.e. Medicaid, Medicare, [place] Health Choice, Veterans' Benefits)  Household income should be no more than 200% of the poverty level The clinic cannot treat you if you are pregnant or think you are pregnant  Sexually transmitted diseases are not treated at the clinic.    Dental Care: Organization         Address  Phone  Notes  [facility] Department of [facility] [place] [phone] Accepts children up to age 72 who are enrolled in [place] or [place]; pregnant  women with a Medicaid card; and children who have applied for Medicaid or [place] Health Choice, but were declined, whose parents can pay a reduced fee at time of service.  [facility] Department of [facility]  [address], [place] [phone] Accepts children up to age 22 who are enrolled in [place] or [place]; pregnant women with a Medicaid card; and children who have applied for Medicaid or [place] Health Choice, but were declined, whose parents can pay a reduced fee at time of service.  [facility] Access PROGRAM  [place] [phone] Patients are seen by appointment only. Walk-ins are not accepted. Home will see patients 90 years of age and older. Monday - Tuesday (8am-5pm) Most Wednesdays (8:30-5pm) $30 per visit,  cash only  [employer] Adult [employer] PROGRAM  [address], [place] [phone] Patients are seen by appointment only. Walk-ins are not accepted. [place] will see patients 1 years of age and older. One Wednesday Evening (Monthly: Volunteer Based).  $30 per visit, cash only  [place]  [phone] for adults; Children under age 73, call Graduate Pediatric Dentistry at [phone]. Children aged 60-14, please call [phone] to request a pediatric application.  Dental services are provided in all areas of dental care including fillings, crowns and bridges, complete and partial dentures, implants, gum treatment, root canals, and extractions. Preventive care is also provided. Treatment is provided to both adults and children. Patients are selected via a lottery and there is often a waiting list.   [facility] [address] [place]  [phone] [email or web address]   [facility] [address] [place], [place] [phone], Ext. [phone] Second and Fourth Thursday of each month, opens at 6:30 AM; Clinic ends at 9 AM.  Patients are  seen on a first-come first-served basis, and a limited number are seen during each clinic.   [facility]  [address] Hillard Danker [place], [place] [phone]   Eligibility Requirements You must have lived in [place], [place], or [place] counties for at least the last three months.   You cannot be eligible for state or federal sponsored [employer], including [employer], [place], or [employer].   You generally cannot be eligible for healthcare insurance through your employer.    How to apply: Eligibility screenings are held every Tuesday and Wednesday afternoon from 1:00 pm until 4:00 pm. You do not need an appointment for the interview!  [facility] [address], [place], [place]   [place]  [place] Department  [place]  [phone]    Behavioral Health Resources in the Community: Intensive Outpatient Programs Organization         Address  Phone  Notes  [place]. [address], [place], [place] [phone]   [facility] Outpatient [address] [place], [place]   ADS: Alcohol & Drug Svcs [address] [place], [place]   [place] [address]  [place], [place] or [phone]   Substance Abuse Resources Organization         Address  Phone  Notes  Alcohol and Drug Services  [phone]   [place]  [phone]   [facility] [place]   Chinita Pester  [phone]   Residential & Outpatient Substance Abuse Program  [phone]   Psychological Services Organization         Address  Phone  Notes  [facility] [place]  [place]  [phone]   [place] [address], [place] or [phone]    Mobile Crisis  Teams Organization         Address  Phone  Notes  [facility]rapeutic Alternatives, Mobile Crisis Care Unit  [phone]   Assertive Psychotherapeutic Services  [address] [place], [place]   [place] [address], [place] [phone]    Self-Help/Support Groups Organization         Address  Phone  Notes  Mental Health Assoc. of [place] - variety of support groups  [place] Call for more information  Narcotics Anonymous (NA), Caring Services [address], [employer] [place]  2 meetings at this location   Special educational needs teacher         Address  Phone  Notes  [facility] Residential Treatment [place],    [place]  [phone]   [facility]  [address] [place] [address], [place], [place]   [place], [place] [phone] Admissions: 8am-3pm M-F  Incentives Substance [place] [address],    [place], [place] [phone]   [facility] [address] [place], [place], [place]   [facility] [address]  [place], [place]   Insight Programs - Intensive Outpatient [address], [place], [place]   [facility] ([place].) [address]  [place], [place] [phone] or [phone]   [facility]idential Treatment Services (RTS) [address] [place], [place] Accepts Medicaid  [facility] [place] [address]  [place] [phone] Substance Abuse/Addiction Treatment   [facility] Organization         Address  Phone  Notes  [facility]  [phone]   Domenic Schwab, PhD [address] Arlis Porta [place], [place]   [phone] or [phone]   [place], [place] [phone]   [facility] [address] [place], [place] [phone]  Insurance/Medicaid/sponsorship through [facility] and Families [address] [place]                                    [place], [place] [phone] [place] [address], [place] [phone]    Dr. Adele Schilder  [phone]   [facility] Dept. 1) [address] [place] 2) [place] 3)  [place] [address], [place] [phone]  [phone]   [place] [phone] or [phone] (After Hours)

## 2014-01-16 NOTE — ED Notes (Signed)
Pt c/o productive cough with chest pain and back pain. Symptoms x 1 1/2 weeks. Seen at [facility] week for same. Pt has taken prednisone and percocet for symptoms, but states she is not getting any better. Pt has no acute distress. Skin warm and dry. Denies nausea.

## 2014-01-16 NOTE — ED Provider Notes (Signed)
CSN: [id number]     Arrival date & time 01/16/14  2122 History  This chart was scribed for non-physician practitioner, Junius Creamer, NP, working with Virgel Manifold, MD, by Jeanell Sparrow, ED Scribe. This patient was seen in room WTR5/WTR5 and the patient's care was started at 9:45 PM.   Chief Complaint  Patient presents with  . Cough  . Back Pain   The history is provided by the patient. No language interpreter was used.   HPI Comments: Kelli Jenkins is a 53 y.o. female with a hx of Asthma and COPD who presents to the Emergency Department complaining of a persisitent moderate productive cough that started about 1.5 weeks ago. Pt reports [facility] cough. She states that she was taken prednisone and percocet and has not been getting any better. She reports that she has associated chest and back pain. She states that she has a hx of smoking. She denies any nausea.     Past Medical History  Diagnosis Date  . SIRS (systemic inflammatory response syndrome)   . Hypokalemia   . Tobacco abuse   . Cocaine addiction   . Cancer     uterine  . Nephrolithiasis   . Pyelonephritis   . UTI (urinary tract infection) 06/2013   Past Surgical History  Procedure Laterality Date  . Renal artery stent      stones  . Tubal ligation    . Vaginal hysterectomy     No family history on file. History  Substance Use Topics  . Smoking status: Former Smoker    Types: Cigarettes    Quit date: 06/21/2012  . Smokeless tobacco: Never Used  . Alcohol Use: Yes     Comment: occasionally   OB History   Grav Para Term Preterm Abortions TAB SAB Ect Mult Living                 Review of Systems  Respiratory: Positive for cough.   Cardiovascular: Positive for chest pain.  Musculoskeletal: Positive for back pain.  All other systems reviewed and are negative.   Allergies  Darvocet; Ibuprofen; and Tramadol  Home Medications   Prior to Admission medications   Medication Sig Start Date  End Date Taking? Authorizing Provider  esomeprazole (NEXIUM) 20 MG capsule Take 40 mg by mouth every evening.   Yes Historical Provider, MD   BP 119/77  Pulse 85  Temp(Src) 99 F (37.2 C) (Oral)  Resp 16  SpO2 99% Physical Exam  ED Course  Procedures (including critical care time) DIAGNOSTIC STUDIES: Oxygen Saturation is 99% on RA, normal by my interpretation.    COORDINATION OF CARE: 9:49 PM- Pt advised of plan for treatment which includes medication and radiology and pt agrees.  Labs Review Labs Reviewed - No data to display  Imaging Review Dg Chest 2 View  01/16/2014   CLINICAL DATA:  Shortness of breath and chest pain. Cough and congestion for 1-1/2 weeks. History of hypertension and smoking.  EXAM: CHEST  2 VIEW  COMPARISON:  01/11/2014  FINDINGS: Heart size is normal. Lungs are free of focal consolidations and pleural effusions. There is mild perihilar bronchitic change. Visualized osseous structures have a normal appearance.  IMPRESSION: 1. Bronchitic change. 2.  No focal acute pulmonary abnormality.   Electronically Signed   By: Shon Hale M.D.   On: 01/16/2014 22:04     EKG Interpretation None     After neb treatment slight end expiratory wheeze Will  give patient inhaler and spacer, additional course of steroids  MDM   Final diagnoses:  None   I personally performed the services described in this documentation, which was scribed in my presence. The recorded information has been reviewed and is accurate.    Garald Balding, NP 01/16/14 [phone]

## 2014-01-16 NOTE — ED Provider Notes (Signed)
Medical screening examination/treatment/procedure(s) were performed by non-physician practitioner and as supervising physician I was immediately available for consultation/collaboration.   EKG Interpretation   Date/Time:  Monday January 11 2014 18:11:46 EDT Ventricular Rate:  90 PR Interval:  154 QRS Duration: 74 QT Interval:  346 QTC Calculation: 423 R Axis:   94 Text Interpretation:  Sinus rhythm with Premature atrial complexes Right  atrial enlargement Rightward axis Pulmonary disease pattern Abnormal ECG  No significant change since prior a Although rate has increased Confirmed  by Debby Freiberg [id number]) on 01/11/2014 11:04:40 PM        Debby Freiberg, MD 01/16/14 [phone]

## 2014-01-21 NOTE — ED Provider Notes (Signed)
Medical screening examination/treatment/procedure(s) were performed by non-physician practitioner and as supervising physician I was immediately available for consultation/collaboration.   EKG Interpretation None       Virgel Manifold, MD 01/21/14 1347

## 2014-01-25 ENCOUNTER — Ambulatory Visit: Payer: No Typology Code available for payment source | Admitting: Internal Medicine

## 2014-04-02 ENCOUNTER — Emergency Department (HOSPITAL_COMMUNITY)
Admission: EM | Admit: 2014-04-02 | Discharge: 2014-04-02 | Disposition: A | Discharge status: Home / Self Care | Payer: No Typology Code available for payment source | Attending: Emergency Medicine | Admitting: Emergency Medicine

## 2014-04-02 ENCOUNTER — Encounter (HOSPITAL_COMMUNITY): Payer: Self-pay

## 2014-04-02 DIAGNOSIS — Z048 Encounter for examination and observation for other specified reasons: Secondary | ICD-10-CM | POA: Insufficient documentation

## 2014-04-02 DIAGNOSIS — Z87448 Personal history of other diseases of urinary system: Secondary | ICD-10-CM | POA: Insufficient documentation

## 2014-04-02 DIAGNOSIS — Z8542 Personal history of malignant neoplasm of other parts of uterus: Secondary | ICD-10-CM | POA: Insufficient documentation

## 2014-04-02 DIAGNOSIS — R102 Pelvic and perineal pain: Secondary | ICD-10-CM | POA: Insufficient documentation

## 2014-04-02 DIAGNOSIS — Z87891 Personal history of nicotine dependence: Secondary | ICD-10-CM | POA: Insufficient documentation

## 2014-04-02 DIAGNOSIS — Z8639 Personal history of other endocrine, nutritional and metabolic disease: Secondary | ICD-10-CM | POA: Insufficient documentation

## 2014-04-02 DIAGNOSIS — Z79899 Other long term (current) drug therapy: Secondary | ICD-10-CM | POA: Insufficient documentation

## 2014-04-02 DIAGNOSIS — Z87440 Personal history of urinary (tract) infections: Secondary | ICD-10-CM | POA: Insufficient documentation

## 2014-04-02 DIAGNOSIS — Z8619 Personal history of other infectious and parasitic diseases: Secondary | ICD-10-CM | POA: Insufficient documentation

## 2014-04-02 DIAGNOSIS — Z90710 Acquired absence of both cervix and uterus: Secondary | ICD-10-CM | POA: Insufficient documentation

## 2014-04-02 DIAGNOSIS — Z9851 Tubal ligation status: Secondary | ICD-10-CM | POA: Insufficient documentation

## 2014-04-02 DIAGNOSIS — Z711 Person with feared health complaint in whom no diagnosis is made: Secondary | ICD-10-CM

## 2014-04-02 DIAGNOSIS — Z87442 Personal history of urinary calculi: Secondary | ICD-10-CM | POA: Insufficient documentation

## 2014-04-02 LAB — WET PREP, GENITAL
CLUE CELLS WET PREP: NONE SEEN
TRICH WET PREP: NONE SEEN
WBC, Wet Prep HPF POC: NONE SEEN
Yeast Wet Prep HPF POC: NONE SEEN

## 2014-04-02 LAB — URINE MICROSCOPIC-ADD ON

## 2014-04-02 LAB — URINALYSIS, ROUTINE W REFLEX MICROSCOPIC
Bilirubin Urine: NEGATIVE
Glucose, UA: NEGATIVE mg/dL
KETONES UR: NEGATIVE mg/dL
NITRITE: POSITIVE — AB
PROTEIN: NEGATIVE mg/dL
Specific Gravity, Urine: 1.036 — ABNORMAL HIGH (ref 1.005–1.030)
Urobilinogen, UA: 1 mg/dL (ref 0.0–1.0)
pH: 6 (ref 5.0–8.0)

## 2014-04-02 LAB — RPR

## 2014-04-02 LAB — HIV ANTIBODY (ROUTINE TESTING W REFLEX): HIV 1&2 Ab, 4th Generation: NONREACTIVE

## 2014-04-02 NOTE — ED Notes (Signed)
EDPA COURTNEY GAVE VERBAL D/C INSTRUCTIONS AT BS. EDPA WENT TO PRINT D/C PAPERWORK. PT DID NOT WANT TO STAY FOR LAB RESULTS STATING SHE WOULD JUST CALL LATER. UPON RETURNING TO ROOM. PT WAS NOT IN ROOM. Mali QUEEN RN WITNESSED PT LEAVING ED. APPEARED IN NAD. VSS WERE NOT TAKEN.

## 2014-04-02 NOTE — ED Notes (Addendum)
Pt had ? Object stuck in vagina yesterday. Had relations with person not husband. (thinks its a condom yet not sure) wants STD check however not sure she can stay. Denies N/V/D and fever.  ? Black discharge. Pt states she and husband are separated and husband is aware of recent relations.

## 2014-04-02 NOTE — ED Provider Notes (Signed)
CSN: [id number]     Arrival date & time 04/02/14  0935 History   First MD Initiated Contact with Patient 04/02/14 [phone]     Chief Complaint  Patient presents with  . Foreign Body in Vagina   HPI  Patient is a 53 year old female who presents to the emergency room for evaluation of possible foreign body in her vagina. Patient states that last night she had sex with a new partner who "was wearing double condoms". Patient feels that she still has a condom inside her. She tried douching twice and got back black flecks of material. She states that she is having vaginal burning. She denies any vaginal discharge. She states that both she and her husband who are separated at the moment tried to find the foreign body but were unable to find anything. Patient denies any previous history of STDs. She is only had 2 sexual partners in the last 6 months. Patient denies any urinary symptoms. Patient states that she had a partial hysterectomy.   Past Medical History  Diagnosis Date  . SIRS (systemic inflammatory response syndrome)   . Hypokalemia   . Tobacco abuse   . Cocaine addiction   . Cancer     uterine  . Nephrolithiasis   . Pyelonephritis   . UTI (urinary tract infection) 06/2013   Past Surgical History  Procedure Laterality Date  . Renal artery stent      stones  . Tubal ligation    . Vaginal hysterectomy     No family history on file. History  Substance Use Topics  . Smoking status: Former Smoker    Types: Cigarettes    Quit date: 06/21/2012  . Smokeless tobacco: Never Used  . Alcohol Use: Yes     Comment: occasionally   OB History    No data available     Review of Systems  Constitutional: Negative for fever, chills and fatigue.  Respiratory: Negative for chest tightness and shortness of breath.   Cardiovascular: Negative for chest pain and palpitations.  Gastrointestinal: Negative for nausea, vomiting, abdominal pain, diarrhea, constipation and blood in stool.   Genitourinary: Positive for vaginal pain. Negative for frequency, hematuria, decreased urine volume, vaginal bleeding, vaginal discharge, difficulty urinating, genital sores, menstrual problem and pelvic pain.  All other systems reviewed and are negative.     Allergies  Darvocet; Ibuprofen; and Tramadol  Home Medications   Prior to Admission medications   Medication Sig Start Date End Date Taking? Authorizing Provider  esomeprazole (NEXIUM) 20 MG capsule Take 40 mg by mouth daily as needed (heartburn/indigestion).    Yes Historical Provider, MD  HYDROcodone-acetaminophen (NORCO/VICODIN) 5-325 MG per tablet Take 1 tablet by mouth every 6 (six) hours as needed for moderate pain. Patient not taking: Reported on 04/02/2014 01/16/14   Garald Balding, NP  ondansetron (ZOFRAN-ODT) 4 MG disintegrating tablet Take 1 tablet (4 mg total) by mouth every 8 (eight) hours as needed for nausea or vomiting. Patient not taking: Reported on 04/02/2014 01/16/14   Garald Balding, NP  predniSONE (DELTASONE) 10 MG tablet Take 2 tablets (20 mg total) by mouth daily. Patient not taking: Reported on 04/02/2014 01/16/14   Garald Balding, NP   BP 111/59 mmHg  Pulse 90  Temp(Src) 98.8 F (37.1 C) (Oral)  Resp 18  Ht 5\' 8"  (1.727 m)  Wt 205 lb (92.987 kg)  BMI 31.18 kg/m2  SpO2 99% Physical Exam  Constitutional: She is oriented to person, place, and time. She appears  well-developed and well-nourished. No distress.  HENT:  Head: Normocephalic and atraumatic.  Mouth/Throat: Oropharynx is clear and moist. No oropharyngeal exudate.  Eyes: Conjunctivae and EOM are normal. Pupils are equal, round, and reactive to light. No scleral icterus.  Neck: Normal range of motion. Neck supple. No JVD present. No thyromegaly present.  Cardiovascular: Normal rate, regular rhythm, normal heart sounds and intact distal pulses.  Exam reveals no gallop and no friction rub.   No murmur heard. Pulmonary/Chest: Effort normal and breath  sounds normal. No respiratory distress. She has no wheezes. She has no rales. She exhibits no tenderness.  Abdominal: Soft. Bowel sounds are normal. She exhibits no distension and no mass. There is no tenderness. There is no rebound and no guarding.  Genitourinary: No labial fusion. There is no rash, tenderness, lesion or injury on the right labia. There is no rash, tenderness, lesion or injury on the left labia. Right adnexum displays no mass, no tenderness and no fullness. Left adnexum displays no mass, no tenderness and no fullness. No erythema, tenderness or bleeding in the vagina. No foreign body around the vagina. No signs of injury around the vagina. No vaginal discharge found.  Uterus and cervix surgically absent. There is no visible or palpable foreign body on examination. There is minimal to no mucus or vaginal discharge on examination.  Musculoskeletal: Normal range of motion.  Lymphadenopathy:    She has no cervical adenopathy.  Neurological: She is alert and oriented to person, place, and time.  Skin: Skin is warm and dry. She is not diaphoretic.  Psychiatric: She has a normal mood and affect. Her behavior is normal. Judgment and thought content normal.  Nursing note and vitals reviewed.   ED Course  Procedures (including critical care time) Labs Review Labs Reviewed  URINALYSIS, ROUTINE W REFLEX MICROSCOPIC - Abnormal; Notable for the following:    APPearance CLOUDY (*)    Specific Gravity, Urine 1.036 (*)    Hgb urine dipstick MODERATE (*)    Nitrite POSITIVE (*)    Leukocytes, UA SMALL (*)    All other components within normal limits  URINE MICROSCOPIC-ADD ON - Abnormal; Notable for the following:    Squamous Epithelial / LPF FEW (*)    Casts HYALINE CASTS (*)    Crystals CA OXALATE CRYSTALS (*)    All other components within normal limits  GC/CHLAMYDIA PROBE AMP  WET PREP, GENITAL  HIV ANTIBODY (ROUTINE TESTING)  RPR    Imaging Review No results found.   EKG  Interpretation None      MDM   Final diagnoses:  Worried well   Patient is a 53 year old female who presents emergency room for evaluation of possible vaginal foreign body. Physical exam reveals alert and nontoxic-appearing female with no abdominal discomfort. I performed an extensive pelvic exam with both a speculum and bimanual examination with no evidence of any foreign body that was visible or palpable on exam. GC, HIV, RPR, UA, and wet prep are pending. Patient refuses to stay for lab work results. I will not treat given no information. Patient did not have any palpable tenderness on pelvic exam. Given vaginal hysterectomy very very low suspicion for PID. I have discussed the case with Dr. Darl Householder who agrees with the above workup and plan. I have given the patient follow-up information for the women[facility] clinic outpatient. Patient states that she'll follow up with her lab results later this afternoon. Patient to return for worsening vaginal discharge, abdominal pain, intractable nausea and  vomiting. She states understanding and agreement at this time. Patient is stable for discharge.    Cherylann Parr, PA-C 04/02/14 1106  Cherylann Parr, PA-C 04/02/14 1108  Wandra Arthurs, MD 04/02/14 [phone]

## 2014-04-02 NOTE — Discharge Instructions (Signed)
Chlamydia Test This a test to see if you have Chlamydia which is a common sexually transmitted disease (STD). You get this disease by having sexual contact (oral, vaginal, or anal) with an infected person. An infected mother can spread the disease to her baby during childbirth. If this test is positive, you have a sexually transmitted disease (STD). DO NOT have unprotected sex until the treatment is complete and you have been retested and are negative. PREPARATION FOR TEST Your caregiver will use a swab to take a sample. The swab may be from the cervix, urethra, penis, anus, or throat. It may be possible to use a urine sample, if the lab where the sample is sent is able to test urine for this disease. NORMAL FINDINGS  No Growth  Antibodies: less than 1:640 Ranges for normal findings may vary among different laboratories and hospitals. You should always check with your doctor after having lab work or other tests done to discuss the meaning of your test results and whether your values are considered within normal limits. MEANING OF TEST  Your caregiver will go over the test results with you and discuss the importance and meaning of your results, as well as treatment options and the need for additional tests if necessary. OBTAINING THE TEST RESULTS It is your responsibility to obtain your test results. Ask the lab or department performing the test when and how you will get your results. Document Released: 05/02/2004 Document Revised: 07/02/2011 Document Reviewed: 03/18/2008 [employer] Patient Information 2015 [place], [place]. This information is not intended to replace advice given to you by your health care provider. Make sure you discuss any questions you have with your health care provider.

## 2014-04-03 LAB — GC/CHLAMYDIA PROBE AMP
CT Probe RNA: NEGATIVE
GC Probe RNA: NEGATIVE

## 2014-06-15 ENCOUNTER — Emergency Department (HOSPITAL_COMMUNITY)
Admission: EM | Admit: 2014-06-15 | Discharge: 2014-06-15 | Discharge status: Against Medical Advice | Payer: No Typology Code available for payment source | Attending: Emergency Medicine | Admitting: Emergency Medicine

## 2014-06-15 ENCOUNTER — Encounter (HOSPITAL_COMMUNITY): Payer: Self-pay

## 2014-06-15 DIAGNOSIS — M546 Pain in thoracic spine: Secondary | ICD-10-CM | POA: Insufficient documentation

## 2014-06-15 DIAGNOSIS — Z720 Tobacco use: Secondary | ICD-10-CM | POA: Insufficient documentation

## 2014-06-15 DIAGNOSIS — R1110 Vomiting, unspecified: Secondary | ICD-10-CM | POA: Insufficient documentation

## 2014-06-15 DIAGNOSIS — R079 Chest pain, unspecified: Secondary | ICD-10-CM | POA: Insufficient documentation

## 2014-06-15 NOTE — ED Notes (Signed)
Pt c/o intermittent central chest/epigastric pain, upper back, and emesis x 2 days.  Pain score 10/10.  Pt has not taken anything for symptoms.  Pt sts "it feels like if I could throw up really good then I'd feel better.  If I put pressure on my chest, it seems to help."

## 2014-06-15 NOTE — ED Notes (Signed)
Called pt name in lobby twice in an attempt to draw blood.  No response. RN notified.

## 2014-06-15 NOTE — ED Notes (Signed)
Pt called to triage x3 with no response.

## 2014-06-18 ENCOUNTER — Encounter (HOSPITAL_COMMUNITY): Payer: Self-pay

## 2014-06-18 ENCOUNTER — Emergency Department (HOSPITAL_COMMUNITY): Payer: No Typology Code available for payment source

## 2014-06-18 ENCOUNTER — Emergency Department (HOSPITAL_COMMUNITY)
Admission: EM | Admit: 2014-06-18 | Discharge: 2014-06-18 | Disposition: A | Discharge status: Home / Self Care | Payer: Self-pay | Attending: Emergency Medicine | Admitting: Emergency Medicine

## 2014-06-18 DIAGNOSIS — Z720 Tobacco use: Secondary | ICD-10-CM | POA: Insufficient documentation

## 2014-06-18 DIAGNOSIS — R1010 Upper abdominal pain, unspecified: Secondary | ICD-10-CM | POA: Insufficient documentation

## 2014-06-18 DIAGNOSIS — Z8639 Personal history of other endocrine, nutritional and metabolic disease: Secondary | ICD-10-CM | POA: Insufficient documentation

## 2014-06-18 DIAGNOSIS — Z9851 Tubal ligation status: Secondary | ICD-10-CM | POA: Insufficient documentation

## 2014-06-18 DIAGNOSIS — Z8619 Personal history of other infectious and parasitic diseases: Secondary | ICD-10-CM | POA: Insufficient documentation

## 2014-06-18 DIAGNOSIS — Z8541 Personal history of malignant neoplasm of cervix uteri: Secondary | ICD-10-CM | POA: Insufficient documentation

## 2014-06-18 DIAGNOSIS — R109 Unspecified abdominal pain: Secondary | ICD-10-CM

## 2014-06-18 DIAGNOSIS — Z90710 Acquired absence of both cervix and uterus: Secondary | ICD-10-CM | POA: Insufficient documentation

## 2014-06-18 DIAGNOSIS — Z7952 Long term (current) use of systemic steroids: Secondary | ICD-10-CM | POA: Insufficient documentation

## 2014-06-18 LAB — BASIC METABOLIC PANEL
ANION GAP: 8 (ref 5–15)
BUN: 14 mg/dL (ref 6–23)
CALCIUM: 9 mg/dL (ref 8.4–10.5)
CO2: 29 mmol/L (ref 19–32)
Chloride: 103 mmol/L (ref 96–112)
Creatinine, Ser: 1.2 mg/dL — ABNORMAL HIGH (ref 0.50–1.10)
GFR calc Af Amer: 58 mL/min — ABNORMAL LOW (ref >90)
GFR, EST NON AFRICAN AMERICAN: 50 mL/min — AB (ref >90)
Glucose, Bld: 118 mg/dL — ABNORMAL HIGH (ref 70–99)
Potassium: 3.5 mmol/L (ref 3.5–5.1)
Sodium: 140 mmol/L (ref 135–145)

## 2014-06-18 LAB — CBC WITH DIFFERENTIAL/PLATELET
Basophils Absolute: 0.1 10*3/uL (ref 0.0–0.1)
Basophils Relative: 1 % (ref 0–1)
EOS ABS: 0.1 10*3/uL (ref 0.0–0.7)
Eosinophils Relative: 1 % (ref 0–5)
HCT: 42.1 % (ref 36.0–46.0)
HEMOGLOBIN: 14 g/dL (ref 12.0–15.0)
Lymphocytes Relative: 21 % (ref 12–46)
Lymphs Abs: 2.3 10*3/uL (ref 0.7–4.0)
MCH: 32 pg (ref 26.0–34.0)
MCHC: 33.3 g/dL (ref 30.0–36.0)
MCV: 96.1 fL (ref 78.0–100.0)
Monocytes Absolute: 0.6 10*3/uL (ref 0.1–1.0)
Monocytes Relative: 6 % (ref 3–12)
Neutro Abs: 7.9 10*3/uL — ABNORMAL HIGH (ref 1.7–7.7)
Neutrophils Relative %: 71 % (ref 43–77)
PLATELETS: 297 10*3/uL (ref 150–400)
RBC: 4.38 MIL/uL (ref 3.87–5.11)
RDW: 13.5 % (ref 11.5–15.5)
WBC: 11 10*3/uL — ABNORMAL HIGH (ref 4.0–10.5)

## 2014-06-18 LAB — HEPATIC FUNCTION PANEL
ALK PHOS: 82 U/L (ref 39–117)
ALT: 15 U/L (ref 0–35)
AST: 16 U/L (ref 0–37)
Albumin: 4.1 g/dL (ref 3.5–5.2)
Bilirubin, Direct: <0.1 mg/dL (ref 0.0–0.5)
TOTAL PROTEIN: 7.4 g/dL (ref 6.0–8.3)
Total Bilirubin: 0.4 mg/dL (ref 0.3–1.2)

## 2014-06-18 LAB — LIPASE, BLOOD: Lipase: 23 U/L (ref 11–59)

## 2014-06-18 LAB — I-STAT TROPONIN, ED: Troponin i, poc: 0 ng/mL (ref 0.00–0.08)

## 2014-06-18 MED ORDER — PANTOPRAZOLE SODIUM 40 MG IV SOLR
40.0000 mg | Freq: Once | INTRAVENOUS | Status: AC
  Administered 2014-06-18: 40 mg via INTRAVENOUS
  Filled 2014-06-18: qty 40

## 2014-06-18 MED ORDER — ESOMEPRAZOLE MAGNESIUM 40 MG PO CPDR: 40.0000 mg | DELAYED_RELEASE_CAPSULE | Freq: Every day | ORAL | Status: DC

## 2014-06-18 MED ORDER — SODIUM CHLORIDE 0.9 % IV BOLUS (SEPSIS)
500.0000 mL | Freq: Once | INTRAVENOUS | Status: AC
  Administered 2014-06-18: 500 mL via INTRAVENOUS

## 2014-06-18 MED ORDER — OXYCODONE-ACETAMINOPHEN 5-325 MG PO TABS
2.0000 | ORAL_TABLET | Freq: Once | ORAL | Status: AC
  Administered 2014-06-18: 2 via ORAL
  Filled 2014-06-18: qty 2

## 2014-06-18 MED ORDER — ONDANSETRON HCL 4 MG/2ML IJ SOLN
4.0000 mg | Freq: Once | INTRAMUSCULAR | Status: AC
  Administered 2014-06-18: 4 mg via INTRAVENOUS
  Filled 2014-06-18: qty 2

## 2014-06-18 MED ORDER — FAMOTIDINE IN NACL 20-0.9 MG/50ML-% IV SOLN
20.0000 mg | Freq: Once | INTRAVENOUS | Status: AC
  Administered 2014-06-18: 20 mg via INTRAVENOUS
  Filled 2014-06-18: qty 50

## 2014-06-18 MED ORDER — HYDROCODONE-ACETAMINOPHEN 5-325 MG PO TABS: ORAL_TABLET | ORAL | Status: DC

## 2014-06-18 MED ORDER — ONDANSETRON HCL 4 MG PO TABS: 4.0000 mg | ORAL_TABLET | Freq: Three times a day (TID) | ORAL | Status: DC | PRN

## 2014-06-18 MED ORDER — GI COCKTAIL ~~LOC~~
30.0000 mL | Freq: Once | ORAL | Status: AC
  Administered 2014-06-18: 30 mL via ORAL
  Filled 2014-06-18: qty 30

## 2014-06-18 MED ORDER — SODIUM CHLORIDE 0.9 % IV SOLN
INTRAVENOUS | Status: DC
  Administered 2014-06-18: 09:00:00 via INTRAVENOUS

## 2014-06-18 NOTE — ED Notes (Signed)
Pt returned from XRAY 

## 2014-06-18 NOTE — ED Provider Notes (Signed)
CSN: [id number]     Arrival date & time 06/18/14  5852 History   First MD Initiated Contact with Patient 06/18/14 0740     Chief Complaint  Patient presents with  . Chest Pain  . Abdominal Pain      HPI Pt was seen at 0820.  Per pt, c/o gradual onset and persistence of constant upper abd "pain" for the past 5 days. Has been associated with several intermittent episodes of N/V.  Describes the abd pain as "burning" with radiation into her lower chest. Pt states she ran out of her nexium "a while ago." States she took one dose of prilosec without change in her symptoms.  Denies diarrhea, no fevers, no back pain, no rash, no CP/SOB, no black or blood in stools or emesis.       Past Medical History  Diagnosis Date  . SIRS (systemic inflammatory response syndrome)   . Hypokalemia   . Tobacco abuse   . Cocaine addiction   . Cancer     uterine  . Nephrolithiasis   . Pyelonephritis   . UTI (urinary tract infection) 06/2013   Past Surgical History  Procedure Laterality Date  . Renal artery stent      stones  . Tubal ligation    . Vaginal hysterectomy      History  Substance Use Topics  . Smoking status: Current Every Day Smoker -- 1.00 packs/day    Types: Cigarettes  . Smokeless tobacco: Never Used  . Alcohol Use: Yes     Comment: occasionally    Review of Systems ROS: Statement: All systems negative except as marked or noted in the HPI; Constitutional: Negative for fever and chills. ; ; Eyes: Negative for eye pain, redness and discharge. ; ; ENMT: Negative for ear pain, hoarseness, nasal congestion, sinus pressure and sore throat. ; ; Cardiovascular: Negative for chest pain, palpitations, diaphoresis, dyspnea and peripheral edema. ; ; Respiratory: Negative for cough, wheezing and stridor. ; ; Gastrointestinal: +abd pain, N/V. Negative for diarrhea, blood in stool, hematemesis, jaundice and rectal bleeding. . ; ; Genitourinary: Negative for dysuria, flank pain and hematuria. ; ;  Musculoskeletal: Negative for back pain and neck pain. Negative for swelling and trauma.; ; Skin: Negative for pruritus, rash, abrasions, blisters, bruising and skin lesion.; ; Neuro: Negative for headache, lightheadedness and neck stiffness. Negative for weakness, altered level of consciousness , altered mental status, extremity weakness, paresthesias, involuntary movement, seizure and syncope.      Allergies  Darvocet; Ibuprofen; and Tramadol  Home Medications   Prior to Admission medications   Medication Sig Start Date End Date Taking? Authorizing Provider  esomeprazole (NEXIUM) 20 MG capsule Take 40 mg by mouth daily as needed (heartburn/indigestion).    Yes Historical Provider, MD  HYDROcodone-acetaminophen (NORCO/VICODIN) 5-325 MG per tablet Take 1 tablet by mouth every 6 (six) hours as needed for moderate pain. Patient not taking: Reported on 04/02/2014 01/16/14   Garald Balding, NP  ondansetron (ZOFRAN-ODT) 4 MG disintegrating tablet Take 1 tablet (4 mg total) by mouth every 8 (eight) hours as needed for nausea or vomiting. Patient not taking: Reported on 04/02/2014 01/16/14   Garald Balding, NP  predniSONE (DELTASONE) 10 MG tablet Take 2 tablets (20 mg total) by mouth daily. Patient not taking: Reported on 04/02/2014 01/16/14   Garald Balding, NP   BP 118/78 mmHg  Pulse 71  Temp(Src) 97.7 F (36.5 C) (Oral)  Resp 17  Ht 5\' 8"  (1.727 m)  Wt 210 lb (95.255 kg)  BMI 31.94 kg/m2  SpO2 97% Physical Exam  0825: Physical examination:  Nursing notes reviewed; Vital signs and O2 SAT reviewed;  Constitutional: Well developed, Well nourished, Well hydrated, In no acute distress; Head:  Normocephalic, atraumatic; Eyes: EOMI, PERRL, No scleral icterus; ENMT: Mouth and pharynx normal, Mucous membranes moist; Neck: Supple, Full range of motion, No lymphadenopathy; Cardiovascular: Regular rate and rhythm, No murmur, rub, or gallop; Respiratory: Breath sounds clear & equal bilaterally, No rales,  rhonchi, wheezes.  Speaking full sentences with ease, Normal respiratory effort/excursion; Chest: Nontender, Movement normal; Abdomen: Soft, +mid-epigastric tenderness to palp. No rebound or guarding. Nondistended, Normal bowel sounds; Genitourinary: No CVA tenderness; Extremities: Pulses normal, No tenderness, No edema, No calf edema or asymmetry.; Neuro: AA&Ox3, Major CN grossly intact.  Speech clear. No gross focal motor or sensory deficits in extremities.; Skin: Color normal, Warm, Dry.   ED Course  Procedures      EKG Interpretation   Date/Time:  Friday June 18 2014 06:46:50 EST Ventricular Rate:  70 PR Interval:  167 QRS Duration: 81 QT Interval:  407 QTC Calculation: 439 R Axis:   78 Text Interpretation:  Age not entered, assumed to be  54 years old for  purpose of ECG interpretation Sinus rhythm Artifact No significant change  was found Confirmed by MOLPUS  MD, Jenny Reichmann ([id number]) on 06/18/2014 6:56:48 AM      MDM  MDM Reviewed: nursing note, vitals and previous chart Reviewed previous: labs and ECG Interpretation: labs, x-ray and ECG     Results for orders placed or performed during the hospital encounter of 41/63/84  Basic metabolic panel  Result Value Ref Range   Sodium 140 135 - 145 mmol/L   Potassium 3.5 3.5 - 5.1 mmol/L   Chloride 103 96 - 112 mmol/L   CO2 29 19 - 32 mmol/L   Glucose, Bld 118 (H) 70 - 99 mg/dL   BUN 14 6 - 23 mg/dL   Creatinine, Ser 1.20 (H) 0.50 - 1.10 mg/dL   Calcium 9.0 8.4 - 10.5 mg/dL   GFR calc non Af Amer 50 (L) >90 mL/min   GFR calc Af Amer 58 (L) >90 mL/min   Anion gap 8 5 - 15  CBC with Differential  Result Value Ref Range   WBC 11.0 (H) 4.0 - 10.5 K/uL   RBC 4.38 3.87 - 5.11 MIL/uL   Hemoglobin 14.0 12.0 - 15.0 g/dL   HCT 42.1 36.0 - 46.0 %   MCV 96.1 78.0 - 100.0 fL   MCH 32.0 26.0 - 34.0 pg   MCHC 33.3 30.0 - 36.0 g/dL   RDW 13.5 11.5 - 15.5 %   Platelets 297 150 - 400 K/uL   Neutrophils Relative % 71 43 - 77 %   Neutro  Abs 7.9 (H) 1.7 - 7.7 K/uL   Lymphocytes Relative 21 12 - 46 %   Lymphs Abs 2.3 0.7 - 4.0 K/uL   Monocytes Relative 6 3 - 12 %   Monocytes Absolute 0.6 0.1 - 1.0 K/uL   Eosinophils Relative 1 0 - 5 %   Eosinophils Absolute 0.1 0.0 - 0.7 K/uL   Basophils Relative 1 0 - 1 %   Basophils Absolute 0.1 0.0 - 0.1 K/uL  Hepatic function panel  Result Value Ref Range   Total Protein 7.4 6.0 - 8.3 g/dL   Albumin 4.1 3.5 - 5.2 g/dL   AST 16 0 - 37 U/L   ALT 15 0 -  35 U/L   Alkaline Phosphatase 82 39 - 117 U/L   Total Bilirubin 0.4 0.3 - 1.2 mg/dL   Bilirubin, Direct <0.1 0.0 - 0.5 mg/dL   Indirect Bilirubin NOT CALCULATED 0.3 - 0.9 mg/dL  Lipase, blood  Result Value Ref Range   Lipase 23 11 - 59 U/L  I-stat troponin, ED (not at [facility])  Result Value Ref Range   Troponin i, poc 0.00 0.00 - 0.08 ng/mL   Comment 3           Dg Abd Acute W/chest 06/18/2014   CLINICAL DATA:  Chest and upper abdomen pain for several days  EXAM: ACUTE ABDOMEN SERIES (ABDOMEN 2 VIEW & CHEST 1 VIEW)  COMPARISON:  CT abdomen and pelvis July 13, 2013; chest radiograph January 16, 2014  FINDINGS: PA chest: There is no edema or consolidation. Heart size and pulmonary vascularity are normal. No adenopathy.  Supine and upright abdomen: There is diffuse stool throughout the colon. There is no bowel dilatation or air-fluid level suggesting obstruction. No free air. There are small phleboliths in the pelvis.  IMPRESSION: Diffuse stool throughout colon. Overall bowel gas pattern unremarkable. No obstruction or free air. Lungs clear.   Electronically Signed   By: Lowella Grip III M.D.   On: 06/18/2014 09:17     1230:  Pt has tol PO well while in the ED without N/V.  No stooling while in the ED.  Abd benign, VSS. Feels better and wants to go home now. VS per baseline.  Doubt ACS as cause for symptoms with normal troponin and unchanged EKG from previous after 3-4 days of constant atypical symptoms. Tx symptomatically at this  time. Dx and testing d/w pt and family.  Questions answered.  Verb understanding, agreeable to d/c home with outpt f/u.    Francine Graven, DO 06/21/14 1109

## 2014-06-18 NOTE — Progress Notes (Signed)
  CARE MANAGEMENT ED NOTE 06/18/2014  Patient:  PALAK, TERCERO   Account Number:  [id number]  Date Initiated:  06/18/2014  Documentation initiated by:  Jackelyn Poling  Subjective/Objective Assessment:   54 yr old self pay [facility] pt was seen here two days PTA for same and left AMA.  Patient has been out of Nexium for "a while." c/o chest and abdomin pain     Subjective/Objective Assessment Detail:   no pcp as confirmed by pt States she had an orange card but it is no longer active "needs renewed"     Action/Plan:   spoke with pt see notes below   Action/Plan Detail:   Anticipated DC Date:  06/18/2014     Status Recommendation to Physician:   Result of Recommendation:    Other ED Services  Consult Working [place]  Other  Outpatient Services - Pt will follow up  PCP issues  [facility] / [facility] (established/new)    Choice offered to / List presented to:            Status of service:  Completed, signed off  ED Comments:   ED Comments Detail:  CM spoke with pt who confirms self pay [facility] resident with no pcp. CM discussed and provided written information for self pay pcps, importance of pcp for f/u care, www.needymeds.org, www.goodrx.com, discounted pharmacies and other [employer] such as [employer], affordable care act,  financial assistance, DSS and  health department  Reviewed resources for [employer] self pay pcps like [facility], family medicine at [address], [facility] family practice, general medical clinics, [facility] urgent care plus others, medication resources, [facility] out patient pharmacies and housing Pt voiced understanding and appreciation of resources provided  Provided [facility] contact information for pt to f/u. Pt agreed to [facility] referral and referral completed

## 2014-06-18 NOTE — ED Notes (Signed)
Pt transported to XRAY °

## 2014-06-18 NOTE — Discharge Instructions (Signed)
°Emergency Department Resource Guide °1) Find a Doctor and Pay Out of Pocket °Although you won't have to find out who is covered by your insurance plan, it is a good idea to ask around and get recommendations. You will then need to call the office and see if the doctor you have chosen will accept you as a new patient and what types of options they offer for patients who are self-pay. Some doctors offer discounts or will set up payment plans for their patients who do not have insurance, but you will need to ask so you aren't surprised when you get to your appointment. ° °2) Contact Your Local Health Department °Not all health departments have doctors that can see patients for sick visits, but many do, so it is worth a call to see if yours does. If you don't know where your local health department is, you can check in your phone book. The CDC also has a tool to help you locate your state's health department, and many state websites also have listings of all of their local health departments. ° °3) Find a Walk-in Clinic °If your illness is not likely to be very severe or complicated, you may want to try a walk in clinic. These are popping up all over the country in pharmacies, drugstores, and shopping centers. They're usually staffed by nurse practitioners or physician assistants that have been trained to treat common illnesses and complaints. They're usually fairly quick and inexpensive. However, if you have serious medical issues or chronic medical problems, these are probably not your best option. ° °No Primary Care Doctor: °- Call [facility] at  832-8000 - they can help you locate a primary care doctor that  accepts your insurance, provides certain services, etc. °- Physician Referral Service- 1-800-533-3463 ° °Chronic Pain Problems: °Organization         Address  Phone   Notes  °[facility]  (336) 297-2271 Patients need to be referred by their primary care doctor.  ° °Medication  Assistance: °Organization         Address  Phone   Notes  °[place] [facility] [address] °[place], [place] [address] (336) 641-8030 --Must be a resident of [place] °-- Must have NO insurance coverage whatsoever (no Medicaid/ Medicare, etc.) °-- The pt. MUST have a primary care doctor that directs their care regularly and follows them in the community °  °[facility]  (866) 331-1348   °[employer]  (888) 892-1162   ° °Agencies that provide inexpensive medical care: °Organization         Address  Phone   Notes  °[facility]  (336) 832-8035   °[facility]    (336) 832-7272   °[facility] [address] °[place], [place] [address] (336) 832-4777   °[facility] [address], °[place] (336) 271-4999   °[facility]    (336) 373-0678   °[facility]    (336) 272-1050   °[facility] ° [address], [place] Phone:  (336) 832-4444, Fax:  (336) 832-4440 Hours of Operation:  9 am - 6 pm, M-F.  Also accepts Medicaid/Medicare and self-pay.  °[facility] ° [address],  Phone: (336) 832-3150, Fax: (336) 832-3151. Hours of Operation:  8:30 am - 5:30 pm, M-F.  Also accepts Medicaid and self-pay.  °[facility] [place] [address], [place] Phone: (336) 878-6027   °[facility] [address], [place], [place] (336)723-1848, Ext. 123 Mondays & Thursdays: 7-9 AM.  First 15 patients are seen on a first come, first serve basis. °  ° °Medicaid-accepting [place] Providers: ° °Organization         Address  Phone   Notes  °[facility] [address], [place] (336) 641-2100 Also accepts self-pay patients.  °[facility] [address], [place] ° (336) 856-9996   °[facility] [address], [place]  (336) 288-8857   °[facility] [address] [place] [address], [place] (336) 299-7000   °Veita Bland [address], [place]  ° (336) 373-1557 Only accepts [place] Access Medicaid patients after they have their name applied to their card.  ° °Self-Pay (no insurance) in [place]: ° °Organization         Address  Phone   Notes  °Sickle Cell Patients, [facility] [address], [place] (336) 832-1970   °[facility] [address], [place] (336) 832-4400   °[facility] ° [address], [place] (336) 992-4800   °[facility]/Dr. Osei-Bonsu ° [address] [place] [address], [place] or [address], [place] (336) 841-8500 Phone number for both [place] and [place] locations is the same.  °[facility] [address], [place] (336) 299-0000   °[facility] [address] [place] [address], [place] or [address] (336) 852-7530 °(336) 878-2260   °[facility] [address],  (336) 350-1642, phone; (336) 294-5005, fax Sees patients 1st and 3rd Saturday of every month.  Must not qualify for public or private insurance (i.e. Medicaid, Medicare, [place] Health Choice, Veterans' Benefits) • Household income should be no more than 200% of the poverty level •The clinic cannot treat you if you are pregnant or think you are pregnant • Sexually transmitted diseases are not treated at the clinic.  ° ° °Dental Care: °Organization         Address  Phone  Notes  °[place] [facility] [address],  (336) 641-6152 Accepts children up to age 21 who are enrolled in Medicaid or [place] Health Choice; pregnant women with a Medicaid card; and children who have applied for Medicaid or [place] Health Choice, but were declined, whose parents can pay a reduced fee at time of service.  °[place]  [facility] [place]  [address], [place] (336) 641-7733 Accepts children up to age 21 who are enrolled in Medicaid or [place] Health Choice; pregnant women with a Medicaid card; and children who have applied for Medicaid or [place] Health Choice, but were declined, whose parents can pay a reduced fee at time of service.  °[facility] ° [address],  (336) 641-4533 Patients are seen by appointment only. Walk-ins are not accepted. [facility] will see patients 18 years of age and older. °Monday - Tuesday (8am-5pm) °Most Wednesdays (8:30-5pm) °$30 per visit, cash only  °[facility] ° [address], [place] (336) 641-4533 Patients are seen by appointment only. Walk-ins are not accepted. [facility] will see patients 18 years of age and older. °One   Wednesday Evening (Monthly: Volunteer Based).  $30 per visit, cash only  °[facility]  (919) 537-3737 for adults; Children under age 4, call Graduate Pediatric Dentistry at (919) [phone]. Children aged 4-14, please call (919) [phone] to request a pediatric application. ° Dental services are provided in all areas of dental care including fillings, crowns and bridges, complete and partial dentures, implants, gum treatment, root canals, and extractions. Preventive care is also provided. Treatment is provided to both adults and children. °Patients are selected via a lottery and there is often a waiting list. °  °[facility] [address], °[place] ° (336) 763-8833 www.drcivils.com °  °[facility] [address], [place], [place] (336)723-1848, Ext. 123 Second and Fourth Thursday of each month, opens at 6:30 AM; Clinic ends at 9 AM.  Patients are seen on a first-come first-served basis, and a limited number are seen during each clinic.  ° °[facility] ° [address], [place], [place] (336) 723-7904    Eligibility Requirements °You must have lived in [place], or [place] counties for at least the last three months. °  You cannot be eligible for state or federal sponsored healthcare insurance, including Veterans Administration, Medicaid, or Medicare. °  You generally cannot be eligible for healthcare insurance through your employer.  °  How to apply: °Eligibility screenings are held every Tuesday and Wednesday afternoon from 1:00 pm until 4:00 pm. You do not need an appointment for the interview!  °[facility] [address], [place], [place] 336-631-2330   °[facility]  336-342-8273   °[place]  336-703-3100   °[facility]  336-570-6415   ° °Behavioral Health Resources in the Community: °Intensive Outpatient Programs °Organization         Address  Phone  Notes  °[facility] [address], [place], [place] 336-878-6098   °[facility] [address], [place], [place] 336-832-9800   °[facility] [address], [place], [place] ° 336-882-2125   °[facility] [address],  °[place], [place] 1-800-853-5163 or 336-641-4981   °Substance Abuse Resources °Organization         Address  Phone  Notes  °[facility]  336-882-2125   °[facility]  336-784-9470   °[facility]  336-285-9073   °[facility]  336-845-3988   °Residential & Outpatient Substance Abuse Program  1-800-659-3381   °Psychological Services °Organization         Address  Phone  Notes  °[facility]  336- 832-9600   °[facility]  336- 378-7881   °[facility] [address], [place] 1-800-853-5163 or 336-641-4981   ° °Mobile Crisis Teams °Organization         Address  Phone  Notes  °[facility], Mobile Crisis Care Unit  1-877-626-1772   °Assertive °[facility] ° [address]  [place], [place] 336-834-9664   °Sharon DeEsch [address] °[place] 336-554-5454   ° °Self-Help/Support Groups °Organization         Address  Phone             Notes  °[facility] - variety of support groups  336- 373-1402 Call for more information  °Narcotics Anonymous (NA), [facility] [address], °[place]  2 meetings at this location  ° °  Residential Treatment Programs Organization         Address  Phone  Notes  [facility] [address],    [place]  1-(678)006-8782   [facility]  [address], [place] [id number], [place], [place]   [facility], [place] [phone] Admissions: 8am-3pm M-F  [facility] [place] [address],    [place], [place] 158-309-4076   [facility] [address] [place], [place], [place]   [facility] [address],  [place], [place]   [facility] Intensive Outpatient [address], [place], [place]   [facility] ([place] [address]  [place], [place] 1-(432)822-1281 or (502)874-7898   [facility] [address] [place], [place] Accepts Medicaid  [facility] [address]  [place] 1-(671)371-0229 Substance Abuse/Addiction Treatment   [facility]         Address  Phone  Notes  [facility]  (260)232-8555   Domenic Schwab, PhD [address] [place], [place]   612-613-4243 or 503-173-5230   [facility], [place] (209)677-2459   [facility] [address], [place], [place] 586-195-5956 Insurance/Medicaid/sponsorship through [facility] [address]                                    [place], [place] (919)690-7170 [place] [address], [place] [phone]    Dr. Adele Schilder  [phone]   [facility] Dept. 1) [address] [place] 2) [place] 3)  [place] [address], [place] [phone]  [phone]   [place] 281 805 4507 or [phone] (After Hours)      Eat a bland diet, avoiding greasy, fatty, fried foods, as well as spicy and acidic foods or beverages.  Avoid eating within the hour or 2 before going to bed or laying down.  Also avoid teas, colas, coffee, chocolate, pepermint and spearment.  Take over the counter pepcid, one tablet by mouth twice a day, for the next 2 to 3 weeks.  May also take over the counter maalox/mylanta, as directed on packaging, as needed for discomfort.  Take the prescriptions as directed.  Call your regular medical doctor today to schedule a follow up appointment in the next 3 days. Call the GI doctor today to schedule a follow up appointment within the next week.  Return to the Emergency Department immediately if worsening.

## 2014-06-18 NOTE — ED Notes (Signed)
Patient arrives via EMS.  Per EMS, patient was seen here two days PTA for same and left AMA.  Patient has been out of Nexium for "a while."

## 2014-06-18 NOTE — ED Notes (Signed)
MD at Bedside.

## 2014-06-18 NOTE — ED Notes (Signed)
Bed: GB20 Expected date:  Expected time:  Means of arrival:  Comments: EMS chest and abdominal pain

## 2014-06-18 NOTE — ED Notes (Addendum)
Pt reports epigastric pain x 4 days. Heart rate 80 NSR. Pt c/o nausea.

## 2014-06-18 NOTE — ED Notes (Signed)
Pt yelling at visitor. Both were informed that yelling in our department is inappropriate.

## 2014-06-18 NOTE — ED Notes (Signed)
Pt states "it hurts bad", however falls asleep during protonix administration.

## 2014-10-22 ENCOUNTER — Emergency Department (HOSPITAL_COMMUNITY)
Admission: EM | Admit: 2014-10-22 | Discharge: 2014-10-23 | Disposition: A | Discharge status: Home / Self Care | Payer: No Typology Code available for payment source | Attending: Emergency Medicine | Admitting: Emergency Medicine

## 2014-10-22 ENCOUNTER — Emergency Department (HOSPITAL_COMMUNITY): Payer: No Typology Code available for payment source

## 2014-10-22 ENCOUNTER — Encounter (HOSPITAL_COMMUNITY): Payer: Self-pay | Admitting: Emergency Medicine

## 2014-10-22 DIAGNOSIS — Z87448 Personal history of other diseases of urinary system: Secondary | ICD-10-CM | POA: Insufficient documentation

## 2014-10-22 DIAGNOSIS — Z8639 Personal history of other endocrine, nutritional and metabolic disease: Secondary | ICD-10-CM | POA: Insufficient documentation

## 2014-10-22 DIAGNOSIS — Z720 Tobacco use: Secondary | ICD-10-CM | POA: Insufficient documentation

## 2014-10-22 DIAGNOSIS — Z8542 Personal history of malignant neoplasm of other parts of uterus: Secondary | ICD-10-CM | POA: Insufficient documentation

## 2014-10-22 DIAGNOSIS — Z87440 Personal history of urinary (tract) infections: Secondary | ICD-10-CM | POA: Insufficient documentation

## 2014-10-22 DIAGNOSIS — T405X1A Poisoning by cocaine, accidental (unintentional), initial encounter: Secondary | ICD-10-CM | POA: Insufficient documentation

## 2014-10-22 DIAGNOSIS — Z87442 Personal history of urinary calculi: Secondary | ICD-10-CM | POA: Insufficient documentation

## 2014-10-22 DIAGNOSIS — Z8719 Personal history of other diseases of the digestive system: Secondary | ICD-10-CM | POA: Insufficient documentation

## 2014-10-22 DIAGNOSIS — T50901A Poisoning by unspecified drugs, medicaments and biological substances, accidental (unintentional), initial encounter: Secondary | ICD-10-CM

## 2014-10-22 DIAGNOSIS — Z79899 Other long term (current) drug therapy: Secondary | ICD-10-CM | POA: Insufficient documentation

## 2014-10-22 LAB — ACETAMINOPHEN LEVEL: Acetaminophen (Tylenol), Serum: <10 ug/mL — ABNORMAL LOW (ref 10–30)

## 2014-10-22 LAB — ETHANOL: Alcohol, Ethyl (B): <5 mg/dL (ref <5)

## 2014-10-22 LAB — SALICYLATE LEVEL: Salicylate Lvl: 4.6 mg/dL (ref 2.8–30.0)

## 2014-10-22 MED ORDER — NALOXONE HCL 1 MG/ML IJ SOLN
INTRAMUSCULAR | Status: AC
Start: 2014-10-22 — End: 2014-10-22
  Administered 2014-10-22: 2 mg
  Filled 2014-10-22: qty 2

## 2014-10-22 NOTE — ED Notes (Signed)
Pt states she took some cocaine and a "bump" of heroin tonight. Pt placed in gurney and had decreased respirations and became unarousable. MD notified. Narcan 2mg  given. Pt responded to Narcan.

## 2014-10-22 NOTE — ED Provider Notes (Signed)
CSN: [id number]     Arrival date & time 10/22/14  2241 History   First MD Initiated Contact with Patient 10/22/14 2307     Chief Complaint  Patient presents with  . Drug Overdose      HPI  Presents for evaluation after an intentional drug ingestion overdose. States that she and her husband were "sniffing something". Both became unresponsive and required Narcan.  Pt presents somnolent. Not apneic. Not vomiting or clinically aspirating.  Past Medical History  Diagnosis Date  . SIRS (systemic inflammatory response syndrome)   . Hypokalemia   . Tobacco abuse   . Cocaine addiction   . Cancer     uterine  . Nephrolithiasis   . Pyelonephritis   . UTI (urinary tract infection) 06/2013   Past Surgical History  Procedure Laterality Date  . Renal artery stent      stones  . Tubal ligation    . Vaginal hysterectomy     No family history on file. History  Substance Use Topics  . Smoking status: Current Every Day Smoker -- 1.00 packs/day    Types: Cigarettes  . Smokeless tobacco: Never Used  . Alcohol Use: Yes     Comment: occasionally   OB History    No data available     Review of Systems  Constitutional: Negative for fever, chills, diaphoresis, appetite change and fatigue.  HENT: Negative for mouth sores, sore throat and trouble swallowing.   Eyes: Negative for visual disturbance.  Respiratory: Negative for cough, chest tightness, shortness of breath and wheezing.        Decreased respirations   Cardiovascular: Negative for chest pain.  Gastrointestinal: Negative for nausea, vomiting, abdominal pain, diarrhea and abdominal distention.  Endocrine: Negative for polydipsia, polyphagia and polyuria.  Genitourinary: Negative for dysuria, frequency and hematuria.  Musculoskeletal: Negative for gait problem.  Skin: Negative for color change, pallor and rash.  Neurological: Negative for dizziness, syncope, light-headedness and headaches.        decreased level of  consciousness.  Hematological: Does not bruise/bleed easily.  Psychiatric/Behavioral: Negative for behavioral problems and confusion.      Allergies  Darvocet; Ibuprofen; and Tramadol  Home Medications   Prior to Admission medications   Medication Sig Start Date End Date Taking? Authorizing Provider  esomeprazole (NEXIUM) 40 MG capsule Take 1 capsule (40 mg total) by mouth daily. Patient not taking: Reported on 10/22/2014 06/18/14   Francine Graven, DO  HYDROcodone-acetaminophen (NORCO/VICODIN) 5-325 MG per tablet 1 or 2 tabs PO q6 hours prn pain Patient not taking: Reported on 10/22/2014 06/18/14   Francine Graven, DO  ondansetron (ZOFRAN) 4 MG tablet Take 1 tablet (4 mg total) by mouth every 8 (eight) hours as needed for nausea or vomiting. Patient not taking: Reported on 10/22/2014 06/18/14   Francine Graven, DO  ondansetron (ZOFRAN-ODT) 4 MG disintegrating tablet Take 1 tablet (4 mg total) by mouth every 8 (eight) hours as needed for nausea or vomiting. Patient not taking: Reported on 04/02/2014 01/16/14   Junius Creamer, NP  predniSONE (DELTASONE) 10 MG tablet Take 2 tablets (20 mg total) by mouth daily. Patient not taking: Reported on 04/02/2014 01/16/14   Junius Creamer, NP   BP 108/48 mmHg  Pulse 87  Temp(Src) 97.4 F (36.3 C) (Oral)  Resp 15  SpO2 100% Physical Exam  Constitutional: She is oriented to person, place, and time. She appears well-developed and well-nourished. She appears lethargic. No distress.  HENT:  Head: Normocephalic.  Eyes: Conjunctivae are normal.  Pupils are equal, round, and reactive to light. No scleral icterus.  Neck: Normal range of motion. Neck supple. No thyromegaly present.  Cardiovascular: Normal rate and regular rhythm.  Exam reveals no gallop and no friction rub.   No murmur heard. Pulmonary/Chest: Effort normal and breath sounds normal. No respiratory distress. She has no wheezes. She has no rales.  Slurring speech. Spot in his respirations.. Not  apneic.  Abdominal: Soft. Bowel sounds are normal. She exhibits no distension. There is no tenderness. There is no rebound.  Musculoskeletal: Normal range of motion.  Neurological: She is oriented to person, place, and time. She appears lethargic.  Skin: Skin is warm and dry. No rash noted.  Psychiatric: She has a normal mood and affect. Her behavior is normal.    ED Course  Procedures (including critical care time) Labs Review Labs Reviewed  ETHANOL  ACETAMINOPHEN LEVEL  SALICYLATE LEVEL  URINE RAPID DRUG SCREEN, HOSP PERFORMED    Imaging Review Dg Chest Port 1 View  10/22/2014   CLINICAL DATA:  Overdose  EXAM: PORTABLE CHEST - 1 VIEW  COMPARISON:  06/18/2014  FINDINGS: There is mild crowding of the basilar markings due to a shallow inspiration. There is no confluent airspace consolidation. There is no large effusion. Hilar and mediastinal contours are unremarkable.  IMPRESSION: No active disease.   Electronically Signed   By: Andreas Newport M.D.   On: 10/22/2014 23:33     EKG Interpretation None      MDM   Final diagnoses:  Overdose    She given 2 mg Narcan IV times. Immediately more awake. 100% on room air. Clear lungs. X-ray does not show pulmonary edema or any acute abnormalities. Plan will be for hour ED obs.  If no additional rescue is required, would be safe for DC.    Tanna Furry, MD 10/22/14 [phone]

## 2014-10-22 NOTE — ED Notes (Signed)
Pt. On monitor. 

## 2014-10-22 NOTE — ED Notes (Signed)
Pt with large amount of cash on person.  Cash taken and counted by this Herbalist Autumn D in front of [employer] Officer [place], who also recorded during the count.  Cash amounted to $1, 006.00  Cash separated according to bills, number of bills counted individually, then placed in labeled security envelope, which was signed by patient, and taken to security office to be locked in safe.   Pt confirmed amount of $1, 006.00 and informed that it will be locked in security safe.

## 2014-10-22 NOTE — ED Notes (Signed)
3 people in the ED lobby requesting to see pt, 2 males identifying themselves as friends and female who has identified herself as sister name is Judson Roch. Advised pt not able have visitor at this time and I(nurse first) is not able to provide information of any nature about the pt per the law.

## 2014-10-22 NOTE — ED Notes (Signed)
EKG given to EDP,James, MD., for review. 

## 2014-10-23 NOTE — Discharge Instructions (Signed)
Accidental Overdose °A drug overdose occurs when a chemical substance (drug or medication) is used in amounts large enough to overcome a person. This may result in severe illness or death. This is a type of poisoning. Accidental overdoses of medications or other substances come from a variety of reasons. When this happens accidentally, it is often because the person taking the substance does not know enough about what they have taken. Drugs which commonly cause overdose deaths are alcohol, psychotropic medications (medications which affect the mind), pain medications, illegal drugs (street drugs) such as cocaine and heroin, and multiple drugs taken at the same time. It may result from careless behavior (such as over-indulging at a party). Other causes of overdose may include multiple drug use, a lapse in memory, or drug use after a period of no drug use.  °Sometimes overdosing occurs because a person cannot remember if they have taken their medication.  °A common unintentional overdose in young children involves multi-vitamins containing iron. Iron is a part of the hemoglobin molecule in blood. It is used to transport oxygen to living cells. When taken in small amounts, iron allows the body to restock hemoglobin. In large amounts, it causes problems in the body. If this overdose is not treated, it can lead to death. °Never take medicines that show signs of tampering or do not seem quite right. Never take medicines in the dark or in poor lighting. Read the label and check each dose of medicine before you take it. When adults are poisoned, it happens most often through carelessness or lack of information. Taking medicines in the dark or taking medicine prescribed for someone else to treat the same type of problem is a dangerous practice. °SYMPTOMS  °Symptoms of overdose depend on the medication and amount taken. They can vary from over-activity with stimulant over-dosage, to sleepiness from depressants such as  alcohol, narcotics and tranquilizers. Confusion, dizziness, nausea and vomiting may be present. If problems are severe enough coma and death may result. °DIAGNOSIS  °Diagnosis and management are generally straightforward if the drug is known. Otherwise it is more difficult. At times, certain symptoms and signs exhibited by the patient, or blood tests, can reveal the drug in question.  °TREATMENT  °In an emergency department, most patients can be treated with supportive measures. Antidotes may be available if there has been an overdose of opioids or benzodiazepines. A rapid improvement will often occur if this is the cause of overdose. °At home or away from medical care: °· There may be no immediate problems or warning signs in children. °· Not everything works well in all cases of poisoning. °· Take immediate action. Poisons may act quickly. °· If you think someone has swallowed medicine or a household product, and the person is unconscious, having seizures (convulsions), or is not breathing, immediately call for an ambulance. °IF a person is conscious and appears to be doing OK but has swallowed a poison: °· Do not wait to see what effect the poison will have. Immediately call a poison control center (listed in the white pages of your telephone book under "Poison Control" or inside the front cover with other emergency numbers). Some poison control centers have TTY capability for the deaf. Check with your local center if you or someone in your family requires this service. °· Keep the container so you can read the label on the product for ingredients. °· Describe what, when, and how much was taken and the age and condition of the person poisoned.   Describe what, when, and how much was taken and the age and condition of the person poisoned. Inform them if the person is vomiting, choking, drowsy, shows a change in color or temperature of skin, is conscious or unconscious, or is convulsing.   Do not cause vomiting unless instructed by medical personnel. Do not induce vomiting or force liquids into a person who  is convulsing, unconscious, or very drowsy.  Stay calm and in control.    Activated charcoal also is sometimes used in certain types of poisoning and you may wish to add a supply to your emergency medicines. It is available without a prescription. Call a poison control center before using this medication.  PREVENTION   Thousands of children die every year from unintentional poisoning. This may be from household chemicals, poisoning from carbon monoxide in a car, taking their parent's medications, or simply taking a few iron pills or vitamins with iron. Poisoning comes from unexpected sources.   Store medicines out of the sight and reach of children, preferably in a locked cabinet. Do not keep medications in a food cabinet. Always store your medicines in a secure place. Get rid of expired medications.   If you have children living with you or have them as occasional guests, you should have child-resistant caps on your medicine containers. Keep everything out of reach. Child proof your home.   If you are called to the telephone or to answer the door while you are taking a medicine, take the container with you or put the medicine out of the reach of small children.   Do not take your medication in front of children. Do not tell your child how good a medication is and how good it is for them. They may get the idea it is more of a treat.   If you are an adult and have accidentally taken an overdose, you need to consider how this happened and what can be done to prevent it from happening again. If this was from a street drug or alcohol, determine if there is a problem that needs addressing. If you are not sure a problems exists, it is easy to talk to a professional and ask them if they think you have a problem. It is better to handle this problem in this way before it happens again and has a much worse consequence.  Document Released: 06/23/2004 Document Revised: 07/02/2011 Document Reviewed: 11/29/2008  [employer]  Patient Information 2015 [employer]. This information is not intended to replace advice given to you by your health care provider. Make sure you discuss any questions you have with your health care provider.

## 2014-10-23 NOTE — ED Notes (Signed)
Pt able to ambulate well in the hall--- pt denies dizziness; pt's gait and balance steady.

## 2014-10-23 NOTE — ED Provider Notes (Signed)
Patient is alert. She is tachycardic no more Narcan in the emergency department. She's been observed for 4 hours. Her vital signs remained stable. She is ambulatory without difficulty. Have reiterated the dangers of heroin use and patient has voiced understanding. We'll discharge home. Return precautions given.  Julianne Rice, MD 10/23/14 [phone]

## 2014-10-23 NOTE — ED Notes (Addendum)
Pt. Attempted to give urine but unsuccessful. 

## 2014-11-10 ENCOUNTER — Encounter (HOSPITAL_COMMUNITY): Payer: Self-pay | Admitting: *Deleted

## 2014-11-10 ENCOUNTER — Emergency Department (HOSPITAL_COMMUNITY)
Admission: EM | Admit: 2014-11-10 | Discharge: 2014-11-10 | Disposition: A | Discharge status: Home / Self Care | Payer: No Typology Code available for payment source | Attending: Emergency Medicine | Admitting: Emergency Medicine

## 2014-11-10 DIAGNOSIS — Z87440 Personal history of urinary (tract) infections: Secondary | ICD-10-CM | POA: Insufficient documentation

## 2014-11-10 DIAGNOSIS — Z8619 Personal history of other infectious and parasitic diseases: Secondary | ICD-10-CM | POA: Insufficient documentation

## 2014-11-10 DIAGNOSIS — Z720 Tobacco use: Secondary | ICD-10-CM | POA: Insufficient documentation

## 2014-11-10 DIAGNOSIS — Z8542 Personal history of malignant neoplasm of other parts of uterus: Secondary | ICD-10-CM | POA: Insufficient documentation

## 2014-11-10 DIAGNOSIS — J069 Acute upper respiratory infection, unspecified: Secondary | ICD-10-CM

## 2014-11-10 DIAGNOSIS — Z87448 Personal history of other diseases of urinary system: Secondary | ICD-10-CM | POA: Insufficient documentation

## 2014-11-10 DIAGNOSIS — Z8639 Personal history of other endocrine, nutritional and metabolic disease: Secondary | ICD-10-CM | POA: Insufficient documentation

## 2014-11-10 DIAGNOSIS — Z792 Long term (current) use of antibiotics: Secondary | ICD-10-CM | POA: Insufficient documentation

## 2014-11-10 DIAGNOSIS — L237 Allergic contact dermatitis due to plants, except food: Secondary | ICD-10-CM

## 2014-11-10 DIAGNOSIS — Z87442 Personal history of urinary calculi: Secondary | ICD-10-CM | POA: Insufficient documentation

## 2014-11-10 MED ORDER — AZITHROMYCIN 250 MG PO TABS: ORAL_TABLET | ORAL | Status: DC

## 2014-11-10 MED ORDER — PREDNISONE 20 MG PO TABS: 20.0000 mg | ORAL_TABLET | Freq: Every day | ORAL | Status: DC

## 2014-11-10 NOTE — ED Provider Notes (Signed)
CSN: [id number]     Arrival date & time 11/10/14  2020 History   First MD Initiated Contact with Patient 11/10/14 2202     Chief Complaint  Patient presents with  . Nasal Congestion  . Nausea  . Rash  . Multiple Complaints      (Consider location/radiation/quality/duration/timing/severity/associated sxs/prior Treatment) HPI Comments: Patient complaining of nasal congestion and sore throat cough and URI symptoms 3 days. No fever or chills. Had emesis 1 yesterday. Denies any abdominal or chest pain. No diarrhea. Notes diffuse pruritic rash that began after she was gardening. She has been using calamine lotion for this. Denies any photophobia. No neck pain. No urinary symptoms. Has used Tylenol without relief for URI symptoms.  Patient is a 54 y.o. female presenting with rash. The history is provided by the patient.  Rash   Past Medical History  Diagnosis Date  . SIRS (systemic inflammatory response syndrome)   . Hypokalemia   . Tobacco abuse   . Cocaine addiction   . Cancer     uterine  . Nephrolithiasis   . Pyelonephritis   . UTI (urinary tract infection) 06/2013   Past Surgical History  Procedure Laterality Date  . Renal artery stent      stones  . Tubal ligation    . Vaginal hysterectomy     No family history on file. History  Substance Use Topics  . Smoking status: Current Every Day Smoker -- 1.00 packs/day    Types: Cigarettes  . Smokeless tobacco: Never Used  . Alcohol Use: Yes     Comment: occasionally   OB History    No data available     Review of Systems  Skin: Positive for rash.  All other systems reviewed and are negative.     Allergies  Darvocet; Ibuprofen; and Tramadol  Home Medications   Prior to Admission medications   Medication Sig Start Date End Date Taking? Authorizing Provider  cephALEXin (KEFLEX) 500 MG capsule Take 500 mg by mouth 3 (three) times daily. For 2 days   Yes Historical Provider, MD  oxyCODONE-acetaminophen (PERCOCET)  7.5-325 MG per tablet Take 1 tablet by mouth every 4 (four) hours as needed for moderate pain or severe pain.   Yes Historical Provider, MD  esomeprazole (NEXIUM) 40 MG capsule Take 1 capsule (40 mg total) by mouth daily. Patient not taking: Reported on 10/22/2014 06/18/14   Francine Graven, DO  HYDROcodone-acetaminophen (NORCO/VICODIN) 5-325 MG per tablet 1 or 2 tabs PO q6 hours prn pain Patient not taking: Reported on 10/22/2014 06/18/14   Francine Graven, DO  ondansetron (ZOFRAN) 4 MG tablet Take 1 tablet (4 mg total) by mouth every 8 (eight) hours as needed for nausea or vomiting. Patient not taking: Reported on 10/22/2014 06/18/14   Francine Graven, DO  ondansetron (ZOFRAN-ODT) 4 MG disintegrating tablet Take 1 tablet (4 mg total) by mouth every 8 (eight) hours as needed for nausea or vomiting. Patient not taking: Reported on 04/02/2014 01/16/14   Junius Creamer, NP   BP 114/69 mmHg  Pulse 84  Temp(Src) 97.7 F (36.5 C) (Oral)  Resp 20  SpO2 98% Physical Exam  Constitutional: She is oriented to person, place, and time. She appears well-developed and well-nourished.  Non-toxic appearance. No distress.  HENT:  Head: Normocephalic and atraumatic.  Mouth/Throat: Uvula is midline, oropharynx is clear and moist and mucous membranes are normal.  Eyes: Conjunctivae, EOM and lids are normal. Pupils are equal, round, and reactive to light.  Neck: Normal  range of motion. Neck supple. No tracheal deviation present. No Brudzinski's sign and no Kernig's sign noted. No thyroid mass present.  Cardiovascular: Normal rate, regular rhythm and normal heart sounds.  Exam reveals no gallop.   No murmur heard. Pulmonary/Chest: Effort normal and breath sounds normal. No stridor. No respiratory distress. She has no decreased breath sounds. She has no wheezes. She has no rhonchi. She has no rales.  Abdominal: Soft. Normal appearance and bowel sounds are normal. She exhibits no distension. There is no tenderness. There is  no rebound and no CVA tenderness.  Musculoskeletal: Normal range of motion. She exhibits no edema or tenderness.  Neurological: She is alert and oriented to person, place, and time. She has normal strength. No cranial nerve deficit or sensory deficit. GCS eye subscore is 4. GCS verbal subscore is 5. GCS motor subscore is 6.  Skin: Skin is warm and dry. No abrasion and no rash noted.  Diffuse papular rash without petechiae. Slight erythematous base. Nonvesicular  Psychiatric: She has a normal mood and affect. Her speech is normal and behavior is normal.  Nursing note and vitals reviewed.   ED Course  Procedures (including critical care time) Labs Review Labs Reviewed - No data to display  Imaging Review No results found.   EKG Interpretation None      MDM   Final diagnoses:  None    Patient's rash likely from poison ivy exposure. Will place on prednisone. Patient given prescription for a Z-Pak and told to only take it if her cough gets worse    Lacretia Leigh, MD 11/10/14 2218

## 2014-11-10 NOTE — Discharge Instructions (Signed)
Only start antibiotics if your cough gets worse in the next 1-2 days Poison [facility] ivy is a inflammation of the skin (contact dermatitis) caused by touching the allergens on the leaves of the ivy plant following previous exposure to the plant. The rash usually appears 48 hours after exposure. The rash is usually bumps (papules) or blisters (vesicles) in a linear pattern. Depending on your own sensitivity, the rash may simply cause redness and itching, or it may also progress to blisters which may break open. These must be well cared for to prevent secondary bacterial (germ) infection, followed by scarring. Keep any open areas dry, clean, dressed, and covered with an antibacterial ointment if needed. The eyes may also get puffy. The puffiness is worst in the morning and gets better as the day progresses. This dermatitis usually heals without scarring, within 2 to 3 weeks without treatment. HOME CARE INSTRUCTIONS  Thoroughly wash with soap and water as soon as you have been exposed to poison ivy. You have about one half hour to remove the plant resin before it will cause the rash. This washing will destroy the oil or antigen on the skin that is causing, or will cause, the rash. Be sure to wash under your fingernails as any plant resin there will continue to spread the rash. Do not rub skin vigorously when washing affected area. Poison ivy cannot spread if no oil from the plant remains on your body. A rash that has progressed to weeping sores will not spread the rash unless you have not washed thoroughly. It is also important to wash any clothes you have been wearing as these may carry active allergens. The rash will return if you wear the unwashed clothing, even several days later. Avoidance of the plant in the future is the best measure. Poison ivy plant can be recognized by the number of leaves. Generally, poison ivy has three leaves with flowering branches on a single stem. Diphenhydramine may be  purchased over the counter and used as needed for itching. Do not drive with this medication if it makes you drowsy.Ask your caregiver about medication for children. SEEK MEDICAL CARE IF:  Open sores develop.  Redness spreads beyond area of rash.  You notice purulent (pus-like) discharge.  You have increased pain.  Other signs of infection develop (such as fever). Document Released: 04/06/2000 Document Revised: 07/02/2011 Document Reviewed: 09/17/2008 [facility] Patient Information 2015 [place], [place]. This information is not intended to replace advice given to you by your health care provider. Make sure you discuss any questions you have with your health care provider.

## 2014-11-10 NOTE — ED Notes (Addendum)
Pt complains of nasal congestion, sore throat, cough, ear pain, nausea and rash to hands, legs and stomach. Pt states all of her symptoms started 1 week ago. Pt denies abdominal pain, diarrhea. Pt states she threw up yesterday. Pt states she feels body aches and weakness all over.

## 2015-03-04 ENCOUNTER — Emergency Department (HOSPITAL_COMMUNITY): Payer: No Typology Code available for payment source

## 2015-03-04 ENCOUNTER — Emergency Department (HOSPITAL_COMMUNITY)
Admission: EM | Admit: 2015-03-04 | Discharge: 2015-03-04 | Disposition: A | Discharge status: Home / Self Care | Payer: No Typology Code available for payment source | Attending: Emergency Medicine | Admitting: Emergency Medicine

## 2015-03-04 ENCOUNTER — Encounter (HOSPITAL_COMMUNITY): Payer: Self-pay

## 2015-03-04 DIAGNOSIS — Z8679 Personal history of other diseases of the circulatory system: Secondary | ICD-10-CM | POA: Insufficient documentation

## 2015-03-04 DIAGNOSIS — Z720 Tobacco use: Secondary | ICD-10-CM | POA: Insufficient documentation

## 2015-03-04 DIAGNOSIS — Z792 Long term (current) use of antibiotics: Secondary | ICD-10-CM | POA: Insufficient documentation

## 2015-03-04 DIAGNOSIS — Z8639 Personal history of other endocrine, nutritional and metabolic disease: Secondary | ICD-10-CM | POA: Insufficient documentation

## 2015-03-04 DIAGNOSIS — K219 Gastro-esophageal reflux disease without esophagitis: Secondary | ICD-10-CM | POA: Insufficient documentation

## 2015-03-04 DIAGNOSIS — N390 Urinary tract infection, site not specified: Secondary | ICD-10-CM | POA: Insufficient documentation

## 2015-03-04 DIAGNOSIS — Z87448 Personal history of other diseases of urinary system: Secondary | ICD-10-CM | POA: Insufficient documentation

## 2015-03-04 DIAGNOSIS — Z7952 Long term (current) use of systemic steroids: Secondary | ICD-10-CM | POA: Insufficient documentation

## 2015-03-04 DIAGNOSIS — J069 Acute upper respiratory infection, unspecified: Secondary | ICD-10-CM | POA: Insufficient documentation

## 2015-03-04 LAB — CBC
HEMATOCRIT: 40.5 % (ref 36.0–46.0)
HEMOGLOBIN: 13.5 g/dL (ref 12.0–15.0)
MCH: 31.8 pg (ref 26.0–34.0)
MCHC: 33.3 g/dL (ref 30.0–36.0)
MCV: 95.3 fL (ref 78.0–100.0)
Platelets: 307 10*3/uL (ref 150–400)
RBC: 4.25 MIL/uL (ref 3.87–5.11)
RDW: 13 % (ref 11.5–15.5)
WBC: 10.4 10*3/uL (ref 4.0–10.5)

## 2015-03-04 LAB — URINALYSIS, ROUTINE W REFLEX MICROSCOPIC
Bilirubin Urine: NEGATIVE
Glucose, UA: NEGATIVE mg/dL
Ketones, ur: NEGATIVE mg/dL
NITRITE: POSITIVE — AB
PH: 6 (ref 5.0–8.0)
Protein, ur: NEGATIVE mg/dL
Specific Gravity, Urine: 1.024 (ref 1.005–1.030)
UROBILINOGEN UA: 1 mg/dL (ref 0.0–1.0)

## 2015-03-04 LAB — COMPREHENSIVE METABOLIC PANEL
ALBUMIN: 3.6 g/dL (ref 3.5–5.0)
ALT: 23 U/L (ref 14–54)
ANION GAP: 7 (ref 5–15)
AST: 24 U/L (ref 15–41)
Alkaline Phosphatase: 90 U/L (ref 38–126)
BILIRUBIN TOTAL: 0.2 mg/dL — AB (ref 0.3–1.2)
BUN: 14 mg/dL (ref 6–20)
CHLORIDE: 107 mmol/L (ref 101–111)
CO2: 25 mmol/L (ref 22–32)
Calcium: 9.4 mg/dL (ref 8.9–10.3)
Creatinine, Ser: 0.91 mg/dL (ref 0.44–1.00)
GFR calc Af Amer: >60 mL/min (ref >60)
GFR calc non Af Amer: >60 mL/min (ref >60)
GLUCOSE: 124 mg/dL — AB (ref 65–99)
POTASSIUM: 3.8 mmol/L (ref 3.5–5.1)
Sodium: 139 mmol/L (ref 135–145)
TOTAL PROTEIN: 6.9 g/dL (ref 6.5–8.1)

## 2015-03-04 LAB — URINE MICROSCOPIC-ADD ON

## 2015-03-04 LAB — LIPASE, BLOOD: Lipase: 37 U/L (ref 11–51)

## 2015-03-04 LAB — I-STAT TROPONIN, ED: TROPONIN I, POC: 0 ng/mL (ref 0.00–0.08)

## 2015-03-04 MED ORDER — ONDANSETRON 4 MG PO TBDP
4.0000 mg | ORAL_TABLET | Freq: Once | ORAL | Status: AC | PRN
Start: 2015-03-04 — End: 2015-03-04
  Administered 2015-03-04: 4 mg via ORAL

## 2015-03-04 MED ORDER — ONDANSETRON 4 MG PO TBDP
ORAL_TABLET | ORAL | Status: AC
  Filled 2015-03-04: qty 1

## 2015-03-04 MED ORDER — OMEPRAZOLE 20 MG PO CPDR: 20.0000 mg | DELAYED_RELEASE_CAPSULE | Freq: Every day | ORAL | Status: DC

## 2015-03-04 MED ORDER — CEPHALEXIN 500 MG PO CAPS: 500.0000 mg | ORAL_CAPSULE | Freq: Two times a day (BID) | ORAL | Status: DC

## 2015-03-04 NOTE — ED Notes (Signed)
PA made aware of patient's pain. No new orders at this time

## 2015-03-04 NOTE — ED Notes (Signed)
Pt. Reports having abdominal pain that radiates into her chest began 4 days ago. She is nauseated and vomiting.  She is also having loose stool and headaches.  Chills and fevers also.  Skin is warm pink and dry.  Productive cough, yellow sputum

## 2015-03-04 NOTE — Discharge Instructions (Signed)
- Go to follow-up appointment at [facility] as discussed with care management - Take keflex for urinary tract infection - Take omeprazole daily for GERD - Rest, increase fluids and use over-the-counter pain relievers and symptomatic care for your upper respiratory viral infection   Gastroesophageal Reflux Disease, Adult Normally, food travels down the esophagus and stays in the stomach to be digested. However, when a person has gastroesophageal reflux disease (GERD), food and stomach acid move back up into the esophagus. When this happens, the esophagus becomes sore and inflamed. Over time, GERD can create small holes (ulcers) in the lining of the esophagus.  CAUSES This condition is caused by a problem with the muscle between the esophagus and the stomach (lower esophageal sphincter, or LES). Normally, the LES muscle closes after food passes through the esophagus to the stomach. When the LES is weakened or abnormal, it does not close properly, and that allows food and stomach acid to go back up into the esophagus. The LES can be weakened by certain dietary substances, medicines, and medical conditions, including:  Tobacco use.  Pregnancy.  Having a hiatal hernia.  Heavy alcohol use.  Certain foods and beverages, such as coffee, chocolate, onions, and peppermint. RISK FACTORS This condition is more likely to develop in:  People who have an increased body weight.  People who have connective tissue disorders.  People who use NSAID medicines. SYMPTOMS Symptoms of this condition include:  Heartburn.  Difficult or painful swallowing.  The feeling of having a lump in the throat.  Abitter taste in the mouth.  Bad breath.  Having a large amount of saliva.  Having an upset or bloated stomach.  Belching.  Chest pain.  Shortness of breath or wheezing.  Ongoing (chronic) cough or a night-time cough.  Wearing away of tooth enamel.  Weight loss. Different  conditions can cause chest pain. Make sure to see your health care provider if you experience chest pain. DIAGNOSIS Your health care provider will take a medical history and perform a physical exam. To determine if you have mild or severe GERD, your health care provider may also monitor how you respond to treatment. You may also have other tests, including:  An endoscopy toexamine your stomach and esophagus with a small camera.  A test thatmeasures the acidity level in your esophagus.  A test thatmeasures how much pressure is on your esophagus.  A barium swallow or modified barium swallow to show the shape, size, and functioning of your esophagus. TREATMENT The goal of treatment is to help relieve your symptoms and to prevent complications. Treatment for this condition may vary depending on how severe your symptoms are. Your health care provider may recommend:  Changes to your diet.  Medicine.  Surgery. HOME CARE INSTRUCTIONS Diet  Follow a diet as recommended by your health care provider. This may involve avoiding foods and drinks such as:  Coffee and tea (with or without caffeine).  Drinks that containalcohol.  Energy drinks and sports drinks.  Carbonated drinks or sodas.  Chocolate and cocoa.  Peppermint and mint flavorings.  Garlic and onions.  Horseradish.  Spicy and acidic foods, including peppers, chili powder, curry powder, vinegar, hot sauces, and barbecue sauce.  Citrus fruit juices and citrus fruits, such as oranges, lemons, and limes.  Tomato-based foods, such as red sauce, chili, salsa, and pizza with red sauce.  Fried and fatty foods, such as donuts, french fries, potato chips, and high-fat dressings.  High-fat meats, such as hot  dogs and fatty cuts of red and white meats, such as rib eye steak, sausage, ham, and bacon.  High-fat dairy items, such as whole milk, butter, and cream cheese.  Eat small, frequent meals instead of large meals.  Avoid  drinking large amounts of liquid with your meals.  Avoid eating meals during the 2-3 hours before bedtime.  Avoid lying down right after you eat.  Do not exercise right after you eat. General Instructions  Pay attention to any changes in your symptoms.  Take over-the-counter and prescription medicines only as told by your health care provider. Do not take aspirin, ibuprofen, or other NSAIDs unless your health care provider told you to do so.  Do not use any tobacco products, including cigarettes, chewing tobacco, and e-cigarettes. If you need help quitting, ask your health care provider.  Wear loose-fitting clothing. Do not wear anything tight around your waist that causes pressure on your abdomen.  Raise (elevate) the head of your bed 6 inches (15cm).  Try to reduce your stress, such as with yoga or meditation. If you need help reducing stress, ask your health care provider.  If you are overweight, reduce your weight to an amount that is healthy for you. Ask your health care provider for guidance about a safe weight loss goal.  Keep all follow-up visits as told by your health care provider. This is important. SEEK MEDICAL CARE IF:  You have new symptoms.  You have unexplained weight loss.  You have difficulty swallowing, or it hurts to swallow.  You have wheezing or a persistent cough.  Your symptoms do not improve with treatment.  You have a hoarse voice. SEEK IMMEDIATE MEDICAL CARE IF:  You have pain in your arms, neck, jaw, teeth, or back.  You feel sweaty, dizzy, or light-headed.  You have chest pain or shortness of breath.  You vomit and your vomit looks like blood or coffee grounds.  You faint.  Your stool is bloody or black.  You cannot swallow, drink, or eat.   This information is not intended to replace advice given to you by your health care provider. Make sure you discuss any questions you have with your health care provider.   Document Released:  01/17/2005 Document Revised: 12/29/2014 Document Reviewed: 08/04/2014 Elsevier Interactive Patient Education 2016 [place] for Gastroesophageal Reflux Disease, Adult When you have gastroesophageal reflux disease (GERD), the foods you eat and your eating habits are very important. Choosing the right foods can help ease the discomfort of GERD. WHAT GENERAL GUIDELINES DO I NEED TO FOLLOW?  Choose fruits, vegetables, whole grains, low-fat dairy products, and low-fat meat, fish, and poultry.  Limit fats such as oils, salad dressings, butter, nuts, and avocado.  Keep a food diary to identify foods that cause symptoms.  Avoid foods that cause reflux. These may be different for different people.  Eat frequent small meals instead of three large meals each day.  Eat your meals slowly, in a relaxed setting.  Limit fried foods.  Cook foods using methods other than frying.  Avoid drinking alcohol.  Avoid drinking large amounts of liquids with your meals.  Avoid bending over or lying down until 2-3 hours after eating. WHAT FOODS ARE NOT RECOMMENDED? The following are some foods and drinks that may worsen your symptoms: Vegetables Tomatoes. Tomato juice. Tomato and spaghetti sauce. Chili peppers. Onion and garlic. Horseradish. Fruits Oranges, grapefruit, and lemon (fruit and juice). Meats High-fat meats, fish, and poultry. This includes hot  dogs, ribs, ham, sausage, salami, and bacon. Dairy Whole milk and chocolate milk. Sour cream. Cream. Butter. Ice cream. Cream cheese.  Beverages Coffee and tea, with or without caffeine. Carbonated beverages or energy drinks. Condiments Hot sauce. Barbecue sauce.  Sweets/Desserts Chocolate and cocoa. Donuts. Peppermint and spearmint. Fats and Oils High-fat foods, including [place] fries and potato chips. Other Vinegar. Strong spices, such as black pepper, white pepper, red pepper, cayenne, curry powder, cloves, ginger, and chili  powder. The items listed above may not be a complete list of foods and beverages to avoid. Contact your dietitian for more information.   This information is not intended to replace advice given to you by your health care provider. Make sure you discuss any questions you have with your health care provider.   Document Released: 04/09/2005 Document Revised: 04/30/2014 Document Reviewed: 02/11/2013 Elsevier Interactive Patient Education 2016 [employer]  Upper Respiratory Infection, Adult Most upper respiratory infections (URIs) are a viral infection of the air passages leading to the lungs. A URI affects the nose, throat, and upper air passages. The most common type of URI is nasopharyngitis and is typically referred to as "the common cold." URIs run their course and usually go away on their own. Most of the time, a URI does not require medical attention, but sometimes a bacterial infection in the upper airways can follow a viral infection. This is called a secondary infection. Sinus and middle ear infections are common types of secondary upper respiratory infections. Bacterial pneumonia can also complicate a URI. A URI can worsen asthma and chronic obstructive pulmonary disease (COPD). Sometimes, these complications can require emergency medical care and may be life threatening.  CAUSES Almost all URIs are caused by viruses. A virus is a type of germ and can spread from one person to another.  RISKS FACTORS You may be at risk for a URI if:   You smoke.   You have chronic heart or lung disease.  You have a weakened defense (immune) system.   You are very young or very old.   You have nasal allergies or asthma.  You work in crowded or poorly ventilated areas.  You work in health care facilities or schools. SIGNS AND SYMPTOMS  Symptoms typically develop 2-3 days after you come in contact with a cold virus. Most viral URIs last 7-10 days. However, viral URIs from the influenza virus  (flu virus) can last 14-18 days and are typically more severe. Symptoms may include:   Runny or stuffy (congested) nose.   Sneezing.   Cough.   Sore throat.   Headache.   Fatigue.   Fever.   Loss of appetite.   Pain in your forehead, behind your eyes, and over your cheekbones (sinus pain).  Muscle aches.  DIAGNOSIS  Your health care provider may diagnose a URI by:  Physical exam.  Tests to check that your symptoms are not due to another condition such as:  Strep throat.  Sinusitis.  Pneumonia.  Asthma. TREATMENT  A URI goes away on its own with time. It cannot be cured with medicines, but medicines may be prescribed or recommended to relieve symptoms. Medicines may help:  Reduce your fever.  Reduce your cough.  Relieve nasal congestion. HOME CARE INSTRUCTIONS   Take medicines only as directed by your health care provider.   Gargle warm saltwater or take cough drops to comfort your throat as directed by your health care provider.  Use a warm mist humidifier or inhale steam from  a shower to increase air moisture. This may make it easier to breathe.  Drink enough fluid to keep your urine clear or pale yellow.   Eat soups and other clear broths and maintain good nutrition.   Rest as needed.   Return to work when your temperature has returned to normal or as your health care provider advises. You may need to stay home longer to avoid infecting others. You can also use a face mask and careful hand washing to prevent spread of the virus.  Increase the usage of your inhaler if you have asthma.   Do not use any tobacco products, including cigarettes, chewing tobacco, or electronic cigarettes. If you need help quitting, ask your health care provider. PREVENTION  The best way to protect yourself from getting a cold is to practice good hygiene.   Avoid oral or hand contact with people with cold symptoms.   Wash your hands often if contact occurs.   There is no clear evidence that vitamin C, vitamin E, echinacea, or exercise reduces the chance of developing a cold. However, it is always recommended to get plenty of rest, exercise, and practice good nutrition.  SEEK MEDICAL CARE IF:   You are getting worse rather than better.   Your symptoms are not controlled by medicine.   You have chills.  You have worsening shortness of breath.  You have brown or red mucus.  You have yellow or brown nasal discharge.  You have pain in your face, especially when you bend forward.  You have a fever.  You have swollen neck glands.  You have pain while swallowing.  You have white areas in the back of your throat. SEEK IMMEDIATE MEDICAL CARE IF:   You have severe or persistent:  Headache.  Ear pain.  Sinus pain.  Chest pain.  You have chronic lung disease and any of the following:  Wheezing.  Prolonged cough.  Coughing up blood.  A change in your usual mucus.  You have a stiff neck.  You have changes in your:  Vision.  Hearing.  Thinking.  Mood. MAKE SURE YOU:   Understand these instructions.  Will watch your condition.  Will get help right away if you are not doing well or get worse.   This information is not intended to replace advice given to you by your health care provider. Make sure you discuss any questions you have with your health care provider.   Document Released: 10/03/2000 Document Revised: 08/24/2014 Document Reviewed: 07/15/2013 Elsevier Interactive Patient Education 2016 [employer]  Urinary Tract Infection Urinary tract infections (UTIs) can develop anywhere along your urinary tract. Your urinary tract is your body's drainage system for removing wastes and extra water. Your urinary tract includes two kidneys, two ureters, a bladder, and a urethra. Your kidneys are a pair of bean-shaped organs. Each kidney is about the size of your fist. They are located below your ribs, one on each side  of your spine. CAUSES Infections are caused by microbes, which are microscopic organisms, including fungi, viruses, and bacteria. These organisms are so small that they can only be seen through a microscope. Bacteria are the microbes that most commonly cause UTIs. SYMPTOMS  Symptoms of UTIs may vary by age and gender of the patient and by the location of the infection. Symptoms in young women typically include a frequent and intense urge to urinate and a painful, burning feeling in the bladder or urethra during urination. Older women and men are more  likely to be tired, shaky, and weak and have muscle aches and abdominal pain. A fever may mean the infection is in your kidneys. Other symptoms of a kidney infection include pain in your back or sides below the ribs, nausea, and vomiting. DIAGNOSIS To diagnose a UTI, your caregiver will ask you about your symptoms. Your caregiver will also ask you to provide a urine sample. The urine sample will be tested for bacteria and white blood cells. White blood cells are made by your body to help fight infection. TREATMENT  Typically, UTIs can be treated with medication. Because most UTIs are caused by a bacterial infection, they usually can be treated with the use of antibiotics. The choice of antibiotic and length of treatment depend on your symptoms and the type of bacteria causing your infection. HOME CARE INSTRUCTIONS  If you were prescribed antibiotics, take them exactly as your caregiver instructs you. Finish the medication even if you feel better after you have only taken some of the medication.  Drink enough water and fluids to keep your urine clear or pale yellow.  Avoid caffeine, tea, and carbonated beverages. They tend to irritate your bladder.  Empty your bladder often. Avoid holding urine for long periods of time.  Empty your bladder before and after sexual intercourse.  After a bowel movement, women should cleanse from front to back. Use each  tissue only once. SEEK MEDICAL CARE IF:   You have back pain.  You develop a fever.  Your symptoms do not begin to resolve within 3 days. SEEK IMMEDIATE MEDICAL CARE IF:   You have severe back pain or lower abdominal pain.  You develop chills.  You have nausea or vomiting.  You have continued burning or discomfort with urination. MAKE SURE YOU:   Understand these instructions.  Will watch your condition.  Will get help right away if you are not doing well or get worse.   This information is not intended to replace advice given to you by your health care provider. Make sure you discuss any questions you have with your health care provider.   Document Released: 01/17/2005 Document Revised: 12/29/2014 Document Reviewed: 05/18/2011 Elsevier Interactive Patient Education [employer].

## 2015-03-04 NOTE — ED Provider Notes (Signed)
CSN: [id number]     Arrival date & time 03/04/15  1347 History   First MD Initiated Contact with Patient 03/04/15 1722     Chief Complaint  Patient presents with  . Abdominal Pain   HPI  Kelli Jenkins is a 54 year old female presenting with abdominal pain, nausea, vomiting and cough. Patient reports abdominal pain began approximately 4 days ago. She states that it is epigastric and burning. She reports a history of GERD and states that these symptoms feel similar. She reports that with certain foods she notices the pain increases and radiates into her chest. Certain foods such as tomatoes and orange juice specifically caused the pain to increase. She states that she has tried over-the-counter Rolaids and Zantac for her pain which has not helped. She also endorses nausea and vomiting with the pain. She states that when she eats something that "doesn't agree with my stomach" she immediately vomits afterwards. She is not currently experiencing the pain. She is also complaining of 4 days of cough productive of yellow sputum. She is a smoker and states that this is different than her typical cough. She denies associated fevers, chills, congestion, rhinorrhea, ear pain, eye discharge or sore throat. She states that she has a long history of migraines and so she "always has a headache ". She denies sick contacts. She denies recent travel. She denies having a primary care provider.  Past Medical History  Diagnosis Date  . SIRS (systemic inflammatory response syndrome) (HCC)   . Hypokalemia   . Tobacco abuse   . Cocaine addiction ([place])   . Cancer ([place])     uterine  . Nephrolithiasis   . Pyelonephritis   . UTI (urinary tract infection) 06/2013   Past Surgical History  Procedure Laterality Date  . Renal artery stent      stones  . Tubal ligation    . Vaginal hysterectomy     No family history on file. Social History  Substance Use Topics  . Smoking status: Current Every Day Smoker -- 1.00 packs/day     Types: Cigarettes  . Smokeless tobacco: Never Used  . Alcohol Use: Yes     Comment: occasionally   OB History    No data available     Review of Systems  Constitutional: Negative for fever, chills and diaphoresis.  HENT: Negative for congestion, ear pain, rhinorrhea and sore throat.   Eyes: Negative for discharge and redness.  Respiratory: Positive for cough. Negative for chest tightness, shortness of breath and wheezing.   Cardiovascular: Positive for chest pain. Negative for palpitations.  Gastrointestinal: Positive for nausea, vomiting and abdominal pain.  Genitourinary: Negative for dysuria, hematuria and flank pain.  Musculoskeletal: Negative for myalgias, arthralgias and neck pain.  Skin: Negative for rash.  Neurological: Positive for headaches (chronic). Negative for dizziness and syncope.  All other systems reviewed and are negative.     Allergies  Darvocet; Ibuprofen; and Tramadol  Home Medications   Prior to Admission medications   Medication Sig Start Date End Date Taking? Authorizing Provider  ranitidine (ZANTAC) 150 MG tablet Take 150 mg by mouth 2 (two) times daily.   Yes Historical Provider, MD  azithromycin (ZITHROMAX) 250 MG tablet 2 by mouth daily 1, then 1 by mouth daily 4 days 11/10/14   Lacretia Leigh, MD  cephALEXin (KEFLEX) 500 MG capsule Take 1 capsule (500 mg total) by mouth 2 (two) times daily. 03/04/15   Lenin Kuhnle, PA-C  esomeprazole (NEXIUM) 40 MG capsule  Take 1 capsule (40 mg total) by mouth daily. Patient not taking: Reported on 10/22/2014 06/18/14   Francine Graven, DO  HYDROcodone-acetaminophen (NORCO/VICODIN) 5-325 MG per tablet 1 or 2 tabs PO q6 hours prn pain Patient not taking: Reported on 10/22/2014 06/18/14   Francine Graven, DO  omeprazole (PRILOSEC) 20 MG capsule Take 1 capsule (20 mg total) by mouth daily. 03/04/15   Ezmae Speers, PA-C  ondansetron (ZOFRAN) 4 MG tablet Take 1 tablet (4 mg total) by mouth every 8 (eight) hours as  needed for nausea or vomiting. Patient not taking: Reported on 10/22/2014 06/18/14   Francine Graven, DO  ondansetron (ZOFRAN-ODT) 4 MG disintegrating tablet Take 1 tablet (4 mg total) by mouth every 8 (eight) hours as needed for nausea or vomiting. Patient not taking: Reported on 04/02/2014 01/16/14   Junius Creamer, NP  predniSONE (DELTASONE) 20 MG tablet Take 1 tablet (20 mg total) by mouth daily. 11/10/14   Lacretia Leigh, MD   BP 139/91 mmHg  Pulse 95  Temp(Src) 98.3 F (36.8 C) (Oral)  Resp 21  Ht 5\' 8"  (1.727 m)  Wt 215 lb 5 oz (97.665 kg)  BMI 32.75 kg/m2  SpO2 96% Physical Exam  Constitutional: She appears well-developed and well-nourished. No distress.  HENT:  Head: Normocephalic and atraumatic.  Nose: Nose normal. No mucosal edema or rhinorrhea.  Mouth/Throat: Oropharynx is clear and moist. No oropharyngeal exudate.  Eyes: Conjunctivae are normal. Right eye exhibits no discharge. Left eye exhibits no discharge. No scleral icterus.  Neck: Normal range of motion.  Cardiovascular: Normal rate, regular rhythm and normal heart sounds.   Pulmonary/Chest: Effort normal and breath sounds normal. No respiratory distress. She has no wheezes. She has no rales.  Patient breathing unlabored. No wheezing, rales or rhonchi auscultated in all lung fields.  Abdominal: Soft. She exhibits no distension. There is no tenderness. There is no rebound and no guarding.  Soft, nontender. No peritoneal signs.  Musculoskeletal: Normal range of motion.  Moves all extremities spontaneously and without pain.  Neurological: She is alert. Coordination normal.  Skin: Skin is warm and dry.  Psychiatric: She has a normal mood and affect. Her behavior is normal.  Nursing note and vitals reviewed.   ED Course  Procedures (including critical care time) Labs Review Labs Reviewed  COMPREHENSIVE METABOLIC PANEL - Abnormal; Notable for the following:    Glucose, Bld 124 (*)    Total Bilirubin 0.2 (*)    All other  components within normal limits  URINALYSIS, ROUTINE W REFLEX MICROSCOPIC (NOT AT [facility]) - Abnormal; Notable for the following:    APPearance CLOUDY (*)    Hgb urine dipstick LARGE (*)    Nitrite POSITIVE (*)    Leukocytes, UA SMALL (*)    All other components within normal limits  URINE MICROSCOPIC-ADD ON - Abnormal; Notable for the following:    Squamous Epithelial / LPF FEW (*)    Bacteria, UA MANY (*)    Crystals CA OXALATE CRYSTALS (*)    All other components within normal limits  LIPASE, BLOOD  CBC  I-STAT TROPOININ, ED    Imaging Review Dg Chest 2 View  03/04/2015  CLINICAL DATA:  Cough productive of green/ yellow sputum for 2 months, shortness of breath for over 1 week, mid chest pain extending to RIGHT-side for 2 weeks, smoker EXAM: CHEST  2 VIEW COMPARISON:  10/22/2014, 06/18/2014 FINDINGS: Normal heart size, mediastinal contours, and pulmonary vascularity. Minimal central peribronchial thickening, chronic. No acute infiltrate, pleural effusion  or pneumothorax. Bones demineralized. IMPRESSION: Minimal chronic bronchitic changes without infiltrate. Electronically Signed   By: Lavonia Dana M.D.   On: 03/04/2015 15:38   I have personally reviewed and evaluated these images and lab results as part of my medical decision-making.   EKG Interpretation None      MDM   Final diagnoses:  Gastroesophageal reflux disease, esophagitis presence not specified  URI (upper respiratory infection)  UTI (lower urinary tract infection)   Patient presenting with abdominal pain and cough. Patient's abdominal pain is described as epigastric burning which radiates upwards into the chest. Pain is exacerbated by certain foods. Patient states that she has had this pain before with her previous GERD symptoms. She states that her cough is productive of yellow sputum which is different than her smoker's cough. Denies other associated symptoms. Vital signs stable. Patient is nontoxic-appearing. Lungs are  clear auscultation bilaterally. Abdomen is soft, nontender. She reports that she is not currently spends the pain that she has not eaten any of the aggravating foods today. Benign exam. Blood work reassuring. Urine positive for UTI. Chest x-ray negative for infiltrates. Patient likely having flare of her chronic GERD and a viral upper respiratory infection. Patient does not have a PCP and was set up with a follow-up appointment at the [facility] by case management. Patient is upset we are not giving her an antibiotic for her respiratory infection. Discussed with patient that antibiotics are not indicated for viral infections. Will discharge with omeprazole and Keflex for her UTI.  At this time there does not appear to be any evidence of an acute emergency medical condition and the patient appears stable for discharge with appropriate outpatient follow up. Diagnosis was discussed with patient who verbalizes understanding and is agreeable to discharge. Return precautions given in discharge paperwork and discussed with pt at bedside. Pt is stable for discharge.      Lahoma Crocker Desia Saban, PA-C 03/05/15 0022  Davonna Belling, MD 03/07/15 1155

## 2015-03-04 NOTE — ED Notes (Signed)
PA at the bedside.

## 2015-03-14 ENCOUNTER — Ambulatory Visit: Payer: No Typology Code available for payment source | Admitting: Family Medicine

## 2015-03-16 ENCOUNTER — Encounter (HOSPITAL_COMMUNITY): Payer: Self-pay

## 2015-03-16 ENCOUNTER — Emergency Department (HOSPITAL_COMMUNITY)
Admission: EM | Admit: 2015-03-16 | Discharge: 2015-03-16 | Discharge status: Against Medical Advice | Payer: No Typology Code available for payment source | Attending: Emergency Medicine | Admitting: Emergency Medicine

## 2015-03-16 DIAGNOSIS — M549 Dorsalgia, unspecified: Secondary | ICD-10-CM | POA: Insufficient documentation

## 2015-03-16 DIAGNOSIS — F17210 Nicotine dependence, cigarettes, uncomplicated: Secondary | ICD-10-CM | POA: Insufficient documentation

## 2015-03-16 NOTE — ED Notes (Signed)
Pt complains of right sided back pain for three days, no injury noted

## 2015-03-16 NOTE — ED Notes (Signed)
Pt not in room at this time, apparently left ED without notice.

## 2015-03-21 ENCOUNTER — Encounter (HOSPITAL_COMMUNITY): Payer: Self-pay | Admitting: Emergency Medicine

## 2015-03-21 ENCOUNTER — Emergency Department (HOSPITAL_COMMUNITY)
Admission: EM | Admit: 2015-03-21 | Discharge: 2015-03-22 | Disposition: A | Discharge status: Home / Self Care | Payer: No Typology Code available for payment source | Attending: Emergency Medicine | Admitting: Emergency Medicine

## 2015-03-21 DIAGNOSIS — Z8542 Personal history of malignant neoplasm of other parts of uterus: Secondary | ICD-10-CM | POA: Insufficient documentation

## 2015-03-21 DIAGNOSIS — Z87442 Personal history of urinary calculi: Secondary | ICD-10-CM | POA: Insufficient documentation

## 2015-03-21 DIAGNOSIS — N390 Urinary tract infection, site not specified: Secondary | ICD-10-CM

## 2015-03-21 DIAGNOSIS — R109 Unspecified abdominal pain: Secondary | ICD-10-CM

## 2015-03-21 DIAGNOSIS — Z87440 Personal history of urinary (tract) infections: Secondary | ICD-10-CM | POA: Insufficient documentation

## 2015-03-21 DIAGNOSIS — F17210 Nicotine dependence, cigarettes, uncomplicated: Secondary | ICD-10-CM | POA: Insufficient documentation

## 2015-03-21 DIAGNOSIS — Z79899 Other long term (current) drug therapy: Secondary | ICD-10-CM | POA: Insufficient documentation

## 2015-03-21 DIAGNOSIS — Z87448 Personal history of other diseases of urinary system: Secondary | ICD-10-CM | POA: Insufficient documentation

## 2015-03-21 DIAGNOSIS — Z8639 Personal history of other endocrine, nutritional and metabolic disease: Secondary | ICD-10-CM | POA: Insufficient documentation

## 2015-03-21 LAB — URINALYSIS, ROUTINE W REFLEX MICROSCOPIC
Bilirubin Urine: NEGATIVE
GLUCOSE, UA: NEGATIVE mg/dL
KETONES UR: NEGATIVE mg/dL
NITRITE: POSITIVE — AB
PROTEIN: NEGATIVE mg/dL
Specific Gravity, Urine: 1.018 (ref 1.005–1.030)
pH: 6.5 (ref 5.0–8.0)

## 2015-03-21 LAB — URINE MICROSCOPIC-ADD ON

## 2015-03-21 NOTE — ED Notes (Signed)
Patient presents from home via EMS for back pain x5 days. Lower back pain radiating down right leg. Also c/o lower abdominal pain.   Last VS: 128/78, 98hr, 94%ra.

## 2015-03-21 NOTE — ED Notes (Addendum)
Patient c/o lower back pain x5 days radiating to right leg, also endorses urinary frequency and nausea. Last BM x2 days. Rates pain 10/10.

## 2015-03-22 MED ORDER — FENTANYL CITRATE (PF) 100 MCG/2ML IJ SOLN
50.0000 ug | Freq: Once | INTRAMUSCULAR | Status: AC
  Administered 2015-03-22: 50 ug via INTRAMUSCULAR
  Filled 2015-03-22: qty 2

## 2015-03-22 MED ORDER — STERILE WATER FOR INJECTION IJ SOLN
INTRAMUSCULAR | Status: AC
  Administered 2015-03-22: 2.1 mL
  Filled 2015-03-22: qty 10

## 2015-03-22 MED ORDER — CEPHALEXIN 500 MG PO CAPS: 500.0000 mg | ORAL_CAPSULE | Freq: Two times a day (BID) | ORAL | Status: DC

## 2015-03-22 MED ORDER — HYDROCODONE-ACETAMINOPHEN 5-325 MG PO TABS: ORAL_TABLET | ORAL | Status: DC

## 2015-03-22 MED ORDER — CEFTRIAXONE SODIUM 1 G IJ SOLR
1.0000 g | Freq: Once | INTRAMUSCULAR | Status: AC
  Administered 2015-03-22: 1 g via INTRAMUSCULAR
  Filled 2015-03-22: qty 10

## 2015-03-22 NOTE — ED Provider Notes (Signed)
CSN: [id number]     Arrival date & time 03/21/15  2040 History   First MD Initiated Contact with Patient 03/22/15 0056     No chief complaint on file.    (Consider location/radiation/quality/duration/timing/severity/associated sxs/prior Treatment) HPI Comments: 54 year old female with a history of tobacco abuse, cocaine addiction, uterine cancer, and pyelonephritis presents to the emergency department for complaints of back pain. She states the pain began 3-4 days ago. The pain has been constant and worsening. She has not taken any medications for her pain. She reports associated nausea and vomiting. Vomiting has been sporadic. She states that she has been experiencing dysuria as well as urinary frequency. Patient was diagnosed with a UTI on 03/15/2015. She reports that she took her full course of antibiotics. No fever or hematuria. No bowel/bladder incontinence.  The history is provided by the patient. No language interpreter was used.    Past Medical History  Diagnosis Date  . SIRS (systemic inflammatory response syndrome) (HCC)   . Hypokalemia   . Tobacco abuse   . Cocaine addiction ([place])   . Cancer ([place])     uterine  . Nephrolithiasis   . Pyelonephritis   . UTI (urinary tract infection) 06/2013   Past Surgical History  Procedure Laterality Date  . Renal artery stent      stones  . Tubal ligation    . Vaginal hysterectomy     No family history on file. Social History  Substance Use Topics  . Smoking status: Current Every Day Smoker -- 1.00 packs/day    Types: Cigarettes  . Smokeless tobacco: Never Used  . Alcohol Use: Yes     Comment: occasionally   OB History    No data available      Review of Systems  Constitutional: Negative for fever.  Gastrointestinal: Positive for nausea and vomiting.  Genitourinary: Positive for dysuria, urgency, frequency and flank pain.  Musculoskeletal: Positive for back pain.  All other systems reviewed and are  negative.   Allergies  Darvocet; Ibuprofen; and Tramadol  Home Medications   Prior to Admission medications   Medication Sig Start Date End Date Taking? Authorizing Provider  azithromycin (ZITHROMAX) 250 MG tablet 2 by mouth daily 1, then 1 by mouth daily 4 days Patient not taking: Reported on 03/16/2015 11/10/14   Lacretia Leigh, MD  cephALEXin (KEFLEX) 500 MG capsule Take 1 capsule (500 mg total) by mouth 2 (two) times daily. Patient not taking: Reported on 03/16/2015 03/04/15   Lahoma Crocker Barrett, PA-C  esomeprazole (NEXIUM) 40 MG capsule Take 1 capsule (40 mg total) by mouth daily. Patient not taking: Reported on 10/22/2014 06/18/14   Francine Graven, DO  HYDROcodone-acetaminophen (NORCO/VICODIN) 5-325 MG per tablet 1 or 2 tabs PO q6 hours prn pain Patient not taking: Reported on 10/22/2014 06/18/14   Francine Graven, DO  omeprazole (PRILOSEC) 20 MG capsule Take 1 capsule (20 mg total) by mouth daily. Patient not taking: Reported on 03/16/2015 03/04/15   Lahoma Crocker Barrett, PA-C  predniSONE (DELTASONE) 20 MG tablet Take 1 tablet (20 mg total) by mouth daily. Patient not taking: Reported on 03/16/2015 11/10/14   Lacretia Leigh, MD  ranitidine (ZANTAC) 150 MG tablet Take 150 mg by mouth 2 (two) times daily.    Historical Provider, MD   BP 103/66 mmHg  Pulse 102  Temp(Src) 97.9 F (36.6 C) (Oral)  Resp 20  SpO2 96%   Physical Exam  Constitutional: She is oriented to person, place, and time. She appears well-developed and well-nourished. No  distress.  Nontoxic/nonseptic appearing  HENT:  Head: Normocephalic and atraumatic.  Eyes: Conjunctivae and EOM are normal. No scleral icterus.  Neck: Normal range of motion.  Pulmonary/Chest: Effort normal. No respiratory distress. She has no wheezes.  Respirations even and unlabored. Lungs clear.  Abdominal: Soft. She exhibits no distension. There is tenderness. There is no rebound and no guarding.  Mild diffuse abdominal tenderness. No focal  tenderness. Abdomen soft. No masses, guarding, or peritoneal signs.  Musculoskeletal: Normal range of motion. She exhibits tenderness.  Tenderness to palpation to the right lumbar paraspinal muscles. No tenderness to the lumbosacral midline. No bony deformities, step-offs, or crepitus.  Neurological: She is alert and oriented to person, place, and time. She exhibits normal muscle tone. Coordination normal.  GCS 15. Speech is goal oriented. Patient moves extremities without ataxia.  Skin: Skin is warm and dry. No rash noted. She is not diaphoretic. No erythema. No pallor.  Psychiatric: She has a normal mood and affect. Her behavior is normal.  Nursing note and vitals reviewed.   ED Course  Procedures (including critical care time) Labs Review Labs Reviewed  URINALYSIS, ROUTINE W REFLEX MICROSCOPIC (NOT AT [facility]) - Abnormal; Notable for the following:    APPearance CLOUDY (*)    Hgb urine dipstick MODERATE (*)    Nitrite POSITIVE (*)    Leukocytes, UA TRACE (*)    All other components within normal limits  URINE MICROSCOPIC-ADD ON - Abnormal; Notable for the following:    Squamous Epithelial / LPF 0-5 (*)    Bacteria, UA MANY (*)    All other components within normal limits  URINE CULTURE    Imaging Review No results found.   I have personally reviewed and evaluated these images and lab results as part of my medical decision-making.   EKG Interpretation None      MDM   Final diagnoses:  UTI (lower urinary tract infection)  Flank pain    54 year old female presents to the emergency department for evaluation of low back pain for 5 days. Symptoms associated with urinary frequency and dysuria. Patient appears to have a urinary tract infection. She was diagnosed with a UTI 1 week ago. She reports taking her full course of antibiotics. Urine has been sent for culture today to ensure proper antibiotic treatment. Will start back on Keflex. Patient given fentanyl for pain control.  Will discharge with a short course of Norco. No red flags or signs concerning for cauda equina today. Back pain may be secondary to ascending UTI. Patient referred back to her primary care doctor for follow-up in one week. Return precautions given at discharge. Patient discharged in satisfactory condition.    Antonietta Breach, PA-C 03/22/15 [id number]  Everlene Balls, MD 03/22/15 [phone]

## 2015-03-22 NOTE — Discharge Instructions (Signed)

## 2015-03-24 LAB — URINE CULTURE: Culture: >=100000

## 2015-03-25 ENCOUNTER — Telehealth (HOSPITAL_COMMUNITY): Payer: Self-pay

## 2015-03-25 NOTE — Telephone Encounter (Signed)
Post ED Visit - Positive Culture Follow-up  Culture report reviewed by antimicrobial stewardship pharmacist:  []  Elenor Quinones, Pharm.D. []  Heide Guile, Pharm.D., BCPS []  Parks Neptune, Pharm.D. []  Alycia Rossetti, Pharm.D., BCPS [x]  [place], [place].D., BCPS, AAHIVP []  Legrand Como, Pharm.D., BCPS, AAHIVP []  Milus Glazier, Pharm.D. []  Stephens November, Pharm.D.  Positive urine culture, >/= 100,000 colonies -> E Coli Treated with Cephalexin, organism sensitive to the same and no further patient follow-up is required at this time.  Dortha Kern 03/25/2015, 10:16 AM

## 2015-10-10 ENCOUNTER — Encounter (HOSPITAL_COMMUNITY): Payer: Self-pay | Admitting: Emergency Medicine

## 2015-10-10 ENCOUNTER — Emergency Department (HOSPITAL_COMMUNITY)
Admission: EM | Admit: 2015-10-10 | Discharge: 2015-10-10 | Disposition: A | Discharge status: Home / Self Care | Payer: No Typology Code available for payment source | Attending: Emergency Medicine | Admitting: Emergency Medicine

## 2015-10-10 DIAGNOSIS — Z8542 Personal history of malignant neoplasm of other parts of uterus: Secondary | ICD-10-CM | POA: Insufficient documentation

## 2015-10-10 DIAGNOSIS — F1410 Cocaine abuse, uncomplicated: Secondary | ICD-10-CM | POA: Insufficient documentation

## 2015-10-10 DIAGNOSIS — F1120 Opioid dependence, uncomplicated: Secondary | ICD-10-CM | POA: Insufficient documentation

## 2015-10-10 DIAGNOSIS — F17210 Nicotine dependence, cigarettes, uncomplicated: Secondary | ICD-10-CM | POA: Insufficient documentation

## 2015-10-10 NOTE — ED Notes (Addendum)
Room air sats 88% when patient sleeping. O2 2l/min via [place] applied. Sats increased to 96%.

## 2015-10-10 NOTE — ED Notes (Signed)
This RN contacted poison control spoke with Kristi. Advised to give narcan if pt respirations decrease, but push slow due to hx of abuse do not want to push her into withdrawals. Advised to check acetaminophen levels and CMP.

## 2015-10-10 NOTE — ED Notes (Addendum)
Per EMS pt brought in for hydrocodone overdose. According to husband pt took 7 hydrocodone this morning and had a couple 40's last night saying she's "Just tired of everybody." EMS found pt on ground, husband pulled pt out of vehicle and was doing mouth to mouth. EMS performed sternal rub and bagged pt due to shallow respirations of about 6 per minute. Pt aroused and sat on 98% on 4L [place]. Pt only c/o neck pain. Pt reports snorting heroin this morning as well.

## 2015-10-10 NOTE — ED Provider Notes (Signed)
CSN: [id number]     Arrival date & time 10/10/15  [id number] History   First MD Initiated Contact with Patient 10/10/15 [phone]     Chief Complaint  Patient presents with  . Drug Overdose  . Neck Pain     (Consider location/radiation/quality/duration/timing/severity/associated sxs/prior Treatment) HPI Comments: Patient here after snorting heroin to treat her chronic neck pain. States that she ran out of her opiates and that this was not a suicide attempt. Her neck pain is located on the left side in this worse with movement and palpation. Denies any new weakness in her extremities. Denies taking any hydrocodone. No use of alcohol. No other somatic complaints at this time. Boyfriend called EMS and patient transported here.  Patient is a 55 y.o. female presenting with Overdose and neck pain. The history is provided by the patient. The history is limited by the condition of the patient.  Drug Overdose  Neck Pain   Past Medical History  Diagnosis Date  . SIRS (systemic inflammatory response syndrome) (HCC)   . Hypokalemia   . Tobacco abuse   . Cocaine addiction ([place])   . Cancer ([place])     uterine  . Nephrolithiasis   . Pyelonephritis   . UTI (urinary tract infection) 06/2013   Past Surgical History  Procedure Laterality Date  . Renal artery stent      stones  . Tubal ligation    . Vaginal hysterectomy     No family history on file. Social History  Substance Use Topics  . Smoking status: Current Every Day Smoker -- 1.00 packs/day    Types: Cigarettes  . Smokeless tobacco: Never Used  . Alcohol Use: Yes     Comment: occasionally   OB History    No data available     Review of Systems  Unable to perform ROS: Mental status change  Musculoskeletal: Positive for neck pain.      Allergies  Darvocet; Ibuprofen; and Tramadol  Home Medications   Prior to Admission medications   Medication Sig Start Date End Date Taking? Authorizing Provider  azithromycin (ZITHROMAX) 250 MG  tablet 2 by mouth daily 1, then 1 by mouth daily 4 days Patient not taking: Reported on 03/16/2015 11/10/14   Lacretia Leigh, MD  cephALEXin (KEFLEX) 500 MG capsule Take 1 capsule (500 mg total) by mouth 2 (two) times daily. 03/22/15   Antonietta Breach, PA-C  esomeprazole (NEXIUM) 40 MG capsule Take 1 capsule (40 mg total) by mouth daily. Patient not taking: Reported on 10/22/2014 06/18/14   Francine Graven, DO  HYDROcodone-acetaminophen (NORCO/VICODIN) 5-325 MG tablet 1 or 2 tabs PO q6 hours prn pain 03/22/15   Antonietta Breach, PA-C  omeprazole (PRILOSEC) 20 MG capsule Take 1 capsule (20 mg total) by mouth daily. Patient not taking: Reported on 03/16/2015 03/04/15   Lahoma Crocker Barrett, PA-C  predniSONE (DELTASONE) 20 MG tablet Take 1 tablet (20 mg total) by mouth daily. Patient not taking: Reported on 03/16/2015 11/10/14   Lacretia Leigh, MD  ranitidine (ZANTAC) 150 MG tablet Take 150 mg by mouth 2 (two) times daily.    Historical Provider, MD   BP 116/62 mmHg  Pulse 72  Temp(Src) 97.7 F (36.5 C) (Oral)  Resp 12  SpO2 95% Physical Exam  Constitutional: She is oriented to person, place, and time. She appears well-developed and well-nourished. She appears lethargic.  Non-toxic appearance. No distress.  HENT:  Head: Normocephalic and atraumatic.  Eyes: Conjunctivae, EOM and lids are normal. Pupils are equal, round, and  reactive to light.  Neck: Normal range of motion. Neck supple. No tracheal deviation present. No thyroid mass present.  Cardiovascular: Normal rate, regular rhythm and normal heart sounds.  Exam reveals no gallop.   No murmur heard. Pulmonary/Chest: Effort normal and breath sounds normal. No stridor. No respiratory distress. She has no decreased breath sounds. She has no wheezes. She has no rhonchi. She has no rales.  Abdominal: Soft. Normal appearance and bowel sounds are normal. She exhibits no distension. There is no tenderness. There is no rebound and no CVA tenderness.  Musculoskeletal:  Normal range of motion. She exhibits no edema or tenderness.  Neurological: She is oriented to person, place, and time. She has normal strength. She appears lethargic. No cranial nerve deficit or sensory deficit. GCS eye subscore is 4. GCS verbal subscore is 5. GCS motor subscore is 6.  Skin: Skin is warm and dry. No abrasion and no rash noted.  Psychiatric: Her affect is blunt. Her speech is delayed. She is slowed. She expresses no suicidal ideation. She expresses no suicidal plans.  Nursing note and vitals reviewed.   ED Course  Procedures (including critical care time) Labs Review Labs Reviewed - No data to display  Imaging Review No results found. I have personally reviewed and evaluated these images and lab results as part of my medical decision-making.   EKG Interpretation None      MDM   Final diagnoses:  None    Patient monitored here and now awake and alert. She ambulated in the department without difficulty. Pulse oximetry was 99% on room air. Continues to deny homicidal or suicidal ideations. Discharged with her boyfriend    Lacretia Leigh, MD 10/10/15 1149

## 2015-10-10 NOTE — Discharge Instructions (Signed)
Opioid Use Disorder °Opioid use disorder is a mental disorder. It is the continued nonmedical use of opioids in spite of risks to health and well-being. Misused opioids include the street drug heroin. They also include pain medicines such as morphine, hydrocodone, oxycodone, and fentanyl. Opioids are very addictive. People who misuse opioids get an exaggerated feeling of well-being. Opioid use disorder often disrupts activities at home, work, or school. It may cause mental or physical problems.  °A family history of opioid use disorder puts you at higher risk of it. People with opioid use disorder often misuse other drugs or have mental illness such as depression, posttraumatic stress disorder, or antisocial personality disorder. They also are at risk of suicide and death from overdose. °SIGNS AND SYMPTOMS  °Signs and symptoms of opioid use disorder include: °· Use of opioids in larger amounts or over a longer period than intended. °· Unsuccessful attempts to cut down or control opioid use. °· A lot of time spent obtaining, using, or recovering from the effects of opioids. °· A strong desire or urge to use opioids (craving). °· Continued use of opioids in spite of major problems at work, school, or home because of use. °· Continued use of opioids in spite of relationship problems because of use. °· Giving up or cutting down on important life activities because of opioid use. °· Use of opioids over and over in situations when it is physically hazardous, such as driving a car. °· Continued use of opioids in spite of a physical problem that is likely related to use. Physical problems can include: °¨ Severe constipation. °¨ Poor nutrition. °¨ Infertility. °¨ Tuberculosis. °¨ Aspiration pneumonia. °¨ Infections such as human immunodeficiency virus (HIV) and hepatitis (from injecting opioids). °· Continued use of opioids in spite of a mental problem that is likely related to use. Mental problems can  include: °¨ Depression. °¨ Anxiety. °¨ Hallucinations. °¨ Sleep problems. °¨ Loss of sexual function. °· Need to use more and more opioids to get the same effect, or lessened effect over time with use of the same amount (tolerance). °· Having withdrawal symptoms when opioid use is stopped, or using opioids to reduce or avoid withdrawal symptoms. Withdrawal symptoms include: °¨ Depressed, anxious, or irritable mood. °¨ Nausea, vomiting, diarrhea, or intestinal cramping. °¨ Muscle aches or spasms. °¨ Excessive tearing or runny nose. °¨ Dilated pupils, sweating, or hairs standing on end. °¨ Yawning. °¨ Fever, raised blood pressure, or fast pulse. °¨ Restlessness or trouble sleeping. This does not apply to people taking opioids for medical reasons only. °DIAGNOSIS °Opioid use disorder is diagnosed by your health care provider. You may be asked questions about your opioid use and and how it affects your life. A physical exam may be done. A drug screen may be ordered. You may be referred to a mental health professional. The diagnosis of opioid use disorder requires at least two symptoms within 12 months. The type of opioid use disorder you have depends on the number of signs and symptoms you have. The type may be: °· Mild. Two or three signs and symptoms.    °· Moderate. Four or five signs and symptoms.   °· Severe. Six or more signs and symptoms. °TREATMENT  °Treatment is usually provided by mental health professionals with training in substance use disorders. The following options are available: °· Detoxification. This is the first step in treatment for withdrawal. It is medically supervised withdrawal with the use of medicines. These medicines lessen withdrawal symptoms. They also raise the chance   of becoming opioid free.  Counseling, also known as talk therapy. Talk therapy addresses the reasons you use opioids. It also addresses ways to keep you from using again (relapse). The goals of talk therapy are to avoid  relapse by:  Identifying and avoiding triggers for use.  Finding healthy ways to cope with stress.  Learning how to handle cravings.  Support groups. Support groups provide emotional support, advice, and guidance.  A medicine that blocks opioid receptors in your brain. This medicine can reduce opioid cravings that lead to relapse. This medicine also blocks the desired opioid effect when relapse occurs.  Opioids that are taken by mouth in place of the misused opioid (opioid maintenance treatment). These medicines satisfy cravings but are safer than commonly misused opioids. This often is the best option for people who continue to relapse with other treatments. HOME CARE INSTRUCTIONS   Take medicines only as directed by your health care provider.  Check with your health care provider before starting new medicines.  Keep all follow-up visits as directed by your health care provider. SEEK MEDICAL CARE IF:  You are not able to take your medicines as directed.  Your symptoms get worse. SEEK IMMEDIATE MEDICAL CARE IF:  You have serious thoughts about hurting yourself or others.  You may have taken an overdose of opioids. [place] on Drug Abuse: motorcyclefax.com  Substance Abuse and Mental Health Services Administration: ktimeonline.com   This information is not intended to replace advice given to you by your health care provider. Make sure you discuss any questions you have with your health care provider.   Document Released: 02/04/2007 Document Revised: 04/30/2014 Document Reviewed: 04/22/2013 Elsevier Interactive Patient Education 2016 [facility] The [employer] 211 is a great source of information about community services available.  Access by dialing 2-1-1 from anywhere in [place], or by website -  CustodianSupply.fi.   Other Local Resources (Updated 04/2015)  [facility]  Crisis Hotline, available 24 hours a day, 7 days a week: [place], [place]   [facility]  Crisis Hotline, available 24 hours a day, 7 days a week: [place], [place]  [facility]  Suicide Prevention Hotline, available 24 hours a day, 7 days a week: [place], [place], available 24 hours a day, 7 days a week: [place], [place] Access to [employer], available 24 hours a day, 7 days a week: [phone] All   [facility]  Crisis Hotline, available 24 hours a day, 7 days a week: 530-224-5768 All   Other Local Resources (Updated 04/2015)  Outpatient Counseling/ Substance Abuse Programs  Services     Address and Phone Number  ADS ([facility]cohol and Drug Services)   Options include Individual counseling, group counseling, intensive outpatient program (several hours a day, several days a week)  Offers depression assessments  Provides methadone maintenance program [phone] [address] [place], [place] partial hospitalization/day treatment and DUI/DWI programs  [employer], private insurance [phone] [address], [phone] [place], [place] [address]  [place] include intensive outpatient program (several hours a day, several days a week), outpatient treatment, DUI/DWI services, family education  Also has some services specifically for [employer]  Offers transitional housing  [phone] [place], [place] [address]     [place] Medicare, private pay, and private insurance [phone] [address] [place], [place], [place] [address]  [facility]  Services include individual counseling, substance  abuse intensive outpatient program (several hours a day, several days a week), day treatment  Blinda Leatherwood, Medicaid, private insurance [phone] [address], [place], [place] [address]  [facility]   Offers substance abuse intensive outpatient program (several hours a day, several days a week), partial hospitalization program [phone] [address] [place], [place] [address]  [phone] [address] [place], [place] [address]  [phone] [place], [place] [address]  [phone], [place], [place] [address]  [facility]  Individual counseling only  Accepts private insurance only [phone] [address] [place], [place] [address]  [facility]  Methadone maintenance program [phone] [place], [place] [address]  [facility] providing substance abuse and mental health counseling  Accepts Medicaid, Medicare, private insurance  Offers sliding scale for uninsured [phone] [place], [place] in [place] individual counseling, and intensive in-home services [phone] [address], [place], [place] [address]  [facility] individual counseling, family counseling, group therapy, domestic violence counseling, consumer credit counseling  Accepts Medicare, Medicaid, private insurance  Offers sliding scale for uninsured [phone] [address] [place], [place] [address]  [phone] [facility], [address] [place], [place]  [facility]  Offers individual, family and group counseling  3 locations - [place], [place], and [place]  [place] [place], [place] [address]  [address] [place], [place] [address]  [place], [place] [address]   [facility]    Offers psychiatric assessment, 8-week Intensive Outpatient Program (several hours a day, several times a week, daytime or evenings), early recovery group, family Program, medication management  Private pay or private insurance only [phone], or  [phone] [address] [place], [place] [address]  [facility]  Offers individual, couples and family counseling  Accepts Medicaid, private insurance, and sliding scale for uninsured [phone] [address] [place], [place] [address]  Launa Flight, MD  Individual counseling  Private insurance [phone] [place], [place] [address]  [facility]   Offers assessment, substance abuse treatment, and behavioral health treatment [phone] [place], [place] [address]  [facility]  Individual counseling  Accepts private insurance [phone] [place], [place] [address]  [facility]  Individual counseling  Blinda Leatherwood, private insurance [phone] [place], [place] [address]  [place]    Offers intensive outpatient program (several hours a day, several times a week)  Private pay, private insurance [phone] [place], [place]  Individual counseling  Medicare, private insurance [phone] [address] [place], [place] [address]  [facility]    Offers intensive outpatient program (several hours a day, several times a week) and partial hospitalization program [phone] [place], [place] [address]  Letta Moynahan, MD  Individual counseling [phone] [address], [place],  [address]  [place]  Christian counseling to individuals, couples, and families  Accepts  Medicare and private insurance; offers sliding scale for uninsured [phone] [place], [place] [address]  [facility]  Christian counseling [phone] [address] [place], [place] [address]  [facility] [employer] crisis counseling, individual counseling, group therapy, in-home therapy, domestic violence services, day treatment, DWI services, Conservation officer, nature (CST), Actuary (ACTT), substance abuse Intensive Outpatient Program (several hours a day, several times a week)  2 locations - [place] and [place], [place] [address]  [phone] [address] [place], [place] [address]  [facility] counseling and group therapy  [employer], [place], [place] [phone] [address] [place], [place]  [facility]  Offers individual and family counseling  Offers LGBTQ services  Accepts private insurance and private pay [phone] [place], [place] [address]  [facility]    Offers individual counseling, group therapy, and outpatient detox  Accepts private insurance [phone] [place], [place] Medicare, private insurance [phone] [address] [place], [place] [address]  [employer]  Individual counseling  Accepts Medicare, private insurance [phone] [address], [place] [address]  [facility] substance abuse Intensive Outpatient Program (several hours a day, several times a week) [phone], or [phone] [place], [place]

## 2015-10-10 NOTE — ED Notes (Signed)
Pt reports not wanting to harm herself. Pt states this was for pain management.

## 2015-10-10 NOTE — ED Notes (Signed)
Patient stated she did not take Hydrocodone. Patient stated, "i only did a line of heroin."

## 2016-01-21 ENCOUNTER — Encounter (HOSPITAL_COMMUNITY): Payer: Self-pay

## 2016-01-21 ENCOUNTER — Inpatient Hospital Stay (HOSPITAL_COMMUNITY)
Admission: EM | Admit: 2016-01-21 | Discharge: 2016-01-23 | DRG: 918 | Disposition: A | Discharge status: Home / Self Care | Payer: No Typology Code available for payment source | Attending: Internal Medicine | Admitting: Internal Medicine

## 2016-01-21 ENCOUNTER — Emergency Department (HOSPITAL_COMMUNITY): Payer: No Typology Code available for payment source

## 2016-01-21 DIAGNOSIS — Z23 Encounter for immunization: Secondary | ICD-10-CM

## 2016-01-21 DIAGNOSIS — F1410 Cocaine abuse, uncomplicated: Secondary | ICD-10-CM

## 2016-01-21 DIAGNOSIS — R262 Difficulty in walking, not elsewhere classified: Secondary | ICD-10-CM

## 2016-01-21 DIAGNOSIS — E876 Hypokalemia: Secondary | ICD-10-CM | POA: Diagnosis present

## 2016-01-21 DIAGNOSIS — F1110 Opioid abuse, uncomplicated: Secondary | ICD-10-CM | POA: Diagnosis present

## 2016-01-21 DIAGNOSIS — B9620 Unspecified Escherichia coli [E. coli] as the cause of diseases classified elsewhere: Secondary | ICD-10-CM | POA: Diagnosis present

## 2016-01-21 DIAGNOSIS — T402X4A Poisoning by other opioids, undetermined, initial encounter: Secondary | ICD-10-CM

## 2016-01-21 DIAGNOSIS — D72829 Elevated white blood cell count, unspecified: Secondary | ICD-10-CM

## 2016-01-21 DIAGNOSIS — T402X1A Poisoning by other opioids, accidental (unintentional), initial encounter: Principal | ICD-10-CM | POA: Diagnosis present

## 2016-01-21 DIAGNOSIS — N390 Urinary tract infection, site not specified: Secondary | ICD-10-CM

## 2016-01-21 DIAGNOSIS — R400 Somnolence: Secondary | ICD-10-CM | POA: Diagnosis present

## 2016-01-21 DIAGNOSIS — F17210 Nicotine dependence, cigarettes, uncomplicated: Secondary | ICD-10-CM | POA: Diagnosis present

## 2016-01-21 LAB — CBC
HCT: 38.3 % (ref 36.0–46.0)
HEMATOCRIT: 42.8 % (ref 36.0–46.0)
HEMOGLOBIN: 12.9 g/dL (ref 12.0–15.0)
Hemoglobin: 14.1 g/dL (ref 12.0–15.0)
MCH: 31.6 pg (ref 26.0–34.0)
MCH: 31.1 pg (ref 26.0–34.0)
MCHC: 32.9 g/dL (ref 30.0–36.0)
MCHC: 33.7 g/dL (ref 30.0–36.0)
MCV: 96 fL (ref 78.0–100.0)
MCV: 92.3 fL (ref 78.0–100.0)
PLATELETS: 343 10*3/uL (ref 150–400)
Platelets: 310 10*3/uL (ref 150–400)
RBC: 4.46 MIL/uL (ref 3.87–5.11)
RBC: 4.15 MIL/uL (ref 3.87–5.11)
RDW: 13.9 % (ref 11.5–15.5)
RDW: 13.4 % (ref 11.5–15.5)
WBC: 11.6 10*3/uL — AB (ref 4.0–10.5)
WBC: 11.2 10*3/uL — ABNORMAL HIGH (ref 4.0–10.5)

## 2016-01-21 LAB — URINALYSIS, ROUTINE W REFLEX MICROSCOPIC
Bilirubin Urine: NEGATIVE
Glucose, UA: NEGATIVE mg/dL
KETONES UR: NEGATIVE mg/dL
NITRITE: POSITIVE — AB
PROTEIN: NEGATIVE mg/dL
Specific Gravity, Urine: 1.018 (ref 1.005–1.030)
pH: 6.5 (ref 5.0–8.0)

## 2016-01-21 LAB — BASIC METABOLIC PANEL
ANION GAP: 8 (ref 5–15)
BUN: 13 mg/dL (ref 6–20)
CALCIUM: 9.3 mg/dL (ref 8.9–10.3)
CO2: 26 mmol/L (ref 22–32)
Chloride: 107 mmol/L (ref 101–111)
Creatinine, Ser: 1 mg/dL (ref 0.44–1.00)
Glucose, Bld: 198 mg/dL — ABNORMAL HIGH (ref 65–99)
POTASSIUM: 3.7 mmol/L (ref 3.5–5.1)
Sodium: 141 mmol/L (ref 135–145)

## 2016-01-21 LAB — RAPID URINE DRUG SCREEN, HOSP PERFORMED
Amphetamines: NOT DETECTED
BENZODIAZEPINES: NOT DETECTED
Barbiturates: NOT DETECTED
COCAINE: POSITIVE — AB
OPIATES: POSITIVE — AB
Tetrahydrocannabinol: NOT DETECTED

## 2016-01-21 LAB — I-STAT TROPONIN, ED: TROPONIN I, POC: 0 ng/mL (ref 0.00–0.08)

## 2016-01-21 LAB — CREATININE, SERUM
CREATININE: 0.84 mg/dL (ref 0.44–1.00)
GFR calc Af Amer: >60 mL/min (ref >60)
GFR calc non Af Amer: >60 mL/min (ref >60)

## 2016-01-21 LAB — TROPONIN I: Troponin I: <0.03 ng/mL (ref <0.03)

## 2016-01-21 LAB — BRAIN NATRIURETIC PEPTIDE: B Natriuretic Peptide: 29.4 pg/mL (ref 0.0–100.0)

## 2016-01-21 LAB — TSH: TSH: 0.581 u[IU]/mL (ref 0.350–4.500)

## 2016-01-21 LAB — URINE MICROSCOPIC-ADD ON: SQUAMOUS EPITHELIAL / LPF: NONE SEEN

## 2016-01-21 LAB — LACTIC ACID, PLASMA: LACTIC ACID, VENOUS: 1 mmol/L (ref 0.5–1.9)

## 2016-01-21 LAB — GLUCOSE, CAPILLARY
Glucose-Capillary: 91 mg/dL (ref 65–99)
Glucose-Capillary: 89 mg/dL (ref 65–99)

## 2016-01-21 MED ORDER — DEXTROSE 5 % IV SOLN
1.0000 g | INTRAVENOUS | Status: DC
  Administered 2016-01-22: 1 g via INTRAVENOUS
  Filled 2016-01-21 (×2): qty 10

## 2016-01-21 MED ORDER — ACETAMINOPHEN 650 MG RE SUPP: 650.0000 mg | Freq: Four times a day (QID) | RECTAL | Status: DC | PRN

## 2016-01-21 MED ORDER — PNEUMOCOCCAL VAC POLYVALENT 25 MCG/0.5ML IJ INJ
0.5000 mL | INJECTION | INTRAMUSCULAR | Status: AC
  Administered 2016-01-22: 0.5 mL via INTRAMUSCULAR
  Filled 2016-01-21 (×2): qty 0.5

## 2016-01-21 MED ORDER — SODIUM CHLORIDE 0.9 % IV SOLN
INTRAVENOUS | Status: AC
  Administered 2016-01-21 – 2016-01-22 (×2): via INTRAVENOUS

## 2016-01-21 MED ORDER — SODIUM CHLORIDE 0.9 % IV BOLUS (SEPSIS)
1000.0000 mL | Freq: Once | INTRAVENOUS | Status: AC
  Administered 2016-01-21: 1000 mL via INTRAVENOUS

## 2016-01-21 MED ORDER — ONDANSETRON HCL 4 MG/2ML IJ SOLN
4.0000 mg | Freq: Four times a day (QID) | INTRAMUSCULAR | Status: DC | PRN
  Administered 2016-01-23: 4 mg via INTRAVENOUS
  Filled 2016-01-21: qty 2

## 2016-01-21 MED ORDER — NALOXONE HCL 0.4 MG/ML IJ SOLN
0.4000 mg | Freq: Once | INTRAMUSCULAR | Status: AC
  Administered 2016-01-21: 0.4 mg via INTRAVENOUS
  Filled 2016-01-21: qty 1

## 2016-01-21 MED ORDER — NALOXONE HCL 2 MG/2ML IJ SOSY
1.0000 mg | PREFILLED_SYRINGE | Freq: Once | INTRAMUSCULAR | Status: AC
  Administered 2016-01-21: 1 mg via INTRAVENOUS
  Filled 2016-01-21: qty 2

## 2016-01-21 MED ORDER — ONDANSETRON HCL 4 MG PO TABS: 4.0000 mg | ORAL_TABLET | Freq: Four times a day (QID) | ORAL | Status: DC | PRN

## 2016-01-21 MED ORDER — INFLUENZA VAC SPLIT QUAD 0.5 ML IM SUSY
0.5000 mL | PREFILLED_SYRINGE | INTRAMUSCULAR | Status: AC
  Administered 2016-01-22: 0.5 mL via INTRAMUSCULAR
  Filled 2016-01-21: qty 0.5

## 2016-01-21 MED ORDER — SODIUM CHLORIDE 0.9 % IV SOLN: 250.0000 mL | INTRAVENOUS | Status: DC | PRN

## 2016-01-21 MED ORDER — SODIUM CHLORIDE 0.9% FLUSH: 3.0000 mL | INTRAVENOUS | Status: DC | PRN

## 2016-01-21 MED ORDER — SODIUM CHLORIDE 0.9% FLUSH
3.0000 mL | Freq: Two times a day (BID) | INTRAVENOUS | Status: DC
  Administered 2016-01-21: 3 mL via INTRAVENOUS

## 2016-01-21 MED ORDER — NALOXONE HCL 0.4 MG/ML IJ SOLN: 0.4000 mg | INTRAMUSCULAR | Status: DC | PRN

## 2016-01-21 MED ORDER — INSULIN ASPART 100 UNIT/ML ~~LOC~~ SOLN
0.0000 [IU] | Freq: Three times a day (TID) | SUBCUTANEOUS | Status: DC
  Administered 2016-01-22: 2 [IU] via SUBCUTANEOUS
  Administered 2016-01-23 (×2): 1 [IU] via SUBCUTANEOUS

## 2016-01-21 MED ORDER — ACETAMINOPHEN 325 MG PO TABS
650.0000 mg | ORAL_TABLET | Freq: Four times a day (QID) | ORAL | Status: DC | PRN
  Administered 2016-01-22: 650 mg via ORAL
  Filled 2016-01-21: qty 2

## 2016-01-21 MED ORDER — SENNOSIDES-DOCUSATE SODIUM 8.6-50 MG PO TABS: 1.0000 | ORAL_TABLET | Freq: Every evening | ORAL | Status: DC | PRN

## 2016-01-21 MED ORDER — ENOXAPARIN SODIUM 40 MG/0.4ML ~~LOC~~ SOLN
40.0000 mg | SUBCUTANEOUS | Status: DC
  Administered 2016-01-21 – 2016-01-22 (×2): 40 mg via SUBCUTANEOUS
  Filled 2016-01-21 (×2): qty 0.4

## 2016-01-21 MED ORDER — DEXTROSE 5 % IV SOLN
1.0000 g | Freq: Once | INTRAVENOUS | Status: AC
  Administered 2016-01-21: 1 g via INTRAVENOUS
  Filled 2016-01-21: qty 10

## 2016-01-21 MED ORDER — FAMOTIDINE 20 MG PO TABS
20.0000 mg | ORAL_TABLET | Freq: Two times a day (BID) | ORAL | Status: DC
  Administered 2016-01-21 – 2016-01-23 (×4): 20 mg via ORAL
  Filled 2016-01-21 (×4): qty 1

## 2016-01-21 NOTE — ED Notes (Signed)
Was advised by ED clerk that the room that pt has been assigned is being cleaned and the Charge RN will call when ready

## 2016-01-21 NOTE — H&P (Signed)
History and Physical  Kelli Jenkins [id number] DOB: 1960-05-16 DOA: 01/21/2016  Referring physician: Canary Brim, MD PCP: No PCP Per Patient   Chief Complaint: not feeling well  HPI: Kelli Jenkins is a 55 y.o. female polysubstance abuser who presents to the ED reporting that she has not been feeling well for nearly 2 weeks.  She has had cough and malaise.  Her husband is present and he says that she has been confused and lethargic for that last several hours prior to arrival. The patient is lethargic but denies that she has taken any recreational drugs.   She initially was hypotensive and falling asleep.  She was given narcan ini ED and immediately started waking up.  She had to receive a second dose of narcan because she started falling asleep again.  Her UDS was positive for opiates and cocaine.  She was noted to have a UTI.  She is being admitted for observation as she has required several doses of naloxone.    Review of Systems: All systems reviewed and apart from history of presenting illness, are negative.  Past Medical History:  Diagnosis Date  . 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 Cocaine addiction ([place])   . Hypokalemia   . Nephrolithiasis   . Pyelonephritis   . SIRS (systemic inflammatory response syndrome) ([facility])   . Tobacco abuse   . UTI (urinary tract infection) 06/2013   Past Surgical History:  Procedure Laterality Date  . RENAL ARTERY STENT     stones  . TUBAL LIGATION    . VAGINAL HYSTERECTOMY     Social History:  reports that she has been smoking Cigarettes.  She has been smoking about 1.00 pack per day. She has never used smokeless tobacco. She reports that she drinks alcohol. She reports that she does not use drugs.   Allergies  Allergen Reactions  . Darvocet [Propoxyphene N-Acetaminophen] Swelling  . Ibuprofen Swelling  . Tramadol Swelling    History reviewed. No pertinent family history.   Prior to Admission medications   Medication Sig Start Date End Date  Taking? Authorizing Provider  ranitidine (ZANTAC) 150 MG tablet Take 150 mg by mouth 2 (two) times daily.   Yes Historical Provider, MD  omeprazole (PRILOSEC) 20 MG capsule Take 1 capsule (20 mg total) by mouth daily. Patient not taking: Reported on 01/21/2016 03/04/15   Josephina Gip, PA-C   Physical Exam: Vitals:   01/21/16 1209 01/21/16 1255 01/21/16 1326 01/21/16 1411  BP: 122/74 126/62 120/91 131/67  Pulse: 103 110 80 98  Resp: 22 18 22 22   Temp:      TempSrc:      SpO2: 100% 99% 90% 94%  Weight:      Height:         General exam: Moderately built and nourished patient, lying comfortably supine on the gurney in no obvious distress. She is lethargic but easily arousable.    Head, eyes and ENT: Nontraumatic and normocephalic. Pupils equally reacting to light and accommodation. Oral mucosa dry.  Neck: Supple. No JVD, carotid bruit or thyromegaly.  Lymphatics: No lymphadenopathy.  Respiratory system: Clear to auscultation. No increased work of breathing.  Cardiovascular system: S1 and S2 heard. No JVD.   Gastrointestinal system: Abdomen is nondistended, soft and nontender. Normal bowel sounds heard. No organomegaly or masses appreciated.  Central nervous system: Alert and oriented. No focal neurological deficits.  Extremities: Symmetric 5 x 5 power. Peripheral pulses symmetrically felt.   Skin: No  rashes or acute findings.  Musculoskeletal system: Negative exam.  Psychiatry: Flat affect.  Labs on Admission:  Basic Metabolic Panel:  Recent Labs Lab 01/21/16 1145  NA 141  K 3.7  CL 107  CO2 26  GLUCOSE 198*  BUN 13  CREATININE 1.00  CALCIUM 9.3   Liver Function Tests: No results for input(s): AST, ALT, ALKPHOS, BILITOT, PROT, ALBUMIN in the last 168 hours. No results for input(s): LIPASE, AMYLASE in the last 168 hours. No results for input(s): AMMONIA in the last 168 hours. CBC:  Recent Labs Lab 01/21/16 1145  WBC 11.6*  HGB 14.1  HCT 42.8  MCV 96.0   PLT 343   Cardiac Enzymes: No results for input(s): CKTOTAL, CKMB, CKMBINDEX, TROPONINI in the last 168 hours.  BNP (last 3 results) No results for input(s): PROBNP in the last 8760 hours. CBG: No results for input(s): GLUCAP in the last 168 hours.  Radiological Exams on Admission: Dg Chest 2 View  Result Date: 01/21/2016 CLINICAL DATA:  Cough for several weeks EXAM: CHEST  2 VIEW COMPARISON:  March 04, 2015 FINDINGS: The heart size and mediastinal contours are within normal limits. Both lungs are clear. The visualized skeletal structures are unremarkable. IMPRESSION: No active cardiopulmonary disease. Electronically Signed   By: Dorise Bullion III M.D   On: 01/21/2016 12:39    EKG: Independently reviewed.   Assessment/Plan Active Problems:   Leukocytosis   UTI (lower urinary tract infection)   Opioid overdose   Somnolence   Cocaine abuse   Opioid abuse   Opioid and cocaine abuse with suspected opioid overdose - Pt being admitted for further observation and supportive care.  She is waking up after receiving multiple doses of naloxone.  Did not start naloxone drip as she had no respiratory compromise and responded well to the first 2 doses of naloxone.  Aspiration precautions ordered.  Consult Education officer, museum for treatment options for drug abuse.   UTI - urine cultures ordered, ceftriaxone IV ordered.   Leukocytosis - secondary to UTI.    DVT Prophylaxis: enoxaparin Code Status: full Family Communication: husband at bedside  Disposition Plan: home when medically stable  Irwin Brakeman, MD [facility] Pager [phone]  If 7PM-7AM, please contact night-coverage [email or web address] Password [email or web address] 01/21/2016, 3:07 PM

## 2016-01-21 NOTE — Progress Notes (Signed)
CSW spoke with patient at bedside, regarding substance abuse treatment options. Patient was in and out falling asleep, throughout the duration of the conversation. CSW specifically mentioned the [facility] hotline, that the patient could contact to assist in obtaining substance abuse treatment services because patient is uninsured. CSW asked patient if she would be able to follow up with substance abuse treatment upon discharge, patient replied yes. CSW concluded consult by leaving patient with materials for substance abuse treatment options. CSW will sign off for now as social work intervention is no longer needed. Please consult [place] again if new need(s) arise.

## 2016-01-21 NOTE — ED Triage Notes (Signed)
Pt presents with c/o cough for several weeks, leg swelling, and hypotension. Pt's BP is 85/53 in triage and she falls asleep as this RN is speaking. Pt reports that she [place] unsure as to why she is so tired. Pt reports her cough is productive, no medication being used for cough.

## 2016-01-21 NOTE — ED Provider Notes (Signed)
[facility] Provider Note   CSN: [id number] Arrival date & time: 01/21/16  1027     History   Chief Complaint Chief Complaint  Patient presents with  . Cough  . Leg Swelling  . Hypotensive    HPI Kelli Jenkins is a 55 y.o. female.  HPI  A LEVEL 5 CAVEAT PERTAINS DUE TO ALTERED MENTAL STATUS.  Pt states "I just don't feel well"  She states she has been feeling more tired and sleepy for 2 weeks.  She c/o mild cough.  She is very drowsy- awakens briefly to voice then asleep again.  Her pupils are pinpoint.    Past Medical History:  Diagnosis Date  . Cancer ([place])    uterine  . Cocaine addiction ([place])   . Hypokalemia   . Nephrolithiasis   . Pyelonephritis   . SIRS (systemic inflammatory response syndrome) ([facility])   . Tobacco abuse   . UTI (urinary tract infection) 06/2013    Patient Active Problem List   Diagnosis Date Noted  . Opioid overdose 01/21/2016  . Somnolence 01/21/2016  . Cocaine abuse 01/21/2016  . Opioid abuse 01/21/2016  . UTI (lower urinary tract infection) 07/13/2013  . HCAP (healthcare-associated pneumonia) 08/16/2011  . Migraine 08/15/2011  . SIRS (systemic inflammatory response syndrome) ([place]) 08/13/2011  . Pyelonephritis 08/12/2011  . Ureteral colic [id number]  . Hypotension 08/12/2011  . Fever 08/12/2011  . Nephrolithiasis 08/12/2011  . Leukocytosis 08/12/2011  . Tobacco use disorder 08/12/2011    Past Surgical History:  Procedure Laterality Date  . RENAL ARTERY STENT     stones  . TUBAL LIGATION    . VAGINAL HYSTERECTOMY      OB History    No data available       Home Medications    Prior to Admission medications   Medication Sig Start Date End Date Taking? Authorizing Provider  ranitidine (ZANTAC) 150 MG tablet Take 150 mg by mouth 2 (two) times daily.   Yes Historical Provider, MD  omeprazole (PRILOSEC) 20 MG capsule Take 1 capsule (20 mg total) by mouth daily. Patient not taking: Reported on 01/21/2016 03/04/15    Josephina Gip, PA-C    Family History History reviewed. No pertinent family history.  Social History Social History  Substance Use Topics  . Smoking status: Current Every Day Smoker    Packs/day: 1.00    Types: Cigarettes  . Smokeless tobacco: Never Used  . Alcohol use Yes     Comment: occasionally     Allergies   Darvocet [propoxyphene n-acetaminophen]; Ibuprofen; and Tramadol   Review of Systems Review of Systems  UNABLE TO OBTAIN ROS DUE TO LEVEL 5 CAVEAT   Physical Exam Updated Vital Signs BP 137/68 (BP Location: Left Arm)   Pulse 83   Temp 98 F (36.7 C) (Oral)   Resp 20   Ht 5\' 8"  (1.727 m)   Wt 190 lb (86.2 kg)   SpO2 98%   BMI 28.89 kg/m  Vitals reviewed Physical Exam Physical Examination:  General appearance - sleeping but arousable to voice- falls immediately back to sleep Eyes - pupils pinpoint bilaterally Mouth - mucous membranes moist, pharynx normal without lesions Chest - clear to auscultation, no wheezes, rales or rhonchi, symmetric air entry Heart - normal rate, regular rhythm, normal S1, S2, no murmurs, rubs, clicks or gallops Abdomen - soft, nontender, nondistended, no masses or organomegaly Neurological - somnolent but arousable, moving all extremities, PERRL but pinpoint- 43mm after narcan and reactive, sensation  intact in extremities x 4 Extremities - peripheral pulses normal, 2+ bilateral pedal edema, no clubbing or cyanosis Skin - normal coloration and turgor, no rashes,  ED Treatments / Results  Labs (all labs ordered are listed, but only abnormal results are displayed) Labs Reviewed  CBC - Abnormal; Notable for the following:       Result Value   WBC 11.6 (*)    All other components within normal limits  BASIC METABOLIC PANEL - Abnormal; Notable for the following:    Glucose, Bld 198 (*)    All other components within normal limits  URINALYSIS, ROUTINE W REFLEX MICROSCOPIC (NOT AT [facility]) - Abnormal; Notable for the following:     APPearance CLOUDY (*)    Hgb urine dipstick SMALL (*)    Nitrite POSITIVE (*)    Leukocytes, UA SMALL (*)    All other components within normal limits  URINE RAPID DRUG SCREEN, HOSP PERFORMED - Abnormal; Notable for the following:    Opiates POSITIVE (*)    Cocaine POSITIVE (*)    All other components within normal limits  URINE MICROSCOPIC-ADD ON - Abnormal; Notable for the following:    Bacteria, UA MANY (*)    All other components within normal limits  CBC - Abnormal; Notable for the following:    WBC 11.2 (*)    All other components within normal limits  COMPREHENSIVE METABOLIC PANEL - Abnormal; Notable for the following:    Potassium 3.4 (*)    Chloride 112 (*)    Glucose, Bld 116 (*)    Total Protein 6.4 (*)    Anion gap 4 (*)    All other components within normal limits  URINE CULTURE  CULTURE, BLOOD (ROUTINE X 2)  CULTURE, BLOOD (ROUTINE X 2)  BRAIN NATRIURETIC PEPTIDE  LACTIC ACID, PLASMA  CREATININE, SERUM  TROPONIN I  TROPONIN I  TROPONIN I  TSH  CBC  GLUCOSE, CAPILLARY  GLUCOSE, CAPILLARY  GLUCOSE, CAPILLARY  I-STAT TROPOININ, ED    EKG  EKG Interpretation  Date/Time:  Saturday January 21 2016 10:51:25 EDT Ventricular Rate:  89 PR Interval:    QRS Duration: 86 QT Interval:  399 QTC Calculation: 486 R Axis:   89 Text Interpretation:  Sinus rhythm Atrial premature complex Borderline prolonged QT interval No significant change since last tracing Confirmed by [facility]  MD, Arnold Depinto [id number]) on 01/21/2016 3:44:25 PM       Radiology Dg Chest 2 View  Result Date: 01/21/2016 CLINICAL DATA:  Cough for several weeks EXAM: CHEST  2 VIEW COMPARISON:  March 04, 2015 FINDINGS: The heart size and mediastinal contours are within normal limits. Both lungs are clear. The visualized skeletal structures are unremarkable. IMPRESSION: No active cardiopulmonary disease. Electronically Signed   By: Dorise Bullion III M.D   On: 01/21/2016 12:39    Procedures Procedures  (including critical care time)  Medications Ordered in ED Medications  famotidine (PEPCID) tablet 20 mg (20 mg Oral Given 01/22/16 0854)  enoxaparin (LOVENOX) injection 40 mg (40 mg Subcutaneous Given 01/21/16 2214)  0.9 %  sodium chloride infusion ( Intravenous New Bag/Given 01/22/16 0817)  sodium chloride flush (NS) 0.9 % injection 3 mL (3 mLs Intravenous Not Given 01/22/16 0857)  sodium chloride flush (NS) 0.9 % injection 3 mL (not administered)  0.9 %  sodium chloride infusion (not administered)  acetaminophen (TYLENOL) tablet 650 mg (650 mg Oral Given 01/22/16 0854)    Or  acetaminophen (TYLENOL) suppository 650 mg ( Rectal See Alternative  01/22/16 0854)  senna-docusate (Senokot-S) tablet 1 tablet (not administered)  ondansetron (ZOFRAN) tablet 4 mg (not administered)    Or  ondansetron (ZOFRAN) injection 4 mg (not administered)  cefTRIAXone (ROCEPHIN) 1 g in dextrose 5 % 50 mL IVPB (not administered)  naloxone (NARCAN) injection 0.4 mg (not administered)  insulin aspart (novoLOG) injection 0-9 Units (0 Units Subcutaneous Not Given 01/22/16 0814)  naloxone [facility]) injection 0.4 mg (0.4 mg Intravenous Given 01/21/16 1146)  naloxone (NARCAN) injection 1 mg (1 mg Intravenous Given 01/21/16 1310)  cefTRIAXone (ROCEPHIN) 1 g in dextrose 5 % 50 mL IVPB (0 g Intravenous Stopped 01/21/16 1420)  sodium chloride 0.9 % bolus 1,000 mL (0 mLs Intravenous Stopped 01/21/16 1702)  Influenza vac split quadrivalent PF (FLUARIX) injection 0.5 mL (0.5 mLs Intramuscular Given 01/22/16 0856)  pneumococcal 23 valent vaccine (PNU-IMMUNE) injection 0.5 mL (0.5 mLs Intramuscular Given 01/22/16 0855)     Initial Impression / Assessment and Plan / ED Course  I have reviewed the triage vital signs and the nursing notes.  Pertinent labs & imaging results that were available during my care of the patient were reviewed by me and considered in my medical decision making (see chart for details).  Clinical Course  2:56 PM  d/w Dr. Wynetta Emery for admission to hospitalist service.    Pt presenting with somnolence and pin point pupils- responded to narcan briefly then back to sleep.  She initially denies taking pain meds, then stated she took 1 vicodin.  She also has evidence of UTI- started on IV rocephin.  Her presentation is c/w combination of narcotics and UTI- pt admitted for further management.    Final Clinical Impressions(s) / ED Diagnoses   Final diagnoses:  None    New Prescriptions Current Discharge Medication List       Alfonzo Beers, MD 01/22/16 1158

## 2016-01-21 NOTE — ED Notes (Signed)
Pt awakes when spoken to but falls asleep quickly again. Pt denies pain and sts that she has not been feeling well for 1.5 weeks.

## 2016-01-22 LAB — COMPREHENSIVE METABOLIC PANEL
ALBUMIN: 3.5 g/dL (ref 3.5–5.0)
ALK PHOS: 66 U/L (ref 38–126)
ALT: 15 U/L (ref 14–54)
ANION GAP: 4 — AB (ref 5–15)
AST: 16 U/L (ref 15–41)
BUN: 12 mg/dL (ref 6–20)
CALCIUM: 8.9 mg/dL (ref 8.9–10.3)
CO2: 25 mmol/L (ref 22–32)
Chloride: 112 mmol/L — ABNORMAL HIGH (ref 101–111)
Creatinine, Ser: 0.97 mg/dL (ref 0.44–1.00)
GFR calc non Af Amer: >60 mL/min (ref >60)
GLUCOSE: 116 mg/dL — AB (ref 65–99)
POTASSIUM: 3.4 mmol/L — AB (ref 3.5–5.1)
SODIUM: 141 mmol/L (ref 135–145)
TOTAL PROTEIN: 6.4 g/dL — AB (ref 6.5–8.1)
Total Bilirubin: 0.3 mg/dL (ref 0.3–1.2)

## 2016-01-22 LAB — GLUCOSE, CAPILLARY
GLUCOSE-CAPILLARY: 97 mg/dL (ref 65–99)
GLUCOSE-CAPILLARY: 103 mg/dL — AB (ref 65–99)
GLUCOSE-CAPILLARY: 186 mg/dL — AB (ref 65–99)
Glucose-Capillary: 103 mg/dL — ABNORMAL HIGH (ref 65–99)

## 2016-01-22 LAB — CBC
HEMATOCRIT: 37.9 % (ref 36.0–46.0)
HEMOGLOBIN: 12.5 g/dL (ref 12.0–15.0)
MCH: 31.3 pg (ref 26.0–34.0)
MCHC: 33 g/dL (ref 30.0–36.0)
MCV: 94.8 fL (ref 78.0–100.0)
Platelets: 278 10*3/uL (ref 150–400)
RBC: 4 MIL/uL (ref 3.87–5.11)
RDW: 13.6 % (ref 11.5–15.5)
WBC: 9.6 10*3/uL (ref 4.0–10.5)

## 2016-01-22 LAB — TROPONIN I

## 2016-01-22 NOTE — Progress Notes (Signed)
Patient ID: Kelli Jenkins, female   DOB: Jan 30, 1961, 55 y.o.   MRN: [id number]  PROGRESS NOTE    Kelli Jenkins  [id number] DOB: 07/29/1960 DOA: 01/21/2016  PCP: No PCP Per Patient   Brief Narrative:  55 y.o. female with past medical history of polysubstance abuse who presented to [facility] ED with ongoing malaise and not feeling well over past week or so piror to this admission. She reported not use recreational substances but when she presented to ED she was lethargic and was subsequently given narcan and she started to improve. She was also found to have UTI.   Assessment & Plan:   Active Problems:  E.Coli UTI (lower urinary tract infection) / Leukocytosis - Urine culture on admission growing E.Coli - Continue Rocephin for now  Hypokalemia - Unclear etiology - Repeat BMP in am  Opioid overdose and cocaine abuse  - UDS positive for opiates and cocaine  - Has required several doses of naloxone in ED - Counseled on drug abuse    DVT prophylaxis: Lovenox subQ  Code Status: full code  Family Communication: family at the bedside this am Disposition Plan: home likely in am    Consultants:   None   Procedures:   None   Antimicrobials:   Rocephin 01/21/2016 -->    Subjective: No overnight events.   Objective: Vitals:   01/21/16 1720 01/21/16 2224 01/22/16 0454 01/22/16 1503  BP: 122/69 138/68 137/68 136/84  Pulse: 76 82 83 85  Resp: 20 (!) 22 20 20   Temp:  98.4 F (36.9 C) 98 F (36.7 C) 97.8 F (36.6 C)  TempSrc:  Oral Oral Oral  SpO2: 95% 98% 98% 98%  Weight:      Height:        Intake/Output Summary (Last 24 hours) at 01/22/16 2005 Last data filed at 01/22/16 1525  Gross per 24 hour  Intake             3150 ml  Output                0 ml  Net             3150 ml   Filed Weights   01/21/16 1050  Weight: 86.2 kg (190 lb)    Examination:  General exam: Appears calm and comfortable  Respiratory system: Clear to auscultation. Respiratory effort  normal. Cardiovascular system: S1 & S2 heard, RRR. No JVD, murmurs, rubs, gallops or clicks. No pedal edema. Gastrointestinal system: Abdomen is nondistended, soft and nontender. No organomegaly or masses felt. Normal bowel sounds heard. Central nervous system: Alert and oriented. No focal neurological deficits. Extremities: Symmetric 5 x 5 power. Skin: No rashes, lesions or ulcers Psychiatry: Judgement and insight appear normal. Mood & affect appropriate.   Data Reviewed: I have personally reviewed following labs and imaging studies  CBC:  Recent Labs Lab 01/21/16 1145 01/21/16 1524 01/22/16 0302  WBC 11.6* 11.2* 9.6  HGB 14.1 12.9 12.5  HCT 42.8 38.3 37.9  MCV 96.0 92.3 94.8  PLT 343 310 0000000   Basic Metabolic Panel:  Recent Labs Lab 01/21/16 1145 01/21/16 1524 01/22/16 0302  NA 141  --  141  K 3.7  --  3.4*  CL 107  --  112*  CO2 26  --  25  GLUCOSE 198*  --  116*  BUN 13  --  12  CREATININE 1.00 0.84 0.97  CALCIUM 9.3  --  8.9   GFR: Estimated Creatinine Clearance:  74.4 mL/min (by C-G formula based on SCr of 0.97 mg/dL). Liver Function Tests:  Recent Labs Lab 01/22/16 0302  AST 16  ALT 15  ALKPHOS 66  BILITOT 0.3  PROT 6.4*  ALBUMIN 3.5   No results for input(s): LIPASE, AMYLASE in the last 168 hours. No results for input(s): AMMONIA in the last 168 hours. Coagulation Profile: No results for input(s): INR, PROTIME in the last 168 hours. Cardiac Enzymes:  Recent Labs Lab 01/21/16 1524 01/21/16 2000 01/22/16 0302  TROPONINI <0.03 <0.03 <0.03   BNP (last 3 results) No results for input(s): PROBNP in the last 8760 hours. HbA1C: No results for input(s): HGBA1C in the last 72 hours. CBG:  Recent Labs Lab 01/21/16 1843 01/21/16 2222 01/22/16 0733 01/22/16 1200 01/22/16 1624  GLUCAP 91 89 97 103* 186*   Lipid Profile: No results for input(s): CHOL, HDL, LDLCALC, TRIG, CHOLHDL, LDLDIRECT in the last 72 hours. Thyroid Function  Tests:  Recent Labs  01/21/16 1524  TSH 0.581   Anemia Panel: No results for input(s): VITAMINB12, FOLATE, FERRITIN, TIBC, IRON, RETICCTPCT in the last 72 hours. Urine analysis:    Component Value Date/Time   COLORURINE YELLOW 01/21/2016 1255   APPEARANCEUR CLOUDY (A) 01/21/2016 1255   LABSPEC 1.018 01/21/2016 1255   PHURINE 6.5 01/21/2016 1255   GLUCOSEU NEGATIVE 01/21/2016 1255   HGBUR SMALL (A) 01/21/2016 1255   BILIRUBINUR NEGATIVE 01/21/2016 1255   KETONESUR NEGATIVE 01/21/2016 1255   PROTEINUR NEGATIVE 01/21/2016 1255   UROBILINOGEN 1.0 03/04/2015 1904   NITRITE POSITIVE (A) 01/21/2016 1255   LEUKOCYTESUR SMALL (A) 01/21/2016 1255   Sepsis Labs: @LABRCNTIP (procalcitonin:4,lacticidven:4)   Recent Results (from the past 240 hour(s))  Urine culture     Status: Abnormal (Preliminary result)   Collection Time: 01/21/16 12:50 PM  Result Value Ref Range Status   Specimen Description URINE, CATHETERIZED  Final   Special Requests NONE  Final   Culture >=100,000 COLONIES/mL ESCHERICHIA COLI (A)  Final   Report Status PENDING  Incomplete  Blood culture (routine x 2)     Status: None (Preliminary result)   Collection Time: 01/21/16  2:20 PM  Result Value Ref Range Status   Specimen Description BLOOD LEFT ARM  10 ML IN [facility] BOTTLE  Final   Special Requests NONE  Final   Culture   Final    NO GROWTH 1 DAY Performed at [facility]    Report Status PENDING  Incomplete  Blood culture (routine x 2)     Status: None (Preliminary result)   Collection Time: 01/21/16  2:30 PM  Result Value Ref Range Status   Specimen Description BLOOD LEFT HAND  10 ML IN [facility] BOTTLE  Final   Special Requests NONE  Final   Culture   Final    NO GROWTH 1 DAY Performed at [facility]    Report Status PENDING  Incomplete      Radiology Studies: Dg Chest 2 View Result Date: 01/21/2016 No active cardiopulmonary disease.   Scheduled Meds: . cefTRIAXone (ROCEPHIN)  IV  1 g  Intravenous Q24H  . enoxaparin (LOVENOX) injection  40 mg Subcutaneous Q24H  . famotidine  20 mg Oral BID  . insulin aspart  0-9 Units Subcutaneous TID WC  . sodium chloride flush  3 mL Intravenous Q12H   Continuous Infusions:    LOS: 0 days    Time spent: 15 minutes  Greater than 50% of the time spent on counseling and coordinating the care.  Leisa Lenz, MD [facility] Pager [phone]  If 7PM-7AM, please contact night-coverage [email or web address] Password [email or web address] 01/22/2016, 8:05 PM

## 2016-01-23 DIAGNOSIS — B9620 Unspecified Escherichia coli [E. coli] as the cause of diseases classified elsewhere: Secondary | ICD-10-CM

## 2016-01-23 LAB — BASIC METABOLIC PANEL
Anion gap: 9 (ref 5–15)
BUN: 11 mg/dL (ref 6–20)
CALCIUM: 9.5 mg/dL (ref 8.9–10.3)
CHLORIDE: 105 mmol/L (ref 101–111)
CO2: 26 mmol/L (ref 22–32)
CREATININE: 0.95 mg/dL (ref 0.44–1.00)
GFR calc Af Amer: >60 mL/min (ref >60)
GFR calc non Af Amer: >60 mL/min (ref >60)
GLUCOSE: 113 mg/dL — AB (ref 65–99)
Potassium: 3.8 mmol/L (ref 3.5–5.1)
Sodium: 140 mmol/L (ref 135–145)

## 2016-01-23 LAB — GLUCOSE, CAPILLARY
GLUCOSE-CAPILLARY: 133 mg/dL — AB (ref 65–99)
Glucose-Capillary: 149 mg/dL — ABNORMAL HIGH (ref 65–99)
Glucose-Capillary: 115 mg/dL — ABNORMAL HIGH (ref 65–99)

## 2016-01-23 LAB — CBC
HCT: 40.8 % (ref 36.0–46.0)
Hemoglobin: 13.5 g/dL (ref 12.0–15.0)
MCH: 31 pg (ref 26.0–34.0)
MCHC: 33.1 g/dL (ref 30.0–36.0)
MCV: 93.6 fL (ref 78.0–100.0)
PLATELETS: 291 10*3/uL (ref 150–400)
RBC: 4.36 MIL/uL (ref 3.87–5.11)
RDW: 13.3 % (ref 11.5–15.5)
WBC: 13.2 10*3/uL — ABNORMAL HIGH (ref 4.0–10.5)

## 2016-01-23 LAB — URINE CULTURE: Culture: >=100000 — AB

## 2016-01-23 MED ORDER — CIPROFLOXACIN HCL 500 MG PO TABS: 500.0000 mg | ORAL_TABLET | Freq: Two times a day (BID) | ORAL | 0 refills | Status: DC

## 2016-01-23 NOTE — Progress Notes (Signed)
Discharge instructions reviewed. Upon dc review, pt stated she is unable to afford Cipro. CM contacted regarding post dc f/u for [place] and wellness. Medication confirmed for [facility] $4 list

## 2016-01-23 NOTE — Plan of Care (Signed)
Problem: Safety: Goal: Ability to remain free from injury will improve Outcome: Completed/Met Date Met: 01/23/16 Bed alarm set. Safety precaution/prevention discussed. Pt verbalized understading and is agreeable to call for assistance

## 2016-01-23 NOTE — Discharge Summary (Signed)
Physician Discharge Summary  Kelli Jenkins [id number] DOB: [birth year] DOA: 01/21/2016  PCP: No PCP Per Patient  Admit date: 01/21/2016 Discharge date: 01/23/2016  Recommendations for Outpatient Follow-up:  1. Continue cipro for 5 days n discharge   Discharge Diagnoses:  Active Problems:   Leukocytosis   UTI (lower urinary tract infection)   Opioid overdose   Somnolence   Cocaine abuse   Opioid abuse    Discharge Condition: stable   Diet recommendation: as tolerated   History of present illness:  55 y.o.female with past medical history of polysubstance abuse who presented to [facility] ED with ongoing malaise and not feeling well over past week or so piror to this admission. She reported not use recreational substances but when she presented to ED she was lethargic and was subsequently given narcan and she started to improve. She was also found to have UTI.   Hospital Course:   Assessment & Plan:   Active Problems:  E.Coli UTI (lower urinary tract infection) / Leukocytosis - Urine culture on admission growing E.Coli - Stop Rocephin - Continue Cipro for 5 days on discharge   Hypokalemia - Unclear etiology - Now WNL  Opioid overdose and cocaine abuse  - UDS positive for opiates and cocaine  - Has required several doses of naloxone in ED - Counseled on drug abuse    DVT prophylaxis: Lovenox subQ  Code Status: full code  Family Communication: family at the bedside this am    Consultants:   None   Procedures:   None   Antimicrobials:   Rocephin 01/21/2016 --> 01/23/2016   Signed:  Leisa Lenz, MD  [facility] 01/23/2016, 4:58 PM  Pager #: [phone]  Time spent in minutes: less than 30 minutes   Discharge Exam: Vitals:   01/23/16 0544 01/23/16 1444  BP: 136/64 111/65  Pulse: 97 72  Resp: 20 19  Temp: 99 F (37.2 C) 98.2 F (36.8 C)   Vitals:   01/22/16 1503 01/22/16 2057 01/23/16 0544 01/23/16 1444  BP: 136/84 130/73  136/64 111/65  Pulse: 85 79 97 72  Resp: 20 18 20 19   Temp: 97.8 F (36.6 C) 98.3 F (36.8 C) 99 F (37.2 C) 98.2 F (36.8 C)  TempSrc: Oral Oral Oral Oral  SpO2: 98% 98% 97% 97%  Weight:      Height:        General: Pt is alert, follows commands appropriately, not in acute distress Cardiovascular: Regular rate and rhythm, S1/S2 + Respiratory: Clear to auscultation bilaterally, no wheezing, no crackles, no rhonchi Abdominal: Soft, non tender, non distended, bowel sounds +, no guarding Extremities: no edema, no cyanosis, pulses palpable bilaterally DP and PT Neuro: Grossly nonfocal  Discharge Instructions  Discharge Instructions    Call MD for:  persistant nausea and vomiting    Complete by:  As directed    Call MD for:  redness, tenderness, or signs of infection (pain, swelling, redness, odor or green/yellow discharge around incision site)    Complete by:  As directed    Call MD for:  severe uncontrolled pain    Complete by:  As directed    Diet - low sodium heart healthy    Complete by:  As directed    Discharge instructions    Complete by:  As directed    Continue cipro for 5 days on discharge   Increase activity slowly    Complete by:  As directed        Medication List  STOP taking these medications   omeprazole 20 MG capsule Commonly known as:  PRILOSEC     TAKE these medications   ciprofloxacin 500 MG tablet Commonly known as:  CIPRO Take 1 tablet (500 mg total) by mouth 2 (two) times daily.   ranitidine 150 MG tablet Commonly known as:  ZANTAC Take 150 mg by mouth 2 (two) times daily.         The results of significant diagnostics from this hospitalization (including imaging, microbiology, ancillary and laboratory) are listed below for reference.    Significant Diagnostic Studies: Dg Chest 2 View  Result Date: 01/21/2016 CLINICAL DATA:  Cough for several weeks EXAM: CHEST  2 VIEW COMPARISON:  March 04, 2015 FINDINGS: The heart size and  mediastinal contours are within normal limits. Both lungs are clear. The visualized skeletal structures are unremarkable. IMPRESSION: No active cardiopulmonary disease. Electronically Signed   By: Dorise Bullion III M.D   On: 01/21/2016 12:39    Microbiology: Recent Results (from the past 240 hour(s))  Urine culture     Status: Abnormal   Collection Time: 01/21/16 12:50 PM  Result Value Ref Range Status   Specimen Description URINE, CATHETERIZED  Final   Special Requests NONE  Final   Culture >=100,000 COLONIES/mL ESCHERICHIA COLI (A)  Final   Report Status 01/23/2016 FINAL  Final   Organism ID, Bacteria ESCHERICHIA COLI (A)  Final      Susceptibility   Escherichia coli - MIC*    AMPICILLIN <=2 SENSITIVE Sensitive     CEFAZOLIN <=4 SENSITIVE Sensitive     CEFTRIAXONE <=1 SENSITIVE Sensitive     CIPROFLOXACIN <=0.25 SENSITIVE Sensitive     GENTAMICIN <=1 SENSITIVE Sensitive     IMIPENEM <=0.25 SENSITIVE Sensitive     NITROFURANTOIN <=16 SENSITIVE Sensitive     TRIMETH/SULFA <=20 SENSITIVE Sensitive     AMPICILLIN/SULBACTAM <=2 SENSITIVE Sensitive     PIP/TAZO <=4 SENSITIVE Sensitive     Extended ESBL NEGATIVE Sensitive     * >=100,000 COLONIES/mL ESCHERICHIA COLI  Blood culture (routine x 2)     Status: None (Preliminary result)   Collection Time: 01/21/16  2:20 PM  Result Value Ref Range Status   Specimen Description BLOOD LEFT ARM  10 ML IN [facility] BOTTLE  Final   Special Requests NONE  Final   Culture   Final    NO GROWTH 2 DAYS Performed at [facility]    Report Status PENDING  Incomplete  Blood culture (routine x 2)     Status: None (Preliminary result)   Collection Time: 01/21/16  2:30 PM  Result Value Ref Range Status   Specimen Description BLOOD LEFT HAND  10 ML IN [facility] BOTTLE  Final   Special Requests NONE  Final   Culture   Final    NO GROWTH 2 DAYS Performed at [facility]    Report Status PENDING  Incomplete     Labs: Basic Metabolic  Panel:  Recent Labs Lab 01/21/16 1145 01/21/16 1524 01/22/16 0302 01/23/16 0436  NA 141  --  141 140  K 3.7  --  3.4* 3.8  CL 107  --  112* 105  CO2 26  --  25 26  GLUCOSE 198*  --  116* 113*  BUN 13  --  12 11  CREATININE 1.00 0.84 0.97 0.95  CALCIUM 9.3  --  8.9 9.5   Liver Function Tests:  Recent Labs Lab 01/22/16 0302  AST 16  ALT  15  ALKPHOS 66  BILITOT 0.3  PROT 6.4*  ALBUMIN 3.5   No results for input(s): LIPASE, AMYLASE in the last 168 hours. No results for input(s): AMMONIA in the last 168 hours. CBC:  Recent Labs Lab 01/21/16 1145 01/21/16 1524 01/22/16 0302 01/23/16 0436  WBC 11.6* 11.2* 9.6 13.2*  HGB 14.1 12.9 12.5 13.5  HCT 42.8 38.3 37.9 40.8  MCV 96.0 92.3 94.8 93.6  PLT 343 310 278 291   Cardiac Enzymes:  Recent Labs Lab 01/21/16 1524 01/21/16 2000 01/22/16 0302  TROPONINI <0.03 <0.03 <0.03   BNP: BNP (last 3 results)  Recent Labs  01/21/16 1145  BNP 29.4    ProBNP (last 3 results) No results for input(s): PROBNP in the last 8760 hours.  CBG:  Recent Labs Lab 01/22/16 1200 01/22/16 1624 01/22/16 2050 01/23/16 0744 01/23/16 1155  GLUCAP 103* 186* 103* 133* 149*

## 2016-01-23 NOTE — Evaluation (Signed)
Physical Therapy Evaluation Patient Details Name: Kelli Jenkins MRN: [id number] DOB: 12-11-1960 Today's Date: 01/23/2016   History of Present Illness  55 yo female admitted with opioid overdose. Hx of polysubstance abuse.   Clinical Impression  On eval, pt was Min guard assist for mobility. She walked ~75 feet. Pt was unsteady at times. She tends to move quickly. Distance limited by pt feeling as if she would vomit. Pt repeatedly stated " I can walk..I just don't feel good". Husband present during session. He stated he could help pt as needed. Recommend daily ambulation in hallway with supervision, as tolerated. Do not anticipate any follow up needs. Will follow and progress activity as tolerated.     Follow Up Recommendations No PT follow up;Supervision for mobility/OOB    Equipment Recommendations  None recommended by PT    Recommendations for Other Services       Precautions / Restrictions Precautions Precautions: Fall Restrictions Weight Bearing Restrictions: No      Mobility  Bed Mobility Overal bed mobility: Independent                Transfers Overall transfer level: Independent                  Ambulation/Gait Ambulation/Gait assistance: Min guard Ambulation Distance (Feet): 75 Feet Assistive device: None Gait Pattern/deviations: Step-through pattern;Decreased stride length     General Gait Details: Unsteady at times. very close guard for safety. Pt c/o nausea, dizziness, and feeling weak. Limited distance due to pt stating she felt like she was going to vomit.   Stairs            Wheelchair Mobility    Modified Rankin (Stroke Patients Only)       Balance Overall balance assessment: Needs assistance           Standing balance-Leahy Scale: Fair                               Pertinent Vitals/Pain Pain Assessment: No/denies pain    Home Living Family/patient expects to be discharged to:: Private residence Living  Arrangements: Spouse/significant other             Home Equipment: None      Prior Function Level of Independence: Independent               Hand Dominance        Extremity/Trunk Assessment   Upper Extremity Assessment: Overall WFL for tasks assessed           Lower Extremity Assessment: Generalized weakness      Cervical / Trunk Assessment: Normal  Communication   Communication: No difficulties  Cognition Arousal/Alertness: Awake/alert Behavior During Therapy: WFL for tasks assessed/performed Overall Cognitive Status: Within Functional Limits for tasks assessed                      General Comments      Exercises     Assessment/Plan    PT Assessment Patient needs continued PT services  PT Problem List Decreased mobility;Decreased activity tolerance          PT Treatment Interventions Gait training;Therapeutic activities;Therapeutic exercise;Balance training;Functional mobility training    PT Goals (Current goals can be found in the Care Plan section)  Acute Rehab PT Goals Patient Stated Goal: to feel better PT Goal Formulation: With patient/family Time For Goal Achievement: 02/06/16 Potential to Achieve Goals: Good  Frequency Min 3X/week   Barriers to discharge        Co-evaluation               End of Session Equipment Utilized During Treatment: Gait belt Activity Tolerance:  (Limited by nausea) Patient left: in bed;with call bell/phone within reach;with family/visitor present;with bed alarm set           Time: [id number] PT Time Calculation (min) (ACUTE ONLY): 8 min   Charges:   PT Evaluation $PT Eval Low Complexity: 1 Procedure     PT G Codes:        Weston Anna, MPT Pager: [phone]

## 2016-01-23 NOTE — Plan of Care (Signed)
Problem: Education: Goal: Knowledge of [facility] Education information/materials will improve Outcome: Progressing Plan of care discussed with pt. Will continue to provide education

## 2016-01-23 NOTE — Discharge Instructions (Signed)

## 2016-01-26 LAB — CULTURE, BLOOD (ROUTINE X 2)
CULTURE: NO GROWTH
CULTURE: NO GROWTH

## 2016-04-03 ENCOUNTER — Encounter (HOSPITAL_COMMUNITY): Payer: Self-pay | Admitting: Emergency Medicine

## 2016-04-03 ENCOUNTER — Emergency Department (HOSPITAL_COMMUNITY)
Admission: EM | Admit: 2016-04-03 | Discharge: 2016-04-03 | Discharge status: Against Medical Advice | Payer: Self-pay | Attending: Emergency Medicine | Admitting: Emergency Medicine

## 2016-04-03 ENCOUNTER — Emergency Department (HOSPITAL_COMMUNITY): Payer: Self-pay

## 2016-04-03 DIAGNOSIS — F17210 Nicotine dependence, cigarettes, uncomplicated: Secondary | ICD-10-CM | POA: Insufficient documentation

## 2016-04-03 DIAGNOSIS — Z79899 Other long term (current) drug therapy: Secondary | ICD-10-CM | POA: Insufficient documentation

## 2016-04-03 DIAGNOSIS — J40 Bronchitis, not specified as acute or chronic: Secondary | ICD-10-CM | POA: Insufficient documentation

## 2016-04-03 DIAGNOSIS — Z8542 Personal history of malignant neoplasm of other parts of uterus: Secondary | ICD-10-CM | POA: Insufficient documentation

## 2016-04-03 DIAGNOSIS — H9212 Otorrhea, left ear: Secondary | ICD-10-CM | POA: Insufficient documentation

## 2016-04-03 LAB — BASIC METABOLIC PANEL
ANION GAP: 7 (ref 5–15)
BUN: 19 mg/dL (ref 6–20)
CALCIUM: 8.6 mg/dL — AB (ref 8.9–10.3)
CO2: 26 mmol/L (ref 22–32)
CREATININE: 1.21 mg/dL — AB (ref 0.44–1.00)
Chloride: 103 mmol/L (ref 101–111)
GFR calc Af Amer: 57 mL/min — ABNORMAL LOW (ref >60)
GFR, EST NON AFRICAN AMERICAN: 49 mL/min — AB (ref >60)
GLUCOSE: 135 mg/dL — AB (ref 65–99)
Potassium: 4 mmol/L (ref 3.5–5.1)
Sodium: 136 mmol/L (ref 135–145)

## 2016-04-03 LAB — CBC WITH DIFFERENTIAL/PLATELET
BASOS ABS: 0 10*3/uL (ref 0.0–0.1)
BASOS PCT: 0 %
EOS ABS: 0.1 10*3/uL (ref 0.0–0.7)
Eosinophils Relative: 0 %
HCT: 37.7 % (ref 36.0–46.0)
Hemoglobin: 13.1 g/dL (ref 12.0–15.0)
Lymphocytes Relative: 15 %
Lymphs Abs: 2.8 10*3/uL (ref 0.7–4.0)
MCH: 31.6 pg (ref 26.0–34.0)
MCHC: 34.7 g/dL (ref 30.0–36.0)
MCV: 91.1 fL (ref 78.0–100.0)
MONO ABS: 1.1 10*3/uL — AB (ref 0.1–1.0)
MONOS PCT: 6 %
Neutro Abs: 15 10*3/uL — ABNORMAL HIGH (ref 1.7–7.7)
Neutrophils Relative %: 79 %
PLATELETS: 326 10*3/uL (ref 150–400)
RBC: 4.14 MIL/uL (ref 3.87–5.11)
RDW: 13.7 % (ref 11.5–15.5)
WBC: 19.1 10*3/uL — ABNORMAL HIGH (ref 4.0–10.5)

## 2016-04-03 MED ORDER — METHYLPREDNISOLONE SODIUM SUCC 125 MG IJ SOLR
125.0000 mg | Freq: Once | INTRAMUSCULAR | Status: AC
  Administered 2016-04-03: 125 mg via INTRAVENOUS
  Filled 2016-04-03: qty 2

## 2016-04-03 MED ORDER — SODIUM CHLORIDE 0.9 % IV BOLUS (SEPSIS)
500.0000 mL | Freq: Once | INTRAVENOUS | Status: AC
  Administered 2016-04-03: 500 mL via INTRAVENOUS

## 2016-04-03 MED ORDER — AZITHROMYCIN 250 MG PO TABS
500.0000 mg | ORAL_TABLET | Freq: Once | ORAL | Status: AC
  Administered 2016-04-03: 500 mg via ORAL
  Filled 2016-04-03: qty 2

## 2016-04-03 MED ORDER — ALBUTEROL SULFATE HFA 108 (90 BASE) MCG/ACT IN AERS: 2.0000 | INHALATION_SPRAY | RESPIRATORY_TRACT | Status: DC | PRN

## 2016-04-03 MED ORDER — ACETAMINOPHEN 500 MG PO TABS
1000.0000 mg | ORAL_TABLET | Freq: Once | ORAL | Status: AC
  Administered 2016-04-03: 1000 mg via ORAL
  Filled 2016-04-03: qty 2

## 2016-04-03 MED ORDER — IPRATROPIUM-ALBUTEROL 0.5-2.5 (3) MG/3ML IN SOLN
3.0000 mL | Freq: Once | RESPIRATORY_TRACT | Status: AC
  Administered 2016-04-03: 3 mL via RESPIRATORY_TRACT
  Filled 2016-04-03: qty 3

## 2016-04-03 MED ORDER — ALBUTEROL SULFATE (2.5 MG/3ML) 0.083% IN NEBU
5.0000 mg | INHALATION_SOLUTION | Freq: Once | RESPIRATORY_TRACT | Status: AC
  Administered 2016-04-03: 5 mg via RESPIRATORY_TRACT
  Filled 2016-04-03: qty 6

## 2016-04-03 MED ORDER — PREDNISONE 20 MG PO TABS: ORAL_TABLET | ORAL | 0 refills | Status: DC

## 2016-04-03 MED ORDER — AZITHROMYCIN 250 MG PO TABS: 250.0000 mg | ORAL_TABLET | Freq: Every day | ORAL | 0 refills | Status: DC

## 2016-04-03 NOTE — ED Notes (Signed)
ED Provider at bedside. 

## 2016-04-03 NOTE — ED Notes (Signed)
Respiratory called for breathing treatment.

## 2016-04-03 NOTE — ED Notes (Signed)
Patient called from lobby. No answer.

## 2016-04-03 NOTE — ED Notes (Signed)
Called from lobby to take back to treatment room. No answer from lobby

## 2016-04-03 NOTE — ED Triage Notes (Signed)
Patient c/o cough with yellow/green phlegm, chest pain that is worse with coughing, and left ear pain.

## 2016-04-03 NOTE — ED Provider Notes (Addendum)
[facility] DEPT Provider Note   CSN: [id number] Arrival date & time: 04/03/16  1553     History   Chief Complaint Chief Complaint  Patient presents with  . Chest Pain  . Cough  . Otalgia    HPI Kelli Jenkins is a 55 y.o. female.  Patient is a 55 year old female with a history of prior substance abuse. She presents with cough and wheezing. She reports a 1-1/2 week history of nasal congestion and cough which is productive of yellow sputum. She is sore across the chest from coughing. She has subjective fever and chills. She has associated shortness of breath. She has left ear pain and drainage as well. She denies any nausea or vomiting. No diarrhea. No leg swelling. She's been using over-the-counter medications without improvement in symptoms.      Past Medical History:  Diagnosis Date  . Cancer ([facility])    uterine  . Cocaine addiction ([facility])   . Hypokalemia   . Nephrolithiasis   . Pyelonephritis   . SIRS (systemic inflammatory response syndrome) ([facility])   . Tobacco abuse   . UTI (urinary tract infection) 06/2013    Patient Active Problem List   Diagnosis Date Noted  . Opioid overdose 01/21/2016  . Somnolence 01/21/2016  . Cocaine abuse 01/21/2016  . Opioid abuse 01/21/2016  . UTI (lower urinary tract infection) 07/13/2013  . HCAP (healthcare-associated pneumonia) 08/16/2011  . Migraine 08/15/2011  . SIRS (systemic inflammatory response syndrome) ([facility]) 08/13/2011  . Pyelonephritis 08/12/2011  . Ureteral colic [id number]  . Hypotension 08/12/2011  . Fever 08/12/2011  . Nephrolithiasis 08/12/2011  . Leukocytosis 08/12/2011  . Tobacco use disorder 08/12/2011    Past Surgical History:  Procedure Laterality Date  . RENAL ARTERY STENT     stones  . TUBAL LIGATION    . VAGINAL HYSTERECTOMY      OB History    No data available       Home Medications    Prior to Admission medications   Medication Sig Start Date End Date Taking? Authorizing Provider    azithromycin (ZITHROMAX) 250 MG tablet Take 1 tablet (250 mg total) by mouth daily. 04/03/16   Malvin Johns, MD  ciprofloxacin (CIPRO) 500 MG tablet Take 1 tablet (500 mg total) by mouth 2 (two) times daily. 01/23/16   Robbie Lis, MD  predniSONE (DELTASONE) 20 MG tablet 2 tabs po daily x 4 days 04/03/16   Malvin Johns, MD  ranitidine (ZANTAC) 150 MG tablet Take 150 mg by mouth 2 (two) times daily.    Historical Provider, MD    Family History No family history on file.  Social History Social History  Substance Use Topics  . Smoking status: Current Every Day Smoker    Packs/day: 1.00    Types: Cigarettes  . Smokeless tobacco: Never Used  . Alcohol use Yes     Comment: occasionally     Allergies   Darvocet [propoxyphene n-acetaminophen]; Ibuprofen; and Tramadol   Review of Systems Review of Systems  Constitutional: Positive for appetite change, chills, fatigue and fever. Negative for diaphoresis.  HENT: Positive for congestion, ear pain and rhinorrhea. Negative for sneezing.   Eyes: Negative.   Respiratory: Positive for cough. Negative for chest tightness and shortness of breath.   Cardiovascular: Positive for chest pain. Negative for leg swelling.  Gastrointestinal: Negative for abdominal pain, blood in stool, diarrhea, nausea and vomiting.  Genitourinary: Negative for difficulty urinating, flank pain, frequency and hematuria.  Musculoskeletal: Positive for myalgias. Negative  for arthralgias and back pain.  Skin: Negative for rash.  Neurological: Negative for dizziness, speech difficulty, weakness, numbness and headaches.     Physical Exam Updated Vital Signs BP 125/58 (BP Location: Left Arm)   Pulse 100   Temp 98.5 F (36.9 C) (Oral)   Resp 20   SpO2 95%   Physical Exam  Constitutional: She is oriented to person, place, and time. She appears well-developed and well-nourished.  HENT:  Head: Normocephalic and atraumatic.  Right Ear: External ear normal.  Left  Ear: There is drainage (White drainage). Tympanic membrane is perforated.  Mouth/Throat: Oropharynx is clear and moist. No oropharyngeal exudate.  Eyes: Pupils are equal, round, and reactive to light.  Neck: Normal range of motion. Neck supple.  Cardiovascular: Normal rate, regular rhythm and normal heart sounds.   Pulmonary/Chest: Effort normal. No respiratory distress. She has wheezes. She has rhonchi. She has no rales. She exhibits no tenderness.  Abdominal: Soft. Bowel sounds are normal. There is no tenderness. There is no rebound and no guarding.  Musculoskeletal: Normal range of motion. She exhibits no edema.  Lymphadenopathy:    She has no cervical adenopathy.  Neurological: She is alert and oriented to person, place, and time.  Skin: Skin is warm and dry. No rash noted.  Psychiatric: She has a normal mood and affect.     ED Treatments / Results  Labs (all labs ordered are listed, but only abnormal results are displayed) Labs Reviewed  BASIC METABOLIC PANEL - Abnormal; Notable for the following:       Result Value   Glucose, Bld 135 (*)    Creatinine, Ser 1.21 (*)    Calcium 8.6 (*)    GFR calc non Af Amer 49 (*)    GFR calc Af Amer 57 (*)    All other components within normal limits  CBC WITH DIFFERENTIAL/PLATELET - Abnormal; Notable for the following:    WBC 19.1 (*)    Neutro Abs 15.0 (*)    Monocytes Absolute 1.1 (*)    All other components within normal limits    EKG  EKG Interpretation  Date/Time:  Tuesday April 03 2016 19:31:45 EST Ventricular Rate:  86 PR Interval:    QRS Duration: 79 QT Interval:  351 QTC Calculation: 420 R Axis:   83 Text Interpretation:  Sinus rhythm since last tracing no significant change Confirmed by Caty Tessler  MD, Zoiee Wimmer ([id number]) on 04/04/2016 12:06:31 AM       Radiology Dg Chest 2 View  Result Date: 04/03/2016 CLINICAL DATA:  55 year old female with history of chest pain, productive cough, fever, weakness and chills for 1  week. EXAM: CHEST  2 VIEW COMPARISON:  Chest x-ray 01/21/2016. FINDINGS: Lung volumes are normal. No consolidative airspace disease. No pleural effusions. No pneumothorax. No pulmonary nodule or mass noted. Pulmonary vasculature and the cardiomediastinal silhouette are within normal limits. IMPRESSION: No radiographic evidence of acute cardiopulmonary disease. Electronically Signed   By: Vinnie Langton M.D.   On: 04/03/2016 19:49    Procedures Procedures (including critical care time)  Medications Ordered in ED Medications  albuterol (PROVENTIL HFA;VENTOLIN HFA) 108 (90 Base) MCG/ACT inhaler 2 puff (not administered)  sodium chloride 0.9 % bolus 500 mL (0 mLs Intravenous Stopped 04/03/16 2041)  methylPREDNISolone sodium succinate (SOLU-MEDROL) 125 mg/2 mL injection 125 mg (125 mg Intravenous Given 04/03/16 1909)  ipratropium-albuterol (DUONEB) 0.5-2.5 (3) MG/3ML nebulizer solution 3 mL (3 mLs Nebulization Given 04/03/16 2041)  acetaminophen (TYLENOL) tablet 1,000 mg (1,000 mg  Oral Given 04/03/16 1902)  albuterol (PROVENTIL) (2.5 MG/3ML) 0.083% nebulizer solution 5 mg (5 mg Nebulization Given 04/03/16 2145)  azithromycin (ZITHROMAX) tablet 500 mg (500 mg Oral Given 04/03/16 2253)     Initial Impression / Assessment and Plan / ED Course  I have reviewed the triage vital signs and the nursing notes.  Pertinent labs & imaging results that were available during my care of the patient were reviewed by me and considered in my medical decision making (see chart for details).  Clinical Course    Patient presents with cough and wheezing. There is no evidence of pneumonia on x-ray. She's afebrile. She was tachycardic and very wheezy on exam. She had 2 nebulizer treatments here with marked improvement in symptoms. Her lungs have cleared. She has trace residual wheezing. But no increased work of breathing. Her heart rate has improved into the 80s. It at times will go back up into the low 100s but then  it does go back down to the 80s. She has no persistent tachycardia or hypoxia that would be more concerning for pulmonary embolus. She's able to ambulate without hypoxia. She was dispensed an albuterol inhaler to use at home. She was also started on Zithromax and prednisone. She was discharged home in good condition. She was encouraged to establish care with a PCP. Her creatinine is mildly elevated which will need outpatient follow-up. Strict return precautions were given.   Final Clinical Impressions(s) / ED Diagnoses   Final diagnoses:  Bronchitis    New Prescriptions Discharge Medication List as of 04/03/2016 10:32 PM    START taking these medications   Details  azithromycin (ZITHROMAX) 250 MG tablet Take 1 tablet (250 mg total) by mouth daily., Starting Tue 04/03/2016, Print    predniSONE (DELTASONE) 20 MG tablet 2 tabs po daily x 4 days, Print         Malvin Johns, MD 04/03/16 [id number]    Malvin Johns, MD 04/04/16 0006

## 2016-04-03 NOTE — Progress Notes (Signed)
EDCM went to speak to patient at bedside, however patient not at bedside at this time.

## 2016-04-06 ENCOUNTER — Encounter (HOSPITAL_COMMUNITY): Payer: Self-pay | Admitting: Emergency Medicine

## 2016-04-06 ENCOUNTER — Emergency Department (HOSPITAL_COMMUNITY): Payer: Self-pay

## 2016-04-06 ENCOUNTER — Emergency Department (HOSPITAL_COMMUNITY)
Admission: EM | Admit: 2016-04-06 | Discharge: 2016-04-06 | Disposition: A | Discharge status: Home / Self Care | Payer: Self-pay | Attending: Emergency Medicine | Admitting: Emergency Medicine

## 2016-04-06 DIAGNOSIS — R059 Cough, unspecified: Secondary | ICD-10-CM

## 2016-04-06 DIAGNOSIS — Z79899 Other long term (current) drug therapy: Secondary | ICD-10-CM | POA: Insufficient documentation

## 2016-04-06 DIAGNOSIS — J069 Acute upper respiratory infection, unspecified: Secondary | ICD-10-CM | POA: Insufficient documentation

## 2016-04-06 DIAGNOSIS — F17210 Nicotine dependence, cigarettes, uncomplicated: Secondary | ICD-10-CM | POA: Insufficient documentation

## 2016-04-06 DIAGNOSIS — N3000 Acute cystitis without hematuria: Secondary | ICD-10-CM | POA: Insufficient documentation

## 2016-04-06 DIAGNOSIS — R05 Cough: Secondary | ICD-10-CM

## 2016-04-06 DIAGNOSIS — Z8541 Personal history of malignant neoplasm of cervix uteri: Secondary | ICD-10-CM | POA: Insufficient documentation

## 2016-04-06 DIAGNOSIS — R300 Dysuria: Secondary | ICD-10-CM

## 2016-04-06 LAB — URINALYSIS, ROUTINE W REFLEX MICROSCOPIC
Bilirubin Urine: NEGATIVE
Glucose, UA: 50 mg/dL — AB
KETONES UR: NEGATIVE mg/dL
Leukocytes, UA: NEGATIVE
Nitrite: POSITIVE — AB
PH: 5 (ref 5.0–8.0)
PROTEIN: 30 mg/dL — AB
Specific Gravity, Urine: 1.023 (ref 1.005–1.030)

## 2016-04-06 MED ORDER — IPRATROPIUM-ALBUTEROL 0.5-2.5 (3) MG/3ML IN SOLN
3.0000 mL | RESPIRATORY_TRACT | Status: DC
  Administered 2016-04-06: 3 mL via RESPIRATORY_TRACT
  Filled 2016-04-06: qty 3

## 2016-04-06 MED ORDER — HYDROCODONE-HOMATROPINE 5-1.5 MG/5ML PO SYRP
5.0000 mL | ORAL_SOLUTION | Freq: Once | ORAL | Status: AC
  Administered 2016-04-06: 5 mL via ORAL
  Filled 2016-04-06: qty 5

## 2016-04-06 MED ORDER — HYDROCODONE-HOMATROPINE 5-1.5 MG/5ML PO SYRP: 5.0000 mL | ORAL_SOLUTION | Freq: Four times a day (QID) | ORAL | 0 refills | Status: DC | PRN

## 2016-04-06 MED ORDER — CEPHALEXIN 500 MG PO CAPS: ORAL_CAPSULE | ORAL | 0 refills | Status: DC

## 2016-04-06 MED ORDER — CEPHALEXIN 500 MG PO CAPS
1000.0000 mg | ORAL_CAPSULE | Freq: Once | ORAL | Status: AC
  Administered 2016-04-06: 1000 mg via ORAL
  Filled 2016-04-06: qty 2

## 2016-04-06 MED ORDER — ALBUTEROL SULFATE HFA 108 (90 BASE) MCG/ACT IN AERS
2.0000 | INHALATION_SPRAY | Freq: Once | RESPIRATORY_TRACT | Status: AC
  Administered 2016-04-06: 2 via RESPIRATORY_TRACT
  Filled 2016-04-06: qty 6.7

## 2016-04-06 NOTE — ED Notes (Signed)
Pt given warm blanket and lights dimmed while awaiting provider assessment.

## 2016-04-06 NOTE — ED Notes (Signed)
[place] [employer] consultant attempt to give medication as ordered. Pt not in room. [place], and this Probation officer unable to locate pt throughout department.

## 2016-04-06 NOTE — ED Notes (Signed)
Pt verbalizes urine is orange related to taking OTC AZO for symptom management.

## 2016-04-06 NOTE — ED Notes (Signed)
Attempt to ask pt if able to provide urine sample. Pt not in room.

## 2016-04-06 NOTE — ED Triage Notes (Signed)
Pt complaint of continued productive cough and otalgia since treatment 04/03/2016. Pt complaint of new dysuria with little output onset yesterday morning.

## 2016-04-06 NOTE — ED Provider Notes (Signed)
[facility] DEPT Provider Note   CSN: [id number] Arrival date & time: 04/06/16  [id number]     History   Chief Complaint Chief Complaint  Patient presents with  . Cough  . Otalgia  . Dysuria    HPI Kelli Jenkins is a 55 y.o. female.  Patient is approximately 1.5 weeks total of progressively worsening cough. Some productive some not. She was seen here 3 days ago and started on antibiotics, steroids, albuterol inhaler. The cough hasn't improved much since that time and she also has some left ear drainage. She states that she coughs so hard that she will urinate. She started with sore throat and a sore upper chest secondary to the coughing. Some fevers. Some dysuria and urgency. No other associated symptoms. No chest related factors. She does have history of smoking lesions smoking last couple days. No sick contacts. Did have the flu vaccine and pneumonia vaccine this year.    Cough  Associated symptoms include ear pain.  Otalgia  Associated symptoms include cough.  Dysuria      Past Medical History:  Diagnosis Date  . Cancer ([facility])    uterine  . Cocaine addiction ([facility])   . Hypokalemia   . Nephrolithiasis   . Pyelonephritis   . SIRS (systemic inflammatory response syndrome) ([facility])   . Tobacco abuse   . UTI (urinary tract infection) 06/2013    Patient Active Problem List   Diagnosis Date Noted  . Opioid overdose 01/21/2016  . Somnolence 01/21/2016  . Cocaine abuse 01/21/2016  . Opioid abuse 01/21/2016  . UTI (lower urinary tract infection) 07/13/2013  . HCAP (healthcare-associated pneumonia) 08/16/2011  . Migraine 08/15/2011  . SIRS (systemic inflammatory response syndrome) ([facility]) 08/13/2011  . Pyelonephritis 08/12/2011  . Ureteral colic [id number]  . Hypotension 08/12/2011  . Fever 08/12/2011  . Nephrolithiasis 08/12/2011  . Leukocytosis 08/12/2011  . Tobacco use disorder 08/12/2011    Past Surgical History:  Procedure Laterality Date  . RENAL ARTERY STENT      stones  . TUBAL LIGATION    . VAGINAL HYSTERECTOMY      OB History    No data available       Home Medications    Prior to Admission medications   Medication Sig Start Date End Date Taking? Authorizing Provider  azithromycin (ZITHROMAX) 250 MG tablet Take 1 tablet (250 mg total) by mouth daily. 04/03/16  Yes Malvin Johns, MD  predniSONE (DELTASONE) 20 MG tablet 2 tabs po daily x 4 days 04/03/16  Yes Malvin Johns, MD  cephALEXin (KEFLEX) 500 MG capsule 2 caps po bid x 7 days 04/06/16   Merrily Pew, MD  HYDROcodone-homatropine [facility]) 5-1.5 MG/5ML syrup Take 5 mLs by mouth every 6 (six) hours as needed for cough. 04/06/16   Merrily Pew, MD    Family History No family history on file.  Social History Social History  Substance Use Topics  . Smoking status: Current Every Day Smoker    Packs/day: 1.00    Types: Cigarettes  . Smokeless tobacco: Never Used  . Alcohol use Yes     Comment: occasionally     Allergies   Darvocet [propoxyphene n-acetaminophen]; Ibuprofen; and Tramadol   Review of Systems Review of Systems  HENT: Positive for ear pain.   Respiratory: Positive for cough.   Genitourinary: Positive for dysuria.  All other systems reviewed and are negative.    Physical Exam Updated Vital Signs BP (!) 139/54 (BP Location: Left Arm)   Pulse 81  Temp 97.4 F (36.3 C) (Oral)   Resp 20   Ht 5\' 8"  (1.727 m)   Wt 200 lb (90.7 kg)   SpO2 94%   BMI 30.41 kg/m   Physical Exam  Constitutional: She is oriented to person, place, and time. She appears well-developed and well-nourished.  HENT:  Head: Normocephalic and atraumatic.  Eyes: Conjunctivae and EOM are normal.  Neck: Normal range of motion.  Cardiovascular: Normal rate and regular rhythm.   Pulmonary/Chest: No stridor. No respiratory distress. She has wheezes.  Abdominal: Soft. She exhibits no distension. There is no tenderness.  Musculoskeletal: Normal range of motion. She exhibits no edema  or deformity.  Neurological: She is alert and oriented to person, place, and time. No cranial nerve deficit.  Skin: Skin is warm and dry.  Nursing note and vitals reviewed.    ED Treatments / Results  Labs (all labs ordered are listed, but only abnormal results are displayed) Labs Reviewed  URINALYSIS, ROUTINE W REFLEX MICROSCOPIC - Abnormal; Notable for the following:       Result Value   Color, Urine RED (*)    Glucose, UA 50 (*)    Hgb urine dipstick SMALL (*)    Protein, ur 30 (*)    Nitrite POSITIVE (*)    Bacteria, UA MANY (*)    Squamous Epithelial / LPF 0-5 (*)    All other components within normal limits  URINE CULTURE    EKG  EKG Interpretation None       Radiology Dg Chest 2 View  Result Date: 04/06/2016 CLINICAL DATA:  Productive cough. EXAM: CHEST  2 VIEW COMPARISON:  04/03/2016. FINDINGS: Trachea is midline. Heart size normal. Lungs are clear. No pleural fluid. IMPRESSION: No acute findings. Electronically Signed   By: Lorin Picket M.D.   On: 04/06/2016 10:19    Procedures Procedures (including critical care time)   Counseled patient for approximately 7 minutes regarding smoking cessation. Discussed risks of smoking and how they applied and affected their visit here today. Patient not ready to quit at this time, however will follow up with their primary doctor when they are.   CPT code: [phone]: intermediate counseling for smoking cessation  Medications Ordered in ED Medications  ipratropium-albuterol (DUONEB) 0.5-2.5 (3) MG/3ML nebulizer solution 3 mL (3 mLs Nebulization Given 04/06/16 1000)  HYDROcodone-homatropine (HYCODAN) 5-1.5 MG/5ML syrup 5 mL (5 mLs Oral Given 04/06/16 1000)  cephALEXin (KEFLEX) capsule 1,000 mg (1,000 mg Oral Given 04/06/16 1023)     Initial Impression / Assessment and Plan / ED Course  I have reviewed the triage vital signs and the nursing notes.  Pertinent labs & imaging results that were available during my care of  the patient were reviewed by me and considered in my medical decision making (see chart for details).  Clinical Course     Patient with likely continue bronchitis made worse by her 20-some-pack-year smoking history. Artery on antibodies, steroids will also give her another inhaler. We'll give her different cough medicine. Low suspicion for pneumonia, pertussis or fluid this time. Also the urinary tract infection without evidence of complication. Plan for Keflex for 7 days and follow-up with her primary doctor for repeat urine to ensure is improved. Urine culture added on.  Final Clinical Impressions(s) / ED Diagnoses   Final diagnoses:  Upper respiratory tract infection, unspecified type  Acute cystitis without hematuria  Cough  Dysuria    New Prescriptions New Prescriptions   CEPHALEXIN (KEFLEX) 500 MG CAPSULE  2 caps po bid x 7 days   HYDROCODONE-HOMATROPINE (HYCODAN) 5-1.5 MG/5ML SYRUP    Take 5 mLs by mouth every 6 (six) hours as needed for cough.     Merrily Pew, MD 04/06/16 [phone]

## 2016-04-06 NOTE — ED Notes (Signed)
Pt walks up to nurse's station verbalizing was in restroom. Discharge completed at present time.

## 2016-04-08 LAB — URINE CULTURE

## 2016-04-09 ENCOUNTER — Telehealth (HOSPITAL_BASED_OUTPATIENT_CLINIC_OR_DEPARTMENT_OTHER): Payer: Self-pay | Admitting: Emergency Medicine

## 2016-04-09 NOTE — Telephone Encounter (Signed)
Post ED Visit - Positive Culture Follow-up  Culture report reviewed by antimicrobial stewardship pharmacist:  [x]  Elenor Quinones, Pharm.D. []  Heide Guile, Pharm.D., BCPS []  Parks Neptune, Pharm.D. []  Alycia Rossetti, Pharm.D., BCPS []  Marvin, Pharm.D., BCPS, AAHIVP []  Legrand Como, Pharm.D., BCPS, AAHIVP []  Milus Glazier, Pharm.D. []  Stephens November, Pharm.D.  Positive urine culture Treated with cephalexin, organism sensitive to the same and no further patient follow-up is required at this time.  Hazle Nordmann 04/09/2016, 10:11 AM

## 2016-04-22 ENCOUNTER — Emergency Department (HOSPITAL_COMMUNITY): Payer: No Typology Code available for payment source

## 2016-04-22 ENCOUNTER — Encounter (HOSPITAL_COMMUNITY): Payer: Self-pay | Admitting: Emergency Medicine

## 2016-04-22 ENCOUNTER — Emergency Department (HOSPITAL_COMMUNITY)
Admission: EM | Admit: 2016-04-22 | Discharge: 2016-04-22 | Disposition: A | Discharge status: Home / Self Care | Payer: No Typology Code available for payment source | Attending: Emergency Medicine | Admitting: Emergency Medicine

## 2016-04-22 DIAGNOSIS — R531 Weakness: Secondary | ICD-10-CM | POA: Insufficient documentation

## 2016-04-22 DIAGNOSIS — R05 Cough: Secondary | ICD-10-CM

## 2016-04-22 DIAGNOSIS — F17210 Nicotine dependence, cigarettes, uncomplicated: Secondary | ICD-10-CM | POA: Insufficient documentation

## 2016-04-22 DIAGNOSIS — J181 Lobar pneumonia, unspecified organism: Secondary | ICD-10-CM | POA: Insufficient documentation

## 2016-04-22 DIAGNOSIS — R059 Cough, unspecified: Secondary | ICD-10-CM

## 2016-04-22 DIAGNOSIS — J189 Pneumonia, unspecified organism: Secondary | ICD-10-CM

## 2016-04-22 DIAGNOSIS — Z79899 Other long term (current) drug therapy: Secondary | ICD-10-CM | POA: Insufficient documentation

## 2016-04-22 DIAGNOSIS — Z8542 Personal history of malignant neoplasm of other parts of uterus: Secondary | ICD-10-CM | POA: Insufficient documentation

## 2016-04-22 LAB — CBC WITH DIFFERENTIAL/PLATELET
Basophils Absolute: 0 10*3/uL (ref 0.0–0.1)
Basophils Relative: 0 %
EOS PCT: 1 %
Eosinophils Absolute: 0.1 10*3/uL (ref 0.0–0.7)
HCT: 34.5 % — ABNORMAL LOW (ref 36.0–46.0)
Hemoglobin: 11.4 g/dL — ABNORMAL LOW (ref 12.0–15.0)
LYMPHS PCT: 12 %
Lymphs Abs: 1.6 10*3/uL (ref 0.7–4.0)
MCH: 30.3 pg (ref 26.0–34.0)
MCHC: 33 g/dL (ref 30.0–36.0)
MCV: 91.8 fL (ref 78.0–100.0)
MONO ABS: 0.8 10*3/uL (ref 0.1–1.0)
Monocytes Relative: 6 %
Neutro Abs: 10.8 10*3/uL — ABNORMAL HIGH (ref 1.7–7.7)
Neutrophils Relative %: 81 %
PLATELETS: 309 10*3/uL (ref 150–400)
RBC: 3.76 MIL/uL — ABNORMAL LOW (ref 3.87–5.11)
RDW: 13.8 % (ref 11.5–15.5)
WBC: 13.3 10*3/uL — ABNORMAL HIGH (ref 4.0–10.5)

## 2016-04-22 LAB — I-STAT TROPONIN, ED: Troponin i, poc: 0 ng/mL (ref 0.00–0.08)

## 2016-04-22 LAB — BASIC METABOLIC PANEL
Anion gap: 10 (ref 5–15)
BUN: 17 mg/dL (ref 6–20)
CALCIUM: 8.8 mg/dL — AB (ref 8.9–10.3)
CO2: 27 mmol/L (ref 22–32)
CREATININE: 0.81 mg/dL (ref 0.44–1.00)
Chloride: 98 mmol/L — ABNORMAL LOW (ref 101–111)
GFR calc Af Amer: >60 mL/min (ref >60)
GLUCOSE: 127 mg/dL — AB (ref 65–99)
Potassium: 3.5 mmol/L (ref 3.5–5.1)
Sodium: 135 mmol/L (ref 135–145)

## 2016-04-22 MED ORDER — BENZONATATE 100 MG PO CAPS
100.0000 mg | ORAL_CAPSULE | Freq: Once | ORAL | Status: AC
  Administered 2016-04-22: 100 mg via ORAL
  Filled 2016-04-22: qty 1

## 2016-04-22 MED ORDER — SODIUM CHLORIDE 0.9 % IV BOLUS (SEPSIS)
1000.0000 mL | Freq: Once | INTRAVENOUS | Status: AC
  Administered 2016-04-22: 1000 mL via INTRAVENOUS

## 2016-04-22 MED ORDER — ACETAMINOPHEN 325 MG PO TABS
650.0000 mg | ORAL_TABLET | Freq: Once | ORAL | Status: AC
  Administered 2016-04-22: 650 mg via ORAL
  Filled 2016-04-22: qty 2

## 2016-04-22 MED ORDER — LEVOFLOXACIN 750 MG PO TABS: 750.0000 mg | ORAL_TABLET | Freq: Every day | ORAL | 0 refills | Status: DC

## 2016-04-22 MED ORDER — BENZONATATE 100 MG PO CAPS: 100.0000 mg | ORAL_CAPSULE | Freq: Three times a day (TID) | ORAL | 0 refills | Status: DC

## 2016-04-22 NOTE — ED Notes (Signed)
IV removed by patient. Awaiting case management to call back to discuss medications with patient.

## 2016-04-22 NOTE — ED Notes (Signed)
Patient transported to X-ray 

## 2016-04-22 NOTE — ED Notes (Signed)
Per patient she is unable to give urine specimen, will let us know when she is able to

## 2016-04-22 NOTE — Discharge Instructions (Signed)
Take the prescribed medication as directed.  Make sure to rest and drink fluids. Follow-up with the [facility]-- call to make appt. Return to the ED for new or worsening symptoms.

## 2016-04-22 NOTE — Progress Notes (Signed)
CSW spoke with patient at bedside regarding housing resources. CSW provided patient with homeless resources handout. CSW inquired if patient had any questions or required any other resources, patient replied no. CSW inquired about patient's transportation, patient reported that her brother was picking her up. CSW encouraged patient to follow up with resources.

## 2016-04-22 NOTE — ED Triage Notes (Signed)
Pt states flu like symptoms x 6 days. Pt give antibiotics, pt unsure what abx were prescribed for, pt states she has worsening weakness, night sweats and cough.

## 2016-04-22 NOTE — Care Management (Addendum)
Provided information on [facility] for pt to call for an appointment. Pt has [facility] orange card to assist with MD appts and medications. Provided pt with goodrx coupons for $7.00 at [facility] for both medications.  Jonnie Finner RN CCM Case Mgmt phone [phone]

## 2016-04-22 NOTE — ED Notes (Signed)
Per patient she is unable to give urine specimen, will let us know when she is able to. Patient is receiving IV fluids and given Ginger Ale.

## 2016-04-22 NOTE — ED Provider Notes (Signed)
[facility] DEPT Provider Note   CSN: [id number] Arrival date & time: 04/22/16  1023     History   Chief Complaint Chief Complaint  Patient presents with  . Fatigue  . Fever  . Generalized Body Aches    HPI Kelli Jenkins is a 55 y.o. female.  The history is provided by the patient and medical records.  Fever      55 y.o. F with hx of uterine cancer s/p hysterectomy, nephrolithiasis, hx of UTI's, presenting to the ED for Cough, body aches, congestion, and generalized weakness. Reports his been ongoing for the past 2 weeks. He was seen in the ED twice thus far for the same. Patient completed a z-pack without improvement.  States she continues to have productive cough with "nasty" colored sputum. She reports some chest pain and shortness of breath, mostly with coughing. She's not had any hemoptysis. Reports subjective fever and chills. States she was able to eat a peanut butter sandwich 2 days ago, has had limited oral intake since this time. She denies any abdominal pain, nausea, vomiting, or diarrhea. States she's not had any sick contacts. Patient continues to smoke regularly. She has no known cardiac history. Was previously having some UTI type symptoms but after finishing course of keflex, this resolved. Patient reports she does not have a primary care doctor in which to follow-up with.    Past Medical History:  Diagnosis Date  . Cancer ([place])    uterine  . Cocaine addiction ([place])   . Hypokalemia   . Nephrolithiasis   . Pyelonephritis   . SIRS (systemic inflammatory response syndrome) (HCC)   . Tobacco abuse   . UTI (urinary tract infection) 06/2013    Patient Active Problem List   Diagnosis Date Noted  . Opioid overdose 01/21/2016  . Somnolence 01/21/2016  . Cocaine abuse 01/21/2016  . Opioid abuse 01/21/2016  . UTI (lower urinary tract infection) 07/13/2013  . HCAP (healthcare-associated pneumonia) 08/16/2011  . Migraine 08/15/2011  . SIRS (systemic  inflammatory response syndrome) ([place]) 08/13/2011  . Pyelonephritis 08/12/2011  . Ureteral colic 46/56/8127  . Hypotension 08/12/2011  . Fever 08/12/2011  . Nephrolithiasis 08/12/2011  . Leukocytosis 08/12/2011  . Tobacco use disorder 08/12/2011    Past Surgical History:  Procedure Laterality Date  . RENAL ARTERY STENT     stones  . TUBAL LIGATION    . VAGINAL HYSTERECTOMY      OB History    No data available       Home Medications    Prior to Admission medications   Medication Sig Start Date End Date Taking? Authorizing Provider  azithromycin (ZITHROMAX) 250 MG tablet Take 1 tablet (250 mg total) by mouth daily. 04/03/16   Malvin Johns, MD  cephALEXin (KEFLEX) 500 MG capsule 2 caps po bid x 7 days 04/06/16   Merrily Pew, MD  HYDROcodone-homatropine [facility]) 5-1.5 MG/5ML syrup Take 5 mLs by mouth every 6 (six) hours as needed for cough. 04/06/16   Merrily Pew, MD  predniSONE (DELTASONE) 20 MG tablet 2 tabs po daily x 4 days 04/03/16   Malvin Johns, MD    Family History History reviewed. No pertinent family history.  Social History Social History  Substance Use Topics  . Smoking status: Current Every Day Smoker    Packs/day: 1.00    Types: Cigarettes  . Smokeless tobacco: Never Used  . Alcohol use Yes     Comment: occasionally     Allergies   Darvocet [propoxyphene n-acetaminophen];  Ibuprofen; and Tramadol   Review of Systems Review of Systems  Constitutional: Positive for fever.  All other systems reviewed and are negative.    Physical Exam Updated Vital Signs BP 109/96 (BP Location: Right Arm)   Pulse 100   Temp 98.8 F (37.1 C) (Oral)   Resp 20   SpO2 95%   Physical Exam  Constitutional: She is oriented to person, place, and time. She appears well-developed and well-nourished.  Laying in bed beneath 5-6 blankets, reports she is still cold  HENT:  Head: Normocephalic and atraumatic.  Right Ear: Tympanic membrane and ear canal normal.    Left Ear: Tympanic membrane and ear canal normal.  Nose: Mucosal edema present.  Mouth/Throat: Uvula is midline and mucous membranes are normal. Posterior oropharyngeal erythema present.  + nasal congestion, mildly dry mucous membranes; mild posterior oropharyngeal erythema; tonsils normal in appearance bilaterally without exudates; no signs of PTA; normal phonation without stridor  Eyes: Conjunctivae and EOM are normal. Pupils are equal, round, and reactive to light.  Neck: Normal range of motion.  Cardiovascular: Normal rate, regular rhythm and normal heart sounds.   Pulmonary/Chest: Effort normal and breath sounds normal. No respiratory distress. She has no wheezes. She has no rhonchi.  Coarse breath sounds without overt wheezes or rhonchi  Abdominal: Soft. Bowel sounds are normal. There is no tenderness. There is no rebound.  Musculoskeletal: Normal range of motion.  Neurological: She is alert and oriented to person, place, and time.  Skin: Skin is warm and dry.  Psychiatric: She has a normal mood and affect.  Nursing note and vitals reviewed.    ED Treatments / Results  Labs (all labs ordered are listed, but only abnormal results are displayed) Labs Reviewed  CBC WITH DIFFERENTIAL/PLATELET - Abnormal; Notable for the following:       Result Value   WBC 13.3 (*)    RBC 3.76 (*)    Hemoglobin 11.4 (*)    HCT 34.5 (*)    Neutro Abs 10.8 (*)    All other components within normal limits  BASIC METABOLIC PANEL - Abnormal; Notable for the following:    Chloride 98 (*)    Glucose, Bld 127 (*)    Calcium 8.8 (*)    All other components within normal limits  I-STAT TROPOININ, ED    EKG  EKG Interpretation  Date/Time:  Sunday April 22 2016 12:38:45 EST Ventricular Rate:  112 PR Interval:    QRS Duration: 81 QT Interval:  353 QTC Calculation: 482 R Axis:   84 Text Interpretation:  Sinus tachycardia No significant change since last tracing Confirmed by Maryan Rued  MD,  Loree Fee ([id number]) on 04/22/2016 12:52:02 PM       Radiology Dg Chest 2 View  Result Date: 04/22/2016 CLINICAL DATA:  PT states she is having flu symptoms for 6 days. Also states pt is having a cough and weakness for 6 days. EXAM: CHEST  2 VIEW COMPARISON:  04/06/2016 FINDINGS: There is consolidation in the right lower lobe new since the prior exam, consistent with pneumonia. Remainder of the lungs is clear. Trace right pleural effusion. No left pleural effusion. No pneumothorax. Normal heart, mediastinum and hila. Skeletal structures are unremarkable. IMPRESSION: Right lower lobe pneumonia. Electronically Signed   By: Lajean Manes M.D.   On: 04/22/2016 12:02    Procedures Procedures (including critical care time)  Medications Ordered in ED Medications - No data to display   Initial Impression / Assessment and Plan / ED  Course  I have reviewed the triage vital signs and the nursing notes.  Pertinent labs & imaging results that were available during my care of the patient were reviewed by me and considered in my medical decision making (see chart for details).  Clinical Course    55 y.o. F here with continued cough, fatigue, and generalized weakness for about 2 weeks now.  Seen in the ED twice for same, completed course of azithromycin for bronchitis as well as keflex for UTI.  States no improvement.  Patient here with low grade fever, non-toxic in appearance however. She does have productive cough and coarse breath sounds.  She is a daily smoker.  Work-up today with improved WBC count and normal electrolytes and cardiac enzyme, however new RLL pneumonia.  Her vitals remained stable here without any noted hypoxia. She has been up and ambulatory here in the ED without distress. States she has completed course of azithromycin already, will change to Levaquin. She is given Tessalon for cough. Case management has met with her, discussed with her how to arrange PCP follow-up as she has an orange  card and has also assisted with medication needs.  Discussed plan with patient, she acknowledged understanding and agreed with plan of care.  Return precautions given for new or worsening symptoms.  Final Clinical Impressions(s) / ED Diagnoses   Final diagnoses:  Community acquired pneumonia of right lower lobe of lung (HCC)  Cough  Generalized weakness    New Prescriptions New Prescriptions   BENZONATATE (TESSALON) 100 MG CAPSULE    Take 1 capsule (100 mg total) by mouth every 8 (eight) hours.   LEVOFLOXACIN (LEVAQUIN) 750 MG TABLET    Take 1 tablet (750 mg total) by mouth daily.     Larene Pickett, PA-C 04/22/16 1726    Blanchie Dessert, MD 04/23/16 [phone]

## 2016-05-03 ENCOUNTER — Inpatient Hospital Stay (HOSPITAL_COMMUNITY)
Admission: EM | Admit: 2016-05-03 | Discharge: 2016-05-06 | DRG: 871 | Disposition: A | Discharge status: Home / Self Care | Payer: No Typology Code available for payment source | Attending: Internal Medicine | Admitting: Internal Medicine

## 2016-05-03 ENCOUNTER — Emergency Department (HOSPITAL_COMMUNITY): Payer: No Typology Code available for payment source

## 2016-05-03 ENCOUNTER — Encounter (HOSPITAL_COMMUNITY): Payer: Self-pay

## 2016-05-03 DIAGNOSIS — F17210 Nicotine dependence, cigarettes, uncomplicated: Secondary | ICD-10-CM | POA: Diagnosis present

## 2016-05-03 DIAGNOSIS — E876 Hypokalemia: Secondary | ICD-10-CM | POA: Diagnosis present

## 2016-05-03 DIAGNOSIS — J189 Pneumonia, unspecified organism: Secondary | ICD-10-CM | POA: Diagnosis present

## 2016-05-03 DIAGNOSIS — J181 Lobar pneumonia, unspecified organism: Secondary | ICD-10-CM

## 2016-05-03 DIAGNOSIS — Z8542 Personal history of malignant neoplasm of other parts of uterus: Secondary | ICD-10-CM

## 2016-05-03 DIAGNOSIS — Z888 Allergy status to other drugs, medicaments and biological substances status: Secondary | ICD-10-CM

## 2016-05-03 DIAGNOSIS — A419 Sepsis, unspecified organism: Principal | ICD-10-CM | POA: Diagnosis present

## 2016-05-03 DIAGNOSIS — Z9119 Patient's noncompliance with other medical treatment and regimen: Secondary | ICD-10-CM

## 2016-05-03 DIAGNOSIS — D649 Anemia, unspecified: Secondary | ICD-10-CM | POA: Diagnosis present

## 2016-05-03 DIAGNOSIS — F17200 Nicotine dependence, unspecified, uncomplicated: Secondary | ICD-10-CM | POA: Diagnosis present

## 2016-05-03 DIAGNOSIS — N390 Urinary tract infection, site not specified: Secondary | ICD-10-CM | POA: Diagnosis present

## 2016-05-03 LAB — COMPREHENSIVE METABOLIC PANEL
ALT: 15 U/L (ref 14–54)
AST: 15 U/L (ref 15–41)
Albumin: 3.6 g/dL (ref 3.5–5.0)
Alkaline Phosphatase: 76 U/L (ref 38–126)
Anion gap: 8 (ref 5–15)
BILIRUBIN TOTAL: 0.6 mg/dL (ref 0.3–1.2)
BUN: 8 mg/dL (ref 6–20)
CO2: 23 mmol/L (ref 22–32)
CREATININE: 0.86 mg/dL (ref 0.44–1.00)
Calcium: 8.9 mg/dL (ref 8.9–10.3)
Chloride: 106 mmol/L (ref 101–111)
GFR calc Af Amer: >60 mL/min (ref >60)
GLUCOSE: 119 mg/dL — AB (ref 65–99)
Potassium: 3.9 mmol/L (ref 3.5–5.1)
Sodium: 137 mmol/L (ref 135–145)
TOTAL PROTEIN: 7 g/dL (ref 6.5–8.1)

## 2016-05-03 LAB — CBC WITH DIFFERENTIAL/PLATELET
BASOS ABS: 0.1 10*3/uL (ref 0.0–0.1)
Basophils Relative: 0 %
EOS PCT: 0 %
Eosinophils Absolute: 0.1 10*3/uL (ref 0.0–0.7)
HCT: 34.9 % — ABNORMAL LOW (ref 36.0–46.0)
Hemoglobin: 11.6 g/dL — ABNORMAL LOW (ref 12.0–15.0)
LYMPHS PCT: 9 %
Lymphs Abs: 2.1 10*3/uL (ref 0.7–4.0)
MCH: 30.3 pg (ref 26.0–34.0)
MCHC: 33.2 g/dL (ref 30.0–36.0)
MCV: 91.1 fL (ref 78.0–100.0)
MONO ABS: 1 10*3/uL (ref 0.1–1.0)
MONOS PCT: 5 %
Neutro Abs: 19.5 10*3/uL — ABNORMAL HIGH (ref 1.7–7.7)
Neutrophils Relative %: 86 %
PLATELETS: 347 10*3/uL (ref 150–400)
RBC: 3.83 MIL/uL — ABNORMAL LOW (ref 3.87–5.11)
RDW: 14.4 % (ref 11.5–15.5)
WBC: 22.7 10*3/uL — ABNORMAL HIGH (ref 4.0–10.5)

## 2016-05-03 LAB — PROTIME-INR
INR: 1.03
Prothrombin Time: 13.5 seconds (ref 11.4–15.2)

## 2016-05-03 LAB — URINALYSIS, ROUTINE W REFLEX MICROSCOPIC
Bilirubin Urine: NEGATIVE
GLUCOSE, UA: NEGATIVE mg/dL
KETONES UR: NEGATIVE mg/dL
Leukocytes, UA: NEGATIVE
Nitrite: POSITIVE — AB
PROTEIN: NEGATIVE mg/dL
Specific Gravity, Urine: 1.015 (ref 1.005–1.030)
pH: 7 (ref 5.0–8.0)

## 2016-05-03 LAB — RETICULOCYTES
RBC.: 3.73 MIL/uL — ABNORMAL LOW (ref 3.87–5.11)
RETIC COUNT ABSOLUTE: 63.4 10*3/uL (ref 19.0–186.0)
Retic Ct Pct: 1.7 % (ref 0.4–3.1)

## 2016-05-03 LAB — I-STAT CG4 LACTIC ACID, ED: LACTIC ACID, VENOUS: 0.86 mmol/L (ref 0.5–1.9)

## 2016-05-03 LAB — LACTIC ACID, PLASMA: Lactic Acid, Venous: 1 mmol/L (ref 0.5–1.9)

## 2016-05-03 MED ORDER — KETOROLAC TROMETHAMINE 15 MG/ML IJ SOLN: 15.0000 mg | Freq: Four times a day (QID) | INTRAMUSCULAR | Status: DC | PRN

## 2016-05-03 MED ORDER — NICOTINE 21 MG/24HR TD PT24
21.0000 mg | MEDICATED_PATCH | Freq: Every day | TRANSDERMAL | Status: DC
  Administered 2016-05-05 – 2016-05-06 (×2): 21 mg via TRANSDERMAL
  Filled 2016-05-03 (×3): qty 1

## 2016-05-03 MED ORDER — SODIUM CHLORIDE 0.9 % IV SOLN
INTRAVENOUS | Status: AC
  Administered 2016-05-03: 23:00:00 via INTRAVENOUS

## 2016-05-03 MED ORDER — ACETAMINOPHEN 325 MG PO TABS
650.0000 mg | ORAL_TABLET | Freq: Four times a day (QID) | ORAL | Status: DC | PRN
  Administered 2016-05-03 – 2016-05-04 (×2): 650 mg via ORAL
  Filled 2016-05-03 (×2): qty 2

## 2016-05-03 MED ORDER — LEVOFLOXACIN IN D5W 750 MG/150ML IV SOLN
750.0000 mg | Freq: Once | INTRAVENOUS | Status: AC
  Administered 2016-05-03: 750 mg via INTRAVENOUS
  Filled 2016-05-03: qty 150

## 2016-05-03 MED ORDER — KETOROLAC TROMETHAMINE 30 MG/ML IJ SOLN
30.0000 mg | Freq: Once | INTRAMUSCULAR | Status: AC
  Administered 2016-05-03: 30 mg via INTRAVENOUS
  Filled 2016-05-03: qty 1

## 2016-05-03 MED ORDER — DOXYCYCLINE HYCLATE 100 MG PO TABS
100.0000 mg | ORAL_TABLET | Freq: Two times a day (BID) | ORAL | Status: DC
  Administered 2016-05-04 – 2016-05-06 (×4): 100 mg via ORAL
  Filled 2016-05-03 (×4): qty 1

## 2016-05-03 MED ORDER — ENOXAPARIN SODIUM 40 MG/0.4ML ~~LOC~~ SOLN
40.0000 mg | SUBCUTANEOUS | Status: DC
  Administered 2016-05-03 – 2016-05-05 (×3): 40 mg via SUBCUTANEOUS
  Filled 2016-05-03 (×3): qty 0.4

## 2016-05-03 MED ORDER — SODIUM CHLORIDE 0.9 % IV BOLUS (SEPSIS)
1000.0000 mL | Freq: Once | INTRAVENOUS | Status: AC
  Administered 2016-05-03: 1000 mL via INTRAVENOUS

## 2016-05-03 MED ORDER — DEXTROSE 5 % IV SOLN
1.0000 g | INTRAVENOUS | Status: DC
  Administered 2016-05-04 – 2016-05-05 (×2): 1 g via INTRAVENOUS
  Filled 2016-05-03 (×2): qty 10

## 2016-05-03 NOTE — ED Triage Notes (Addendum)
Per EMS, Pt, from home, c/o cough, congestion, and generalized weakness x 1.5 weeks.  Pt reports that she was recently diagnosed w/ PNA(12/31), but was unable to afford her antibiotics.    Pt was seen on 12/31 for similar.

## 2016-05-03 NOTE — H&P (Signed)
History and Physical    SERGIO DUCHNOWSKI [id number] DOB: May 13, 1960 DOA: 05/03/2016  PCP: No PCP Per Patient   Patient coming from: Home  Chief Complaint: Cough, congestion, dyspnea, dysuria, gen weakness  HPI: Kelli Jenkins is a 56 y.o. female with medical history significant for uterine cancer status post hysterectomy, cocaine abuse, and tobacco abuse who presents to the emergency department with productive cough, dyspnea, dysuria, and generalized weakness going back more than 2 weeks. Patient reports that she had been in her usual state until the insidious development of rhinorrhea and sinus congestion more than 2 weeks ago. She reports that this was accompanied by sore throat and she soon developed dyspnea and cough as well. Patient was seen in the emergency department for these complaints on 04/22/2016, diagnosed with a pneumonia, and discharged home with Levaquin and prednisone. Patient reports that she was unable to afford her medications and hoped that she would recover on her own. She reports some initial improvement without any specific treatment, but notes that over the past several days she has began to worsen again. She has also developed dysuria in the past several days and has not attempted any interventions prior to coming in. She reports fevers, aches, and generalized weakness, but denies any chest pain, palpitations, change in vision or hearing, or loss of coordination.  ED Course: Upon arrival to the ED, patient is found to be febrile to 38 C, saturating adequately on room air, tachycardic low 100s, and with blood pressure 97/50. Chest x-ray demonstrates a persistent right lower lobe infiltrate, chemistry panel is unremarkable, and CBC is notable for a leukocytosis to [id number] with neutrophilic predominance. Urinalysis is notable for many bacteria, negative leukocyte, positive nitrites, and 6030 WBC. Moderate hemoglobin noted on UA. Lactic acid is reassuring at 0.86. Blood and  urine cultures were obtained in the emergency department and the patient was given a 1 L normal saline bolus. She was treated symptomatically with Toradol injection and Levaquin was started empirically for suspected pneumonia. Tachycardia resolved with fluid bolus and the patient's blood pressure improved. She will be observed on the telemetry unit for ongoing evaluation and management of suspected community-acquired pneumonia and UTI.  Review of Systems:  All other systems reviewed and apart from HPI, are negative.  Past Medical History:  Diagnosis Date  . Cancer ([place])    uterine  . Cocaine addiction ([place])   . Hypokalemia   . Nephrolithiasis   . Pyelonephritis   . SIRS (systemic inflammatory response syndrome) (HCC)   . Tobacco abuse   . UTI (urinary tract infection) 06/2013    Past Surgical History:  Procedure Laterality Date  . RENAL ARTERY STENT     stones  . TUBAL LIGATION    . VAGINAL HYSTERECTOMY       reports that she has been smoking Cigarettes.  She has been smoking about 1.00 pack per day. She has never used smokeless tobacco. She reports that she drinks alcohol. She reports that she does not use drugs.  Allergies  Allergen Reactions  . Darvocet [Propoxyphene N-Acetaminophen] Swelling  . Ibuprofen Swelling  . Tramadol Swelling    History reviewed. No pertinent family history.   Prior to Admission medications   Medication Sig Start Date End Date Taking? Authorizing Provider  azithromycin (ZITHROMAX) 250 MG tablet Take 1 tablet (250 mg total) by mouth daily. Patient not taking: Reported on 04/22/2016 04/03/16   Malvin Johns, MD  cephALEXin (KEFLEX) 500 MG capsule 2 caps po bid x  7 days Patient not taking: Reported on 05/03/2016 04/06/16   Merrily Pew, MD  HYDROcodone-homatropine [facility]) 5-1.5 MG/5ML syrup Take 5 mLs by mouth every 6 (six) hours as needed for cough. Patient not taking: Reported on 04/22/2016 04/06/16   Merrily Pew, MD  levofloxacin (LEVAQUIN)  750 MG tablet Take 1 tablet (750 mg total) by mouth daily. Patient not taking: Reported on 05/03/2016 04/22/16   Larene Pickett, PA-C  predniSONE (DELTASONE) 20 MG tablet 2 tabs po daily x 4 days Patient not taking: Reported on 05/03/2016 04/03/16   Malvin Johns, MD    Physical Exam: Vitals:   05/03/16 1808 05/03/16 1811 05/03/16 2028 05/03/16 2139  BP:  (!) 97/50 117/63 132/92  Pulse:  108 84 86  Resp:  20 25 21   Temp:  100.4 F (38 C)    TempSrc:  Oral    SpO2: 96% 98% 93% 96%      Constitutional: No acute respiratory distress; somnolent, easily woken Eyes: PERTLA, lids and conjunctivae normal ENMT: Mucous membranes are dry. Posterior pharynx clear of any exudate or lesions.   Neck: normal, supple, no masses, no thyromegaly Respiratory: Rhonchi at right base and mid-lung. Normal respiratory effort. No accessory muscle use.  Cardiovascular: Rate is ~110 and regular. No carotid bruits. No significant JVD. Abdomen: No distension, no tenderness, no masses palpated. Bowel sounds normal.  Musculoskeletal: no clubbing / cyanosis. No joint deformity upper and lower extremities. Normal muscle tone.  Skin: no significant rashes, lesions, ulcers. Warm, dry, well-perfused. Neurologic: CN 2-12 grossly intact. Sensation intact, DTR normal. Strength 5/5 in all 4 limbs.  Psychiatric: Somnolent, easily woken, oriented x 3.      Labs on Admission: I have personally reviewed following labs and imaging studies  CBC:  Recent Labs Lab 05/03/16 1856  WBC 22.7*  NEUTROABS 19.5*  HGB 11.6*  HCT 34.9*  MCV 91.1  PLT [id number]   Basic Metabolic Panel:  Recent Labs Lab 05/03/16 1856  NA 137  K 3.9  CL 106  CO2 23  GLUCOSE 119*  BUN 8  CREATININE 0.86  CALCIUM 8.9   GFR: CrCl cannot be calculated (Unknown ideal weight.). Liver Function Tests:  Recent Labs Lab 05/03/16 1856  AST 15  ALT 15  ALKPHOS 76  BILITOT 0.6  PROT 7.0  ALBUMIN 3.6   No results for input(s): LIPASE,  AMYLASE in the last 168 hours. No results for input(s): AMMONIA in the last 168 hours. Coagulation Profile:  Recent Labs Lab 05/03/16 1856  INR 1.03   Cardiac Enzymes: No results for input(s): CKTOTAL, CKMB, CKMBINDEX, TROPONINI in the last 168 hours. BNP (last 3 results) No results for input(s): PROBNP in the last 8760 hours. HbA1C: No results for input(s): HGBA1C in the last 72 hours. CBG: No results for input(s): GLUCAP in the last 168 hours. Lipid Profile: No results for input(s): CHOL, HDL, LDLCALC, TRIG, CHOLHDL, LDLDIRECT in the last 72 hours. Thyroid Function Tests: No results for input(s): TSH, T4TOTAL, FREET4, T3FREE, THYROIDAB in the last 72 hours. Anemia Panel: No results for input(s): VITAMINB12, FOLATE, FERRITIN, TIBC, IRON, RETICCTPCT in the last 72 hours. Urine analysis:    Component Value Date/Time   COLORURINE YELLOW 05/03/2016 1800   APPEARANCEUR HAZY (A) 05/03/2016 1800   LABSPEC 1.015 05/03/2016 1800   PHURINE 7.0 05/03/2016 1800   GLUCOSEU NEGATIVE 05/03/2016 1800   HGBUR MODERATE (A) 05/03/2016 1800   BILIRUBINUR NEGATIVE 05/03/2016 1800   KETONESUR NEGATIVE 05/03/2016 1800   PROTEINUR NEGATIVE 05/03/2016  1800   UROBILINOGEN 1.0 03/04/2015 1904   NITRITE POSITIVE (A) 05/03/2016 1800   LEUKOCYTESUR NEGATIVE 05/03/2016 1800   Sepsis Labs: @LABRCNTIP (procalcitonin:4,lacticidven:4) )No results found for this or any previous visit (from the past 240 hour(s)).   Radiological Exams on Admission: Dg Chest 2 View  Result Date: 05/03/2016 CLINICAL DATA:  Coughing congestion.  Generalized weakness. EXAM: CHEST  2 VIEW COMPARISON:  04/22/2016 FINDINGS: Two-view exam shows persistent but improved right lower lobe airspace disease. Left lung clear. The cardiopericardial silhouette is within normal limits for size. The visualized bony structures of the thorax are intact. IMPRESSION: Persistent but improved right lower lobe airspace disease, possibly with  superimposed atelectasis at this time. Electronically Signed   By: Misty Stanley M.D.   On: 05/03/2016 19:54    EKG: Not performed, will obtain as appropriate.   Assessment/Plan  1. Pneumonia, organism not yet identified  - Pt presents with productive cough, dyspnea, SIRS criteria, and infiltrate on CXR  - She had been prescribed Levaquin on 04/22/16, but reports she could not afford it; she had already completed a Z-pack at that time - BP is soft on presentation with mild tachycardia; lactate is reassuring at 0.86 and pt responded well to IVF  - Blood and urine cultures were obtained in ED, a 1 L NS bolus was given, and an empiric dose of Levaquin was given  - Check sputum culture, respiratory virus panel, and strep pneumo urine antigens - Continue empiric treatment with Rocephin and doxycycline pending cultures   2. UTI  - Pt presents with dysuria and UA is suggestive of infection - Urine sent for culture; prior urine cultures grew pan-sensitive E coli - Continue empiric abx as above pending cultures   3. Normocytic anemia  - Hgb 11.6 on admission, previously normal  - Pt denies melena or hematochezia - Check iron studies, B12, and folate; supplement as needed    4. Current smoker  - Counseled toward cessation  - Nicotine patch offered  - RN asked to provide smoking-cessation information prior to discharge     DVT prophylaxis: sq Lovenox  Code Status: Full  Family Communication: Discussed with patient Disposition Plan: Observe on telemetry Consults called: None Admission status: Observation    Vianne Bulls, MD [facility] Pager [phone]  If 7PM-7AM, please contact night-coverage [email or web address] Password [facility]  05/03/2016, 9:41 PM

## 2016-05-03 NOTE — ED Notes (Signed)
Documented in error pt. DG Chest 2 view.

## 2016-05-03 NOTE — ED Provider Notes (Signed)
[facility] DEPT Provider Note   CSN: [id number] Arrival date & time: 05/03/16  1804 By signing my name below, I, Dyke Brackett, attest that this documentation has been prepared under the direction and in the presence of non-physician practitioner, Shary Decamp, PA-C. Electronically Signed: Dyke Brackett, Scribe. 05/03/2016. 8:15 PM.   History   Chief Complaint Chief Complaint  Patient presents with  . URI  . Weakness    HPI Kelli Jenkins is a 56 y.o. female who presents to the Emergency Department complaining of persistent, moderate generalized body aches onset three weeks ago. She was dx with PNA on 123/31 and Rx Levaquin, but was unable to afford her medications. She reports associated fever, cough, congestion, back pain, abdominal pain, ear pain, sore throat, and generalized weakness. Sore throat is exacerbated by coughing and swallowing. Pt also reports dysuria. No treatments tried PTA. No alleviating factors noted. Pt has no other complaints at this time.   The history is provided by the patient. No language interpreter was used.   Past Medical History:  Diagnosis Date  . Cancer ([place])    uterine  . Cocaine addiction ([place])   . Hypokalemia   . Nephrolithiasis   . Pyelonephritis   . SIRS (systemic inflammatory response syndrome) ([facility])   . Tobacco abuse   . UTI (urinary tract infection) 06/2013    Patient Active Problem List   Diagnosis Date Noted  . Opioid overdose 01/21/2016  . Somnolence 01/21/2016  . Cocaine abuse 01/21/2016  . Opioid abuse 01/21/2016  . UTI (lower urinary tract infection) 07/13/2013  . HCAP (healthcare-associated pneumonia) 08/16/2011  . Migraine 08/15/2011  . SIRS (systemic inflammatory response syndrome) ([place]) 08/13/2011  . Pyelonephritis 08/12/2011  . Ureteral colic [id number]  . Hypotension 08/12/2011  . Fever 08/12/2011  . Nephrolithiasis 08/12/2011  . Leukocytosis 08/12/2011  . Tobacco use disorder 08/12/2011    Past Surgical  History:  Procedure Laterality Date  . RENAL ARTERY STENT     stones  . TUBAL LIGATION    . VAGINAL HYSTERECTOMY      OB History    No data available       Home Medications    Prior to Admission medications   Medication Sig Start Date End Date Taking? Authorizing Provider  azithromycin (ZITHROMAX) 250 MG tablet Take 1 tablet (250 mg total) by mouth daily. Patient not taking: Reported on 04/22/2016 04/03/16   Malvin Johns, MD  cephALEXin (KEFLEX) 500 MG capsule 2 caps po bid x 7 days Patient not taking: Reported on 05/03/2016 04/06/16   Merrily Pew, MD  HYDROcodone-homatropine [facility]) 5-1.5 MG/5ML syrup Take 5 mLs by mouth every 6 (six) hours as needed for cough. Patient not taking: Reported on 04/22/2016 04/06/16   Merrily Pew, MD  levofloxacin (LEVAQUIN) 750 MG tablet Take 1 tablet (750 mg total) by mouth daily. Patient not taking: Reported on 05/03/2016 04/22/16   Larene Pickett, PA-C  predniSONE (DELTASONE) 20 MG tablet 2 tabs po daily x 4 days Patient not taking: Reported on 05/03/2016 04/03/16   Malvin Johns, MD    Family History History reviewed. No pertinent family history.  Social History Social History  Substance Use Topics  . Smoking status: Current Every Day Smoker    Packs/day: 1.00    Types: Cigarettes  . Smokeless tobacco: Never Used  . Alcohol use Yes     Comment: occasionally     Allergies   Darvocet [propoxyphene n-acetaminophen]; Ibuprofen; and Tramadol   Review of Systems Review of  Systems ROS reviewed and all are negative for acute change except as noted in the HPI.  Physical Exam Updated Vital Signs BP (!) 97/50 (BP Location: Left Arm)   Pulse 108   Temp 100.4 F (38 C) (Oral)   Resp 20   SpO2 98%   Physical Exam  Constitutional: She is oriented to person, place, and time. She appears well-developed and well-nourished. No distress.  HENT:  Head: Normocephalic and atraumatic.  Right Ear: Tympanic membrane, external ear and ear  canal normal.  Left Ear: Tympanic membrane, external ear and ear canal normal.  Nose: Nose normal.  Mouth/Throat: Uvula is midline and mucous membranes are normal. No trismus in the jaw. Oropharyngeal exudate present. No posterior oropharyngeal erythema or tonsillar abscesses.  Eyes: Conjunctivae and EOM are normal. Pupils are equal, round, and reactive to light.  Neck: Normal range of motion. Neck supple. No tracheal deviation present.  Cardiovascular: Regular rhythm, S1 normal, S2 normal, normal heart sounds, intact distal pulses and normal pulses.  Tachycardia present.   Pulmonary/Chest: Effort normal. No respiratory distress. She has no decreased breath sounds. She has no wheezes. She has no rhonchi. She has rales in the right lower field.  Abdominal: Normal appearance and bowel sounds are normal. She exhibits no distension. There is no tenderness. There is no rigidity, no rebound, no guarding, no CVA tenderness, no tenderness at McBurney's point and negative Murphy's sign.  Musculoskeletal: Normal range of motion.  Neurological: She is alert and oriented to person, place, and time.  Skin: Skin is warm and dry.  Psychiatric: She has a normal mood and affect. Her speech is normal and behavior is normal. Thought content normal.  Nursing note and vitals reviewed.  ED Treatments / Results  DIAGNOSTIC STUDIES:  Oxygen Saturation is 98% on RA, normal by my interpretation.    COORDINATION OF CARE:  8:12 PM Discussed treatment plan with pt at bedside and pt agreed to plan.   Labs (all labs ordered are listed, but only abnormal results are displayed) Labs Reviewed  COMPREHENSIVE METABOLIC PANEL - Abnormal; Notable for the following:       Result Value   Glucose, Bld 119 (*)    All other components within normal limits  CBC WITH DIFFERENTIAL/PLATELET - Abnormal; Notable for the following:    WBC 22.7 (*)    RBC 3.83 (*)    Hemoglobin 11.6 (*)    HCT 34.9 (*)    Neutro Abs 19.5 (*)     All other components within normal limits  URINALYSIS, ROUTINE W REFLEX MICROSCOPIC - Abnormal; Notable for the following:    APPearance HAZY (*)    Hgb urine dipstick MODERATE (*)    Nitrite POSITIVE (*)    Bacteria, UA MANY (*)    Squamous Epithelial / LPF 0-5 (*)    All other components within normal limits  CULTURE, BLOOD (ROUTINE X 2)  CULTURE, BLOOD (ROUTINE X 2)  URINE CULTURE  PROTIME-INR  I-STAT CG4 LACTIC ACID, ED    EKG  EKG Interpretation None       Radiology Dg Chest 2 View  Result Date: 05/03/2016 CLINICAL DATA:  Coughing congestion.  Generalized weakness. EXAM: CHEST  2 VIEW COMPARISON:  04/22/2016 FINDINGS: Two-view exam shows persistent but improved right lower lobe airspace disease. Left lung clear. The cardiopericardial silhouette is within normal limits for size. The visualized bony structures of the thorax are intact. IMPRESSION: Persistent but improved right lower lobe airspace disease, possibly with superimposed atelectasis at  this time. Electronically Signed   By: Misty Stanley M.D.   On: 05/03/2016 19:54    Procedures Procedures (including critical care time)  Medications Ordered in ED Medications  levofloxacin (LEVAQUIN) IVPB 750 mg (750 mg Intravenous New Bag/Given 05/03/16 2051)  sodium chloride 0.9 % bolus 1,000 mL (1,000 mLs Intravenous New Bag/Given 05/03/16 2009)  ketorolac (TORADOL) 30 MG/ML injection 30 mg (30 mg Intravenous Given 05/03/16 2052)     Initial Impression / Assessment and Plan / ED Course  I have reviewed the triage vital signs and the nursing notes.  Pertinent labs & imaging results that were available during my care of the patient were reviewed by me and considered in my medical decision making (see chart for details).  Clinical Course    Final Clinical Impressions(s) / ED Diagnoses  {I have reviewed and evaluated the relevant laboratory values. {I have reviewed and evaluated the relevant imaging studies.  {I have reviewed  the relevant previous healthcare records.  {I obtained HPI from historian. {Patient discussed with supervising physician.  ED Course:  Assessment: Pt is a 55yF who presents with URI symptoms x 1.5 weeks. Seen on 12-31 and Dx CAP. Unable to afford ABX and didn't take. Returns for same. On exam, pt in NAD. Nontoxic/nonseptic appearing. VS with tachycardia. Low grade temp. Lungs with RLL rales. Abdomen nontender soft. CBC with WBC 22.7. Lactic Acid 0.86. CXR shows improved RL airspace disease. UA also shows positive UTI. Given Levaquin in ED to cover possible CAP and UTI. NS bolus given. Plan is to Admit to medicine due to significant leukocytopsis and hypotension on arrival. Failed outpatient therapy.   Disposition/Plan:  Admit Pt acknowledges and agrees with plan  Supervising Physician Leo Grosser, MD  Final diagnoses:  Community acquired pneumonia of right lower lobe of lung ([place])  Urinary tract infection without hematuria, site unspecified    New Prescriptions New Prescriptions   No medications on file   I personally performed the services described in this documentation, which was scribed in my presence. The recorded information has been reviewed and is accurate.    Shary Decamp, PA-C 05/03/16 2124    Leo Grosser, MD 05/04/16 [phone]

## 2016-05-04 ENCOUNTER — Encounter (HOSPITAL_COMMUNITY): Payer: Self-pay

## 2016-05-04 DIAGNOSIS — D649 Anemia, unspecified: Secondary | ICD-10-CM

## 2016-05-04 DIAGNOSIS — A419 Sepsis, unspecified organism: Principal | ICD-10-CM

## 2016-05-04 DIAGNOSIS — J181 Lobar pneumonia, unspecified organism: Secondary | ICD-10-CM

## 2016-05-04 DIAGNOSIS — N390 Urinary tract infection, site not specified: Secondary | ICD-10-CM

## 2016-05-04 DIAGNOSIS — F17200 Nicotine dependence, unspecified, uncomplicated: Secondary | ICD-10-CM

## 2016-05-04 LAB — IRON AND TIBC
IRON: 14 ug/dL — AB (ref 28–170)
SATURATION RATIOS: 5 % — AB (ref 10.4–31.8)
TIBC: 281 ug/dL (ref 250–450)
UIBC: 267 ug/dL

## 2016-05-04 LAB — RESPIRATORY PANEL BY PCR
Adenovirus: NOT DETECTED
BORDETELLA PERTUSSIS-RVPCR: NOT DETECTED
CHLAMYDOPHILA PNEUMONIAE-RVPPCR: NOT DETECTED
CORONAVIRUS HKU1-RVPPCR: NOT DETECTED
Coronavirus 229E: NOT DETECTED
Coronavirus NL63: NOT DETECTED
Coronavirus OC43: NOT DETECTED
INFLUENZA A-RVPPCR: NOT DETECTED
INFLUENZA B-RVPPCR: NOT DETECTED
METAPNEUMOVIRUS-RVPPCR: NOT DETECTED
Mycoplasma pneumoniae: NOT DETECTED
PARAINFLUENZA VIRUS 3-RVPPCR: NOT DETECTED
Parainfluenza Virus 1: NOT DETECTED
Parainfluenza Virus 2: NOT DETECTED
Parainfluenza Virus 4: NOT DETECTED
RESPIRATORY SYNCYTIAL VIRUS-RVPPCR: NOT DETECTED
RHINOVIRUS / ENTEROVIRUS - RVPPCR: NOT DETECTED

## 2016-05-04 LAB — STREP PNEUMONIAE URINARY ANTIGEN: Strep Pneumo Urinary Antigen: NEGATIVE

## 2016-05-04 LAB — FOLATE: FOLATE: 11.4 ng/mL (ref >5.9)

## 2016-05-04 LAB — FERRITIN: Ferritin: 170 ng/mL (ref 11–307)

## 2016-05-04 LAB — CBC WITH DIFFERENTIAL/PLATELET
Basophils Absolute: 0 10*3/uL (ref 0.0–0.1)
Basophils Relative: 0 %
EOS PCT: 1 %
Eosinophils Absolute: 0.1 10*3/uL (ref 0.0–0.7)
HCT: 33.5 % — ABNORMAL LOW (ref 36.0–46.0)
Hemoglobin: 11 g/dL — ABNORMAL LOW (ref 12.0–15.0)
LYMPHS ABS: 2.7 10*3/uL (ref 0.7–4.0)
Lymphocytes Relative: 16 %
MCH: 30.5 pg (ref 26.0–34.0)
MCHC: 32.8 g/dL (ref 30.0–36.0)
MCV: 92.8 fL (ref 78.0–100.0)
MONO ABS: 1.3 10*3/uL — AB (ref 0.1–1.0)
Monocytes Relative: 7 %
NEUTROS ABS: 12.7 10*3/uL — AB (ref 1.7–7.7)
Neutrophils Relative %: 76 %
PLATELETS: 285 10*3/uL (ref 150–400)
RBC: 3.61 MIL/uL — AB (ref 3.87–5.11)
RDW: 14.6 % (ref 11.5–15.5)
WBC: 16.8 10*3/uL — AB (ref 4.0–10.5)

## 2016-05-04 LAB — EXPECTORATED SPUTUM ASSESSMENT W REFEX TO RESP CULTURE

## 2016-05-04 LAB — VITAMIN B12: VITAMIN B 12: 515 pg/mL (ref 180–914)

## 2016-05-04 LAB — EXPECTORATED SPUTUM ASSESSMENT W GRAM STAIN, RFLX TO RESP C

## 2016-05-04 MED ORDER — OSELTAMIVIR PHOSPHATE 75 MG PO CAPS
75.0000 mg | ORAL_CAPSULE | Freq: Two times a day (BID) | ORAL | Status: DC
  Administered 2016-05-04: 75 mg via ORAL
  Filled 2016-05-04: qty 1

## 2016-05-04 MED ORDER — GUAIFENESIN ER 600 MG PO TB12
1200.0000 mg | ORAL_TABLET | Freq: Two times a day (BID) | ORAL | Status: DC
  Administered 2016-05-04 – 2016-05-06 (×5): 1200 mg via ORAL
  Filled 2016-05-04 (×5): qty 2

## 2016-05-04 MED ORDER — KETOROLAC TROMETHAMINE 15 MG/ML IJ SOLN
15.0000 mg | Freq: Four times a day (QID) | INTRAMUSCULAR | Status: DC | PRN
  Administered 2016-05-04 – 2016-05-06 (×7): 15 mg via INTRAVENOUS
  Filled 2016-05-04 (×7): qty 1

## 2016-05-04 MED ORDER — PHENOL 1.4 % MT LIQD
1.0000 | OROMUCOSAL | Status: DC | PRN
  Administered 2016-05-04: 1 via OROMUCOSAL
  Filled 2016-05-04: qty 177

## 2016-05-04 MED ORDER — FERROUS SULFATE 325 (65 FE) MG PO TABS
325.0000 mg | ORAL_TABLET | Freq: Two times a day (BID) | ORAL | Status: DC
  Administered 2016-05-04 – 2016-05-06 (×4): 325 mg via ORAL
  Filled 2016-05-04 (×4): qty 1

## 2016-05-04 NOTE — Care Management Note (Signed)
Case Management Note  Patient Details  Name: SHRIKA TANNA MRN: [id number] Date of Birth: 1961/02/25  Subjective/Objective:55 y/o f admitted w/CAP. From home. Noted from prior admission-CM informed patient to contact [facility] for appt, has orange card,& provided [employer] discount coupon for $7-med. Patient has missed many appts set up w/[facility] in past.  I have set up an appt w/[facility]) 1/30 @9a -patient given infor for appt-reminded of importance of adhering to appt-patient voiced understanding. Also provided w[facility] med list-she says if on $4 [employer] list she can afford. No further CM needs.                    Action/Plan:d/c home.   Expected Discharge Date:   (unknown)               Expected Discharge Plan:  Home/Self Care  In-House Referral:     Discharge planning Services  CM Consult, [facility], Medication Assistance  Post Acute Care Choice:    Choice offered to:     DME Arranged:    DME Agency:     HH Arranged:    HH Agency:     Status of Service:  In process, will continue to follow  If discussed at Long Length of Stay Meetings, dates discussed:    Additional Comments:  Dessa Phi, RN 05/04/2016, 12:04 PM

## 2016-05-04 NOTE — Progress Notes (Signed)
PROGRESS NOTE    Kelli Jenkins  [id number] DOB: 12-02-1960 DOA: 05/03/2016 PCP: No PCP Per Patient   Chief Complaint  Patient presents with  . URI  . Weakness    Brief Narrative:  HPI on 05/03/2016 by Dr. Christia Reading Jenkins Kelli Jenkins is a 56 y.o. female with medical history significant for uterine cancer status post hysterectomy, cocaine abuse, and tobacco abuse who presents to the emergency department with productive cough, dyspnea, dysuria, and generalized weakness going back more than 2 weeks. Patient reports that she had been in her usual state until the insidious development of rhinorrhea and sinus congestion more than 2 weeks ago. She reports that this was accompanied by sore throat and she soon developed dyspnea and cough as well. Patient was seen in the emergency department for these complaints on 04/22/2016, diagnosed with a pneumonia, and discharged home with Levaquin and prednisone. Patient reports that she was unable to afford her medications and hoped that she would recover on her own. She reports some initial improvement without any specific treatment, but notes that over the past several days she has began to worsen again. She has also developed dysuria in the past several days and has not attempted any interventions prior to coming in. She reports fevers, aches, and generalized weakness, but denies any chest pain, palpitations, change in vision or hearing, or loss of coordination. Assessment & Plan   Sepsis secondary to Pneumonia/UTI -Percent with fever, tachycardia, leukocytosis -Chest x-ray showed persistent but improved right lower lobe airspace disease -She had been prescribed Levaquin on 04/22/16, but reports she could not afford it; she had already completed a Z-pack at that time -BP is soft on presentation with mild tachycardia; lactate is reassuring at 0.86 and pt responded well to IVF  -Blood and urine cultures pending -Received dose of levaquin in the  ED -Currently on rocephin and doxycycline -Sputum culture pending -Respiratory viral panel negative -Strep pneumonia urine antigen negative -Will discontinue tamiflu  UTI  -Pt presents with dysuria and UA is suggestive of infection -Urine culture pending  -prior urine cultures grew pan-sensitive E coli -Continue ceftriaxone  Normocytic anemia  -Hemoglobin stable, close to baseline.  Currently 11 -Anemia panel: Iron 14, Ferritin 170 -Will start on iron supplementation  Current smoker  -Discussed smoking cessation -Nicotine patch offered   Noncompliance/nonadherence -Last admission, patient was given appointment with the [facility] however failed to show up. Patient also failed to fill her medication siting it was too expensive. Patient was given a voucher this was confirmed with case management. Patient could have gone her Levaquin for $7 however again failed to get filled. -CM consulted to assist patient with PCP and medications  DVT Prophylaxis  Lovenox  Code Status: Full  Family Communication: none at bedside  Disposition Plan: Currently in observation. Pending improvement in breathing.   Consultants None  Procedures  None  Antibiotics   Anti-infectives    Start     Dose/Rate Route Frequency Ordered Stop   05/04/16 2200  cefTRIAXone (ROCEPHIN) 1 g in dextrose 5 % 50 mL IVPB     1 g 100 mL/hr over 30 Minutes Intravenous Every 24 hours 05/03/16 2139 05/11/16 2159   05/04/16 2200  doxycycline (VIBRA-TABS) tablet 100 mg     100 mg Oral Every 12 hours 05/03/16 2139 05/11/16 2159   05/04/16 1000  oseltamivir (TAMIFLU) capsule 75 mg     75 mg Oral 2 times daily 05/04/16 0921 05/09/16 0959   05/03/16 2015  levofloxacin (LEVAQUIN) IVPB 750 mg     750 mg 100 mL/hr over 90 Minutes Intravenous  Once 05/03/16 2014 05/03/16 2221      Subjective:   Kelli Jenkins seen and examined today.  Complains of sore throat, ear pain, congestion, cough, body aches.  Denies chest  pain. Feels very tired and weak.   Objective:   Vitals:   05/03/16 2200 05/03/16 2240 05/04/16 0622 05/04/16 1254  BP: (!) 131/48 105/70 (!) 131/99 135/69  Pulse: 86 88 87 81  Resp: 23 20 20 18   Temp:  98.9 F (37.2 C) 98.3 F (36.8 C) 98 F (36.7 C)  TempSrc:  Oral Oral Oral  SpO2: 92% 96% 98% 99%  Weight:  98.6 kg (217 lb 6 oz) 98.6 kg (217 lb 6 oz)   Height:  5\' 8"  (1.727 m)      Intake/Output Summary (Last 24 hours) at 05/04/16 1357 Last data filed at 05/04/16 1100  Gross per 24 hour  Intake              895 ml  Output              500 ml  Net              395 ml   Filed Weights   05/03/16 2240 05/04/16 0622  Weight: 98.6 kg (217 lb 6 oz) 98.6 kg (217 lb 6 oz)    Exam  General: Well developed, well nourished, NAD, appears stated age  HEENT: NCAT, mucous membranes moist.   Cardiovascular: S1 S2 auscultated, RRR, no murmurs  Respiratory: Diminished but clear  Abdomen: Soft, nontender, nondistended, + bowel sounds  Extremities: warm dry without cyanosis clubbing or edema  Neuro: AAOx3, nonfocal  Psych: Normal affect and demeanor  Data Reviewed: I have personally reviewed following labs and imaging studies  CBC:  Recent Labs Lab 05/03/16 1856 05/04/16 0526  WBC 22.7* 16.8*  NEUTROABS 19.5* 12.7*  HGB 11.6* 11.0*  HCT 34.9* 33.5*  MCV 91.1 92.8  PLT 347 [id number]   Basic Metabolic Panel:  Recent Labs Lab 05/03/16 1856  NA 137  K 3.9  CL 106  CO2 23  GLUCOSE 119*  BUN 8  CREATININE 0.86  CALCIUM 8.9   GFR: Estimated Creatinine Clearance: 89.7 mL/min (by C-G formula based on SCr of 0.86 mg/dL). Liver Function Tests:  Recent Labs Lab 05/03/16 1856  AST 15  ALT 15  ALKPHOS 76  BILITOT 0.6  PROT 7.0  ALBUMIN 3.6   No results for input(s): LIPASE, AMYLASE in the last 168 hours. No results for input(s): AMMONIA in the last 168 hours. Coagulation Profile:  Recent Labs Lab 05/03/16 1856  INR 1.03   Cardiac Enzymes: No results for  input(s): CKTOTAL, CKMB, CKMBINDEX, TROPONINI in the last 168 hours. BNP (last 3 results) No results for input(s): PROBNP in the last 8760 hours. HbA1C: No results for input(s): HGBA1C in the last 72 hours. CBG: No results for input(s): GLUCAP in the last 168 hours. Lipid Profile: No results for input(s): CHOL, HDL, LDLCALC, TRIG, CHOLHDL, LDLDIRECT in the last 72 hours. Thyroid Function Tests: No results for input(s): TSH, T4TOTAL, FREET4, T3FREE, THYROIDAB in the last 72 hours. Anemia Panel:  Recent Labs  05/03/16 1900  VITAMINB12 515  FOLATE 11.4  FERRITIN 170  TIBC 281  IRON 14*  RETICCTPCT 1.7   Urine analysis:    Component Value Date/Time   COLORURINE YELLOW 05/03/2016 1800   APPEARANCEUR HAZY (A) 05/03/2016 1800  LABSPEC 1.015 05/03/2016 1800   PHURINE 7.0 05/03/2016 1800   GLUCOSEU NEGATIVE 05/03/2016 1800   HGBUR MODERATE (A) 05/03/2016 1800   BILIRUBINUR NEGATIVE 05/03/2016 1800   KETONESUR NEGATIVE 05/03/2016 1800   PROTEINUR NEGATIVE 05/03/2016 1800   UROBILINOGEN 1.0 03/04/2015 1904   NITRITE POSITIVE (A) 05/03/2016 1800   LEUKOCYTESUR NEGATIVE 05/03/2016 1800   Sepsis Labs: @LABRCNTIP (procalcitonin:4,lacticidven:4)  ) Recent Results (from the past 240 hour(s))  Respiratory Panel by PCR     Status: None   Collection Time: 05/03/16 11:05 PM  Result Value Ref Range Status   Adenovirus NOT DETECTED NOT DETECTED Final   Coronavirus 229E NOT DETECTED NOT DETECTED Final   Coronavirus HKU1 NOT DETECTED NOT DETECTED Final   Coronavirus NL63 NOT DETECTED NOT DETECTED Final   Coronavirus OC43 NOT DETECTED NOT DETECTED Final   Metapneumovirus NOT DETECTED NOT DETECTED Final   Rhinovirus / Enterovirus NOT DETECTED NOT DETECTED Final   Influenza A NOT DETECTED NOT DETECTED Final   Influenza B NOT DETECTED NOT DETECTED Final   Parainfluenza Virus 1 NOT DETECTED NOT DETECTED Final   Parainfluenza Virus 2 NOT DETECTED NOT DETECTED Final   Parainfluenza Virus 3  NOT DETECTED NOT DETECTED Final   Parainfluenza Virus 4 NOT DETECTED NOT DETECTED Final   Respiratory Syncytial Virus NOT DETECTED NOT DETECTED Final   Bordetella pertussis NOT DETECTED NOT DETECTED Final   Chlamydophila pneumoniae NOT DETECTED NOT DETECTED Final   Mycoplasma pneumoniae NOT DETECTED NOT DETECTED Final    Comment: Performed at [facility]  Culture, sputum-assessment     Status: None   Collection Time: 05/04/16 10:38 AM  Result Value Ref Range Status   Specimen Description SPUTUM  Final   Special Requests NONE  Final   Sputum evaluation THIS SPECIMEN IS ACCEPTABLE FOR SPUTUM CULTURE  Final   Report Status 05/04/2016 FINAL  Final      Radiology Studies: Dg Chest 2 View  Result Date: 05/03/2016 CLINICAL DATA:  Coughing congestion.  Generalized weakness. EXAM: CHEST  2 VIEW COMPARISON:  04/22/2016 FINDINGS: Two-view exam shows persistent but improved right lower lobe airspace disease. Left lung clear. The cardiopericardial silhouette is within normal limits for size. The visualized bony structures of the thorax are intact. IMPRESSION: Persistent but improved right lower lobe airspace disease, possibly with superimposed atelectasis at this time. Electronically Signed   By: Misty Stanley M.D.   On: 05/03/2016 19:54     Scheduled Meds: . cefTRIAXone (ROCEPHIN)  IV  1 g Intravenous Q24H  . doxycycline  100 mg Oral Q12H  . enoxaparin (LOVENOX) injection  40 mg Subcutaneous Q24H  . guaiFENesin  1,200 mg Oral BID  . nicotine  21 mg Transdermal Daily  . oseltamivir  75 mg Oral BID   Continuous Infusions:   LOS: 0 days   Time Spent in minutes   30 minutes  Ocean Schildt D.O. on 05/04/2016 at 1:57 PM  Between 7am to 7pm - Pager - [phone]  After 7pm go to [email or web address] - password [email or web address]  And look for the night coverage person covering for me after hours  [facility] Office  [phone]

## 2016-05-05 ENCOUNTER — Encounter (HOSPITAL_COMMUNITY): Payer: Self-pay

## 2016-05-05 LAB — CULTURE, RESPIRATORY

## 2016-05-05 LAB — BASIC METABOLIC PANEL
ANION GAP: 6 (ref 5–15)
BUN: 17 mg/dL (ref 6–20)
CO2: 27 mmol/L (ref 22–32)
Calcium: 8.8 mg/dL — ABNORMAL LOW (ref 8.9–10.3)
Chloride: 105 mmol/L (ref 101–111)
Creatinine, Ser: 0.77 mg/dL (ref 0.44–1.00)
GFR calc Af Amer: >60 mL/min (ref >60)
Glucose, Bld: 105 mg/dL — ABNORMAL HIGH (ref 65–99)
POTASSIUM: 3.2 mmol/L — AB (ref 3.5–5.1)
Sodium: 138 mmol/L (ref 135–145)

## 2016-05-05 LAB — CBC
HEMATOCRIT: 33.3 % — AB (ref 36.0–46.0)
Hemoglobin: 11 g/dL — ABNORMAL LOW (ref 12.0–15.0)
MCH: 29.9 pg (ref 26.0–34.0)
MCHC: 33 g/dL (ref 30.0–36.0)
MCV: 90.5 fL (ref 78.0–100.0)
Platelets: 325 10*3/uL (ref 150–400)
RBC: 3.68 MIL/uL — AB (ref 3.87–5.11)
RDW: 14.2 % (ref 11.5–15.5)
WBC: 11.7 10*3/uL — AB (ref 4.0–10.5)

## 2016-05-05 LAB — CULTURE, RESPIRATORY W GRAM STAIN

## 2016-05-05 LAB — HIV ANTIBODY (ROUTINE TESTING W REFLEX): HIV Screen 4th Generation wRfx: NONREACTIVE

## 2016-05-05 MED ORDER — POTASSIUM CHLORIDE CRYS ER 20 MEQ PO TBCR
40.0000 meq | EXTENDED_RELEASE_TABLET | Freq: Once | ORAL | Status: AC
  Administered 2016-05-05: 40 meq via ORAL
  Filled 2016-05-05: qty 2

## 2016-05-05 NOTE — Progress Notes (Addendum)
PROGRESS NOTE    Kelli Jenkins  [id number] DOB: Apr 26, 1960 DOA: 05/03/2016 PCP: No PCP Per Patient   Chief Complaint  Patient presents with  . URI  . Weakness    Brief Narrative:  HPI on 05/03/2016 by Dr. Christia [name] Kelli Jenkins is a 56 y.o. female with medical history significant for uterine cancer status post hysterectomy, cocaine abuse, and tobacco abuse who presents to the emergency department with productive cough, dyspnea, dysuria, and generalized weakness going back more than 2 weeks. Patient reports that she had been in her usual state until the insidious development of rhinorrhea and sinus congestion more than 2 weeks ago. She reports that this was accompanied by sore throat and she soon developed dyspnea and cough as well. Patient was seen in the emergency department for these complaints on 04/22/2016, diagnosed with a pneumonia, and discharged home with Levaquin and prednisone. Patient reports that she was unable to afford her medications and hoped that she would recover on her own. She reports some initial improvement without any specific treatment, but notes that over the past several days she has began to worsen again. She has also developed dysuria in the past several days and has not attempted any interventions prior to coming in. She reports fevers, aches, and generalized weakness, but denies any chest pain, palpitations, change in vision or hearing, or loss of coordination. Assessment & Plan   Sepsis secondary to Pneumonia/UTI -Percent with fever, tachycardia, leukocytosis -Chest x-ray showed persistent but improved right lower lobe airspace disease -She had been prescribed Levaquin on 04/22/16, but reports she could not afford it; she had already completed a Z-pack at that time -BP is soft on presentation with mild tachycardia; lactate is reassuring at 0.86 and pt responded well to IVF  -Blood cultures show no growth to date -Urine culture >100K GNR -Received dose  of levaquin in the ED -Currently on rocephin and doxycycline -Sputum culture pending.  -Respiratory viral panel negative -Strep pneumonia urine antigen negative -Discontinued tamiflu  UTI  -Pt presents with dysuria and UA is suggestive of infection -Urine culture >100K GNR -prior urine cultures grew pan-sensitive E coli -Continue ceftriaxone  Hypokalemia -Potassium 3.2 today -Will replace and continue to monitor BMP  Normocytic anemia  -Hemoglobin stable, close to baseline.  Currently 11 -Anemia panel: Iron 14, Ferritin 170 -Continue iron supplementation  Current smoker  -Discussed smoking cessation -Nicotine patch offered   Noncompliance/nonadherence -Last admission, patient was given appointment with the [facility] however failed to show up. Patient also failed to fill her medication siting it was too expensive. Patient was given a voucher this was confirmed with case management. Patient could have gone her Levaquin for $7 however again failed to get filled. -CM consulted to assist patient with PCP and medications  DVT Prophylaxis  Lovenox  Code Status: Full  Family Communication: none at bedside  Disposition Plan: Admitted. Home when stable  Consultants None  Procedures  None  Antibiotics   Anti-infectives    Start     Dose/Rate Route Frequency Ordered Stop   05/04/16 2200  cefTRIAXone (ROCEPHIN) 1 g in dextrose 5 % 50 mL IVPB     1 g 100 mL/hr over 30 Minutes Intravenous Every 24 hours 05/03/16 2139 05/11/16 2159   05/04/16 2200  doxycycline (VIBRA-TABS) tablet 100 mg     100 mg Oral Every 12 hours 05/03/16 2139 05/11/16 2159   05/04/16 1000  oseltamivir (TAMIFLU) capsule 75 mg  Status:  Discontinued  75 mg Oral 2 times daily 05/04/16 0921 05/04/16 1414   05/03/16 2015  levofloxacin (LEVAQUIN) IVPB 750 mg     750 mg 100 mL/hr over 90 Minutes Intravenous  Once 05/03/16 2014 05/03/16 2221      Subjective:   Kelli Jenkins seen and examined today.   Continues to complain of sore throat, ear pain, congestion, cough, body aches.  Does not feel any better.  Denies chest pain, abdominal pain.  Feels cold.   Objective:   Vitals:   05/04/16 0622 05/04/16 1254 05/04/16 2058 05/05/16 0445  BP: (!) 131/99 135/69 121/72 108/73  Pulse: 87 81 90 100  Resp: 20 18 18 18   Temp: 98.3 F (36.8 C) 98 F (36.7 C) 98.5 F (36.9 C) 98.3 F (36.8 C)  TempSrc: Oral Oral Oral Oral  SpO2: 98% 99% 96% 96%  Weight: 98.6 kg (217 lb 6 oz)   98.1 kg (216 lb 4.3 oz)  Height:        Intake/Output Summary (Last 24 hours) at 05/05/16 1048 Last data filed at 05/05/16 0200  Gross per 24 hour  Intake              110 ml  Output                0 ml  Net              110 ml   Filed Weights   05/03/16 2240 05/04/16 0622 05/05/16 0445  Weight: 98.6 kg (217 lb 6 oz) 98.6 kg (217 lb 6 oz) 98.1 kg (216 lb 4.3 oz)    Exam  General: Well developed, well nourished, NAD, appears stated age  HEENT: NCAT, mucous membranes moist.   Cardiovascular: S1 S2 auscultated, RRR, no murmurs  Respiratory: Diminished but clear  Abdomen: Soft, nontender, nondistended, + bowel sounds  Extremities: warm dry without cyanosis clubbing or edema  Neuro: AAOx3, nonfocal  Psych: Normal affect and demeanor  Data Reviewed: I have personally reviewed following labs and imaging studies  CBC:  Recent Labs Lab 05/03/16 1856 05/04/16 0526 05/05/16 0553  WBC 22.7* 16.8* 11.7*  NEUTROABS 19.5* 12.7*  --   HGB 11.6* 11.0* 11.0*  HCT 34.9* 33.5* 33.3*  MCV 91.1 92.8 90.5  PLT 347 285 [id number]   Basic Metabolic Panel:  Recent Labs Lab 05/03/16 1856 05/05/16 0553  NA 137 138  K 3.9 3.2*  CL 106 105  CO2 23 27  GLUCOSE 119* 105*  BUN 8 17  CREATININE 0.86 0.77  CALCIUM 8.9 8.8*   GFR: Estimated Creatinine Clearance: 96.2 mL/min (by C-G formula based on SCr of 0.77 mg/dL). Liver Function Tests:  Recent Labs Lab 05/03/16 1856  AST 15  ALT 15  ALKPHOS 76  BILITOT  0.6  PROT 7.0  ALBUMIN 3.6   No results for input(s): LIPASE, AMYLASE in the last 168 hours. No results for input(s): AMMONIA in the last 168 hours. Coagulation Profile:  Recent Labs Lab 05/03/16 1856  INR 1.03   Cardiac Enzymes: No results for input(s): CKTOTAL, CKMB, CKMBINDEX, TROPONINI in the last 168 hours. BNP (last 3 results) No results for input(s): PROBNP in the last 8760 hours. HbA1C: No results for input(s): HGBA1C in the last 72 hours. CBG: No results for input(s): GLUCAP in the last 168 hours. Lipid Profile: No results for input(s): CHOL, HDL, LDLCALC, TRIG, CHOLHDL, LDLDIRECT in the last 72 hours. Thyroid Function Tests: No results for input(s): TSH, T4TOTAL, FREET4, T3FREE, THYROIDAB in the  last 72 hours. Anemia Panel:  Recent Labs  05/03/16 1900  VITAMINB12 515  FOLATE 11.4  FERRITIN 170  TIBC 281  IRON 14*  RETICCTPCT 1.7   Urine analysis:    Component Value Date/Time   COLORURINE YELLOW 05/03/2016 1800   APPEARANCEUR HAZY (A) 05/03/2016 1800   LABSPEC 1.015 05/03/2016 1800   PHURINE 7.0 05/03/2016 1800   GLUCOSEU NEGATIVE 05/03/2016 1800   HGBUR MODERATE (A) 05/03/2016 1800   BILIRUBINUR NEGATIVE 05/03/2016 1800   KETONESUR NEGATIVE 05/03/2016 1800   PROTEINUR NEGATIVE 05/03/2016 1800   UROBILINOGEN 1.0 03/04/2015 1904   NITRITE POSITIVE (A) 05/03/2016 1800   LEUKOCYTESUR NEGATIVE 05/03/2016 1800   Sepsis Labs: @LABRCNTIP (procalcitonin:4,lacticidven:4)  ) Recent Results (from the past 240 hour(s))  Urine culture     Status: Abnormal (Preliminary result)   Collection Time: 05/03/16  6:00 PM  Result Value Ref Range Status   Specimen Description URINE, CLEAN CATCH  Final   Special Requests NONE  Final   Culture (A)  Final    >=100,000 COLONIES/mL ESCHERICHIA COLI SUSCEPTIBILITIES TO FOLLOW Performed at [facility]    Report Status PENDING  Incomplete  Culture, blood (Routine x 2)     Status: None (Preliminary result)    Collection Time: 05/03/16  6:56 PM  Result Value Ref Range Status   Specimen Description BLOOD RIGHT ANTECUBITAL  Final   Special Requests BOTTLES DRAWN AEROBIC AND ANAEROBIC 5CC  Final   Culture   Final    NO GROWTH 2 DAYS Performed at [facility]    Report Status PENDING  Incomplete  Culture, blood (Routine x 2)     Status: None (Preliminary result)   Collection Time: 05/03/16  7:08 PM  Result Value Ref Range Status   Specimen Description BLOOD RIGHT HAND  Final   Special Requests BOTTLES DRAWN AEROBIC AND ANAEROBIC 5CC  Final   Culture   Final    NO GROWTH 2 DAYS Performed at [facility]    Report Status PENDING  Incomplete  Respiratory Panel by PCR     Status: None   Collection Time: 05/03/16 11:05 PM  Result Value Ref Range Status   Adenovirus NOT DETECTED NOT DETECTED Final   Coronavirus 229E NOT DETECTED NOT DETECTED Final   Coronavirus HKU1 NOT DETECTED NOT DETECTED Final   Coronavirus NL63 NOT DETECTED NOT DETECTED Final   Coronavirus OC43 NOT DETECTED NOT DETECTED Final   Metapneumovirus NOT DETECTED NOT DETECTED Final   Rhinovirus / Enterovirus NOT DETECTED NOT DETECTED Final   Influenza A NOT DETECTED NOT DETECTED Final   Influenza B NOT DETECTED NOT DETECTED Final   Parainfluenza Virus 1 NOT DETECTED NOT DETECTED Final   Parainfluenza Virus 2 NOT DETECTED NOT DETECTED Final   Parainfluenza Virus 3 NOT DETECTED NOT DETECTED Final   Parainfluenza Virus 4 NOT DETECTED NOT DETECTED Final   Respiratory Syncytial Virus NOT DETECTED NOT DETECTED Final   Bordetella pertussis NOT DETECTED NOT DETECTED Final   Chlamydophila pneumoniae NOT DETECTED NOT DETECTED Final   Mycoplasma pneumoniae NOT DETECTED NOT DETECTED Final    Comment: Performed at [facility]  Culture, sputum-assessment     Status: None   Collection Time: 05/04/16 10:38 AM  Result Value Ref Range Status   Specimen Description SPUTUM  Final   Special Requests NONE  Final   Sputum  evaluation THIS SPECIMEN IS ACCEPTABLE FOR SPUTUM CULTURE  Final   Report Status 05/04/2016 FINAL  Final  Culture, respiratory (  NON-Expectorated)     Status: None (Preliminary result)   Collection Time: 05/04/16 10:38 AM  Result Value Ref Range Status   Specimen Description SPUTUM  Final   Special Requests NONE Reflexed from [id number]  Final   Gram Stain   Final    ABUNDANT WBC PRESENT, PREDOMINANTLY PMN RARE SQUAMOUS EPITHELIAL CELLS PRESENT MODERATE GRAM POSITIVE COCCI IN CHAINS FEW GRAM POSITIVE COCCOBACILLUS    Culture   Final    CULTURE REINCUBATED FOR BETTER GROWTH Performed at [facility]    Report Status PENDING  Incomplete      Radiology Studies: Dg Chest 2 View  Result Date: 05/03/2016 CLINICAL DATA:  Coughing congestion.  Generalized weakness. EXAM: CHEST  2 VIEW COMPARISON:  04/22/2016 FINDINGS: Two-view exam shows persistent but improved right lower lobe airspace disease. Left lung clear. The cardiopericardial silhouette is within normal limits for size. The visualized bony structures of the thorax are intact. IMPRESSION: Persistent but improved right lower lobe airspace disease, possibly with superimposed atelectasis at this time. Electronically Signed   By: Misty Stanley M.D.   On: 05/03/2016 19:54     Scheduled Meds: . cefTRIAXone (ROCEPHIN)  IV  1 g Intravenous Q24H  . doxycycline  100 mg Oral Q12H  . enoxaparin (LOVENOX) injection  40 mg Subcutaneous Q24H  . ferrous sulfate  325 mg Oral BID WC  . guaiFENesin  1,200 mg Oral BID  . nicotine  21 mg Transdermal Daily   Continuous Infusions:   LOS: 1 day   Time Spent in minutes   30 minutes  Camdynn Maranto D.O. on 05/05/2016 at 10:48 AM  Between 7am to 7pm - Pager - [phone]  After 7pm go to [email or web address] - password [email or web address]  And look for the night coverage person covering for me after hours  [facility] Office  [phone]

## 2016-05-05 NOTE — Progress Notes (Signed)
Patient coughed up what appears to be some sort of flesh looking specimen about the size of a quarter. It was pink and yellow in appearance. The patient then stated that "Someone told her if she coughed this up it needed to be sent for testing". There is no record of this so I notified MD on call and it was sent to pathology for testing.  [place]

## 2016-05-06 LAB — BASIC METABOLIC PANEL
Anion gap: 6 (ref 5–15)
BUN: 21 mg/dL — AB (ref 6–20)
CALCIUM: 8.7 mg/dL — AB (ref 8.9–10.3)
CO2: 27 mmol/L (ref 22–32)
CREATININE: 0.86 mg/dL (ref 0.44–1.00)
Chloride: 106 mmol/L (ref 101–111)
GFR calc Af Amer: >60 mL/min (ref >60)
GLUCOSE: 116 mg/dL — AB (ref 65–99)
Potassium: 4.5 mmol/L (ref 3.5–5.1)
Sodium: 139 mmol/L (ref 135–145)

## 2016-05-06 LAB — URINE CULTURE: Culture: >=100000 — AB

## 2016-05-06 LAB — CBC
HCT: 35.6 % — ABNORMAL LOW (ref 36.0–46.0)
Hemoglobin: 11.8 g/dL — ABNORMAL LOW (ref 12.0–15.0)
MCH: 31.1 pg (ref 26.0–34.0)
MCHC: 33.1 g/dL (ref 30.0–36.0)
MCV: 93.7 fL (ref 78.0–100.0)
PLATELETS: 346 10*3/uL (ref 150–400)
RBC: 3.8 MIL/uL — ABNORMAL LOW (ref 3.87–5.11)
RDW: 14.5 % (ref 11.5–15.5)
WBC: 9.7 10*3/uL (ref 4.0–10.5)

## 2016-05-06 MED ORDER — LEVOFLOXACIN 750 MG PO TABS: 750.0000 mg | ORAL_TABLET | Freq: Every day | ORAL | 0 refills | Status: DC

## 2016-05-06 MED ORDER — NICOTINE 21 MG/24HR TD PT24: 21.0000 mg | MEDICATED_PATCH | Freq: Every day | TRANSDERMAL | 0 refills | Status: DC

## 2016-05-06 MED ORDER — FERROUS SULFATE 325 (65 FE) MG PO TABS: 325.0000 mg | ORAL_TABLET | Freq: Two times a day (BID) | ORAL | 0 refills | Status: DC

## 2016-05-06 MED ORDER — GUAIFENESIN ER 600 MG PO TB12: 1200.0000 mg | ORAL_TABLET | Freq: Two times a day (BID) | ORAL | 0 refills | Status: DC

## 2016-05-06 NOTE — Care Management Note (Signed)
Case Management Note  Patient Details  Name: JONESHIA MARCOS MRN: [id number] Date of Birth: 11-19-1960  Subjective/Objective:                  UTI  Action/Plan: CM spoke with patient at the bedside. Patient states she needs a coupon for Levaquin to purchase it at [facility]. She states she did not purchase the medication the last time she was here but she will this time. States she did not realize how important it was to take the antibiotic. CM provided her with a coupon for Levaquin 750mg  for $10.25 at [facility] and a coupon for Levaquin 500mg  for $7 both for 10 day supplies. Patient verbalizes understanding to use whichever coupon matches her prescription at [facility]. She also wanted to verify the date of her PCP appointment. She states her appointment is at the [facility] on 05/22/16 at 9 am. She states she will not miss the appointment. States she has the papers she was given on Friday. CM confirmed the appointment date which was provided to the patient by [facility] CM on 05/04/16. She states she understands how important it is for her to go to the appointment.   Expected Discharge Date:  05/06/2016           Expected Discharge Plan:  Home/Self Care  In-House Referral:     Discharge planning Services  CM Consult, [facility], Medication Assistance  Post Acute Care Choice:  NA Choice offered to:     DME Arranged:  N/A DME Agency:  NA  HH Arranged:  NA HH Agency:     Status of Service:  Completed, signed off  If discussed at [place] of Stay Meetings, dates discussed:    Additional Comments:  Apolonio Schneiders, RN 05/06/2016, 10:40 AM

## 2016-05-06 NOTE — Discharge Instructions (Signed)
Community-Acquired Pneumonia, Adult Introduction Pneumonia is an infection of the lungs. One type of pneumonia can happen while a person is in a hospital. A different type can happen when a person is not in a hospital (community-acquired pneumonia). It is easy for this kind to spread from person to person. It can spread to you if you breathe near an infected person who coughs or sneezes. Some symptoms include:  A dry cough.  A wet (productive) cough.  Fever.  Sweating.  Chest pain. Follow these instructions at home:  Take over-the-counter and prescription medicines only as told by your doctor.  Only take cough medicine if you are losing sleep.  If you were prescribed an antibiotic medicine, take it as told by your doctor. Do not stop taking the antibiotic even if you start to feel better.  Sleep with your head and neck raised (elevated). You can do this by putting a few pillows under your head, or you can sleep in a recliner.  Do not use tobacco products. These include cigarettes, chewing tobacco, and e-cigarettes. If you need help quitting, ask your doctor.  Drink enough water to keep your pee (urine) clear or pale yellow. A shot (vaccine) can help prevent pneumonia. Shots are often suggested for:  People older than 57 years of age.  People older than 57 years of age:  Who are having cancer treatment.  Who have long-term (chronic) lung disease.  Who have problems with their body's defense system (immune system). You may also prevent pneumonia if you take these actions:  Get the flu (influenza) shot every year.  Go to the dentist as often as told.  Wash your hands often. If soap and water are not available, use hand sanitizer. Contact a doctor if:  You have a fever.  You lose sleep because your cough medicine does not help. Get help right away if:  You are short of breath and it gets worse.  You have more chest pain.  Your sickness gets worse. This is very  serious if:  You are an older adult.  Your body's defense system is weak.  You cough up blood. This information is not intended to replace advice given to you by your health care provider. Make sure you discuss any questions you have with your health care provider. Document Released: 09/26/2007 Document Revised: 09/15/2015 Document Reviewed: 08/04/2014  2017 Elsevier  Urinary Tract Infection, Adult A urinary tract infection (UTI) is an infection of any part of the urinary tract, which includes the kidneys, ureters, bladder, and urethra. These organs make, store, and get rid of urine in the body. UTI can be a bladder infection (cystitis) or kidney infection (pyelonephritis). What are the causes? This infection may be caused by fungi, viruses, or bacteria. Bacteria are the most common cause of UTIs. This condition can also be caused by repeated incomplete emptying of the bladder during urination. What increases the risk? This condition is more likely to develop if:  You ignore your need to urinate or hold urine for long periods of time.  You do not empty your bladder completely during urination.  You wipe back to front after urinating or having a bowel movement, if you are female.  You are uncircumcised, if you are female.  You are constipated.  You have a urinary catheter that stays in place (indwelling).  You have a weak defense (immune) system.  You have a medical condition that affects your bowels, kidneys, or bladder.  You have diabetes.  You  take antibiotic medicines frequently or for long periods of time, and the antibiotics no longer work well against certain types of infections (antibiotic resistance).  You take medicines that irritate your urinary tract.  You are exposed to chemicals that irritate your urinary tract.  You are female. What are the signs or symptoms? Symptoms of this condition include:  Fever.  Frequent urination or passing small amounts of urine  frequently.  Needing to urinate urgently.  Pain or burning with urination.  Urine that smells bad or unusual.  Cloudy urine.  Pain in the lower abdomen or back.  Trouble urinating.  Blood in the urine.  Vomiting or being less hungry than normal.  Diarrhea or abdominal pain.  Vaginal discharge, if you are female. How is this diagnosed? This condition is diagnosed with a medical history and physical exam. You will also need to provide a urine sample to test your urine. Other tests may be done, including:  Blood tests.  Sexually transmitted disease (STD) testing. If you have had more than one UTI, a cystoscopy or imaging studies may be done to determine the cause of the infections. How is this treated? Treatment for this condition often includes a combination of two or more of the following:  Antibiotic medicine.  Other medicines to treat less common causes of UTI.  Over-the-counter medicines to treat pain.  Drinking enough water to stay hydrated. Follow these instructions at home:  Take over-the-counter and prescription medicines only as told by your health care provider.  If you were prescribed an antibiotic, take it as told by your health care provider. Do not stop taking the antibiotic even if you start to feel better.  Avoid alcohol, caffeine, tea, and carbonated beverages. They can irritate your bladder.  Drink enough fluid to keep your urine clear or pale yellow.  Keep all follow-up visits as told by your health care provider. This is important.  Make sure to:  Empty your bladder often and completely. Do not hold urine for long periods of time.  Empty your bladder before and after sex.  Wipe from front to back after a bowel movement if you are female. Use each tissue one time when you wipe. Contact a health care provider if:  You have back pain.  You have a fever.  You feel nauseous or vomit.  Your symptoms do not get better after 3 days.  Your  symptoms go away and then return. Get help right away if:  You have severe back pain or lower abdominal pain.  You are vomiting and cannot keep down any medicines or water. This information is not intended to replace advice given to you by your health care provider. Make sure you discuss any questions you have with your health care provider. Document Released: 01/17/2005 Document Revised: 09/21/2015 Document Reviewed: 02/28/2015 Elsevier Interactive Patient Education  2017 [employer].

## 2016-05-06 NOTE — Discharge Summary (Signed)
Physician Discharge Summary  Kelli Jenkins [id number] DOB: 1960/12/03 DOA: 05/03/2016  PCP: No PCP Per Patient  Admit date: 05/03/2016 Discharge date: 05/06/2016  Time spent: 45 minutes  Recommendations for Outpatient Follow-up:  Patient will be discharged to home.   Patient will need to follow up with primary care provider within one week of discharge- appointment has been made for you on 05/22/2016 at 9am.  Your antibiotic will cost you $7-10 at [facility].   Patient should continue medications as prescribed.   Patient should follow a regular diet.   Discharge Diagnoses:  Sepsis secondary to Pneumonia/UTI UTI  Hypokalemia Normocytic anemia  Current smoker  Noncompliance/nonadherence  Discharge Condition: stable  Diet recommendation: regular  Filed Weights   05/04/16 0622 05/05/16 0445 05/06/16 0500  Weight: 98.6 kg (217 lb 6 oz) 98.1 kg (216 lb 4.3 oz) 96.8 kg (213 lb 6.5 oz)    History of present illness:  on 05/03/2016 by Dr. Joanne Gavel D Wrightis a 56 y.o.femalewith medical history significant foruterine cancer status post hysterectomy, cocaine abuse, and tobacco abuse who presents to the emergency department with productive cough, dyspnea, dysuria, and generalized weakness going back more than 2 weeks. Patient reports that she had been in her usual state until the insidious development of rhinorrhea and sinus congestion more than 2 weeks ago. She reports that this was accompanied by sore throat and she soon developed dyspnea and cough as well. Patient was seen in the emergency department for these complaints on 04/22/2016, diagnosed with a pneumonia, and discharged home with Levaquin and prednisone. Patient reports that she was unable to afford her medications and hoped that she would recover on her own. She reports some initial improvement without any specific treatment, but notes that over the past several days she has began to worsen again. She has also  developed dysuria in the past several days and has not attempted any interventions prior to coming in. She reports fevers, aches, and generalized weakness, but denies any chest pain, palpitations, change in vision or hearing, or loss of coordination.  Hospital Course:  Sepsis secondary to Pneumonia/UTI -Percent with fever, tachycardia, leukocytosis -Chest x-ray showed persistent but improved right lower lobe airspace disease -She had been prescribed Levaquin on 04/22/16, but reports she could not afford it; she had already completed a Z-pack at that time -BP is soft on presentation with mild tachycardia; lactate is reassuring at 0.86 and pt responded well to IVF  -Blood cultures show no growth to date -Urine culture >100K Ecoli, pansensitive -Received dose of levaquin in the ED -Currently on rocephin and doxycycline- will discharge patient with Levaquin.  -Sputum culture pending.  -Respiratory viral panel negative -Strep pneumonia urine antigen negative -Discontinued tamiflu  UTI  -Pt presents with dysuria and UA is suggestive of infection -Urine culture >100K GNR -prior urine cultures grew pan-sensitive E coli -Was placed on ceftriaxone, will discharge with levaquin  Hypokalemia -Potassium 4.5 today  Normocytic anemia  -Hemoglobin stable, close to baseline.  Currently 11.8 -Anemia panel: Iron 14, Ferritin 170 -Continue iron supplementation  Current smoker  -Discussed smoking cessation -Nicotine patch offered   Noncompliance/nonadherence -Last admission, patient was given appointment with the [facility] however failed to show up. Patient also failed to fill her medication siting it was too expensive. Patient was given a voucher this was confirmed with case management. Patient could have gone her Levaquin for $7 however again failed to get filled. -CM consulted to assist patient with PCP and medications  Consultants None  Procedures  None   Discharge  Exam: Vitals:   05/05/16 2122 05/06/16 0531  BP: 107/65 116/71  Pulse: 85 86  Resp: 20 20  Temp: 97.9 F (36.6 C) 97.5 F (36.4 C)   Continues to complain of sore throat, ear pain, congestion, cough, body aches.  Does not feel any better.  Denies chest pain, abdominal pain.  Feels cold.  Exam  General: Well developed, well nourished, NAD, appears stated age  62: NCAT, mucous membranes moist.   Cardiovascular: S1 S2 auscultated, RRR, no murmurs  Respiratory: Diminished but clear  Abdomen: Soft, nontender, nondistended, + bowel sounds  Extremities: warm dry without cyanosis clubbing or edema  Neuro: AAOx3, nonfocal  Psych: Normal affect and demeanor  Discharge Instructions Discharge Instructions    Discharge instructions    Complete by:  As directed    Patient will be discharged to home.   Patient will need to follow up with primary care provider within one week of discharge- appointment has been made for you on 05/22/2016 at 9am.  Your antibiotic will cost you $7-10 at [facility].   Patient should continue medications as prescribed.   Patient should follow a regular diet.     Current Discharge Medication List    START taking these medications   Details  ferrous sulfate 325 (65 FE) MG tablet Take 1 tablet (325 mg total) by mouth 2 (two) times daily with a meal. Qty: 60 tablet, Refills: 0    guaiFENesin (MUCINEX) 600 MG 12 hr tablet Take 2 tablets (1,200 mg total) by mouth 2 (two) times daily. Qty: 14 tablet, Refills: 0    nicotine (NICODERM CQ - DOSED IN MG/24 HOURS) 21 mg/24hr patch Place 1 patch (21 mg total) onto the skin daily. Qty: 28 patch, Refills: 0      CONTINUE these medications which have CHANGED   Details  levofloxacin (LEVAQUIN) 750 MG tablet Take 1 tablet (750 mg total) by mouth daily. Qty: 7 tablet, Refills: 0      STOP taking these medications     azithromycin (ZITHROMAX) 250 MG tablet      cephALEXin (KEFLEX) 500 MG capsule       HYDROcodone-homatropine (HYCODAN) 5-1.5 MG/5ML syrup      predniSONE (DELTASONE) 20 MG tablet        Allergies  Allergen Reactions  . Darvocet [Propoxyphene N-Acetaminophen] Swelling  . Ibuprofen Swelling  . Tramadol Swelling   Follow-up [facility] Follow up on 05/22/2016.   Why:  9a;You must show up since you have missed prior appts;photo id,$20 co pay. Contact information: [place] [phone]           The results of significant diagnostics from this hospitalization (including imaging, microbiology, ancillary and laboratory) are listed below for reference.    Significant Diagnostic Studies: Dg Chest 2 View  Result Date: 05/03/2016 CLINICAL DATA:  Coughing congestion.  Generalized weakness. EXAM: CHEST  2 VIEW COMPARISON:  04/22/2016 FINDINGS: Two-view exam shows persistent but improved right lower lobe airspace disease. Left lung clear. The cardiopericardial silhouette is within normal limits for size. The visualized bony structures of the thorax are intact. IMPRESSION: Persistent but improved right lower lobe airspace disease, possibly with superimposed atelectasis at this time. Electronically Signed   By: Misty Stanley M.D.   On: 05/03/2016 19:54   Dg Chest 2 View  Result Date: 04/22/2016 CLINICAL DATA:  PT states she is  having flu symptoms for 6 days. Also states pt is having a cough and weakness for 6 days. EXAM: CHEST  2 VIEW COMPARISON:  04/06/2016 FINDINGS: There is consolidation in the right lower lobe new since the prior exam, consistent with pneumonia. Remainder of the lungs is clear. Trace right pleural effusion. No left pleural effusion. No pneumothorax. Normal heart, mediastinum and hila. Skeletal structures are unremarkable. IMPRESSION: Right lower lobe pneumonia. Electronically Signed   By: Lajean Manes M.D.   On: 04/22/2016 12:02    Microbiology: Recent Results (from the past 240 hour(s))    Urine culture     Status: Abnormal   Collection Time: 05/03/16  6:00 PM  Result Value Ref Range Status   Specimen Description URINE, CLEAN CATCH  Final   Special Requests NONE  Final   Culture >=100,000 COLONIES/mL ESCHERICHIA COLI (A)  Final   Report Status 05/06/2016 FINAL  Final   Organism ID, Bacteria ESCHERICHIA COLI (A)  Final      Susceptibility   Escherichia coli - MIC*    AMPICILLIN <=2 SENSITIVE Sensitive     CEFAZOLIN <=4 SENSITIVE Sensitive     CEFTRIAXONE <=1 SENSITIVE Sensitive     CIPROFLOXACIN <=0.25 SENSITIVE Sensitive     GENTAMICIN <=1 SENSITIVE Sensitive     IMIPENEM <=0.25 SENSITIVE Sensitive     NITROFURANTOIN <=16 SENSITIVE Sensitive     TRIMETH/SULFA <=20 SENSITIVE Sensitive     AMPICILLIN/SULBACTAM <=2 SENSITIVE Sensitive     PIP/TAZO <=4 SENSITIVE Sensitive     Extended ESBL NEGATIVE Sensitive     * >=100,000 COLONIES/mL ESCHERICHIA COLI  Culture, blood (Routine x 2)     Status: None (Preliminary result)   Collection Time: 05/03/16  6:56 PM  Result Value Ref Range Status   Specimen Description BLOOD RIGHT ANTECUBITAL  Final   Special Requests BOTTLES DRAWN AEROBIC AND ANAEROBIC 5CC  Final   Culture   Final    NO GROWTH 3 DAYS Performed at [facility]    Report Status PENDING  Incomplete  Culture, blood (Routine x 2)     Status: None (Preliminary result)   Collection Time: 05/03/16  7:08 PM  Result Value Ref Range Status   Specimen Description BLOOD RIGHT HAND  Final   Special Requests BOTTLES DRAWN AEROBIC AND ANAEROBIC 5CC  Final   Culture   Final    NO GROWTH 3 DAYS Performed at [facility]    Report Status PENDING  Incomplete  Respiratory Panel by PCR     Status: None   Collection Time: 05/03/16 11:05 PM  Result Value Ref Range Status   Adenovirus NOT DETECTED NOT DETECTED Final   Coronavirus 229E NOT DETECTED NOT DETECTED Final   Coronavirus HKU1 NOT DETECTED NOT DETECTED Final   Coronavirus NL63 NOT DETECTED NOT DETECTED  Final   Coronavirus OC43 NOT DETECTED NOT DETECTED Final   Metapneumovirus NOT DETECTED NOT DETECTED Final   Rhinovirus / Enterovirus NOT DETECTED NOT DETECTED Final   Influenza A NOT DETECTED NOT DETECTED Final   Influenza B NOT DETECTED NOT DETECTED Final   Parainfluenza Virus 1 NOT DETECTED NOT DETECTED Final   Parainfluenza Virus 2 NOT DETECTED NOT DETECTED Final   Parainfluenza Virus 3 NOT DETECTED NOT DETECTED Final   Parainfluenza Virus 4 NOT DETECTED NOT DETECTED Final   Respiratory Syncytial Virus NOT DETECTED NOT DETECTED Final   Bordetella pertussis NOT DETECTED NOT DETECTED Final   Chlamydophila pneumoniae NOT DETECTED NOT DETECTED Final  Mycoplasma pneumoniae NOT DETECTED NOT DETECTED Final    Comment: Performed at [facility]  Culture, sputum-assessment     Status: None   Collection Time: 05/04/16 10:38 AM  Result Value Ref Range Status   Specimen Description SPUTUM  Final   Special Requests NONE  Final   Sputum evaluation THIS SPECIMEN IS ACCEPTABLE FOR SPUTUM CULTURE  Final   Report Status 05/04/2016 FINAL  Final  Culture, respiratory (NON-Expectorated)     Status: None   Collection Time: 05/04/16 10:38 AM  Result Value Ref Range Status   Specimen Description SPUTUM  Final   Special Requests NONE Reflexed from [id number]  Final   Gram Stain   Final    ABUNDANT WBC PRESENT, PREDOMINANTLY PMN RARE SQUAMOUS EPITHELIAL CELLS PRESENT MODERATE GRAM POSITIVE COCCI IN CHAINS FEW GRAM POSITIVE COCCOBACILLUS Performed at [facility]    Culture MODERATE GROUP A STREP (S.PYOGENES) ISOLATED  Final   Report Status 05/05/2016 FINAL  Final     Labs: Basic Metabolic Panel:  Recent Labs Lab 05/03/16 1856 05/05/16 0553 05/06/16 0515  NA 137 138 139  K 3.9 3.2* 4.5  CL 106 105 106  CO2 23 27 27   GLUCOSE 119* 105* 116*  BUN 8 17 21*  CREATININE 0.86 0.77 0.86  CALCIUM 8.9 8.8* 8.7*   Liver Function Tests:  Recent Labs Lab 05/03/16 1856  AST 15  ALT  15  ALKPHOS 76  BILITOT 0.6  PROT 7.0  ALBUMIN 3.6   No results for input(s): LIPASE, AMYLASE in the last 168 hours. No results for input(s): AMMONIA in the last 168 hours. CBC:  Recent Labs Lab 05/03/16 1856 05/04/16 0526 05/05/16 0553 05/06/16 0515  WBC 22.7* 16.8* 11.7* 9.7  NEUTROABS 19.5* 12.7*  --   --   HGB 11.6* 11.0* 11.0* 11.8*  HCT 34.9* 33.5* 33.3* 35.6*  MCV 91.1 92.8 90.5 93.7  PLT 347 285 325 346   Cardiac Enzymes: No results for input(s): CKTOTAL, CKMB, CKMBINDEX, TROPONINI in the last 168 hours. BNP: BNP (last 3 results)  Recent Labs  01/21/16 1145  BNP 29.4    ProBNP (last 3 results) No results for input(s): PROBNP in the last 8760 hours.  CBG: No results for input(s): GLUCAP in the last 168 hours.     SignedCristal Ford  [facility] 05/06/2016, 11:02 AM

## 2016-05-08 LAB — CULTURE, BLOOD (ROUTINE X 2)
Culture: NO GROWTH
Culture: NO GROWTH

## 2016-05-09 ENCOUNTER — Inpatient Hospital Stay: Payer: No Typology Code available for payment source | Admitting: Critical Care Medicine

## 2016-05-22 ENCOUNTER — Ambulatory Visit: Payer: No Typology Code available for payment source | Admitting: Family Medicine

## 2016-05-25 LAB — MISCELLANEOUS TEST - ANATOMIC PATHOLOGY

## 2016-06-02 ENCOUNTER — Encounter (HOSPITAL_COMMUNITY): Payer: Self-pay | Admitting: Nurse Practitioner

## 2016-06-02 ENCOUNTER — Emergency Department (HOSPITAL_COMMUNITY): Payer: No Typology Code available for payment source

## 2016-06-02 ENCOUNTER — Emergency Department (HOSPITAL_COMMUNITY)
Admission: EM | Admit: 2016-06-02 | Discharge: 2016-06-03 | Disposition: A | Discharge status: Home / Self Care | Payer: No Typology Code available for payment source | Attending: Emergency Medicine | Admitting: Emergency Medicine

## 2016-06-02 DIAGNOSIS — Z5181 Encounter for therapeutic drug level monitoring: Secondary | ICD-10-CM | POA: Insufficient documentation

## 2016-06-02 DIAGNOSIS — Z79899 Other long term (current) drug therapy: Secondary | ICD-10-CM | POA: Insufficient documentation

## 2016-06-02 DIAGNOSIS — F17210 Nicotine dependence, cigarettes, uncomplicated: Secondary | ICD-10-CM | POA: Insufficient documentation

## 2016-06-02 DIAGNOSIS — Z8542 Personal history of malignant neoplasm of other parts of uterus: Secondary | ICD-10-CM | POA: Insufficient documentation

## 2016-06-02 DIAGNOSIS — R112 Nausea with vomiting, unspecified: Secondary | ICD-10-CM | POA: Insufficient documentation

## 2016-06-02 DIAGNOSIS — H60312 Diffuse otitis externa, left ear: Secondary | ICD-10-CM | POA: Insufficient documentation

## 2016-06-02 DIAGNOSIS — R1110 Vomiting, unspecified: Secondary | ICD-10-CM

## 2016-06-02 LAB — URINALYSIS, ROUTINE W REFLEX MICROSCOPIC
Bilirubin Urine: NEGATIVE
GLUCOSE, UA: NEGATIVE mg/dL
Ketones, ur: 20 mg/dL — AB
NITRITE: NEGATIVE
PH: 6 (ref 5.0–8.0)
PROTEIN: NEGATIVE mg/dL
Specific Gravity, Urine: 1.021 (ref 1.005–1.030)

## 2016-06-02 LAB — COMPREHENSIVE METABOLIC PANEL
ALT: 17 U/L (ref 14–54)
AST: 17 U/L (ref 15–41)
Albumin: 4.6 g/dL (ref 3.5–5.0)
Alkaline Phosphatase: 85 U/L (ref 38–126)
Anion gap: 10 (ref 5–15)
BUN: 17 mg/dL (ref 6–20)
CHLORIDE: 101 mmol/L (ref 101–111)
CO2: 25 mmol/L (ref 22–32)
CREATININE: 1.01 mg/dL — AB (ref 0.44–1.00)
Calcium: 9.7 mg/dL (ref 8.9–10.3)
GFR calc Af Amer: >60 mL/min (ref >60)
GFR calc non Af Amer: >60 mL/min (ref >60)
Glucose, Bld: 110 mg/dL — ABNORMAL HIGH (ref 65–99)
POTASSIUM: 4.1 mmol/L (ref 3.5–5.1)
SODIUM: 136 mmol/L (ref 135–145)
Total Bilirubin: 0.8 mg/dL (ref 0.3–1.2)
Total Protein: 8.2 g/dL — ABNORMAL HIGH (ref 6.5–8.1)

## 2016-06-02 LAB — CBC WITH DIFFERENTIAL/PLATELET
Basophils Absolute: 0 10*3/uL (ref 0.0–0.1)
Basophils Relative: 0 %
EOS PCT: 0 %
Eosinophils Absolute: 0 10*3/uL (ref 0.0–0.7)
HEMATOCRIT: 43.1 % (ref 36.0–46.0)
Hemoglobin: 14.9 g/dL (ref 12.0–15.0)
LYMPHS ABS: 2.5 10*3/uL (ref 0.7–4.0)
LYMPHS PCT: 18 %
MCH: 31.8 pg (ref 26.0–34.0)
MCHC: 34.6 g/dL (ref 30.0–36.0)
MCV: 91.9 fL (ref 78.0–100.0)
MONO ABS: 0.8 10*3/uL (ref 0.1–1.0)
Monocytes Relative: 5 %
NEUTROS ABS: 11 10*3/uL — AB (ref 1.7–7.7)
Neutrophils Relative %: 77 %
PLATELETS: 307 10*3/uL (ref 150–400)
RBC: 4.69 MIL/uL (ref 3.87–5.11)
RDW: 14.5 % (ref 11.5–15.5)
WBC: 14.3 10*3/uL — AB (ref 4.0–10.5)

## 2016-06-02 LAB — I-STAT CG4 LACTIC ACID, ED
Lactic Acid, Venous: 1.5 mmol/L (ref 0.5–1.9)
Lactic Acid, Venous: 0.92 mmol/L (ref 0.5–1.9)

## 2016-06-02 LAB — PREGNANCY, URINE: Preg Test, Ur: NEGATIVE

## 2016-06-02 LAB — PROTIME-INR
INR: 0.97
Prothrombin Time: 12.8 seconds (ref 11.4–15.2)

## 2016-06-02 MED ORDER — SODIUM CHLORIDE 0.9 % IV BOLUS (SEPSIS)
1000.0000 mL | Freq: Once | INTRAVENOUS | Status: AC
  Administered 2016-06-02: 1000 mL via INTRAVENOUS

## 2016-06-02 MED ORDER — METOCLOPRAMIDE HCL 5 MG/ML IJ SOLN
10.0000 mg | Freq: Once | INTRAMUSCULAR | Status: AC
  Administered 2016-06-02: 10 mg via INTRAVENOUS
  Filled 2016-06-02: qty 2

## 2016-06-02 MED ORDER — ACETAMINOPHEN 500 MG PO TABS
1000.0000 mg | ORAL_TABLET | Freq: Once | ORAL | Status: AC
  Administered 2016-06-02: 1000 mg via ORAL
  Filled 2016-06-02: qty 2

## 2016-06-02 MED ORDER — FAMOTIDINE IN NACL 20-0.9 MG/50ML-% IV SOLN
20.0000 mg | Freq: Once | INTRAVENOUS | Status: AC
  Administered 2016-06-02: 20 mg via INTRAVENOUS
  Filled 2016-06-02: qty 50

## 2016-06-02 MED ORDER — ONDANSETRON HCL 4 MG/2ML IJ SOLN
4.0000 mg | Freq: Once | INTRAMUSCULAR | Status: AC
  Administered 2016-06-02: 4 mg via INTRAVENOUS
  Filled 2016-06-02: qty 2

## 2016-06-02 MED ORDER — PROMETHAZINE HCL 25 MG/ML IJ SOLN
12.5000 mg | Freq: Once | INTRAMUSCULAR | Status: AC
  Administered 2016-06-02: 12.5 mg via INTRAVENOUS
  Filled 2016-06-02: qty 1

## 2016-06-02 NOTE — ED Notes (Signed)
No respiratory or acute distress noted alert and oriented x 3 steady gait noted.

## 2016-06-02 NOTE — ED Triage Notes (Signed)
Pt states she diagnosed with pneumonia twice in the last month, once hospitalized but "begged to leave before she felt well." C/o severe malaise, N/V, coughing up green-yellow sputum, anorexia and myalgia.

## 2016-06-02 NOTE — ED Provider Notes (Signed)
[facility] DEPT Provider Note   CSN: [id number] Arrival date & time: 06/02/16  1642     History   Chief Complaint Chief Complaint  Patient presents with  . Emesis  . Reccurent Pneumonia    "Admitted Left too soon"    HPI Kelli Jenkins is a 56 y.o. female.  Patient is a 61 all female with a history of prior uterine cancer status post hysterectomy, tobacco use, cocaine abuse who presents with flulike symptoms. She states over the last 6-7 days she's had nausea and vomiting. She denies any diarrhea. She denies abdominal pain other than burning in her stomach. She states she hasn't been able keep anything down in the last week. She states her last 2-3 days she's had runny nose nasal congestion and coughing. She previously was admitted for pneumonia in January of this air. She has some intermittent bifrontal-type headaches. No known fevers at home but she is having chills. She has a little bit of burning on urination as well. She's been using over-the-counter medications without improvement in symptoms.      Past Medical History:  Diagnosis Date  . Cancer ([place])    uterine  . Cocaine addiction ([place])   . Hypokalemia   . Nephrolithiasis   . Pyelonephritis   . SIRS (systemic inflammatory response syndrome) ([facility])   . Tobacco abuse   . UTI (urinary tract infection) 06/2013    Patient Active Problem List   Diagnosis Date Noted  . CAP (community acquired pneumonia) 05/03/2016  . Normocytic anemia 05/03/2016  . Urinary tract infection without hematuria   . Opioid overdose 01/21/2016  . Somnolence 01/21/2016  . Cocaine abuse 01/21/2016  . Opioid abuse 01/21/2016  . UTI (urinary tract infection) 07/13/2013  . HCAP (healthcare-associated pneumonia) 08/16/2011  . Migraine 08/15/2011  . SIRS (systemic inflammatory response syndrome) ([place]) 08/13/2011  . Pyelonephritis 08/12/2011  . Ureteral colic 99991111  . Hypotension 08/12/2011  . Fever 08/12/2011  . Nephrolithiasis  08/12/2011  . Leukocytosis 08/12/2011  . Tobacco use disorder 08/12/2011    Past Surgical History:  Procedure Laterality Date  . RENAL ARTERY STENT     stones  . TUBAL LIGATION    . VAGINAL HYSTERECTOMY      OB History    No data available       Home Medications    Prior to Admission medications   Medication Sig Start Date End Date Taking? Authorizing Provider  ferrous sulfate 325 (65 FE) MG tablet Take 1 tablet (325 mg total) by mouth 2 (two) times daily with a meal. Patient not taking: Reported on 06/02/2016 05/06/16   Velta Addison Mikhail, DO  guaiFENesin (MUCINEX) 600 MG 12 hr tablet Take 2 tablets (1,200 mg total) by mouth 2 (two) times daily. Patient not taking: Reported on 06/02/2016 05/06/16   Velta Addison Mikhail, DO  levofloxacin (LEVAQUIN) 750 MG tablet Take 1 tablet (750 mg total) by mouth daily. Patient not taking: Reported on 06/02/2016 05/06/16   Velta Addison Mikhail, DO  neomycin-polymyxin-hydrocortisone (CORTISPORIN) 3.5-10000-1 otic suspension Place 4 drops into the left ear 4 (four) times daily. X 7 days 06/03/16   Malvin Johns, MD  nicotine (NICODERM CQ - DOSED IN MG/24 HOURS) 21 mg/24hr patch Place 1 patch (21 mg total) onto the skin daily. Patient not taking: Reported on 06/02/2016 05/07/16   Velta Addison Mikhail, DO  ondansetron (ZOFRAN ODT) 4 MG disintegrating tablet 4mg  ODT q4 hours prn nausea/vomit 06/03/16   Malvin Johns, MD    Family History History reviewed. No  pertinent family history.  Social History Social History  Substance Use Topics  . Smoking status: Current Every Day Smoker    Packs/day: 1.00    Types: Cigarettes  . Smokeless tobacco: Never Used  . Alcohol use Yes     Comment: occasionally     Allergies   Darvocet [propoxyphene n-acetaminophen]; Ibuprofen; and Tramadol   Review of Systems Review of Systems  Constitutional: Positive for chills and fatigue. Negative for diaphoresis and fever.  HENT: Positive for congestion and rhinorrhea. Negative for  sneezing.   Eyes: Negative.   Respiratory: Positive for cough. Negative for chest tightness and shortness of breath.   Cardiovascular: Negative for chest pain and leg swelling.  Gastrointestinal: Positive for nausea and vomiting. Negative for abdominal pain, blood in stool and diarrhea.  Genitourinary: Negative for difficulty urinating, flank pain, frequency and hematuria.  Musculoskeletal: Positive for myalgias. Negative for arthralgias and back pain.  Skin: Negative for rash.  Neurological: Positive for headaches. Negative for dizziness, speech difficulty, weakness and numbness.     Physical Exam Updated Vital Signs BP 113/75   Pulse 88   Temp 99.2 F (37.3 C) (Oral)   Resp 18   Ht 5\' 8"  (1.727 m)   Wt 209 lb (94.8 kg)   SpO2 95%   BMI 31.78 kg/m   Physical Exam  Constitutional: She is oriented to person, place, and time. She appears well-developed and well-nourished.  HENT:  Head: Normocephalic and atraumatic.  Left ear canal is mildly inflamed with some discharge in the canal. The TM appears normal. No pain over the mastoid  Eyes: Pupils are equal, round, and reactive to light.  Neck: Normal range of motion. Neck supple.  Cardiovascular: Normal rate, regular rhythm and normal heart sounds.   Pulmonary/Chest: Effort normal and breath sounds normal. No respiratory distress. She has no wheezes. She has no rales. She exhibits no tenderness.  Abdominal: Soft. Bowel sounds are normal. There is no tenderness. There is no rebound and no guarding.  Musculoskeletal: Normal range of motion. She exhibits no edema.  Lymphadenopathy:    She has no cervical adenopathy.  Neurological: She is alert and oriented to person, place, and time.  Skin: Skin is warm and dry. No rash noted.  Psychiatric: She has a normal mood and affect.     ED Treatments / Results  Labs (all labs ordered are listed, but only abnormal results are displayed) Labs Reviewed  COMPREHENSIVE METABOLIC PANEL -  Abnormal; Notable for the following:       Result Value   Glucose, Bld 110 (*)    Creatinine, Ser 1.01 (*)    Total Protein 8.2 (*)    All other components within normal limits  CBC WITH DIFFERENTIAL/PLATELET - Abnormal; Notable for the following:    WBC 14.3 (*)    Neutro Abs 11.0 (*)    All other components within normal limits  URINALYSIS, ROUTINE W REFLEX MICROSCOPIC - Abnormal; Notable for the following:    APPearance HAZY (*)    Hgb urine dipstick SMALL (*)    Ketones, ur 20 (*)    Leukocytes, UA TRACE (*)    Bacteria, UA RARE (*)    Squamous Epithelial / LPF 6-30 (*)    All other components within normal limits  CULTURE, BLOOD (ROUTINE X 2)  CULTURE, BLOOD (ROUTINE X 2)  URINE CULTURE  PROTIME-INR  PREGNANCY, URINE  I-STAT CG4 LACTIC ACID, ED  I-STAT CG4 LACTIC ACID, ED    EKG  EKG  Interpretation None       Radiology Dg Chest 2 View  Result Date: 06/02/2016 CLINICAL DATA:  Weakness and dizziness, fever cough EXAM: CHEST  2 VIEW COMPARISON:  05/03/2016 FINDINGS: The heart size and mediastinal contours are within normal limits. Both lungs are clear. The visualized skeletal structures are unremarkable. IMPRESSION: No active cardiopulmonary disease. Electronically Signed   By: Donavan Foil M.D.   On: 06/02/2016 18:09    Procedures Procedures (including critical care time)  Medications Ordered in ED Medications  sodium chloride 0.9 % bolus 1,000 mL (1,000 mLs Intravenous New Bag/Given 06/02/16 2058)  sodium chloride 0.9 % bolus 1,000 mL (0 mLs Intravenous Stopped 06/02/16 2358)  metoCLOPramide (REGLAN) injection 10 mg (10 mg Intravenous Given 06/02/16 2057)  famotidine (PEPCID) IVPB 20 mg premix (0 mg Intravenous Stopped 06/02/16 2127)  acetaminophen (TYLENOL) tablet 1,000 mg (1,000 mg Oral Given 06/02/16 2057)  promethazine (PHENERGAN) injection 12.5 mg (12.5 mg Intravenous Given 06/02/16 2243)  ondansetron (ZOFRAN) injection 4 mg (4 mg Intravenous Given 06/02/16 2330)      Initial Impression / Assessment and Plan / ED Course  I have reviewed the triage vital signs and the nursing notes.  Pertinent labs & imaging results that were available during my care of the patient were reviewed by me and considered in my medical decision making (see chart for details).    Patient presents with nausea and vomiting. There is no abdominal tenderness on exam. She has a very minimal bump in her creatinine. She was given IV fluids and antiemetics and has had no vomiting in the ED but still feels somewhat nauseated although she is tolerating oral fluids. Her vital signs are non-concerning. She has an elevation in her WBC count but it's frequently been elevated on prior evaluations as well. There is no evidence of pneumonia. Her urine doesn't appear consistent with acute infection, but was sent for culture.  She does have some evidence of left otitis externa and was started on drops for this. I also gave her prescription for Zofran. I feel that she is good for discharge. She was discharged home in good condition. She was advised to follow-up with the PCP. Resource guide was given. She's previously been scheduled for outpatient appointments, most recently at the [facility] but has missed her appointments. Return precautions were given.  Final Clinical Impressions(s) / ED Diagnoses   Final diagnoses:  Non-intractable vomiting, presence of nausea not specified, unspecified vomiting type  Acute diffuse otitis externa of left ear    New Prescriptions New Prescriptions   NEOMYCIN-POLYMYXIN-HYDROCORTISONE (CORTISPORIN) 3.5-10000-1 OTIC SUSPENSION    Place 4 drops into the left ear 4 (four) times daily. X 7 days   ONDANSETRON (ZOFRAN ODT) 4 MG DISINTEGRATING TABLET    4mg  ODT q4 hours prn nausea/vomit     Malvin Johns, MD 06/03/16 [phone]

## 2016-06-03 MED ORDER — NEOMYCIN-POLYMYXIN-HC 3.5-10000-1 OT SUSP: 4.0000 [drp] | Freq: Four times a day (QID) | OTIC | 0 refills | Status: DC

## 2016-06-03 MED ORDER — ONDANSETRON 4 MG PO TBDP: ORAL_TABLET | ORAL | 0 refills | Status: DC

## 2016-06-03 NOTE — ED Notes (Signed)
Pt has signed d/c and IV removed.  Pt states that she needs a minute in the room and to call a ride, offered to help her with this

## 2016-06-04 LAB — URINE CULTURE: Culture: <10000 — AB

## 2016-06-07 ENCOUNTER — Emergency Department (HOSPITAL_COMMUNITY): Payer: No Typology Code available for payment source

## 2016-06-07 ENCOUNTER — Observation Stay (HOSPITAL_COMMUNITY)
Admission: EM | Admit: 2016-06-07 | Discharge: 2016-06-09 | Disposition: A | Discharge status: Home / Self Care | Payer: No Typology Code available for payment source | Attending: Internal Medicine | Admitting: Internal Medicine

## 2016-06-07 ENCOUNTER — Encounter (HOSPITAL_COMMUNITY): Payer: Self-pay

## 2016-06-07 DIAGNOSIS — Z9889 Other specified postprocedural states: Secondary | ICD-10-CM | POA: Insufficient documentation

## 2016-06-07 DIAGNOSIS — Z8542 Personal history of malignant neoplasm of other parts of uterus: Secondary | ICD-10-CM | POA: Insufficient documentation

## 2016-06-07 DIAGNOSIS — R079 Chest pain, unspecified: Secondary | ICD-10-CM | POA: Diagnosis present

## 2016-06-07 DIAGNOSIS — Z886 Allergy status to analgesic agent status: Secondary | ICD-10-CM | POA: Insufficient documentation

## 2016-06-07 DIAGNOSIS — Z885 Allergy status to narcotic agent status: Secondary | ICD-10-CM | POA: Insufficient documentation

## 2016-06-07 DIAGNOSIS — Z6831 Body mass index (BMI) 31.0-31.9, adult: Secondary | ICD-10-CM | POA: Insufficient documentation

## 2016-06-07 DIAGNOSIS — R0789 Other chest pain: Secondary | ICD-10-CM | POA: Insufficient documentation

## 2016-06-07 DIAGNOSIS — E669 Obesity, unspecified: Secondary | ICD-10-CM | POA: Insufficient documentation

## 2016-06-07 DIAGNOSIS — F17210 Nicotine dependence, cigarettes, uncomplicated: Secondary | ICD-10-CM | POA: Insufficient documentation

## 2016-06-07 DIAGNOSIS — Z90710 Acquired absence of both cervix and uterus: Secondary | ICD-10-CM | POA: Insufficient documentation

## 2016-06-07 DIAGNOSIS — R112 Nausea with vomiting, unspecified: Principal | ICD-10-CM

## 2016-06-07 DIAGNOSIS — Z87442 Personal history of urinary calculi: Secondary | ICD-10-CM | POA: Insufficient documentation

## 2016-06-07 LAB — BASIC METABOLIC PANEL
Anion gap: 15 (ref 5–15)
BUN: 8 mg/dL (ref 6–20)
CHLORIDE: 98 mmol/L — AB (ref 101–111)
CO2: 21 mmol/L — AB (ref 22–32)
Calcium: 9.6 mg/dL (ref 8.9–10.3)
Creatinine, Ser: 0.94 mg/dL (ref 0.44–1.00)
GFR calc non Af Amer: >60 mL/min (ref >60)
Glucose, Bld: 107 mg/dL — ABNORMAL HIGH (ref 65–99)
POTASSIUM: 3.7 mmol/L (ref 3.5–5.1)
SODIUM: 134 mmol/L — AB (ref 135–145)

## 2016-06-07 LAB — CBC
HCT: 44 % (ref 36.0–46.0)
Hemoglobin: 15.3 g/dL — ABNORMAL HIGH (ref 12.0–15.0)
MCH: 31.6 pg (ref 26.0–34.0)
MCHC: 34.8 g/dL (ref 30.0–36.0)
MCV: 90.9 fL (ref 78.0–100.0)
Platelets: 299 10*3/uL (ref 150–400)
RBC: 4.84 MIL/uL (ref 3.87–5.11)
RDW: 14.3 % (ref 11.5–15.5)
WBC: 14.4 10*3/uL — ABNORMAL HIGH (ref 4.0–10.5)

## 2016-06-07 LAB — LIPASE, BLOOD: LIPASE: 27 U/L (ref 11–51)

## 2016-06-07 LAB — TROPONIN I: Troponin I: <0.03 ng/mL (ref <0.03)

## 2016-06-07 MED ORDER — ONDANSETRON 4 MG PO TBDP
ORAL_TABLET | ORAL | Status: AC
  Administered 2016-06-07: 4 mg
  Filled 2016-06-07: qty 1

## 2016-06-07 NOTE — ED Notes (Signed)
Patient endorses generalized chest tightness that goes down L arm with sharp, stabbing pains to her back as well as numbness down bilateral arms to her fingers. Pt states pressing (lying on her abdomen) makes it better. Pt reports having pneumonia twice last month.

## 2016-06-07 NOTE — ED Triage Notes (Signed)
Pt endorses central/left chest tightness for several days with radiation to left arm and back with n/v. Pt seen at [facility] 5 days ago for flu sx.

## 2016-06-07 NOTE — ED Notes (Addendum)
Pt is complaining of chest pain becoming worse.

## 2016-06-08 DIAGNOSIS — R112 Nausea with vomiting, unspecified: Secondary | ICD-10-CM

## 2016-06-08 DIAGNOSIS — R079 Chest pain, unspecified: Secondary | ICD-10-CM | POA: Diagnosis present

## 2016-06-08 DIAGNOSIS — F17210 Nicotine dependence, cigarettes, uncomplicated: Secondary | ICD-10-CM

## 2016-06-08 DIAGNOSIS — A084 Viral intestinal infection, unspecified: Secondary | ICD-10-CM

## 2016-06-08 DIAGNOSIS — F1411 Cocaine abuse, in remission: Secondary | ICD-10-CM

## 2016-06-08 DIAGNOSIS — Z90710 Acquired absence of both cervix and uterus: Secondary | ICD-10-CM

## 2016-06-08 DIAGNOSIS — Z8542 Personal history of malignant neoplasm of other parts of uterus: Secondary | ICD-10-CM

## 2016-06-08 DIAGNOSIS — F1111 Opioid abuse, in remission: Secondary | ICD-10-CM

## 2016-06-08 DIAGNOSIS — E669 Obesity, unspecified: Secondary | ICD-10-CM

## 2016-06-08 DIAGNOSIS — Z590 Homelessness: Secondary | ICD-10-CM

## 2016-06-08 LAB — URINALYSIS, ROUTINE W REFLEX MICROSCOPIC
BILIRUBIN URINE: NEGATIVE
GLUCOSE, UA: NEGATIVE mg/dL
KETONES UR: 5 mg/dL — AB
LEUKOCYTES UA: NEGATIVE
Nitrite: NEGATIVE
PROTEIN: NEGATIVE mg/dL
Specific Gravity, Urine: 1.015 (ref 1.005–1.030)
pH: 6 (ref 5.0–8.0)

## 2016-06-08 LAB — DIFFERENTIAL
Basophils Absolute: 0 10*3/uL (ref 0.0–0.1)
Basophils Relative: 0 %
EOS ABS: 0.1 10*3/uL (ref 0.0–0.7)
EOS PCT: 1 %
LYMPHS PCT: 22 %
Lymphs Abs: 3.2 10*3/uL (ref 0.7–4.0)
MONO ABS: 0.8 10*3/uL (ref 0.1–1.0)
Monocytes Relative: 5 %
NEUTROS PCT: 72 %
Neutro Abs: 10.5 10*3/uL — ABNORMAL HIGH (ref 1.7–7.7)

## 2016-06-08 LAB — CULTURE, BLOOD (ROUTINE X 2)
CULTURE: NO GROWTH
Culture: NO GROWTH

## 2016-06-08 LAB — TROPONIN I

## 2016-06-08 LAB — RAPID URINE DRUG SCREEN, HOSP PERFORMED
Amphetamines: NOT DETECTED
BARBITURATES: NOT DETECTED
BENZODIAZEPINES: POSITIVE — AB
Cocaine: POSITIVE — AB
Opiates: NOT DETECTED
Tetrahydrocannabinol: NOT DETECTED

## 2016-06-08 LAB — D-DIMER, QUANTITATIVE (NOT AT ARMC): D DIMER QUANT: 0.32 ug{FEU}/mL (ref 0.00–0.50)

## 2016-06-08 MED ORDER — ONDANSETRON HCL 4 MG/2ML IJ SOLN
4.0000 mg | Freq: Once | INTRAMUSCULAR | Status: AC
  Administered 2016-06-08: 4 mg via INTRAVENOUS
  Filled 2016-06-08: qty 2

## 2016-06-08 MED ORDER — HALOPERIDOL LACTATE 5 MG/ML IJ SOLN
5.0000 mg | Freq: Once | INTRAMUSCULAR | Status: AC
  Administered 2016-06-08: 5 mg via INTRAVENOUS
  Filled 2016-06-08: qty 1

## 2016-06-08 MED ORDER — KETOROLAC TROMETHAMINE 30 MG/ML IJ SOLN
30.0000 mg | Freq: Once | INTRAMUSCULAR | Status: AC
  Administered 2016-06-08: 30 mg via INTRAVENOUS
  Filled 2016-06-08: qty 1

## 2016-06-08 MED ORDER — SODIUM CHLORIDE 0.9 % IV BOLUS (SEPSIS)
1000.0000 mL | Freq: Once | INTRAVENOUS | Status: AC
  Administered 2016-06-08: 1000 mL via INTRAVENOUS

## 2016-06-08 MED ORDER — KETOROLAC TROMETHAMINE 15 MG/ML IJ SOLN
15.0000 mg | Freq: Once | INTRAMUSCULAR | Status: AC
  Administered 2016-06-08: 15 mg via INTRAVENOUS
  Filled 2016-06-08: qty 1

## 2016-06-08 MED ORDER — DIPHENHYDRAMINE HCL 50 MG/ML IJ SOLN
25.0000 mg | Freq: Once | INTRAMUSCULAR | Status: AC
  Administered 2016-06-08: 25 mg via INTRAVENOUS
  Filled 2016-06-08: qty 1

## 2016-06-08 MED ORDER — METOCLOPRAMIDE HCL 5 MG/ML IJ SOLN
10.0000 mg | Freq: Once | INTRAMUSCULAR | Status: AC
  Administered 2016-06-08: 10 mg via INTRAVENOUS
  Filled 2016-06-08: qty 2

## 2016-06-08 MED ORDER — SODIUM CHLORIDE 0.9 % IV SOLN
INTRAVENOUS | Status: AC
  Administered 2016-06-08: 10:00:00 via INTRAVENOUS

## 2016-06-08 MED ORDER — KETOROLAC TROMETHAMINE 15 MG/ML IJ SOLN
15.0000 mg | Freq: Four times a day (QID) | INTRAMUSCULAR | Status: DC | PRN
  Administered 2016-06-08 – 2016-06-09 (×3): 15 mg via INTRAVENOUS
  Filled 2016-06-08 (×3): qty 1

## 2016-06-08 MED ORDER — ENOXAPARIN SODIUM 40 MG/0.4ML ~~LOC~~ SOLN
40.0000 mg | SUBCUTANEOUS | Status: DC
  Administered 2016-06-08 – 2016-06-09 (×2): 40 mg via SUBCUTANEOUS
  Filled 2016-06-08 (×2): qty 0.4

## 2016-06-08 MED ORDER — GI COCKTAIL ~~LOC~~
30.0000 mL | Freq: Three times a day (TID) | ORAL | Status: DC | PRN
  Administered 2016-06-08 – 2016-06-09 (×2): 30 mL via ORAL
  Filled 2016-06-08 (×2): qty 30

## 2016-06-08 MED ORDER — GI COCKTAIL ~~LOC~~
30.0000 mL | Freq: Once | ORAL | Status: AC
  Administered 2016-06-08: 30 mL via ORAL
  Filled 2016-06-08: qty 30

## 2016-06-08 MED ORDER — PROMETHAZINE HCL 25 MG/ML IJ SOLN
12.5000 mg | Freq: Four times a day (QID) | INTRAMUSCULAR | Status: DC | PRN
  Administered 2016-06-08 – 2016-06-09 (×2): 12.5 mg via INTRAVENOUS
  Filled 2016-06-08 (×2): qty 1

## 2016-06-08 MED ORDER — PROMETHAZINE HCL 25 MG/ML IJ SOLN
25.0000 mg | Freq: Once | INTRAMUSCULAR | Status: AC
  Administered 2016-06-08: 25 mg via INTRAVENOUS
  Filled 2016-06-08: qty 1

## 2016-06-08 MED ORDER — ACETAMINOPHEN 325 MG PO TABS
650.0000 mg | ORAL_TABLET | Freq: Once | ORAL | Status: AC
  Administered 2016-06-08: 650 mg via ORAL
  Filled 2016-06-08: qty 2

## 2016-06-08 NOTE — H&P (Signed)
Date: 06/08/2016               Patient Name:  Kelli Jenkins MRN: [id number]  DOB: 04-Feb-1961 Age / Sex: 56 y.o., female   PCP: No Pcp Per Patient         Medical Service: Internal Medicine Teaching Service         Attending Physician: Dr. Oval Linsey, MD    First Contact: Dr. Jari Favre Pager: [phone]  Second Contact: Dr. Charlynn Grimes Pager: [phone]       After Hours (After 5p/  First Contact Pager: [phone]  weekends / holidays): Second Contact Pager: [phone]   Chief Complaint: chest pain, nausea  History of Present Illness:   56 yo female with hx of uterine cancer s/p hysterectomy, polysubstance abuse including cocaine and opioid in the past, self reported borderline diabetes, presents with 5-6 days of chest pain and nausea/vomitting. CP is located all across her chest, feels tight and pressure like, radiates to her back, has been getting worse for the last few days. No modifying factors, has not tried anything for the CP. Vomiting started 5-6 days ago as well, does not recall any clear onset, has been vomiting throughout the day for last several days. Unable to keep anything down, vomit is mostly clear. No diarrhea, no recent travel, no unusual food intake, no one else with similar symptoms. Had a low grade fever of 100.5 two days ago. She has been staying in her car for last few weeks, separate from her husband and does not have any place to stay. No cough, sob, headache, sinus pain, dizziness, dysuria, or any other symptoms currently.   EKG no ST changes, trop negativex2, ddimer negative. Lipase normal. Received several meds in the ED to help with her nausea including phenergan, zofran, reglan, benadryl, and haldol. Haldol seemed to help and she is tired and sleepy currently.   Meds:  Current Meds  Medication Sig  . neomycin-polymyxin-hydrocortisone (CORTISPORIN) 3.5-10000-1 otic suspension Place 4 drops into the left ear 4 (four) times daily. X 7 days      Allergies: Allergies as of 06/07/2016 - Review Complete 06/07/2016  Allergen Reaction Noted  . Darvocet [propoxyphene n-acetaminophen] Swelling 04/12/2011  . Ibuprofen Swelling 04/12/2011  . Tramadol Swelling 01/16/2014   Past Medical History:  Diagnosis Date  . Cancer ([place])    uterine  . Cocaine addiction ([place])   . Hypokalemia   . Nephrolithiasis   . Pyelonephritis   . SIRS (systemic inflammatory response syndrome) (HCC)   . Tobacco abuse   . UTI (urinary tract infection) 06/2013    Family History:  History reviewed. No pertinent family history.   Social History:  Social History   Social History  . Marital status: Married    Spouse name: N/A  . Number of children: N/A  . Years of education: N/A   Social History Main Topics  . Smoking status: Current Every Day Smoker    Packs/day: 1.00    Types: Cigarettes  . Smokeless tobacco: Never Used  . Alcohol use Yes     Comment: occasionally  . Drug use: No     Comment: HISTORY OF COCAINE USE  . Sexual activity: No   Other Topics Concern  . None   Social History Narrative  . None     Review of Systems: A complete ROS was negative except as per HPI.   Physical Exam: Blood pressure 152/85, pulse 72, temperature 98.5 F (36.9 C), temperature source Oral,  resp. rate 18, height 5\' 8"  (1.727 m), weight 209 lb (94.8 kg), SpO2 96 %. Physical Exam  Constitutional: She is oriented to person, place, and time. She appears well-developed and well-nourished.  Obese female, lying in bed, appears tired, falling asleep frequently during the interview but easily aroused. NAD.   HENT:  Head: Normocephalic and atraumatic.  Mouth/Throat: Oropharynx is clear and moist. No oropharyngeal exudate.  Eyes: Conjunctivae are normal. Right eye exhibits no discharge. Left eye exhibits no discharge.  Cardiovascular: Exam reveals no gallop and no friction rub.   No murmur heard. Heart sounds distant likely due to body habitus.    Respiratory: Effort normal and breath sounds normal. No respiratory distress. She has no wheezes. She has no rales.  Has chest wall tenderness throughout the chest on palpation.   GI:  abd is soft, non distended, normal bowel sound, no tenderness.   Musculoskeletal: Normal range of motion. She exhibits no edema.  Neurological: She is alert and oriented to person, place, and time. No cranial nerve deficit.  Psychiatric: She has a normal mood and affect.     EKG: sinus tach. No ST changes.   CXR: no effusion or infiltrates.   Assessment & Plan by Problem: Principal Problem:   Nausea & vomiting Active Problems:   Chest pain  56 yo female is here with intractable n/v x5-6 days along with atypical chest pain.  Atypical chest pain No clear onset, has been going on for 5-6 days, gradually worsening, located throughout the chest along with some radiation to the back. CXR is unremarkable, no mediastinal widening to chest aortic dissection and timeline is not suggestive of such. No hx of chest pain in the past, no cardiac hx. EKG has no ischemic changes, trop negx2. Has reproducible chest wall tenderness on palpation.  CP is likely from her intractable vomiting. - will cont to monitor on tele. No further workup at this point.    Intractable n/v Unclear etiology. No diarrhea, no sick contact, no unsual food intake. Al though she states having vomiting throughout the day for last 6 days, she does not appear to be significantly dry on exam and does not have any electrolyte abnormalities.  Nausea seems to have improved with all the antiemetics in the ED including   Low suspicion for infectious cause. Does report hx of borderline diabetes but no other hx.  - will check hgba1c to assess her diabetes status, if high, could suggest diabetic gastroparesis - check UDS for presence of THC.  - supportive care for now with anti-emetics and gentle IVF - check EKG in AM to monitor QTC.  Screening -  check HIV and Hep C  Dispo: Admit patient to Observation with expected length of stay less than 2 midnights.  Signed: Dellia Nims, MD 06/08/2016, 8:23 AM  Pager: [phone]

## 2016-06-08 NOTE — Care Management Note (Addendum)
Case Management Note  Patient Details  Name: Kelli Jenkins MRN: [id number] Date of Birth: 1960/11/27  Subjective/Objective:                  From home with spouse. /56 y.o. female who presents to the Emergency Department complaining of worsening intermittent chest pain onset 3-4 days.  Action/Plan: Admit status OBSERVATION (Atypical chest pain); anticipate discharge [place].   Expected Discharge Date:   (UNKNOWN)               Expected Discharge Plan:  Home/Self Care  In-House Referral:  NA  Discharge planning Services  CM Consult, Medication Assistance  Post Acute Care Choice:    Choice offered to:     DME Arranged:    DME Agency:     HH Arranged:    HH Agency:     Status of Service:  In process, will continue to follow  If discussed at Long Length of Stay Meetings, dates discussed:    Additional Comments: Pt has GCCN (orange card). Fuller Mandril, RN 06/08/2016, 9:49 AM

## 2016-06-08 NOTE — Progress Notes (Signed)
Pt c/ of N/V, wanted to have liquids and food despite N/V. MD instructed RN to order pt clear liquid diet will continue to monitor. Etta Quill, RN

## 2016-06-08 NOTE — ED Provider Notes (Signed)
[facility] DEPT Provider Note   CSN: [id number] Arrival date & time: 06/07/16  2055  By signing my name below, I, Kelli Jenkins, attest that this documentation has been prepared under the direction and in the presence of Delora Fuel, MD  Electronically Signed: Delton Jenkins, ED Scribe. 06/08/16. 12:55 AM.   History   Chief Complaint Chief Complaint  Patient presents with  . Chest Pain   The history is provided by the patient. No language interpreter was used.   HPI Comments:  Kelli Jenkins is a 56 y.o. female who presents to the Emergency Department complaining of worsening intermittent chest pain onset 3-4 days. She notes her pain radiates to her back and describes this as as stabbing pain. Her pain is worse with deep breathing and when twisting her body. Pt also reports chills, fevers (tmax 100.3), diaphoresis, generalized body aches, dizziness, a productive cough with white sputum, abdominal pain, nausea and vomiting. Pt reports she last used cocaine a few weeks ago. She has taken 1/2 a xanax pill with no relief. Pt denies any other associated symptoms at this time. No PCP noted.   Past Medical History:  Diagnosis Date  . Cancer ([place])    uterine  . Cocaine addiction ([place])   . Hypokalemia   . Nephrolithiasis   . Pyelonephritis   . SIRS (systemic inflammatory response syndrome) (H[facility])   . Tobacco abuse   . UTI (urinary tract infection) 06/2013    Patient Active Problem List   Diagnosis Date Noted  . CAP (community acquired pneumonia) 05/03/2016  . Normocytic anemia 05/03/2016  . Urinary tract infection without hematuria   . Opioid overdose 01/21/2016  . Somnolence 01/21/2016  . Cocaine abuse 01/21/2016  . Opioid abuse 01/21/2016  . UTI (urinary tract infection) 07/13/2013  . HCAP (healthcare-associated pneumonia) 08/16/2011  . Migraine 08/15/2011  . SIRS (systemic inflammatory response syndrome) ([place]) 08/13/2011  . Pyelonephritis 08/12/2011  . Ureteral colic  99991111  . Hypotension 08/12/2011  . Fever 08/12/2011  . Nephrolithiasis 08/12/2011  . Leukocytosis 08/12/2011  . Tobacco use disorder 08/12/2011    Past Surgical History:  Procedure Laterality Date  . RENAL ARTERY STENT     stones  . TUBAL LIGATION    . VAGINAL HYSTERECTOMY      OB History    No data available       Home Medications    Prior to Admission medications   Medication Sig Start Date End Date Taking? Authorizing Provider  ferrous sulfate 325 (65 FE) MG tablet Take 1 tablet (325 mg total) by mouth 2 (two) times daily with a meal. Patient not taking: Reported on 06/02/2016 05/06/16   Velta Addison Mikhail, DO  guaiFENesin (MUCINEX) 600 MG 12 hr tablet Take 2 tablets (1,200 mg total) by mouth 2 (two) times daily. Patient not taking: Reported on 06/02/2016 05/06/16   Velta Addison Mikhail, DO  levofloxacin (LEVAQUIN) 750 MG tablet Take 1 tablet (750 mg total) by mouth daily. Patient not taking: Reported on 06/02/2016 05/06/16   Velta Addison Mikhail, DO  neomycin-polymyxin-hydrocortisone (CORTISPORIN) 3.5-10000-1 otic suspension Place 4 drops into the left ear 4 (four) times daily. X 7 days 06/03/16   Malvin Johns, MD  nicotine (NICODERM CQ - DOSED IN MG/24 HOURS) 21 mg/24hr patch Place 1 patch (21 mg total) onto the skin daily. Patient not taking: Reported on 06/02/2016 05/07/16   Velta Addison Mikhail, DO  ondansetron (ZOFRAN ODT) 4 MG disintegrating tablet 4mg  ODT q4 hours prn nausea/vomit 06/03/16   Malvin Johns, MD  Family History History reviewed. No pertinent family history.  Social History Social History  Substance Use Topics  . Smoking status: Current Every Day Smoker    Packs/day: 1.00    Types: Cigarettes  . Smokeless tobacco: Never Used  . Alcohol use Yes     Comment: occasionally     Allergies   Darvocet [propoxyphene n-acetaminophen]; Ibuprofen; and Tramadol   Review of Systems Review of Systems  Constitutional: Positive for chills, diaphoresis and fever.    Respiratory: Positive for cough.   Cardiovascular: Positive for chest pain.  Gastrointestinal: Positive for abdominal pain, nausea and vomiting.  Musculoskeletal: Positive for myalgias.  Neurological: Positive for dizziness.  All other systems reviewed and are negative.  Physical Exam Updated Vital Signs BP 125/96   Pulse 93   Temp 98.5 F (36.9 C) (Oral)   Resp 15   Ht 5\' 8"  (1.727 m)   Wt 209 lb (94.8 kg)   SpO2 96%   BMI 31.78 kg/m   Physical Exam  Constitutional: She is oriented to person, place, and time. She appears well-developed and well-nourished.  HENT:  Head: Normocephalic and atraumatic.  Eyes: EOM are normal. Pupils are equal, round, and reactive to light.  Neck: Normal range of motion. Neck supple. No JVD present.  Cardiovascular: Normal rate, regular rhythm and normal heart sounds.   No murmur heard. Pulmonary/Chest: Effort normal and breath sounds normal. She has no wheezes. She has no rales. She exhibits tenderness.  Moderate anterior chest wall  tenderness  Abdominal: Soft. She exhibits no distension and no mass. Bowel sounds are decreased. There is tenderness in the epigastric area.  Musculoskeletal: Normal range of motion. She exhibits no edema.  Lymphadenopathy:    She has no cervical adenopathy.  Neurological: She is alert and oriented to person, place, and time. No cranial nerve deficit. She exhibits normal muscle tone. Coordination normal.  Skin: Skin is warm and dry. No rash noted.  Psychiatric: She has a normal mood and affect. Her behavior is normal. Judgment and thought content normal.  Nursing note and vitals reviewed.  ED Treatments / Results  DIAGNOSTIC STUDIES:  Oxygen Saturation is 96% on RA, normal by my interpretation.    COORDINATION OF CARE:  12:53 AM Discussed treatment plan with pt at bedside and pt agreed to plan.  Labs (all labs ordered are listed, but only abnormal results are displayed) Labs Reviewed  BASIC METABOLIC  PANEL - Abnormal; Notable for the following:       Result Value   Sodium 134 (*)    Chloride 98 (*)    CO2 21 (*)    Glucose, Bld 107 (*)    All other components within normal limits  CBC - Abnormal; Notable for the following:    WBC 14.4 (*)    Hemoglobin 15.3 (*)    All other components within normal limits  DIFFERENTIAL - Abnormal; Notable for the following:    Neutro Abs 10.5 (*)    All other components within normal limits  TROPONIN I  LIPASE, BLOOD  D-DIMER, QUANTITATIVE (NOT AT [facility])  TROPONIN I  URINALYSIS, ROUTINE W REFLEX MICROSCOPIC    EKG  EKG Interpretation  Date/Time:  Thursday June 07 2016 20:59:15 EST Ventricular Rate:  111 PR Interval:  150 QRS Duration: 74 QT Interval:  340 QTC Calculation: 462 R Axis:   89 Text Interpretation:  Sinus tachycardia Right atrial enlargement Borderline ECG When compared with ECG of 04/22/2016, No significant change was found Confirmed by  Roxanne Mins  MD, [place] ([id number]) on 06/07/2016 11:45:38 PM       Radiology Dg Chest 2 View  Result Date: 06/07/2016 CLINICAL DATA:  Acute onset of central and left-sided chest tightness, radiating to the left arm and back. Nausea and vomiting. Initial encounter. EXAM: CHEST  2 VIEW COMPARISON:  Chest radiograph performed 06/02/2016 FINDINGS: The lungs are well-aerated. Mild peribronchial thickening is noted. There is no evidence of focal opacification, pleural effusion or pneumothorax. The heart is normal in size; the mediastinal contour is within normal limits. No acute osseous abnormalities are seen. IMPRESSION: Mild peribronchial thickening noted.  Lungs otherwise grossly clear. Electronically Signed   By: Garald Balding M.D.   On: 06/07/2016 21:35    Procedures Procedures (including critical care time)  Medications Ordered in ED Medications  ondansetron (ZOFRAN-ODT) 4 MG disintegrating tablet (4 mg  Given 06/07/16 2106)     Initial Impression / Assessment and Plan / ED Course  I have  reviewed the triage vital signs and the nursing notes.  Pertinent labs & imaging results that were available during my care of the patient were reviewed by me and considered in my medical decision making (see chart for details).  Chest pain which appears to be chest wall pain. Epigastric pain with nausea and vomiting. Old records are reviewed, and she has recent hospitalization for community-acquired pneumonia. Also, she has a history of narcotic abuse. She states she does not wish narcotics but that ketorolac and been helpful when she was in the hospital for. She is given IV fluids, IV ondansetron, and IV ketorolac but she states that symptoms are not any better. She is given oral acetaminophen and she was given IV metoclopramide with no improvement. D-dimer was obtained and was negative. Repeat troponin is also negative. She is given IV promethazine and still is having problems with nausea and states she is not holding anything down. Decision was made to admit her because of inability to control nausea. I very low index of suspicion that her chest pain is anything other than chest wall pain. Case is discussed with Dr. Charlynn Grimes of internal medicine teaching service who agrees to admit the patient.  Final Clinical Impressions(s) / ED Diagnoses   Final diagnoses:  Intractable vomiting with nausea, unspecified vomiting type  Chest wall pain    New Prescriptions New Prescriptions   No medications on file   I personally performed the services described in this documentation, which was scribed in my presence. The recorded information has been reviewed and is accurate.       Delora Fuel, MD [id number]

## 2016-06-09 DIAGNOSIS — R079 Chest pain, unspecified: Secondary | ICD-10-CM

## 2016-06-09 DIAGNOSIS — G43A1 Cyclical vomiting, intractable: Secondary | ICD-10-CM

## 2016-06-09 DIAGNOSIS — Z886 Allergy status to analgesic agent status: Secondary | ICD-10-CM

## 2016-06-09 DIAGNOSIS — Z885 Allergy status to narcotic agent status: Secondary | ICD-10-CM

## 2016-06-09 DIAGNOSIS — R112 Nausea with vomiting, unspecified: Secondary | ICD-10-CM

## 2016-06-09 LAB — HEPATITIS C ANTIBODY (REFLEX)

## 2016-06-09 LAB — HCV COMMENT:

## 2016-06-09 LAB — CBC
HEMATOCRIT: 39.4 % (ref 36.0–46.0)
Hemoglobin: 13.2 g/dL (ref 12.0–15.0)
MCH: 30.9 pg (ref 26.0–34.0)
MCHC: 33.5 g/dL (ref 30.0–36.0)
MCV: 92.3 fL (ref 78.0–100.0)
Platelets: 239 10*3/uL (ref 150–400)
RBC: 4.27 MIL/uL (ref 3.87–5.11)
RDW: 13.9 % (ref 11.5–15.5)
WBC: 8.9 10*3/uL (ref 4.0–10.5)

## 2016-06-09 LAB — BASIC METABOLIC PANEL
Anion gap: 10 (ref 5–15)
BUN: 7 mg/dL (ref 6–20)
CHLORIDE: 103 mmol/L (ref 101–111)
CO2: 25 mmol/L (ref 22–32)
CREATININE: 0.83 mg/dL (ref 0.44–1.00)
Calcium: 8.7 mg/dL — ABNORMAL LOW (ref 8.9–10.3)
GFR calc non Af Amer: >60 mL/min (ref >60)
Glucose, Bld: 99 mg/dL (ref 65–99)
POTASSIUM: 3.5 mmol/L (ref 3.5–5.1)
SODIUM: 138 mmol/L (ref 135–145)

## 2016-06-09 LAB — INFLUENZA PANEL BY PCR (TYPE A & B)
Influenza A By PCR: NEGATIVE
Influenza B By PCR: NEGATIVE

## 2016-06-09 LAB — HIV ANTIBODY (ROUTINE TESTING W REFLEX): HIV SCREEN 4TH GENERATION: NONREACTIVE

## 2016-06-09 LAB — HEMOGLOBIN A1C
Hgb A1c MFr Bld: 5.2 % (ref 4.8–5.6)
Mean Plasma Glucose: 103 mg/dL

## 2016-06-09 MED ORDER — PANTOPRAZOLE SODIUM 40 MG PO TBEC
40.0000 mg | DELAYED_RELEASE_TABLET | Freq: Every day | ORAL | Status: DC
  Administered 2016-06-09: 40 mg via ORAL
  Filled 2016-06-09: qty 1

## 2016-06-09 MED ORDER — PANTOPRAZOLE SODIUM 40 MG PO TBEC: 40.0000 mg | DELAYED_RELEASE_TABLET | Freq: Every day | ORAL | 2 refills | Status: DC

## 2016-06-09 MED ORDER — DICLOFENAC SODIUM 1 % TD GEL
4.0000 g | Freq: Four times a day (QID) | TRANSDERMAL | Status: DC
  Administered 2016-06-09: 4 g via TOPICAL
  Filled 2016-06-09: qty 100

## 2016-06-09 MED ORDER — DICLOFENAC SODIUM 1 % TD GEL: 4.0000 g | Freq: Four times a day (QID) | TRANSDERMAL | 0 refills | Status: DC

## 2016-06-09 MED ORDER — ACETAMINOPHEN 325 MG PO TABS: 650.0000 mg | ORAL_TABLET | Freq: Four times a day (QID) | ORAL | Status: DC | PRN

## 2016-06-09 MED ORDER — PROMETHAZINE HCL 12.5 MG PO TABS: 12.5000 mg | ORAL_TABLET | Freq: Four times a day (QID) | ORAL | 0 refills | Status: DC | PRN

## 2016-06-09 NOTE — Progress Notes (Signed)
   Subjective:  Patient states she still has some intermittent nausea and chest pain. She had one episode of vomiting last night after advancing her diet too quickly. She endorses myalgias; denies other infectious symptoms or sick contacts.   She was able to tolerate smaller amounts of breakfast this morning.  Objective:  Vital signs in last 24 hours: Vitals:   06/09/16 0045 06/09/16 0454 06/09/16 0525 06/09/16 0802  BP: (!) 141/80 (!) 145/91 (!) 137/104 121/66  Pulse: 78 70 76   Resp: 18  18   Temp: 98.5 F (36.9 C)  98.5 F (36.9 C) 98.1 F (36.7 C)  TempSrc: Oral  Oral Oral  SpO2: 95% 97% 97%   Weight:      Height:       Constitutional: NAD CV: RRR, no murmurs, rubs or gallops Resp: no increased work of breathing, CTAB Abd: +BS, NDNT  Assessment/Plan:  Principal Problem:   Nausea & vomiting Active Problems:   Chest pain  Nausea and vomiting: Patient with nausea and vomiting that seems to be improving. She has been able to tolerate some clear liquid diet this morning and has not had further vomiting since last night. She did endorse some myalgias today. Labs, vital signs and exam do not show dehydration which is reassuring. Hgb A1c was <6, so gastroparesis less likely.  --phenergan PRN; tylenol PRN --clear diet, advance as tolerated --flu panel  Musculoskeletal chest pain: Patient with intermittent chest discomfort that is reproducible on exam and exacerbated by movement. Cardiology evaluated patient this AM and agree with assessment. --voltaren gel  Dispo: Anticipated discharge in approximately 1 day(s).   Alphonzo Grieve, MD 06/09/2016, 9:54 AM Pager [phone]

## 2016-06-09 NOTE — Clinical Social Work Note (Signed)
CSW received consult, pt documented as homeless. CSW met with pt @ bedside. Pt is AOx4, pt reports that she has been living in her car and occasionally stays with son in [place]. Pt reports that she is on probation in [place] and unsure if she can live with son on a regular basis. CSW advised pt to consult with her probation officer.  CSW provided pt with a list of shelters in the area as well as facilities that offer meals free throughout the week.  Pt reports no income. Pt states that she hasn't followed through with applying for disability. Pt understands the process and knows what she must do to complete process.  Pt reports interests in getting connected with a PCP. CSW will refer to RNCM.  Pt reports when discharged her son will pick her up. Pt's car is parked at [employer], pt unclear if it is still there.  Pt has no other needs at this time. Resources provided to pt. CSW is signing off.   B. ,MSW, LCSWA Clinical Social Work Dept Weekend Social Worker [phone] 3:01 PM     

## 2016-06-09 NOTE — Consult Note (Signed)
CARDIOLOGY CONSULT NOTE       Patient ID: Kelli Jenkins MRN: [id number] DOB/AGE: July 21, 1960 56 y.o.  Admit date: 06/07/2016 Referring Physician: Eppie Gibson Primary Physician: No PCP Per Patient Primary Cardiologist:  New Reason for Consultation:  Chest Pain  Principal Problem:   Nausea & vomiting Active Problems:   Chest pain   HPI:  56 y.o. history of uterine cancer post hysterectomy substance abuse Days of nausea and vomiting This  Is associated with atypical chest pain. Positional and reproducible on palpation  Multiple meds for nausea tried In ER and patient admitted for care. This am very lethargic from meds not complaining of chest pain dyspnea Telemetry NSR  No sick contacts no diarrhea  Had low grade fever No previous history of vascular disease Or CAD. No pleuritic pain or leg pain. ECG no acute changes and enzymes negative.   Still smoking a ppd   ROS All other systems reviewed and negative except as noted above  Past Medical History:  Diagnosis Date  . Cancer ([place])    uterine  . Cocaine addiction ([place])   . Hypokalemia   . Nephrolithiasis   . Pyelonephritis   . SIRS (systemic inflammatory response syndrome) (H[facility])   . Tobacco abuse   . UTI (urinary tract infection) 06/2013    History reviewed. No pertinent family history.  Social History   Social History  . Marital status: Married    Spouse name: N/A  . Number of children: N/A  . Years of education: N/A   Occupational History  . Not on file.   Social History Main Topics  . Smoking status: Current Every Day Smoker    Packs/day: 1.00    Types: Cigarettes  . Smokeless tobacco: Never Used  . Alcohol use Yes     Comment: occasionally  . Drug use: No     Comment: HISTORY OF COCAINE USE  . Sexual activity: No   Other Topics Concern  . Not on file   Social History Narrative  . No narrative on file    Past Surgical History:  Procedure Laterality Date  . RENAL ARTERY STENT     stones  . TUBAL  LIGATION    . VAGINAL HYSTERECTOMY       . enoxaparin (LOVENOX) injection  40 mg Subcutaneous Q24H     Physical Exam: Blood pressure 121/66, pulse 76, temperature 98.1 F (36.7 C), temperature source Oral, resp. rate 18, height 5\' 8"  (1.727 m), weight 209 lb (94.8 kg), SpO2 97 %.    Lethargic  Healthy:  appears stated age 56: normal Neck supple with no adenopathy JVP normal no bruits no thyromegaly Lungs clear with no wheezing and good diaphragmatic motion Heart:  S1/S2 no murmur, no rub, gallop or click PMI normal Abdomen: benighn, BS positve, no tenderness, no AAA no bruit.  No HSM or HJR Distal pulses intact with no bruits No edema Neuro non-focal Skin warm and dry No muscular weakness   Labs:   Lab Results  Component Value Date   WBC 8.9 06/09/2016   HGB 13.2 06/09/2016   HCT 39.4 06/09/2016   MCV 92.3 06/09/2016   PLT 239 06/09/2016    Recent Labs Lab 06/02/16 1837  06/09/16 0526  NA 136  < > 138  K 4.1  < > 3.5  CL 101  < > 103  CO2 25  < > 25  BUN 17  < > 7  CREATININE 1.01*  < > 0.83  CALCIUM 9.7  < >  8.7*  PROT 8.2*  --   --   BILITOT 0.8  --   --   ALKPHOS 85  --   --   ALT 17  --   --   AST 17  --   --   GLUCOSE 110*  < > 99  < > = values in this interval not displayed. Lab Results  Component Value Date   TROPONINI <0.03 06/08/2016     Radiology: Dg Chest 2 View  Result Date: 06/07/2016 CLINICAL DATA:  Acute onset of central and left-sided chest tightness, radiating to the left arm and back. Nausea and vomiting. Initial encounter. EXAM: CHEST  2 VIEW COMPARISON:  Chest radiograph performed 06/02/2016 FINDINGS: The lungs are well-aerated. Mild peribronchial thickening is noted. There is no evidence of focal opacification, pleural effusion or pneumothorax. The heart is normal in size; the mediastinal contour is within normal limits. No acute osseous abnormalities are seen. IMPRESSION: Mild peribronchial thickening noted.  Lungs otherwise  grossly clear. Electronically Signed   By: Garald Balding M.D.   On: 06/07/2016 21:35   Dg Chest 2 View  Result Date: 06/02/2016 CLINICAL DATA:  Weakness and dizziness, fever cough EXAM: CHEST  2 VIEW COMPARISON:  05/03/2016 FINDINGS: The heart size and mediastinal contours are within normal limits. Both lungs are clear. The visualized skeletal structures are unremarkable. IMPRESSION: No active cardiopulmonary disease. Electronically Signed   By: Donavan Foil M.D.   On: 06/02/2016 18:09    EKG: ST rate 111 normal     ASSESSMENT AND PLAN:  Atypical chest Pain:  Non cardiac ECG and enzymes negative related to nausea and vomiting CXR ok No need for Further w/u   Nausea:  Etiology unclear Abdomen not distended lipase normal ? Viral gastroenteritis plan per primary service   Substance Abuse:  Urine positive for cocaine and BZ's    Signed: Jenkins [place] 06/09/2016, 8:18 AM

## 2016-06-09 NOTE — Discharge Instructions (Signed)
Chest Wall Pain °Chest wall pain is pain in or around the bones and muscles of your chest. Sometimes, an injury causes this pain. Sometimes, the cause may not be known. This pain may take several weeks or longer to get better. °Follow these instructions at home: °Pay attention to any changes in your symptoms. Take these actions to help with your pain: °· Rest as told by your health care provider. °· Avoid activities that cause pain. These include any activities that use your chest muscles or your abdominal and side muscles to lift heavy items. °· If directed, apply ice to the painful area: °¨ Put ice in a plastic bag. °¨ Place a towel between your skin and the bag. °¨ Leave the ice on for 20 minutes, 2-3 times per day. °· Take over-the-counter and prescription medicines only as told by your health care provider. °· Do not use tobacco products, including cigarettes, chewing tobacco, and e-cigarettes. If you need help quitting, ask your health care provider. °· Keep all follow-up visits as told by your health care provider. This is important. °Contact a health care provider if: °· You have a fever. °· Your chest pain becomes worse. °· You have new symptoms. °Get help right away if: °· You have nausea or vomiting. °· You feel sweaty or light-headed. °· You have a cough with phlegm (sputum) or you cough up blood. °· You develop shortness of breath. °This information is not intended to replace advice given to you by your health care provider. Make sure you discuss any questions you have with your health care provider. °Document Released: 04/09/2005 Document Revised: 08/18/2015 Document Reviewed: 07/05/2014 °Elsevier Interactive Patient Education © 2017 [employer] ° °

## 2016-06-09 NOTE — Progress Notes (Signed)
Upon coming in the room to do bedside report, found patient vomiting into trash can after eating dinner tray. Suggested that patient remain NPO for the rest of the night. Will continue to monitor.  Albertina Senegal E

## 2016-06-09 NOTE — Discharge Summary (Signed)
Name: Kelli Jenkins MRN: [id number] DOB: 06-Sep-1960 56 y.o. PCP: No Pcp Per Patient  Date of Admission: 06/07/2016 11:43 PM Date of Discharge: 06/09/2016 Attending Physician: Oval Linsey, MD  Discharge Diagnosis: 1. Nausea and vomiting Principal Problem:   Nausea & vomiting Active Problems:   Chest pain   Discharge Medications: Allergies as of 06/09/2016      Reactions   Darvocet [propoxyphene N-acetaminophen] Swelling   OK to take acetaminophen   Ibuprofen Swelling   Ok to take ketorolac   Tramadol Swelling      Medication List    STOP taking these medications   neomycin-polymyxin-hydrocortisone 3.5-10000-1 otic suspension Commonly known as:  CORTISPORIN     TAKE these medications   diclofenac sodium 1 % Gel Commonly known as:  VOLTAREN Apply 4 g topically 4 (four) times daily.   pantoprazole 40 MG tablet Commonly known as:  PROTONIX Take 1 tablet (40 mg total) by mouth daily. Start taking on:  06/10/2016   promethazine 12.5 MG tablet Commonly known as:  PHENERGAN Take 1 tablet (12.5 mg total) by mouth every 6 (six) hours as needed for nausea or vomiting.       Disposition and follow-up:   Ms.Kelli Jenkins was discharged from [facility] in Stable condition.  At the hospital follow up visit please address:  1.   N/V: --has patient been tolerating PO intake? --any further nausea and vomiting?  --given phenergan at d/c  Chest pain: --any change to chest pain? --is voltaren gel helping to relieve symptoms  2.  Labs / imaging needed at time of follow-up: none  3.  Pending labs/ test needing follow-up: none  Follow-up Appointments: Follow-up Information    [facility] Follow up.   Why:  Call this number and follow the prompts to get a primary care physician. Contact information: [place] [facility] Course by problem list: Principal Problem:   Nausea & vomiting Active Problems:   Chest pain   Nausea  and vomiting: Patient presented with 6 day history of nausea and vomiting, and inability to tolerate PO intake. She was seen in the ED 3 days prior to admission and given zofran which did not help. Her Cr was stable, lipase was negative, and she was slightly hemoconcentrated. Her A1c was checked to assess for possibility of gastroparesis, however A1c was 5.2 so this is unlikely. Her UDS was positive for benzos and opioids. Her flu panel was negative. On this admission, patient was given multiple agents to control her nausea. She was given IVF, and phenergan for control of symptoms. Upon admission, she had one episode of vomiting after advancing her diet too quickly; following this she was able to tolerate PO intake. Her symptoms are likely explained by a viral gastroenteritis. She was discharged with phenergan and protonix.   Musculoskeletal chest pain: Patient presented with days history of chest pain that is reproducible on palpation, bilaterally located, sharp in nature, and worsened by movement. Patient had relief with ketorolac and was transitioned to voltaren gel. Her Troponin was negative and EKG was unremarkable for ischemic changes. UDS did not reveal cocaine, and D dimer was negative. She was discharged with voltaren gel. Cardiology evaluated patient and agreed with our assessment.   Discharge Vitals:   BP 128/85 (BP Location: Right Arm)   Pulse 87   Temp 97.6 F (36.4 C) (Axillary)   Resp (!) 23   Ht 5\' 8"  (1.727 m)  Wt 209 lb (94.8 kg)   SpO2 98%   BMI 31.78 kg/m   Pertinent Labs, Studies, and Procedures:  CBC Latest Ref Rng & Units 06/09/2016 06/07/2016 06/02/2016  WBC 4.0 - 10.5 K/uL 8.9 14.4(H) 14.3(H)  Hemoglobin 12.0 - 15.0 g/dL 13.2 15.3(H) 14.9  Hematocrit 36.0 - 46.0 % 39.4 44.0 43.1  Platelets 150 - 400 K/uL 239 299 307   BMP Latest Ref Rng & Units 06/09/2016 06/07/2016 06/02/2016  Glucose 65 - 99 mg/dL 99 107(H) 110(H)  BUN 6 - 20 mg/dL 7 8 17   Creatinine 0.44 - 1.00  mg/dL 0.83 0.94 1.01(H)  Sodium 135 - 145 mmol/L 138 134(L) 136  Potassium 3.5 - 5.1 mmol/L 3.5 3.7 4.1  Chloride 101 - 111 mmol/L 103 98(L) 101  CO2 22 - 32 mmol/L 25 21(L) 25  Calcium 8.9 - 10.3 mg/dL 8.7(L) 9.6 9.7   D dimer 06/08/2016: 0.32 Troponin 06/08/2016: <0.03 HIV ab 06/08/2016: nonreactive HbA1c 06/08/2016: 5.2 Hep C ab 06/08/2016: <0.1 Influenza panel 06/09/2016: negative  Discharge Instructions: Discharge Instructions    Call MD for:  difficulty breathing, headache or visual disturbances    Complete by:  As directed    Call MD for:  extreme fatigue    Complete by:  As directed    Call MD for:  hives    Complete by:  As directed    Call MD for:  persistant dizziness or light-headedness    Complete by:  As directed    Call MD for:  persistant nausea and vomiting    Complete by:  As directed    Call MD for:  redness, tenderness, or signs of infection (pain, swelling, redness, odor or green/yellow discharge around incision site)    Complete by:  As directed    Call MD for:  severe uncontrolled pain    Complete by:  As directed    Call MD for:  temperature >100.4    Complete by:  As directed    Diet - low sodium heart healthy    Complete by:  As directed    Discharge instructions    Complete by:  As directed    You were evaluated at the hospital for nausea, vomiting and chest pain.  Your heart workup was reassuring. Your chest pain is coming from your rib cage and was likely made worse with vomiting.  To help with the pain, apply voltaren gel up to 4 times a day.   Your nausea and vomiting is likely due to a virus. You can take tylenol (up to 3000mg  total in a 24 hour period) to help with body aches. You can take phenergan every 6 hours if needed for nausea.  I have also prescribed an acid reducer (pantoprazole). Please take it every morning 30 min before breakfast to control your heartburn symptoms.   Please slowly increase your diet; stay with liquids and soup for  today, and then begin soft foods.   Increase activity slowly    Complete by:  As directed       Signed: Alphonzo Grieve, MD 06/09/2016, 4:34 PM   Pager [phone]

## 2016-09-20 ENCOUNTER — Emergency Department (HOSPITAL_COMMUNITY): Payer: Self-pay

## 2016-09-20 ENCOUNTER — Inpatient Hospital Stay (HOSPITAL_COMMUNITY)
Admission: EM | Admit: 2016-09-20 | Discharge: 2016-09-23 | DRG: 378 | Disposition: A | Discharge status: Home / Self Care | Payer: No Typology Code available for payment source | Attending: Internal Medicine | Admitting: Internal Medicine

## 2016-09-20 ENCOUNTER — Other Ambulatory Visit (HOSPITAL_COMMUNITY): Payer: Self-pay

## 2016-09-20 ENCOUNTER — Other Ambulatory Visit: Payer: Self-pay | Admitting: Emergency Medicine

## 2016-09-20 DIAGNOSIS — R52 Pain, unspecified: Secondary | ICD-10-CM

## 2016-09-20 DIAGNOSIS — A419 Sepsis, unspecified organism: Secondary | ICD-10-CM

## 2016-09-20 DIAGNOSIS — Z833 Family history of diabetes mellitus: Secondary | ICD-10-CM

## 2016-09-20 DIAGNOSIS — Z885 Allergy status to narcotic agent status: Secondary | ICD-10-CM

## 2016-09-20 DIAGNOSIS — D62 Acute posthemorrhagic anemia: Secondary | ICD-10-CM | POA: Diagnosis present

## 2016-09-20 DIAGNOSIS — R197 Diarrhea, unspecified: Secondary | ICD-10-CM

## 2016-09-20 DIAGNOSIS — Z720 Tobacco use: Secondary | ICD-10-CM

## 2016-09-20 DIAGNOSIS — I959 Hypotension, unspecified: Secondary | ICD-10-CM

## 2016-09-20 DIAGNOSIS — Z836 Family history of other diseases of the respiratory system: Secondary | ICD-10-CM

## 2016-09-20 DIAGNOSIS — F329 Major depressive disorder, single episode, unspecified: Secondary | ICD-10-CM | POA: Diagnosis present

## 2016-09-20 DIAGNOSIS — M25511 Pain in right shoulder: Secondary | ICD-10-CM | POA: Diagnosis present

## 2016-09-20 DIAGNOSIS — R079 Chest pain, unspecified: Secondary | ICD-10-CM | POA: Diagnosis present

## 2016-09-20 DIAGNOSIS — Z886 Allergy status to analgesic agent status: Secondary | ICD-10-CM

## 2016-09-20 DIAGNOSIS — F17210 Nicotine dependence, cigarettes, uncomplicated: Secondary | ICD-10-CM | POA: Diagnosis present

## 2016-09-20 DIAGNOSIS — K2970 Gastritis, unspecified, without bleeding: Secondary | ICD-10-CM | POA: Diagnosis present

## 2016-09-20 DIAGNOSIS — W19XXXA Unspecified fall, initial encounter: Secondary | ICD-10-CM | POA: Diagnosis present

## 2016-09-20 DIAGNOSIS — T39395A Adverse effect of other nonsteroidal anti-inflammatory drugs [NSAID], initial encounter: Secondary | ICD-10-CM | POA: Diagnosis present

## 2016-09-20 DIAGNOSIS — E860 Dehydration: Secondary | ICD-10-CM | POA: Diagnosis present

## 2016-09-20 DIAGNOSIS — K270 Acute peptic ulcer, site unspecified, with hemorrhage: Principal | ICD-10-CM | POA: Diagnosis present

## 2016-09-20 DIAGNOSIS — Z9119 Patient's noncompliance with other medical treatment and regimen: Secondary | ICD-10-CM

## 2016-09-20 DIAGNOSIS — Z8249 Family history of ischemic heart disease and other diseases of the circulatory system: Secondary | ICD-10-CM

## 2016-09-20 DIAGNOSIS — K921 Melena: Secondary | ICD-10-CM

## 2016-09-20 DIAGNOSIS — F1410 Cocaine abuse, uncomplicated: Secondary | ICD-10-CM | POA: Diagnosis present

## 2016-09-20 DIAGNOSIS — R112 Nausea with vomiting, unspecified: Secondary | ICD-10-CM

## 2016-09-20 LAB — POC OCCULT BLOOD, ED: Fecal Occult Bld: POSITIVE — AB

## 2016-09-20 LAB — URINALYSIS, ROUTINE W REFLEX MICROSCOPIC
BACTERIA UA: NONE SEEN
BILIRUBIN URINE: NEGATIVE
GLUCOSE, UA: NEGATIVE mg/dL
Hgb urine dipstick: NEGATIVE
Ketones, ur: NEGATIVE mg/dL
NITRITE: NEGATIVE
Protein, ur: NEGATIVE mg/dL
SPECIFIC GRAVITY, URINE: 1.026 (ref 1.005–1.030)
pH: 6 (ref 5.0–8.0)

## 2016-09-20 LAB — TROPONIN I
Troponin I: <0.03 ng/mL (ref <0.03)
Troponin I: <0.03 ng/mL (ref <0.03)

## 2016-09-20 LAB — CBC WITH DIFFERENTIAL/PLATELET
BASOS PCT: 0 %
Basophils Absolute: 0 10*3/uL (ref 0.0–0.1)
Eosinophils Absolute: 0.1 10*3/uL (ref 0.0–0.7)
Eosinophils Relative: 1 %
HEMATOCRIT: 31.9 % — AB (ref 36.0–46.0)
HEMOGLOBIN: 10.8 g/dL — AB (ref 12.0–15.0)
Lymphocytes Relative: 20 %
Lymphs Abs: 3.2 10*3/uL (ref 0.7–4.0)
MCH: 32 pg (ref 26.0–34.0)
MCHC: 33.9 g/dL (ref 30.0–36.0)
MCV: 94.7 fL (ref 78.0–100.0)
Monocytes Absolute: 0.7 10*3/uL (ref 0.1–1.0)
Monocytes Relative: 4 %
NEUTROS ABS: 12.1 10*3/uL — AB (ref 1.7–7.7)
NEUTROS PCT: 75 %
Platelets: 306 10*3/uL (ref 150–400)
RBC: 3.37 MIL/uL — ABNORMAL LOW (ref 3.87–5.11)
RDW: 13.8 % (ref 11.5–15.5)
WBC: 16.1 10*3/uL — ABNORMAL HIGH (ref 4.0–10.5)

## 2016-09-20 LAB — I-STAT CHEM 8, ED
BUN: 39 mg/dL — AB (ref 6–20)
CHLORIDE: 108 mmol/L (ref 101–111)
Calcium, Ion: 1.15 mmol/L (ref 1.15–1.40)
Creatinine, Ser: 1 mg/dL (ref 0.44–1.00)
GLUCOSE: 132 mg/dL — AB (ref 65–99)
HEMATOCRIT: 44 % (ref 36.0–46.0)
Hemoglobin: 15 g/dL (ref 12.0–15.0)
POTASSIUM: 3.4 mmol/L — AB (ref 3.5–5.1)
Sodium: 140 mmol/L (ref 135–145)
TCO2: 22 mmol/L (ref 0–100)

## 2016-09-20 LAB — I-STAT CG4 LACTIC ACID, ED
LACTIC ACID, VENOUS: 1.56 mmol/L (ref 0.5–1.9)
LACTIC ACID, VENOUS: 0.84 mmol/L (ref 0.5–1.9)

## 2016-09-20 LAB — COMPREHENSIVE METABOLIC PANEL
ALT: 9 U/L — ABNORMAL LOW (ref 14–54)
ANION GAP: 8 (ref 5–15)
AST: 14 U/L — ABNORMAL LOW (ref 15–41)
Albumin: 3.3 g/dL — ABNORMAL LOW (ref 3.5–5.0)
Alkaline Phosphatase: 59 U/L (ref 38–126)
BILIRUBIN TOTAL: 0.4 mg/dL (ref 0.3–1.2)
BUN: 40 mg/dL — ABNORMAL HIGH (ref 6–20)
CO2: 23 mmol/L (ref 22–32)
Calcium: 8.5 mg/dL — ABNORMAL LOW (ref 8.9–10.3)
Chloride: 108 mmol/L (ref 101–111)
Creatinine, Ser: 1.04 mg/dL — ABNORMAL HIGH (ref 0.44–1.00)
GFR, EST NON AFRICAN AMERICAN: 58 mL/min — AB (ref >60)
Glucose, Bld: 143 mg/dL — ABNORMAL HIGH (ref 65–99)
POTASSIUM: 3.5 mmol/L (ref 3.5–5.1)
Sodium: 139 mmol/L (ref 135–145)
TOTAL PROTEIN: 6.2 g/dL — AB (ref 6.5–8.1)

## 2016-09-20 LAB — RAPID URINE DRUG SCREEN, HOSP PERFORMED
AMPHETAMINES: NOT DETECTED
Barbiturates: NOT DETECTED
Benzodiazepines: NOT DETECTED
COCAINE: POSITIVE — AB
OPIATES: NOT DETECTED
Tetrahydrocannabinol: NOT DETECTED

## 2016-09-20 LAB — I-STAT TROPONIN, ED: Troponin i, poc: 0 ng/mL (ref 0.00–0.08)

## 2016-09-20 LAB — LIPASE, BLOOD: LIPASE: 26 U/L (ref 11–51)

## 2016-09-20 LAB — ABO/RH: ABO/RH(D): AB POS

## 2016-09-20 LAB — CK: Total CK: 21 U/L — ABNORMAL LOW (ref 38–234)

## 2016-09-20 MED ORDER — SODIUM CHLORIDE 0.9 % IV SOLN
INTRAVENOUS | Status: DC
  Administered 2016-09-20 – 2016-09-23 (×4): via INTRAVENOUS

## 2016-09-20 MED ORDER — ENOXAPARIN SODIUM 40 MG/0.4ML ~~LOC~~ SOLN
40.0000 mg | SUBCUTANEOUS | Status: DC
  Administered 2016-09-20: 40 mg via SUBCUTANEOUS
  Filled 2016-09-20: qty 0.4

## 2016-09-20 MED ORDER — VANCOMYCIN HCL 10 G IV SOLR
1250.0000 mg | Freq: Two times a day (BID) | INTRAVENOUS | Status: DC
  Administered 2016-09-20 – 2016-09-21 (×2): 1250 mg via INTRAVENOUS
  Filled 2016-09-20 (×2): qty 1250

## 2016-09-20 MED ORDER — SODIUM CHLORIDE 0.9 % IV BOLUS (SEPSIS)
1000.0000 mL | Freq: Once | INTRAVENOUS | Status: AC
  Administered 2016-09-20: 1000 mL via INTRAVENOUS

## 2016-09-20 MED ORDER — POTASSIUM CHLORIDE IN NACL 20-0.9 MEQ/L-% IV SOLN
INTRAVENOUS | Status: DC
  Administered 2016-09-20: 16:00:00 via INTRAVENOUS
  Filled 2016-09-20 (×4): qty 1000

## 2016-09-20 MED ORDER — ACETAMINOPHEN 650 MG RE SUPP
650.0000 mg | Freq: Four times a day (QID) | RECTAL | Status: DC | PRN
  Administered 2016-09-22: 650 mg via RECTAL
  Filled 2016-09-20: qty 1

## 2016-09-20 MED ORDER — ACETAMINOPHEN 325 MG PO TABS
650.0000 mg | ORAL_TABLET | Freq: Four times a day (QID) | ORAL | Status: DC | PRN
  Administered 2016-09-20 – 2016-09-23 (×6): 650 mg via ORAL
  Filled 2016-09-20 (×6): qty 2

## 2016-09-20 MED ORDER — SODIUM CHLORIDE 0.9 % IV BOLUS (SEPSIS)
1000.0000 mL | Freq: Once | INTRAVENOUS | Status: AC
  Administered 2016-09-20: 2000 mL via INTRAVENOUS

## 2016-09-20 MED ORDER — ONDANSETRON HCL 4 MG/2ML IJ SOLN
4.0000 mg | Freq: Four times a day (QID) | INTRAMUSCULAR | Status: DC | PRN
  Administered 2016-09-20 – 2016-09-22 (×4): 4 mg via INTRAVENOUS
  Filled 2016-09-20 (×3): qty 2

## 2016-09-20 MED ORDER — ONDANSETRON HCL 4 MG/2ML IJ SOLN
4.0000 mg | Freq: Once | INTRAMUSCULAR | Status: AC
  Administered 2016-09-20: 4 mg via INTRAVENOUS
  Filled 2016-09-20: qty 2

## 2016-09-20 MED ORDER — PIPERACILLIN-TAZOBACTAM 3.375 G IVPB 30 MIN
3.3750 g | Freq: Once | INTRAVENOUS | Status: AC
  Administered 2016-09-20: 3.375 g via INTRAVENOUS
  Filled 2016-09-20: qty 50

## 2016-09-20 MED ORDER — NICOTINE 14 MG/24HR TD PT24
14.0000 mg | MEDICATED_PATCH | Freq: Every day | TRANSDERMAL | Status: DC
  Filled 2016-09-20: qty 1

## 2016-09-20 MED ORDER — CALCIUM CARBONATE ANTACID 500 MG PO CHEW: 2.0000 | CHEWABLE_TABLET | ORAL | Status: DC | PRN

## 2016-09-20 MED ORDER — PIPERACILLIN-TAZOBACTAM 3.375 G IVPB
3.3750 g | Freq: Three times a day (TID) | INTRAVENOUS | Status: DC
  Administered 2016-09-20 – 2016-09-21 (×2): 3.375 g via INTRAVENOUS
  Filled 2016-09-20 (×4): qty 50

## 2016-09-20 MED ORDER — IOPAMIDOL (ISOVUE-300) INJECTION 61%
INTRAVENOUS | Status: AC
  Filled 2016-09-20: qty 100

## 2016-09-20 MED ORDER — ONDANSETRON HCL 4 MG PO TABS: 4.0000 mg | ORAL_TABLET | Freq: Four times a day (QID) | ORAL | Status: DC | PRN

## 2016-09-20 MED ORDER — FAMOTIDINE 20 MG PO TABS
20.0000 mg | ORAL_TABLET | Freq: Every day | ORAL | Status: DC
  Administered 2016-09-20 – 2016-09-23 (×4): 20 mg via ORAL
  Filled 2016-09-20 (×4): qty 1

## 2016-09-20 MED ORDER — VANCOMYCIN HCL IN DEXTROSE 1-5 GM/200ML-% IV SOLN
1000.0000 mg | Freq: Once | INTRAVENOUS | Status: AC
  Administered 2016-09-20: 1000 mg via INTRAVENOUS
  Filled 2016-09-20: qty 200

## 2016-09-20 MED ORDER — IOPAMIDOL (ISOVUE-300) INJECTION 61%
100.0000 mL | Freq: Once | INTRAVENOUS | Status: AC | PRN
  Administered 2016-09-20: 100 mL via INTRAVENOUS

## 2016-09-20 MED ORDER — ACETAMINOPHEN 500 MG PO TABS
1000.0000 mg | ORAL_TABLET | Freq: Once | ORAL | Status: AC
  Administered 2016-09-20: 1000 mg via ORAL
  Filled 2016-09-20: qty 2

## 2016-09-20 MED ORDER — FENTANYL CITRATE (PF) 100 MCG/2ML IJ SOLN
50.0000 ug | Freq: Once | INTRAMUSCULAR | Status: AC
  Administered 2016-09-20: 50 ug via INTRAVENOUS
  Filled 2016-09-20: qty 2

## 2016-09-20 MED ORDER — SODIUM CHLORIDE 0.9% FLUSH
3.0000 mL | Freq: Two times a day (BID) | INTRAVENOUS | Status: DC
  Administered 2016-09-22 – 2016-09-23 (×2): 3 mL via INTRAVENOUS

## 2016-09-20 NOTE — H&P (Addendum)
Please cosign corrected note in order to complete chart correction case 

## 2016-09-20 NOTE — H&P (Incomplete Revision)
History and Physical    Kelli Jenkins [id number] DOB: 10-Aug-1960 DOA: 09/20/2016  Referring MD/NP/PA: Lajean Saver PCP: Patient, No Pcp Per  Patient coming from: home  Chief Complaint: nausea, vomiting, diarrhea, and lightheadedness  HPI: Kelli Jenkins is a 56 y.o. female with history of tobacco abuse, cocaine abuse, depression, and medical noncompliance who presents with nausea, vomiting, and diarrhea.  She states that one week ago she developed nausea with vomiting, approximately 3-4 times per day, and watery diarrhea about 3-4 times per day. Some of her emesis has been bilious in appearance. She denies blood or coffee grounds. Her diarrhea over the last 2-3 days has been very black. She consumed an entire bottle of Pepto-Bismol in the last week which did not help her symptoms.  Fevers have been has high as 102F.  She was drinking water and trying to drink soup but had a hard time keeping fluids down. She has had 7 out of 10 cramping abdominal pain worse in the bilateral lower quadrants that comes and goes. Nothing has made it better or worse.  She came to the emergency department today because she felt very lightheaded and dehydrated.  She reports being treated with antibiotics for pneumonia twice in the last 4 months.  Patient incidentally reports that she has been having substernal chest tightness 9 out of 10 with radiation to her bilateral neck and bilateral shoulders. This is happened several times in the last few days. Symptoms last for minutes to hours. They are associated with dyspnea and diaphoresis but not nausea or vomiting.  ED Course:  T 100F, HR in low 100s, BP as low as 74 systolic.   WBC 16, lactic acid 1.56.  UA notable for 6-30WBC but contaminated with squamous cells and no bacteria present.  CT ab/pelvis was unremarkable.  Treated for sepsis in ER with IVF and vancomycin and zosyn.    Review of Systems:  General:  Positive fevers, chills, denies weight loss or gain HEENT:   Denies changes to hearing and vision.  Positive rhinorrhea, sinus congestion, sore throat CV:  Positive chest pain and denies  palpitations, lower extremity edema.  PULM:  SOB and increased cough.   GI:  Per HPI GU:  Denies dysuria, frequency, urgency ENDO:  Denies polyuria, polydipsia.   HEME:  Denies hematemesis, blood in stools, abnormal bruising or bleeding.  LYMPH:  Denies lymphadenopathy.   MSK:  Denies arthralgias, myalgias.   DERM:  Denies skin rash or ulcer.   NEURO:  Denies focal numbness, weakness, slurred speech, confusion, facial droop.  PSYCH:  Endorses anxiety and depression.    Past Medical History:  Diagnosis Date  . Cervical cancer ([place])   . Cocaine abuse   . Depression   . Noncompliance   . Pneumonia   . Tobacco abuse     Past Surgical History:  Procedure Laterality Date  . CERVICAL CONIZATION W/BX       reports that she has been smoking Cigarettes.  She has a 30.00 pack-year smoking history. She has never used smokeless tobacco. She reports that she uses drugs. She reports that she does not drink alcohol.  Allergies  Allergen Reactions  . Ibuprofen Swelling  . Tramadol Swelling    Family History  Problem Relation Age of Onset  . Diabetes Mellitus II Mother   . COPD Mother   . Diabetes Brother   . High blood pressure Brother   . Lung cancer Maternal Grandfather   . Diabetes Brother   .  Diabetes Brother     Prior to Admission medications   Not on File    Physical Exam: Vitals:   09/20/16 0930 09/20/16 1019 09/20/16 1030 09/20/16 1222  BP: 104/65 91/66 111/68 (!) 84/60  Pulse: 79 89  76  Resp: 18 18 15 20   Temp:  98.9 F (37.2 C)    TempSrc:  Oral    SpO2: 95% 96%  98%  Weight:      Height:       Constitutional: NAD, calm, comfortable.  Eyes closed and lying on side on stretcher.  Lips are very dry and pale Eyes: PERRL, lids and conjunctivae normal ENMT: Mucous membranes are moist. Posterior pharynx with erythema and cobblestoning.     Neck: normal, supple, no masses, no thyromegaly Respiratory: clear to auscultation bilaterally, no wheezing, no crackles. Normal respiratory effort. No accessory muscle use.  Cardiovascular: Regular rate and rhythm, no murmurs / rubs / gallops. No extremity edema. 2+ pedal pulses. No carotid bruits.  Abdomen:  Hyperactive BS, soft, nondistended, mild TTP in the LLQ without rebound or guarding.   Musculoskeletal: no clubbing / cyanosis. No joint deformity upper and lower extremities. Good ROM, no contractures. Normal muscle tone.  Skin: no rashes, lesions, ulcers. No induration Neurologic: CN 2-12 grossly intact. Sensation intact, DTR normal. Strength 5/5 in all 4.  Psychiatric:  Alert and oriented x 3.  Flat affect  Labs on Admission: I have personally reviewed following labs and imaging studies  CBC:  Recent Labs Lab 09/20/16 0630 09/20/16 0726  WBC 16.1*  --   NEUTROABS 12.1*  --   HGB 10.8* 15.0  HCT 31.9* 44.0  MCV 94.7  --   PLT 306  --    Basic Metabolic Panel:  Recent Labs Lab 09/20/16 0630 09/20/16 0726  NA 139 140  K 3.5 3.4*  CL 108 108  CO2 23  --   GLUCOSE 143* 132*  BUN 40* 39*  CREATININE 1.04* 1.00  CALCIUM 8.5*  --    GFR: Estimated Creatinine Clearance: 73.5 mL/min (by C-G formula based on SCr of 1 mg/dL). Liver Function Tests:  Recent Labs Lab 09/20/16 0630  AST 14*  ALT 9*  ALKPHOS 59  BILITOT 0.4  PROT 6.2*  ALBUMIN 3.3*    Recent Labs Lab 09/20/16 0630  LIPASE 26   No results for input(s): AMMONIA in the last 168 hours. Coagulation Profile: No results for input(s): INR, PROTIME in the last 168 hours. Cardiac Enzymes:  Recent Labs Lab 09/20/16 0630  CKTOTAL 21*   BNP (last 3 results) No results for input(s): PROBNP in the last 8760 hours. HbA1C: No results for input(s): HGBA1C in the last 72 hours. CBG: No results for input(s): GLUCAP in the last 168 hours. Lipid Profile: No results for input(s): CHOL, HDL, LDLCALC,  TRIG, CHOLHDL, LDLDIRECT in the last 72 hours. Thyroid Function Tests: No results for input(s): TSH, T4TOTAL, FREET4, T3FREE, THYROIDAB in the last 72 hours. Anemia Panel: No results for input(s): VITAMINB12, FOLATE, FERRITIN, TIBC, IRON, RETICCTPCT in the last 72 hours. Urine analysis:    Component Value Date/Time   COLORURINE YELLOW 09/20/2016 1120   APPEARANCEUR HAZY (A) 09/20/2016 1120   LABSPEC 1.026 09/20/2016 1120   PHURINE 6.0 09/20/2016 1120   GLUCOSEU NEGATIVE 09/20/2016 1120   HGBUR NEGATIVE 09/20/2016 [place] 09/20/2016 [place] 09/20/2016 1120   PROTEINUR NEGATIVE 09/20/2016 1120   NITRITE NEGATIVE 09/20/2016 1120   LEUKOCYTESUR TRACE (A) 09/20/2016  1120   Sepsis Labs: @LABRCNTIP (procalcitonin:4,lacticidven:4) )No results found for this or any previous visit (from the past 240 hour(s)).   Radiological Exams on Admission: Ct Abdomen Pelvis W Contrast  Result Date: 09/20/2016 CLINICAL DATA:  Lower abdominal pain and pelvic pain. Nausea and vomiting for several days. EXAM: CT ABDOMEN AND PELVIS WITH CONTRAST TECHNIQUE: Multidetector CT imaging of the abdomen and pelvis was performed using the standard protocol following bolus administration of intravenous contrast. CONTRAST:  ISOVUE-300 IOPAMIDOL (ISOVUE-300) INJECTION 61% COMPARISON:  None FINDINGS: Lower chest: Lung bases are clear. Hepatobiliary: No focal hepatic lesion. Normal gallbladder. No biliary duct dilatation. Pancreas: Pancreas is normal. No ductal dilatation. No pancreatic inflammation. Spleen: Normal spleen Adrenals/urinary tract: Adrenal glands and kidneys are normal. The ureters and bladder normal. Stomach/Bowel: Stomach, small bowel, appendix, and cecum are normal. The colon and rectosigmoid colon are normal. Vascular/Lymphatic: Abdominal aorta is normal caliber with atherosclerotic calcification. There is no retroperitoneal or periportal lymphadenopathy. No pelvic  lymphadenopathy. Reproductive: Post hysterectomy Other: No free fluid. Musculoskeletal: No aggressive osseous lesion. IMPRESSION: 1. No acute abdominopelvic findings. 2. Normal appendix. 3. Post hysterectomy. 4.  Atherosclerotic calcification of the aorta. Electronically Signed   By: Suzy Bouchard M.D.   On: 09/20/2016 11:35   Dg Chest Port 1 View  Result Date: 09/20/2016 CLINICAL DATA:  Cough. EXAM: PORTABLE CHEST 1 VIEW COMPARISON:  Thoracolumbar spine series 09/15/2009. FINDINGS: Mediastinum hilar structures are normal. Lungs are clear. Heart size normal. No pleural effusion or pneumothorax. No acute bony abnormality . IMPRESSION: No acute cardiopulmonary disease. Electronically Signed   By: Marcello Moores  Register   On: 09/20/2016 06:58    EKG: Independently reviewed.NSR, no ST segment elevations or depressions.  QTc 490 mS  Assessment/Plan Principal Problem:   Sepsis ([place]) Active Problems:   Noncompliance   Tobacco abuse   Nausea and vomiting   Diarrhea   Sepsis (tachycardia, hypotension, leukocytosis) due to probable viral gastroenteritis, persistently hypotensive in ER despite 3L IVF  -  Continue vancomycin and zosyn pending culture data -  F/u blood cultures -  F/u urine culture -  GI pathogen panel -  Enteric precautions -  Continue IVF -  Full liquids diet  Chest pains, atypical, and likely due to esophagitis from vomiting, however, patient's age and smoking history and recent stress from dehydration could be unmasking some CAD.  No ischemic changes on ECG and initial troponin negative. -  Troponins -  Telemetry -  ECHO -  Famotidine   Elevated BUN:Cr ratio suggests dehydration -  Continue IVF -  Repeat BMP in AM  Hyperglycemia, likely stress induced -.  A1c -  Hold off on SSI unless persistently > 140  Tobacco abuse -  Smoking cessation  -  Nicotine patch ordered  Hx of cocaine abuse -  UDS positive for cocaine  Mild hypokalemia -  Add potassium to IVF  DVT  prophylaxis: lovenox  Code Status: full  Family Communication: patient alone, no family at bedside  Disposition Plan: stepdown due to hypotension  Consults called: none  Admission status:  Inpatient due to patient's persistent hypotension despite IVF suggesting severe infection and inability to tolerate PO.  Concerned about C. Diff given exposure to recent antibiotics which could explain prolonged and severe symptoms.    Janece Canterbury MD [facility] Pager [phone]  If 7PM-7AM, please contact night-coverage [email or web address] Password [email or web address]  09/20/2016, 1:11 PM

## 2016-09-20 NOTE — Progress Notes (Incomplete Revision)
Pharmacy Antibiotic Note  Kelli Jenkins is a 56 y.o. female admitted on 09/20/2016 with sepsis.  Pharmacy has been consulted for Vancomycin and Zosyn dosing.   Plan: Vancomycin 1g IV x 1 given in the ED. Continue with Vancomycin 1250mg  IV q12h. Plan for Vancomycin trough level at steady state. Goal trough level 15-20 mcg/mL. Zosyn 3.375g IV x 1 over 30 min, then Zosyn 3.375g IV q8h (infuse over 4 hours). Monitor renal function, cultures, clinical course.   Height: 5\' 8"  (172.7 cm) Weight: 202 lb (91.6 kg) IBW/kg (Calculated) : 63.9  Temp (24hrs), Avg:98.7 F (37.1 C), Min:98.5 F (36.9 C), Max:98.9 F (37.2 C)   Recent Labs Lab 09/20/16 0630 09/20/16 0726 09/20/16 0727 09/20/16 1024  WBC 16.1*  --   --   --   CREATININE 1.04* 1.00  --   --   LATICACIDVEN  --   --  1.56 0.84    Estimated Creatinine Clearance: 73.5 mL/min (by C-G formula based on SCr of 1 mg/dL).    No Known Allergies  Antimicrobials this admission: 5/31 >> Zosyn >> 5/31 >> Vancomycin >>  Dose adjustments this admission: --  Microbiology results: 5/31 BCx: sent 5/31 UCx: ordered   Thank you for allowing pharmacy to be a part of this patient's care.   Lindell Spar, PharmD, BCPS Pager: [phone] 09/20/2016 11:11 AM

## 2016-09-20 NOTE — ED Notes (Signed)
Bed: WA20 Expected date:  Expected time:  Means of arrival:  Comments: 56 yr old, n,v abd pain

## 2016-09-20 NOTE — ED Triage Notes (Signed)
Pt comes to ed to via ems, c/o NVD x 1 week.  Pt c/o of right and left lower abdominal pain 7 out 10. Pt is alert x4, possible UTI . V/s hr 98, 97 room air, BP 121/101, rr16. Iv 16 in Left forearm. 100 cc in ems.  Denies any hx of allergies or other medications.  Address [address].

## 2016-09-20 NOTE — ED Provider Notes (Signed)
Medical screening examination/treatment/procedure(s) were conducted as a shared visit with non-physician practitioner(s) and myself.  I personally evaluated the patient during the encounter.   EKG Interpretation None      Pt is a 56 y.o. female with no significant past medical history who presents emergency department with a week of nausea, vomiting or diarrhea. Rectal temperature here of of 100. Mildly tachycardic and patient is hypotensive. Diffusely tender throughout the abdomen but has a nonsurgical abdomen on exam.  Workup reveals leukocytosis with left shift but normal lactate. No urinary tract infection. No pneumonia. CT of abdomen and pelvis unremarkable. Reports having black stools after drinking a bottle of Pepto-Bismol. No melena or hematochezia on exam. Minimally guaiac positive. Hemoglobin is 10. Blood pressures improving with IV hydration. Patient received broad-spectrum antibiotics for possible sepsis. Patient admitted to medicine service. Possible hypovolemia secondary to nausea, vomiting, diarrhea over the past 7 days.   Deigo Alonso, Delice Bison, DO 09/20/16 2330

## 2016-09-20 NOTE — ED Provider Notes (Addendum)
[facility] DEPT Provider Note   CSN: [id number] Arrival date & time: 09/20/16  7672  Patient has a duplicate chart, MRN for patients primary chart is [id number] under Leonette Most   History   Chief Complaint Chief Complaint  Patient presents with  . Nausea  . Emesis  . Diarrhea  . Abdominal Pain    right and left lower ( 7 out 10)       HPI Kelli Jenkins is a 56 y.o. female who presents via ems for N/V/D, abdominal pain, and dizziness for one week.  Patient states she has felt febrile at home, but has not taken her temperature.  She reports she has been gradually getting worse, feels like she can't sit up or she feels like she is going to pass out.  She reports that "every thing hurts" including all her muscles, and her abdomen, chest, and head.  Denies trauma, drug use.  No known sick contacts.  Pain and burning with urination, no frequency, urgency.  States she has had black tarry BMs but that started after she had a "bottle" of pepto bisomol.  She endorses nonproductive cough.  Patient denies Medical conditions, taking medications, or allergies to medications.  She denies drug or alcohol use.   HPI  Past Medical History:  Diagnosis Date  . Cancer ([place])    uterine  . Cocaine addiction ([place])   . Hypokalemia   . Nephrolithiasis   . Pyelonephritis   . SIRS (systemic inflammatory response syndrome) ([facility])   . Tobacco abuse   . UTI (urinary tract infection) 06/2013    There are no active problems to display for this patient.   Past Surgical History:  Procedure Laterality Date  . ESOPHAGOGASTRODUODENOSCOPY N/A 09/22/2016   Procedure: ESOPHAGOGASTRODUODENOSCOPY (EGD);  Surgeon: Ronald Lobo, MD;  Location: [facility];  Service: Endoscopy;  Laterality: N/A;  . RENAL ARTERY STENT     stones  . TUBAL LIGATION    . VAGINAL HYSTERECTOMY      OB History    No data available       Home Medications    Prior to Admission medications   Not on File    Family  History No family history on file.  Social History Social History   Tobacco Use  . Smoking status: Current Every Day Smoker    Packs/day: 1.00    Types: Cigarettes  . Smokeless tobacco: Never Used  Substance Use Topics  . Alcohol use: Yes    Comment: occasionally  . Drug use: No    Comment: HISTORY OF COCAINE USE     Allergies   Darvocet [propoxyphene n-acetaminophen]; Ibuprofen; and Tramadol   Review of Systems Review of Systems  Constitutional: Positive for appetite change, chills, diaphoresis, fatigue and fever.  HENT: Negative for congestion, rhinorrhea and sneezing.   Eyes: Negative for pain and visual disturbance.  Respiratory: Positive for cough. Negative for apnea, choking and shortness of breath.   Cardiovascular: Negative for palpitations and leg swelling.  Gastrointestinal: Positive for abdominal pain, diarrhea, nausea and vomiting. Negative for constipation.  Genitourinary: Positive for dysuria. Negative for difficulty urinating, frequency, hematuria, menstrual problem, urgency, vaginal bleeding, vaginal discharge and vaginal pain.  Musculoskeletal: Positive for arthralgias, back pain and myalgias ("every thing hurts").  Skin: Positive for pallor. Negative for rash and wound.  Neurological: Positive for light-headedness. Negative for tremors, syncope, numbness and headaches.     Physical Exam Updated Vital Signs BP (!) 84/60 (BP Location: Left Arm)   Pulse  76   Temp 98.9 F (37.2 C) (Oral)   Resp 20   Ht 5\' 8"  (1.727 m)   Wt 91.6 kg (202 lb)   SpO2 98%   BMI 30.71 kg/m   Rectal temp of 100.0  Physical Exam  Constitutional: She is oriented to person, place, and time. She appears well-developed and well-nourished.  HENT:  Head: Normocephalic and atraumatic.  Right Ear: External ear normal.  Left Ear: External ear normal.  Nose: Nose normal.  Mouth/Throat: Oropharynx is clear and moist. No oropharyngeal exudate.  Eyes: Pupils are equal, round, and  reactive to light. No scleral icterus.  Neck: Neck supple. No JVD present.  Cardiovascular: Regular rhythm, normal heart sounds and intact distal pulses.   No murmur heard. Pulmonary/Chest: Effort normal and breath sounds normal. No stridor. No respiratory distress.  Abdominal: Soft. Normal appearance and bowel sounds are normal. She exhibits no distension and no mass. There is no hepatosplenomegaly. There is tenderness in the right upper quadrant, right lower quadrant, suprapubic area and left lower quadrant. There is no rigidity, no guarding, no CVA tenderness, no tenderness at McBurney's point and negative Murphy's sign.  Musculoskeletal: She exhibits tenderness (diffusely to all muscles).  Lymphadenopathy:    She has no cervical adenopathy.  Neurological: She is alert and oriented to person, place, and time. No cranial nerve deficit or sensory deficit. She exhibits normal muscle tone.  Skin: Skin is warm. There is pallor.  Psychiatric: She has a normal mood and affect. Her behavior is normal.  Nursing note and vitals reviewed.    ED Treatments / Results  Labs (all labs ordered are listed, but only abnormal results are displayed) Labs Reviewed  COMPREHENSIVE METABOLIC PANEL - Abnormal; Notable for the following:       Result Value   Glucose, Bld 143 (*)    BUN 40 (*)    Creatinine, Ser 1.04 (*)    Calcium 8.5 (*)    Total Protein 6.2 (*)    Albumin 3.3 (*)    AST 14 (*)    ALT 9 (*)    GFR calc non Af Amer 58 (*)    All other components within normal limits  CBC WITH DIFFERENTIAL/PLATELET - Abnormal; Notable for the following:    WBC 16.1 (*)    RBC 3.37 (*)    Hemoglobin 10.8 (*)    HCT 31.9 (*)    Neutro Abs 12.1 (*)    All other components within normal limits  URINALYSIS, ROUTINE W REFLEX MICROSCOPIC - Abnormal; Notable for the following:    APPearance HAZY (*)    Leukocytes, UA TRACE (*)    Squamous Epithelial / LPF 6-30 (*)    All other components within normal  limits  CK - Abnormal; Notable for the following:    Total CK 21 (*)    All other components within normal limits  RAPID URINE DRUG SCREEN, HOSP PERFORMED - Abnormal; Notable for the following:    Cocaine POSITIVE (*)    All other components within normal limits  I-STAT CHEM 8, ED - Abnormal; Notable for the following:    Potassium 3.4 (*)    BUN 39 (*)    Glucose, Bld 132 (*)    All other components within normal limits  POC OCCULT BLOOD, ED - Abnormal; Notable for the following:    Fecal Occult Bld POSITIVE (*)    All other components within normal limits  CULTURE, BLOOD (ROUTINE X 2)  CULTURE, BLOOD (ROUTINE  X 2)  URINE CULTURE  LIPASE, BLOOD  I-STAT CG4 LACTIC ACID, ED  I-STAT TROPOININ, ED  I-STAT CG4 LACTIC ACID, ED  TYPE AND SCREEN  ABO/RH    EKG  EKG Interpretation None       Radiology Ct Abdomen Pelvis W Contrast  Result Date: 09/20/2016 CLINICAL DATA:  Lower abdominal pain and pelvic pain. Nausea and vomiting for several days. EXAM: CT ABDOMEN AND PELVIS WITH CONTRAST TECHNIQUE: Multidetector CT imaging of the abdomen and pelvis was performed using the standard protocol following bolus administration of intravenous contrast. CONTRAST:  ISOVUE-300 IOPAMIDOL (ISOVUE-300) INJECTION 61% COMPARISON:  None FINDINGS: Lower chest: Lung bases are clear. Hepatobiliary: No focal hepatic lesion. Normal gallbladder. No biliary duct dilatation. Pancreas: Pancreas is normal. No ductal dilatation. No pancreatic inflammation. Spleen: Normal spleen Adrenals/urinary tract: Adrenal glands and kidneys are normal. The ureters and bladder normal. Stomach/Bowel: Stomach, small bowel, appendix, and cecum are normal. The colon and rectosigmoid colon are normal. Vascular/Lymphatic: Abdominal aorta is normal caliber with atherosclerotic calcification. There is no retroperitoneal or periportal lymphadenopathy. No pelvic lymphadenopathy. Reproductive: Post hysterectomy Other: No free fluid.  Musculoskeletal: No aggressive osseous lesion. IMPRESSION: 1. No acute abdominopelvic findings. 2. Normal appendix. 3. Post hysterectomy. 4.  Atherosclerotic calcification of the aorta. Electronically Signed   By: Suzy Bouchard M.D.   On: 09/20/2016 11:35   Dg Chest Port 1 View  Result Date: 09/20/2016 CLINICAL DATA:  Cough. EXAM: PORTABLE CHEST 1 VIEW COMPARISON:  Thoracolumbar spine series 09/15/2009. FINDINGS: Mediastinum hilar structures are normal. Lungs are clear. Heart size normal. No pleural effusion or pneumothorax. No acute bony abnormality . IMPRESSION: No acute cardiopulmonary disease. Electronically Signed   By: Marcello Moores  Register   On: 09/20/2016 06:58    Procedures Procedures (including critical care time)  CRITICAL CARE Performed by: Wyn Quaker Total critical care time: 33 minutes Critical care time was exclusive of separately billable procedures and treating other patients. Critical care was necessary to treat or prevent imminent or life-threatening deterioration. Critical care was time spent personally by me on the following activities: development of treatment plan with patient and/or surrogate as well as nursing, discussions with consultants, evaluation of patient's response to treatment, examination of patient, obtaining history from patient or surrogate, ordering and performing treatments and interventions, ordering and review of laboratory studies, ordering and review of radiographic studies, pulse oximetry and re-evaluation of patient's condition.  Sepsis, hypotension.  Medications Ordered in ED Medications  0.9 %  sodium chloride infusion ( Intravenous New Bag/Given 09/20/16 0711)  piperacillin-tazobactam (ZOSYN) IVPB 3.375 g (not administered)  vancomycin (VANCOCIN) 1,250 mg in sodium chloride 0.9 % 250 mL IVPB (not administered)  sodium chloride 0.9 % bolus 1,000 mL (2,000 mLs Intravenous New Bag/Given 09/20/16 1222)  sodium chloride 0.9 % bolus 1,000 mL (0  mLs Intravenous Stopped 09/20/16 0825)    And  sodium chloride 0.9 % bolus 1,000 mL (0 mLs Intravenous Stopped 09/20/16 0912)    And  sodium chloride 0.9 % bolus 1,000 mL (0 mLs Intravenous Stopped 09/20/16 0948)  piperacillin-tazobactam (ZOSYN) IVPB 3.375 g (0 g Intravenous Stopped 09/20/16 0912)  vancomycin (VANCOCIN) IVPB 1000 mg/200 mL premix (0 mg Intravenous Stopped 09/20/16 1018)  acetaminophen (TYLENOL) tablet 1,000 mg (1,000 mg Oral Given 09/20/16 0824)  ondansetron (ZOFRAN) injection 4 mg (4 mg Intravenous Given 09/20/16 0824)  fentaNYL (SUBLIMAZE) injection 50 mcg (50 mcg Intravenous Given 09/20/16 0824)  iopamidol (ISOVUE-300) 61 % injection 100 mL (100 mLs Intravenous Contrast Given 09/20/16 1100)  Initial Impression / Assessment and Plan / ED Course  I have reviewed the triage vital signs and the nursing notes.  Pertinent labs & imaging results that were available during my care of the patient were reviewed by me and considered in my medical decision making (see chart for details).  Clinical Course as of Sep 20 1244  Thu Sep 20, 2016  0707 Informed by RN that patient gave wrong name upon arrival. On re-check patient reports no changes, still feeling dizzy. Not actively vomiting.   [EH]  0754 Attempted to re-check patient, she is in CT.   [EH]  8887 Re-checked, patient is resting comfortably, bp improved slightly, still needs third liter.   [EH]  1001 Re checked patient.  Exam unchanged.  Patient resting comfortably.  Will follow up with CT scan as to status.   [EH]  1023 Spoke with CT, they said that she was in CT earlier but due to false name were unable to actually scan her.  They are getting her now for scan.   [EH]  1204 MRN for patients primary chart is [id number] under Melina Mosteller  [EH]  1211 Patient reassessed, BP is 83/52, exam unchanged.   [EH]  1216 Spoke with hospitalist, will admit  [EH]    Clinical Course User Index [EH] Lorin Glass, PA-C   Kelli Jenkins  presented with hypotension, tachycardia, and tachypnea after six days of N/V/D.  A code sepsis was initiated after evaluation by this provider.  Patient received 4 liters of fluid while in ED, along with empiric vancomycin and zosyn.  CT was obtained, due to abdominal pain, no acute findings.  Patient gave an incorrect name upon arrival which caused delays in her CT scan and blood work. Hospitalists consulted for admission.  Patient will be admitted by Dr. Sheran Fava for continued treatment and evaluation.    Due to the nature of patients illness Dr. Leonides Schanz was involved early on in the care and evaluated the patient.      Final Clinical Impressions(s) / ED Diagnoses   Final diagnoses:  Nausea vomiting and diarrhea  Hypotension, unspecified hypotension type  Sepsis, due to unspecified organism [facility])    New Prescriptions New Prescriptions   No medications on file     Lorin Glass, Hershal Coria 09/20/16 1352    Lorin Glass, PA-C 05/08/17 1202    Ward, Delice Bison, DO 05/08/17 2253

## 2016-09-20 NOTE — Progress Notes (Addendum)
Pharmacy Antibiotic Note  Kelli Jenkins is a 56 y.o. female admitted on 09/20/2016 with sepsis.  Pharmacy has been consulted for Vancomycin and Zosyn dosing.   Plan: Vancomycin 1g IV x 1 given in the ED. Continue with Vancomycin 1250mg  IV q12h. Plan for Vancomycin trough level at steady state. Goal trough level 15-20 mcg/mL. Zosyn 3.375g IV x 1 over 30 min, then Zosyn 3.375g IV q8h (infuse over 4 hours). Monitor renal function, cultures, clinical course.   Height: 5\' 8"  (172.7 cm) Weight: 202 lb (91.6 kg) IBW/kg (Calculated) : 63.9  Temp (24hrs), Avg:98.7 F (37.1 C), Min:98.5 F (36.9 C), Max:98.9 F (37.2 C)   Recent Labs Lab 09/20/16 0630 09/20/16 0726 09/20/16 0727 09/20/16 1024  WBC 16.1*  --   --   --   CREATININE 1.04* 1.00  --   --   LATICACIDVEN  --   --  1.56 0.84    Estimated Creatinine Clearance: 73.5 mL/min (by C-G formula based on SCr of 1 mg/dL).    No Known Allergies  Antimicrobials this admission: 5/31 >> Zosyn >> 5/31 >> Vancomycin >>  Dose adjustments this admission: --  Microbiology results: 5/31 BCx: sent 5/31 UCx: ordered   Thank you for allowing pharmacy to be a part of this patients care.   Lindell Spar, PharmD, BCPS Pager: [phone] 09/20/2016 11:11 AM

## 2016-09-21 ENCOUNTER — Inpatient Hospital Stay (HOSPITAL_COMMUNITY): Payer: Self-pay

## 2016-09-21 DIAGNOSIS — K921 Melena: Secondary | ICD-10-CM

## 2016-09-21 DIAGNOSIS — R079 Chest pain, unspecified: Secondary | ICD-10-CM

## 2016-09-21 DIAGNOSIS — D62 Acute posthemorrhagic anemia: Secondary | ICD-10-CM

## 2016-09-21 DIAGNOSIS — F1410 Cocaine abuse, uncomplicated: Secondary | ICD-10-CM

## 2016-09-21 LAB — CBC
HCT: 20.5 % — ABNORMAL LOW (ref 36.0–46.0)
HCT: 26.8 % — ABNORMAL LOW (ref 36.0–46.0)
HEMATOCRIT: 20.4 % — AB (ref 36.0–46.0)
HEMOGLOBIN: 7 g/dL — AB (ref 12.0–15.0)
HEMOGLOBIN: 7.1 g/dL — AB (ref 12.0–15.0)
Hemoglobin: 9.2 g/dL — ABNORMAL LOW (ref 12.0–15.0)
MCH: 32.6 pg (ref 26.0–34.0)
MCH: 33.2 pg (ref 26.0–34.0)
MCH: 31.8 pg (ref 26.0–34.0)
MCHC: 34.3 g/dL (ref 30.0–36.0)
MCHC: 34.6 g/dL (ref 30.0–36.0)
MCHC: 34.3 g/dL (ref 30.0–36.0)
MCV: 94.9 fL (ref 78.0–100.0)
MCV: 95.8 fL (ref 78.0–100.0)
MCV: 92.7 fL (ref 78.0–100.0)
PLATELETS: 194 10*3/uL (ref 150–400)
Platelets: 178 10*3/uL (ref 150–400)
Platelets: 180 10*3/uL (ref 150–400)
RBC: 2.15 MIL/uL — ABNORMAL LOW (ref 3.87–5.11)
RBC: 2.14 MIL/uL — AB (ref 3.87–5.11)
RBC: 2.89 MIL/uL — ABNORMAL LOW (ref 3.87–5.11)
RDW: 14.1 % (ref 11.5–15.5)
RDW: 14.3 % (ref 11.5–15.5)
RDW: 15 % (ref 11.5–15.5)
WBC: 8.1 10*3/uL (ref 4.0–10.5)
WBC: 7.2 10*3/uL (ref 4.0–10.5)
WBC: 8.3 10*3/uL (ref 4.0–10.5)

## 2016-09-21 LAB — BASIC METABOLIC PANEL
ANION GAP: 3 — AB (ref 5–15)
BUN: 15 mg/dL (ref 6–20)
CO2: 23 mmol/L (ref 22–32)
Calcium: 7.9 mg/dL — ABNORMAL LOW (ref 8.9–10.3)
Chloride: 114 mmol/L — ABNORMAL HIGH (ref 101–111)
Creatinine, Ser: 0.82 mg/dL (ref 0.44–1.00)
Glucose, Bld: 92 mg/dL (ref 65–99)
POTASSIUM: 4.1 mmol/L (ref 3.5–5.1)
SODIUM: 140 mmol/L (ref 135–145)

## 2016-09-21 LAB — URINE CULTURE

## 2016-09-21 LAB — HEMOGLOBIN A1C
Hgb A1c MFr Bld: 5.4 % (ref 4.8–5.6)
Mean Plasma Glucose: 108 mg/dL

## 2016-09-21 LAB — HIV ANTIBODY (ROUTINE TESTING W REFLEX): HIV Screen 4th Generation wRfx: NONREACTIVE

## 2016-09-21 LAB — PREPARE RBC (CROSSMATCH)

## 2016-09-21 LAB — TROPONIN I

## 2016-09-21 LAB — ECHOCARDIOGRAM COMPLETE
Height: 68 in
Weight: 3232 oz

## 2016-09-21 MED ORDER — PANTOPRAZOLE SODIUM 40 MG IV SOLR
40.0000 mg | Freq: Two times a day (BID) | INTRAVENOUS | Status: DC
  Administered 2016-09-22 – 2016-09-23 (×4): 40 mg via INTRAVENOUS
  Filled 2016-09-21 (×4): qty 40

## 2016-09-21 MED ORDER — SODIUM CHLORIDE 0.9 % IV SOLN
Freq: Once | INTRAVENOUS | Status: AC
  Administered 2016-09-21: 12:00:00 via INTRAVENOUS

## 2016-09-21 MED ORDER — SODIUM CHLORIDE 0.9 % IV BOLUS (SEPSIS)
250.0000 mL | Freq: Once | INTRAVENOUS | Status: AC
  Administered 2016-09-21: 250 mL via INTRAVENOUS

## 2016-09-21 MED ORDER — FENTANYL CITRATE (PF) 100 MCG/2ML IJ SOLN
50.0000 ug | Freq: Once | INTRAMUSCULAR | Status: AC
  Administered 2016-09-21: 50 ug via INTRAVENOUS
  Filled 2016-09-21: qty 2

## 2016-09-21 MED ORDER — GI COCKTAIL ~~LOC~~: 30.0000 mL | Freq: Three times a day (TID) | ORAL | Status: DC | PRN

## 2016-09-21 MED ORDER — SODIUM CHLORIDE 0.9 % IV SOLN
80.0000 mg | Freq: Once | INTRAVENOUS | Status: AC
  Administered 2016-09-21: 11:00:00 80 mg via INTRAVENOUS
  Filled 2016-09-21: qty 80

## 2016-09-21 NOTE — Consult Note (Incomplete Revision)
Referring Provider: Dr. Wynelle Cleveland Primary Care Physician:  Patient, No Pcp Per Primary Gastroenterologist:  UNASSIGNED  Reason for Consultation:  Melena  HPI: Kelli Jenkins is a 56 y.o. female with cocaine abuse (reports last use was a week ago) admitted for N/VD/abdominal pain. Profuse N/V for the past week that was non-bloody and denies coffee grounds emesis. Has been having intermittent abdominal pain for the past week that she cannot describe. Has been having black stools daily for the past week. Denies red blood per rectum. Takes NSAIDs several times per day for years. Denies alcohol use. Used cocaine last week and uses it on occasion. Denies any history of peptic ulcer disease. During my entire evaluation she keeps her eyes closed and was sleeping when I entered the room. A nurse is present during my evaluation. Hgb 7.1 (15, 10.8 on 09/20/16). Heme positive.  Past Medical History:  Diagnosis Date  . Cancer ([place])    uterine  . Cocaine addiction ([place])   . Hypokalemia   . Nephrolithiasis   . Pyelonephritis   . SIRS (systemic inflammatory response syndrome) ([facility])   . Tobacco abuse   . UTI (urinary tract infection) 06/2013    Past Surgical History:  Procedure Laterality Date  . ESOPHAGOGASTRODUODENOSCOPY N/A 09/22/2016   Procedure: ESOPHAGOGASTRODUODENOSCOPY (EGD);  Surgeon: Ronald Lobo, MD;  Location: [facility];  Service: Endoscopy;  Laterality: N/A;  . RENAL ARTERY STENT     stones  . TUBAL LIGATION    . VAGINAL HYSTERECTOMY      Prior to Admission medications   Medication Sig Start Date End Date Taking? Authorizing Provider  bismuth subsalicylate (PEPTO BISMOL) 262 MG/15ML suspension Take 30 mLs by mouth every 6 (six) hours as needed.   Yes [provider]    Scheduled Meds: . famotidine  20 mg Oral Daily  . nicotine  14 mg Transdermal Daily  . pantoprazole (PROTONIX) IV  40 mg Intravenous Q12H  . sodium chloride flush  3 mL Intravenous Q12H   Continuous  Infusions: . sodium chloride Stopped (09/20/16 1611)  . 0.9 % NaCl with KCl 20 mEq / L 125 mL/hr at 09/20/16 1611   PRN Meds:.acetaminophen **OR** acetaminophen, calcium carbonate, gi cocktail, ondansetron **OR** ondansetron (ZOFRAN) IV  Allergies as of 09/20/2016 - Review Complete 06/08/2016  Allergen Reaction Noted  . Darvocet [propoxyphene n-acetaminophen] Swelling 04/12/2011  . Ibuprofen Swelling 04/12/2011  . Tramadol Swelling 01/16/2014    No family history on file.  Social History   Socioeconomic History  . Marital status: Married    Spouse name: Not on file  . Number of children: Not on file  . Years of education: Not on file  . Highest education level: Not on file  Social Needs  . Financial resource strain: Not on file  . Food insecurity - worry: Not on file  . Food insecurity - inability: Not on file  . Transportation needs - medical: Not on file  . Transportation needs - non-medical: Not on file  Occupational History  . Not on file  Tobacco Use  . Smoking status: Current Every Day Smoker    Packs/day: 1.00    Types: Cigarettes  . Smokeless tobacco: Never Used  Substance and Sexual Activity  . Alcohol use: Yes    Comment: occasionally  . Drug use: No    Comment: HISTORY OF COCAINE USE  . Sexual activity: No    Birth control/protection: Surgical  Other Topics Concern  . Not on file  Social History Narrative  .  Not on file    Review of Systems: All negative except as stated above in HPI.  Physical Exam: Vital signs: Vitals:   09/21/16 1430 09/21/16 1459  BP: (!) 87/50 (!) 108/57  Pulse: 70 75  Resp: 16 16  Temp: 98.5 F (36.9 C) 98.6 F (37 C)     General:   Lethargic, Well-developed, well-nourished, no acute distress HEENT: oropharynx clear  Lungs:  Clear throughout to auscultation.   No wheezes, crackles, or rhonchi. No acute distress. Heart:  Regular rate and rhythm; no murmurs, clicks, rubs,  or gallops. Abdomen: diffusely tender with  guarding, soft, nondistended, +BS  Rectal:  Deferred Ext: no edema  GI:  Lab Results:  Recent Labs  09/20/16 0630 09/20/16 0726 09/21/16 0235 09/21/16 0825  WBC 16.1*  --  8.1 7.2  HGB 10.8* 15.0 7.0* 7.1*  HCT 31.9* 44.0 20.4* 20.5*  PLT 306  --  178 180   BMET  Recent Labs  09/20/16 0630 09/20/16 0726 09/21/16 0235  NA 139 140 140  K 3.5 3.4* 4.1  CL 108 108 114*  CO2 23  --  23  GLUCOSE 143* 132* 92  BUN 40* 39* 15  CREATININE 1.04* 1.00 0.82  CALCIUM 8.5*  --  7.9*   LFT  Recent Labs  09/20/16 0630  PROT 6.2*  ALBUMIN 3.3*  AST 14*  ALT 9*  ALKPHOS 59  BILITOT 0.4   PT/INR No results for input(s): LABPROT, INR in the last 72 hours.   Studies/Results: Ct Abdomen Pelvis W Contrast  Result Date: 09/20/2016 CLINICAL DATA:  Lower abdominal pain and pelvic pain. Nausea and vomiting for several days. EXAM: CT ABDOMEN AND PELVIS WITH CONTRAST TECHNIQUE: Multidetector CT imaging of the abdomen and pelvis was performed using the standard protocol following bolus administration of intravenous contrast. CONTRAST:  ISOVUE-300 IOPAMIDOL (ISOVUE-300) INJECTION 61% COMPARISON:  None FINDINGS: Lower chest: Lung bases are clear. Hepatobiliary: No focal hepatic lesion. Normal gallbladder. No biliary duct dilatation. Pancreas: Pancreas is normal. No ductal dilatation. No pancreatic inflammation. Spleen: Normal spleen Adrenals/urinary tract: Adrenal glands and kidneys are normal. The ureters and bladder normal. Stomach/Bowel: Stomach, small bowel, appendix, and cecum are normal. The colon and rectosigmoid colon are normal. Vascular/Lymphatic: Abdominal aorta is normal caliber with atherosclerotic calcification. There is no retroperitoneal or periportal lymphadenopathy. No pelvic lymphadenopathy. Reproductive: Post hysterectomy Other: No free fluid. Musculoskeletal: No aggressive osseous lesion. IMPRESSION: 1. No acute abdominopelvic findings. 2. Normal appendix. 3. Post  hysterectomy. 4.  Atherosclerotic calcification of the aorta. Electronically Signed   By: Suzy Bouchard M.D.   On: 09/20/2016 11:35   Dg Chest Port 1 View  Result Date: 09/20/2016 CLINICAL DATA:  Cough. EXAM: PORTABLE CHEST 1 VIEW COMPARISON:  Thoracolumbar spine series 09/15/2009. FINDINGS: Mediastinum hilar structures are normal. Lungs are clear. Heart size normal. No pleural effusion or pneumothorax. No acute bony abnormality . IMPRESSION: No acute cardiopulmonary disease. Electronically Signed   By: Marcello Moores  Register   On: 09/20/2016 06:58    Impression/Plan: 56 yo with a GI bleed concerning for a peptic ulcer bleed with melena in the setting of NSAID abuse and cocaine abuse. Continue Protonix 40 mg IV Q 12 hours. EGD needed this weekend but likely will need Propofol for sedation due to her drug abuse. Clear liquid diet. NPO p MN. Dr. Cristina Gong will re-evaluate tomorrow and decide on timing of EGD.    LOS: 1 day   [place] C.  09/21/2016, 3:08 PM  Pager [phone]  AFTER 5 pm or on weekends please call [phone]

## 2016-09-21 NOTE — Care Management Note (Addendum)
Case Management Note  Patient Details  Name: Kelli Jenkins MRN: [id number] Date of Birth: 06-04-1960  Subjective/Objective:                  56 y.o. female with history of tobacco abuse, cocaine abuse, depression, and medical noncompliance who presents with nausea, vomiting, and diarrhea.  She states that one week ago she developed nausea with vomiting, approximately 3-4 times per day, and watery diarrhea about 3-4 times per day. Some of her emesis has been bilious in appearance. She denies blood or coffee grounds. Her diarrhea over the last 2-3 days has been very black. She consumed an entire bottle of Pepto-Bismol in the last week which did not help her symptoms.  Fevers have been has high as 102F.  She was drinking water and trying to drink soup but had a hard time keeping fluids down. She has had 7 out of 10 cramping abdominal pain worse in the bilateral lower quadrants that comes and goes. Nothing has made it better or worse.  She came to the emergency department today because she felt very lightheaded and dehydrated.  She reports being treated with antibiotics for pneumonia twice in the last 4 months.  ActioDate:  September 21, 2016  Chart reviewed for concurrent status and case management needs.  Will continue to follow patient progress.  Discharge Planning: following for needs  Expected discharge date: [id number]  Velva Harman, BSN, [place], [place] n/Plan:   Expected Discharge Date:   (unknown)               Expected Discharge Plan:  Home/Self Care  In-House Referral:     Discharge planning Services  CM Consult  Post Acute Care Choice:    Choice offered to:     DME Arranged:    DME Agency:     HH Arranged:    HH Agency:     Status of Service:  In process, will continue to follow  If discussed at Long Length of Stay Meetings, dates discussed:    Additional Comments:  Leeroy Cha, RN 09/21/2016, 9:35 AM

## 2016-09-21 NOTE — Progress Notes (Incomplete Revision)
PROGRESS NOTE    Kelli Jenkins   ZRA:[id number]  DOB: 12/18/1960  DOA: 09/20/2016 PCP: Patient, No Pcp Per   Brief Narrative:  Kelli Jenkins is a 56 y.o. female with history of tobacco abuse, cocaine abuse, depression, and medical noncompliance who presents with nausea, vomiting and black stools. She states symptoms have been ongoing for about 3-4 days now. She has a h/o gastritis and has used PPIs in the past but she is not always able to afford them. For the past week she has been having upper abdominal pain and has been taking Alleve, about 6 tabs a day. No h/o EGD in the past. She was feeling lightheaded and having ongoing abdominal pain and therefore came into the ER.   Subjective: No further stool in the hospital. Still quite nauseated and tender in the upper abdomen.   Assessment & Plan:   Principal Problem:   Melena - black stool which is heme + - start PPI, make NPO and avoid NSAIDS - she claims she does not drink alcohol - GI has been consulted - no further episodes since coming into the hospital  Active Problems:   Anemia associated with acute blood loss - Hb 15 >> 7.1- the drop is partially due to hydration(was hemoconcentrated on admission) and partly due to acute blood loss - Hb in 5/18 was 10.8 - transfuse 2 U PRBC  Dehydration - improving - cont IVF  Chest pain - occurring during episodes of vomiting and likely due to esophageal pain rather than cardiac issues    Tobacco abuse  - Nicotine patch    Cocaine abuse - admits to snorting cocaine a few times a month   DVT prophylaxis: SCDs Code Status: Full code Family Communication:  Disposition Plan: follow on med surg Consultants:   GI Procedures:    Antimicrobials:  Anti-infectives (From admission, onward)   Start     Dose/Rate Route Frequency Ordered Stop   09/20/16 1800  vancomycin (VANCOCIN) 1,250 mg in sodium chloride 0.9 % 250 mL IVPB  Status:  Discontinued     1,250 mg 166.7 mL/hr over  90 Minutes Intravenous Every 12 hours 09/20/16 1106 09/21/16 1004   09/20/16 1600  piperacillin-tazobactam (ZOSYN) IVPB 3.375 g  Status:  Discontinued     3.375 g 12.5 mL/hr over 240 Minutes Intravenous Every 8 hours 09/20/16 0715 09/21/16 1004   09/20/16 0630  piperacillin-tazobactam (ZOSYN) IVPB 3.375 g     3.375 g 100 mL/hr over 30 Minutes Intravenous  Once 09/20/16 0626 09/20/16 1500   09/20/16 0630  vancomycin (VANCOCIN) IVPB 1000 mg/200 mL premix     1,000 mg 200 mL/hr over 60 Minutes Intravenous  Once 09/20/16 0626 09/20/16 1501       Objective: Vitals:   09/21/16 0600 09/21/16 1200 09/21/16 1230 09/21/16 1414  BP: 91/62 (!) 99/42 (!) 95/45 (!) 95/51  Pulse: 74 75 78 73  Resp: 18 18 17 16   Temp: 98.9 F (37.2 C) 98.2 F (36.8 C) 98.4 F (36.9 C) 98.6 F (37 C)  TempSrc:  Oral Oral Oral  SpO2: 96% 98% 98% 98%  Weight:      Height:        Intake/Output Summary (Last 24 hours) at 09/21/16 1422 Last data filed at 09/21/16 0830  Gross per 24 hour  Intake          3562.08 ml  Output             2500 ml  Net  1062.08 ml   Filed Weights   09/20/16 0827  Weight: 91.6 kg (202 lb)    Examination: General exam: Appears comfortable  HEENT: PERRLA, oral mucosa moist, no sclera icterus or thrush Respiratory system: Clear to auscultation. Respiratory effort normal. Cardiovascular system: S1 & S2 heard, RRR.  No murmurs  Gastrointestinal system: Abdomen soft, epigastric tenderness. nondistended. Normal bowel sound. No organomegaly Central nervous system: Alert and oriented. No focal neurological deficits. Extremities: No cyanosis, clubbing or edema Skin: No rashes or ulcers Psychiatry:  Mood & affect appropriate.     Data Reviewed: I have personally reviewed following labs and imaging studies  CBC:  Recent Labs Lab 09/20/16 0630 09/20/16 0726 09/21/16 0235 09/21/16 0825  WBC 16.1*  --  8.1 7.2  NEUTROABS 12.1*  --   --   --   HGB 10.8* 15.0 7.0* 7.1*    HCT 31.9* 44.0 20.4* 20.5*  MCV 94.7  --  94.9 95.8  PLT 306  --  178 175   Basic Metabolic Panel:  Recent Labs Lab 09/20/16 0630 09/20/16 0726 09/21/16 0235  NA 139 140 140  K 3.5 3.4* 4.1  CL 108 108 114*  CO2 23  --  23  GLUCOSE 143* 132* 92  BUN 40* 39* 15  CREATININE 1.04* 1.00 0.82  CALCIUM 8.5*  --  7.9*   GFR: Estimated Creatinine Clearance: 89.6 mL/min (by C-G formula based on SCr of 0.82 mg/dL). Liver Function Tests:  Recent Labs Lab 09/20/16 0630  AST 14*  ALT 9*  ALKPHOS 59  BILITOT 0.4  PROT 6.2*  ALBUMIN 3.3*    Recent Labs Lab 09/20/16 0630  LIPASE 26   No results for input(s): AMMONIA in the last 168 hours. Coagulation Profile: No results for input(s): INR, PROTIME in the last 168 hours. Cardiac Enzymes:  Recent Labs Lab 09/20/16 0630 09/20/16 1506 09/20/16 2043 09/21/16 0235  CKTOTAL 21*  --   --   --   TROPONINI  --  <0.03 <0.03 <0.03   BNP (last 3 results) No results for input(s): PROBNP in the last 8760 hours. HbA1C:  Recent Labs  09/20/16 1506  HGBA1C 5.4   CBG: No results for input(s): GLUCAP in the last 168 hours. Lipid Profile: No results for input(s): CHOL, HDL, LDLCALC, TRIG, CHOLHDL, LDLDIRECT in the last 72 hours. Thyroid Function Tests: No results for input(s): TSH, T4TOTAL, FREET4, T3FREE, THYROIDAB in the last 72 hours. Anemia Panel: No results for input(s): VITAMINB12, FOLATE, FERRITIN, TIBC, IRON, RETICCTPCT in the last 72 hours. Urine analysis:    Component Value Date/Time   COLORURINE YELLOW 09/20/2016 1120   APPEARANCEUR HAZY (A) 09/20/2016 1120   LABSPEC 1.026 09/20/2016 1120   PHURINE 6.0 09/20/2016 1120   GLUCOSEU NEGATIVE 09/20/2016 1120   HGBUR NEGATIVE 09/20/2016 [place] 09/20/2016 1120   KETONESUR NEGATIVE 09/20/2016 1120   PROTEINUR NEGATIVE 09/20/2016 1120   NITRITE NEGATIVE 09/20/2016 1120   LEUKOCYTESUR TRACE (A) 09/20/2016 1120   Sepsis  Labs: @LABRCNTIP (procalcitonin:4,lacticidven:4) ) Recent Results (from the past 240 hour(s))  Blood Culture (routine x 2)     Status: None (Preliminary result)   Collection Time: 09/20/16  6:39 AM  Result Value Ref Range Status   Specimen Description BLOOD RIGHT ANTECUBITAL  Final   Special Requests   Final    BOTTLES DRAWN AEROBIC AND ANAEROBIC Blood Culture adequate volume   Culture   Final    NO GROWTH 1 DAY Performed at [facility],  [address], [place], [place] [address]    Report Status PENDING  Incomplete  Blood Culture (routine x 2)     Status: None (Preliminary result)   Collection Time: 09/20/16  7:14 AM  Result Value Ref Range Status   Specimen Description BLOOD LEFT ARM  Final   Special Requests   Final    BOTTLES DRAWN AEROBIC AND ANAEROBIC Blood Culture adequate volume   Culture   Final    NO GROWTH 1 DAY Performed at [facility], [place] [address], [place], [place] [address]    Report Status PENDING  Incomplete  Urine culture     Status: Abnormal   Collection Time: 09/20/16 11:20 AM  Result Value Ref Range Status   Specimen Description URINE, CLEAN CATCH  Final   Special Requests NONE  Final   Culture MULTIPLE SPECIES PRESENT, SUGGEST RECOLLECTION (A)  Final   Report Status 09/21/2016 FINAL  Final         Radiology Studies: Ct Abdomen Pelvis W Contrast  Result Date: 09/20/2016 CLINICAL DATA:  Lower abdominal pain and pelvic pain. Nausea and vomiting for several days. EXAM: CT ABDOMEN AND PELVIS WITH CONTRAST TECHNIQUE: Multidetector CT imaging of the abdomen and pelvis was performed using the standard protocol following bolus administration of intravenous contrast. CONTRAST:  ISOVUE-300 IOPAMIDOL (ISOVUE-300) INJECTION 61% COMPARISON:  None FINDINGS: Lower chest: Lung bases are clear. Hepatobiliary: No focal hepatic lesion. Normal gallbladder. No biliary duct dilatation. Pancreas: Pancreas is normal. No ductal dilatation. No pancreatic  inflammation. Spleen: Normal spleen Adrenals/urinary tract: Adrenal glands and kidneys are normal. The ureters and bladder normal. Stomach/Bowel: Stomach, small bowel, appendix, and cecum are normal. The colon and rectosigmoid colon are normal. Vascular/Lymphatic: Abdominal aorta is normal caliber with atherosclerotic calcification. There is no retroperitoneal or periportal lymphadenopathy. No pelvic lymphadenopathy. Reproductive: Post hysterectomy Other: No free fluid. Musculoskeletal: No aggressive osseous lesion. IMPRESSION: 1. No acute abdominopelvic findings. 2. Normal appendix. 3. Post hysterectomy. 4.  Atherosclerotic calcification of the aorta. Electronically Signed   By: Suzy Bouchard M.D.   On: 09/20/2016 11:35   Dg Chest Port 1 View  Result Date: 09/20/2016 CLINICAL DATA:  Cough. EXAM: PORTABLE CHEST 1 VIEW COMPARISON:  Thoracolumbar spine series 09/15/2009. FINDINGS: Mediastinum hilar structures are normal. Lungs are clear. Heart size normal. No pleural effusion or pneumothorax. No acute bony abnormality . IMPRESSION: No acute cardiopulmonary disease. Electronically Signed   By: Marcello Moores  Register   On: 09/20/2016 06:58      Scheduled Meds: . famotidine  20 mg Oral Daily  . nicotine  14 mg Transdermal Daily  . pantoprazole (PROTONIX) IV  40 mg Intravenous Q12H  . sodium chloride flush  3 mL Intravenous Q12H   Continuous Infusions: . sodium chloride Stopped (09/20/16 1611)  . 0.9 % NaCl with KCl 20 mEq / L 125 mL/hr at 09/20/16 1611     LOS: 1 day    Time spent in minutes: 35    Debbe Odea, MD [facility] Pager: [email or web address] Password [email or web address] 09/21/2016, 2:22 PM

## 2016-09-21 NOTE — Progress Notes (Signed)
Please cosign corrected note in order to complete chart correction case 

## 2016-09-21 NOTE — Progress Notes (Deleted)
Pts IV removed with dressing intact over site. Pt denies pain at the time of d/c. Pts dressing to right side is clean dry and intact with family members educated on dressing change and given some supplies to maintain a clean and dry dressing. Pt transported to front entrance via wheelchair with family and nursing staff present.

## 2016-09-21 NOTE — Progress Notes (Signed)
  Echocardiogram 2D Echocardiogram has been performed.  Kelli Jenkins M 09/21/2016, 1:18 PM

## 2016-09-21 NOTE — Consult Note (Addendum)
Referring Provider: Dr. Wynelle Cleveland Primary Care Physician:  Patient, No Pcp Per Primary Gastroenterologist:  UNASSIGNED  Reason for Consultation:  Melena  HPI: Kelli Jenkins is a 56 y.o. female with cocaine abuse (reports last use was a week ago) admitted for N/VD/abdominal pain. Profuse N/V for the past week that was non-bloody and denies coffee grounds emesis. Has been having intermittent abdominal pain for the past week that she cannot describe. Has been having black stools daily for the past week. Denies red blood per rectum. Takes NSAIDs several times per day for years. Denies alcohol use. Used cocaine last week and uses it on occasion. Denies any history of peptic ulcer disease. During my entire evaluation she keeps her eyes closed and was sleeping when I entered the room. A nurse is present during my evaluation. Hgb 7.1 (15, 10.8 on 09/20/16). Heme positive.  Past Medical History:  Diagnosis Date   Cancer ([place])    uterine   Cocaine addiction ([place])    Hypokalemia    Nephrolithiasis    Pyelonephritis    SIRS (systemic inflammatory response syndrome) ([facility])    Tobacco abuse    UTI (urinary tract infection) 06/2013    Past Surgical History:  Procedure Laterality Date   ESOPHAGOGASTRODUODENOSCOPY N/A 09/22/2016   Procedure: ESOPHAGOGASTRODUODENOSCOPY (EGD);  Surgeon: Ronald Lobo, MD;  Location: [facility];  Service: Endoscopy;  Laterality: N/A;   RENAL ARTERY STENT     stones   TUBAL LIGATION     VAGINAL HYSTERECTOMY      Prior to Admission medications   Medication Sig Start Date End Date Taking? Authorizing Provider  bismuth subsalicylate (PEPTO BISMOL) 262 MG/15ML suspension Take 30 mLs by mouth every 6 (six) hours as needed.   Yes [provider]    Scheduled Meds:  famotidine  20 mg Oral Daily   nicotine  14 mg Transdermal Daily   pantoprazole (PROTONIX) IV  40 mg Intravenous Q12H   sodium chloride flush  3 mL Intravenous Q12H   Continuous  Infusions:  sodium chloride Stopped (09/20/16 1611)   0.9 % NaCl with KCl 20 mEq / L 125 mL/hr at 09/20/16 1611   PRN Meds:.acetaminophen **OR** acetaminophen, calcium carbonate, gi cocktail, ondansetron **OR** ondansetron (ZOFRAN) IV  Allergies as of 09/20/2016 - Review Complete 06/08/2016  Allergen Reaction Noted   Darvocet [propoxyphene n-acetaminophen] Swelling 04/12/2011   Ibuprofen Swelling 04/12/2011   Tramadol Swelling 01/16/2014    No family history on file.  Social History   Socioeconomic History   Marital status: Married    Spouse name: Not on file   Number of children: Not on file   Years of education: Not on file   Highest education level: Not on file  Social Needs   Financial resource strain: Not on file   Food insecurity - worry: Not on file   Food insecurity - inability: Not on file   Transportation needs - medical: Not on file   Transportation needs - non-medical: Not on file  Occupational History   Not on file  Tobacco Use   Smoking status: Current Every Day Smoker    Packs/day: 1.00    Types: Cigarettes   Smokeless tobacco: Never Used  Substance and Sexual Activity   Alcohol use: Yes    Comment: occasionally   Drug use: No    Comment: HISTORY OF COCAINE USE   Sexual activity: No    Birth control/protection: Surgical  Other Topics Concern   Not on file  Social History Narrative  Not on file    Review of Systems: All negative except as stated above in HPI.  Physical Exam: Vital signs: Vitals:   09/21/16 1430 09/21/16 1459  BP: (!) 87/50 (!) 108/57  Pulse: 70 75  Resp: 16 16  Temp: 98.5 F (36.9 C) 98.6 F (37 C)     General:   Lethargic, Well-developed, well-nourished, no acute distress HEENT: oropharynx clear  Lungs:  Clear throughout to auscultation.   No wheezes, crackles, or rhonchi. No acute distress. Heart:  Regular rate and rhythm; no murmurs, clicks, rubs,  or gallops. Abdomen: diffusely tender with  guarding, soft, nondistended, +BS  Rectal:  Deferred Ext: no edema  GI:  Lab Results:  Recent Labs  09/20/16 0630 09/20/16 0726 09/21/16 0235 09/21/16 0825  WBC 16.1*  --  8.1 7.2  HGB 10.8* 15.0 7.0* 7.1*  HCT 31.9* 44.0 20.4* 20.5*  PLT 306  --  178 180   BMET  Recent Labs  09/20/16 0630 09/20/16 0726 09/21/16 0235  NA 139 140 140  K 3.5 3.4* 4.1  CL 108 108 114*  CO2 23  --  23  GLUCOSE 143* 132* 92  BUN 40* 39* 15  CREATININE 1.04* 1.00 0.82  CALCIUM 8.5*  --  7.9*   LFT  Recent Labs  09/20/16 0630  PROT 6.2*  ALBUMIN 3.3*  AST 14*  ALT 9*  ALKPHOS 59  BILITOT 0.4   PT/INR No results for input(s): LABPROT, INR in the last 72 hours.   Studies/Results: Ct Abdomen Pelvis W Contrast  Result Date: 09/20/2016 CLINICAL DATA:  Lower abdominal pain and pelvic pain. Nausea and vomiting for several days. EXAM: CT ABDOMEN AND PELVIS WITH CONTRAST TECHNIQUE: Multidetector CT imaging of the abdomen and pelvis was performed using the standard protocol following bolus administration of intravenous contrast. CONTRAST:  ISOVUE-300 IOPAMIDOL (ISOVUE-300) INJECTION 61% COMPARISON:  None FINDINGS: Lower chest: Lung bases are clear. Hepatobiliary: No focal hepatic lesion. Normal gallbladder. No biliary duct dilatation. Pancreas: Pancreas is normal. No ductal dilatation. No pancreatic inflammation. Spleen: Normal spleen Adrenals/urinary tract: Adrenal glands and kidneys are normal. The ureters and bladder normal. Stomach/Bowel: Stomach, small bowel, appendix, and cecum are normal. The colon and rectosigmoid colon are normal. Vascular/Lymphatic: Abdominal aorta is normal caliber with atherosclerotic calcification. There is no retroperitoneal or periportal lymphadenopathy. No pelvic lymphadenopathy. Reproductive: Post hysterectomy Other: No free fluid. Musculoskeletal: No aggressive osseous lesion. IMPRESSION: 1. No acute abdominopelvic findings. 2. Normal appendix. 3. Post  hysterectomy. 4.  Atherosclerotic calcification of the aorta. Electronically Signed   By: Suzy Bouchard M.D.   On: 09/20/2016 11:35   Dg Chest Port 1 View  Result Date: 09/20/2016 CLINICAL DATA:  Cough. EXAM: PORTABLE CHEST 1 VIEW COMPARISON:  Thoracolumbar spine series 09/15/2009. FINDINGS: Mediastinum hilar structures are normal. Lungs are clear. Heart size normal. No pleural effusion or pneumothorax. No acute bony abnormality . IMPRESSION: No acute cardiopulmonary disease. Electronically Signed   By: Marcello Moores  Register   On: 09/20/2016 06:58    Impression/Plan: 56 yo with a GI bleed concerning for a peptic ulcer bleed with melena in the setting of NSAID abuse and cocaine abuse. Continue Protonix 40 mg IV Q 12 hours. EGD needed this weekend but likely will need Propofol for sedation due to her drug abuse. Clear liquid diet. NPO p MN. Dr. Cristina Gong will re-evaluate tomorrow and decide on timing of EGD.    LOS: 1 day   [place] C.  09/21/2016, 3:08 PM  Pager [phone]  AFTER 5 pm or on weekends please call [phone]

## 2016-09-22 ENCOUNTER — Encounter (HOSPITAL_COMMUNITY)
Admission: EM | Disposition: A | Discharge status: Home / Self Care | Payer: Self-pay | Source: Home / Self Care | Attending: Internal Medicine

## 2016-09-22 ENCOUNTER — Inpatient Hospital Stay (HOSPITAL_COMMUNITY): Payer: Self-pay | Admitting: Certified Registered Nurse Anesthetist

## 2016-09-22 ENCOUNTER — Other Ambulatory Visit: Payer: Self-pay | Admitting: Gastroenterology

## 2016-09-22 HISTORY — PX: ESOPHAGOGASTRODUODENOSCOPY: SHX5428

## 2016-09-22 LAB — CBC
HEMATOCRIT: 24.5 % — AB (ref 36.0–46.0)
HEMOGLOBIN: 8.5 g/dL — AB (ref 12.0–15.0)
MCH: 31.8 pg (ref 26.0–34.0)
MCHC: 34.7 g/dL (ref 30.0–36.0)
MCV: 91.8 fL (ref 78.0–100.0)
Platelets: 186 10*3/uL (ref 150–400)
RBC: 2.67 MIL/uL — ABNORMAL LOW (ref 3.87–5.11)
RDW: 15.3 % (ref 11.5–15.5)
WBC: 8.3 10*3/uL (ref 4.0–10.5)

## 2016-09-22 LAB — BASIC METABOLIC PANEL
ANION GAP: 5 (ref 5–15)
BUN: 13 mg/dL (ref 6–20)
CHLORIDE: 108 mmol/L (ref 101–111)
CO2: 27 mmol/L (ref 22–32)
Calcium: 8.4 mg/dL — ABNORMAL LOW (ref 8.9–10.3)
Creatinine, Ser: 0.8 mg/dL (ref 0.44–1.00)
GFR calc non Af Amer: >60 mL/min (ref >60)
Glucose, Bld: 91 mg/dL (ref 65–99)
POTASSIUM: 3.9 mmol/L (ref 3.5–5.1)
Sodium: 140 mmol/L (ref 135–145)

## 2016-09-22 SURGERY — EGD (ESOPHAGOGASTRODUODENOSCOPY)
Anesthesia: Monitor Anesthesia Care

## 2016-09-22 MED ORDER — LACTATED RINGERS IV SOLN
INTRAVENOUS | Status: DC | PRN
  Administered 2016-09-22: 12:00:00 via INTRAVENOUS

## 2016-09-22 MED ORDER — LIDOCAINE 2% (20 MG/ML) 5 ML SYRINGE
INTRAMUSCULAR | Status: AC
Start: 2016-09-22 — End: 2016-09-22
  Filled 2016-09-22: qty 5

## 2016-09-22 MED ORDER — PROPOFOL 10 MG/ML IV BOLUS
INTRAVENOUS | Status: AC
  Filled 2016-09-22: qty 20

## 2016-09-22 MED ORDER — MIDAZOLAM HCL 5 MG/5ML IJ SOLN
INTRAMUSCULAR | Status: DC | PRN
  Administered 2016-09-22: 2 mg via INTRAVENOUS

## 2016-09-22 MED ORDER — SODIUM CHLORIDE 0.9 % IV SOLN: INTRAVENOUS | Status: DC

## 2016-09-22 MED ORDER — MIDAZOLAM HCL 2 MG/2ML IJ SOLN
INTRAMUSCULAR | Status: AC
  Filled 2016-09-22: qty 2

## 2016-09-22 MED ORDER — ONDANSETRON HCL 4 MG/2ML IJ SOLN
INTRAMUSCULAR | Status: AC
  Filled 2016-09-22: qty 2

## 2016-09-22 MED ORDER — PROPOFOL 10 MG/ML IV BOLUS
INTRAVENOUS | Status: DC | PRN
  Administered 2016-09-22: 20 mg via INTRAVENOUS

## 2016-09-22 MED ORDER — LIDOCAINE HCL (CARDIAC) 20 MG/ML IV SOLN
INTRAVENOUS | Status: DC | PRN
  Administered 2016-09-22: 50 mg via INTRAVENOUS

## 2016-09-22 MED ORDER — SUCRALFATE 1 GM/10ML PO SUSP
1.0000 g | Freq: Three times a day (TID) | ORAL | Status: DC
  Administered 2016-09-22 – 2016-09-23 (×4): 1 g via ORAL
  Filled 2016-09-22 (×5): qty 10

## 2016-09-22 MED ORDER — PROPOFOL 500 MG/50ML IV EMUL
INTRAVENOUS | Status: DC | PRN
  Administered 2016-09-22: 100 ug/kg/min via INTRAVENOUS

## 2016-09-22 NOTE — Progress Notes (Signed)
Please cosign corrected note in order to complete chart correction case 

## 2016-09-22 NOTE — H&P (View-Only) (Signed)
Referring Provider: Dr. Wynelle Cleveland Primary Care Physician:  Patient, No Pcp Per Primary Gastroenterologist:  UNASSIGNED  Reason for Consultation:  Melena  HPI: Kelli Jenkins is a 56 y.o. female with cocaine abuse (reports last use was a week ago) admitted for N/VD/abdominal pain. Profuse N/V for the past week that was non-bloody and denies coffee grounds emesis. Has been having intermittent abdominal pain for the past week that she cannot describe. Has been having black stools daily for the past week. Denies red blood per rectum. Takes NSAIDs several times per day for years. Denies alcohol use. Used cocaine last week and uses it on occasion. Denies any history of peptic ulcer disease. During my entire evaluation she keeps her eyes closed and was sleeping when I entered the room. A nurse is present during my evaluation. Hgb 7.1 (15, 10.8 on 09/20/16). Heme positive.  Past Medical History:  Diagnosis Date  . Cervical cancer ([place])   . Cocaine abuse   . Depression   . Noncompliance   . Pneumonia   . Tobacco abuse     Past Surgical History:  Procedure Laterality Date  . CERVICAL CONIZATION W/BX      Prior to Admission medications   Medication Sig Start Date End Date Taking? Authorizing Provider  bismuth subsalicylate (PEPTO BISMOL) 262 MG/15ML suspension Take 30 mLs by mouth every 6 (six) hours as needed.   Yes [provider]    Scheduled Meds: . famotidine  20 mg Oral Daily  . nicotine  14 mg Transdermal Daily  . pantoprazole (PROTONIX) IV  40 mg Intravenous Q12H  . sodium chloride flush  3 mL Intravenous Q12H   Continuous Infusions: . sodium chloride Stopped (09/20/16 1611)  . 0.9 % NaCl with KCl 20 mEq / L 125 mL/hr at 09/20/16 1611   PRN Meds:.acetaminophen **OR** acetaminophen, calcium carbonate, gi cocktail, ondansetron **OR** ondansetron (ZOFRAN) IV  Allergies as of 09/20/2016 - Review Complete 09/20/2016  Allergen Reaction Noted  . Ibuprofen Swelling 09/20/2016  .  Tramadol Swelling 09/20/2016    Family History  Problem Relation Age of Onset  . Diabetes Mellitus II Mother   . COPD Mother   . Diabetes Brother   . High blood pressure Brother   . Lung cancer Maternal Grandfather   . Diabetes Brother   . Diabetes Brother     Social History   Social History  . Marital status: Legally Separated    Spouse name: N/A  . Number of children: N/A  . Years of education: N/A   Occupational History  . disability     filing for disability   Social History Main Topics  . Smoking status: Current Every Day Smoker    Packs/day: 1.00    Years: 30.00    Types: Cigarettes  . Smokeless tobacco: Never Used  . Alcohol use No  . Drug use: Yes     Comment: hx of cocaine, last used several months ago  . Sexual activity: Yes    Partners: Male   Other Topics Concern  . Not on file   Social History Narrative  . No narrative on file    Review of Systems: All negative except as stated above in HPI.  Physical Exam: Vital signs: Vitals:   09/21/16 1430 09/21/16 1459  BP: (!) 87/50 (!) 108/57  Pulse: 70 75  Resp: 16 16  Temp: 98.5 F (36.9 C) 98.6 F (37 C)     General:   Lethargic, Well-developed, well-nourished, no acute distress HEENT: oropharynx  clear  Lungs:  Clear throughout to auscultation.   No wheezes, crackles, or rhonchi. No acute distress. Heart:  Regular rate and rhythm; no murmurs, clicks, rubs,  or gallops. Abdomen: diffusely tender with guarding, soft, nondistended, +BS  Rectal:  Deferred Ext: no edema  GI:  Lab Results:  Recent Labs  09/20/16 0630 09/20/16 0726 09/21/16 0235 09/21/16 0825  WBC 16.1*  --  8.1 7.2  HGB 10.8* 15.0 7.0* 7.1*  HCT 31.9* 44.0 20.4* 20.5*  PLT 306  --  178 180   BMET  Recent Labs  09/20/16 0630 09/20/16 0726 09/21/16 0235  NA 139 140 140  K 3.5 3.4* 4.1  CL 108 108 114*  CO2 23  --  23  GLUCOSE 143* 132* 92  BUN 40* 39* 15  CREATININE 1.04* 1.00 0.82  CALCIUM 8.5*  --  7.9*    LFT  Recent Labs  09/20/16 0630  PROT 6.2*  ALBUMIN 3.3*  AST 14*  ALT 9*  ALKPHOS 59  BILITOT 0.4   PT/INR No results for input(s): LABPROT, INR in the last 72 hours.   Studies/Results: Ct Abdomen Pelvis W Contrast  Result Date: 09/20/2016 CLINICAL DATA:  Lower abdominal pain and pelvic pain. Nausea and vomiting for several days. EXAM: CT ABDOMEN AND PELVIS WITH CONTRAST TECHNIQUE: Multidetector CT imaging of the abdomen and pelvis was performed using the standard protocol following bolus administration of intravenous contrast. CONTRAST:  ISOVUE-300 IOPAMIDOL (ISOVUE-300) INJECTION 61% COMPARISON:  None FINDINGS: Lower chest: Lung bases are clear. Hepatobiliary: No focal hepatic lesion. Normal gallbladder. No biliary duct dilatation. Pancreas: Pancreas is normal. No ductal dilatation. No pancreatic inflammation. Spleen: Normal spleen Adrenals/urinary tract: Adrenal glands and kidneys are normal. The ureters and bladder normal. Stomach/Bowel: Stomach, small bowel, appendix, and cecum are normal. The colon and rectosigmoid colon are normal. Vascular/Lymphatic: Abdominal aorta is normal caliber with atherosclerotic calcification. There is no retroperitoneal or periportal lymphadenopathy. No pelvic lymphadenopathy. Reproductive: Post hysterectomy Other: No free fluid. Musculoskeletal: No aggressive osseous lesion. IMPRESSION: 1. No acute abdominopelvic findings. 2. Normal appendix. 3. Post hysterectomy. 4.  Atherosclerotic calcification of the aorta. Electronically Signed   By: Suzy Bouchard M.D.   On: 09/20/2016 11:35   Dg Chest Port 1 View  Result Date: 09/20/2016 CLINICAL DATA:  Cough. EXAM: PORTABLE CHEST 1 VIEW COMPARISON:  Thoracolumbar spine series 09/15/2009. FINDINGS: Mediastinum hilar structures are normal. Lungs are clear. Heart size normal. No pleural effusion or pneumothorax. No acute bony abnormality . IMPRESSION: No acute cardiopulmonary disease. Electronically Signed   By:  Marcello Moores  Register   On: 09/20/2016 06:58    Impression/Plan: 56 yo with a GI bleed concerning for a peptic ulcer bleed with melena in the setting of NSAID abuse and cocaine abuse. Continue Protonix 40 mg IV Q 12 hours. EGD needed this weekend but likely will need Propofol for sedation due to her drug abuse. Clear liquid diet. NPO p MN. Dr. Cristina Gong will re-evaluate tomorrow and decide on timing of EGD.    LOS: 1 day   [place] C.  09/21/2016, 3:08 PM  Pager [phone]  AFTER 5 pm or on weekends please call [phone]

## 2016-09-22 NOTE — Anesthesia Postprocedure Evaluation (Addendum)
Anesthesia Post Note  Patient: Kelli Jenkins, Kelli Jenkins  Procedure(s) Performed: Procedure(s) (LRB): ESOPHAGOGASTRODUODENOSCOPY (EGD) (N/A)     Patient location during evaluation: Endoscopy Anesthesia Type: MAC Level of consciousness: awake and alert Pain management: pain level controlled Vital Signs Assessment: post-procedure vital signs reviewed and stable Respiratory status: spontaneous breathing and respiratory function stable Cardiovascular status: stable Anesthetic complications: no    Last Vitals:  Vitals:   09/22/16 1300 09/22/16 1320  BP: (!) 83/48 (!) 97/48  Pulse:  65  Resp:  18  Temp:  36.4 C    Last Pain:  Vitals:   09/22/16 1320  TempSrc: Oral  PainSc:                  Dondrea Clendenin DANIEL

## 2016-09-22 NOTE — Progress Notes (Signed)
EGD shows no active bleeding or blood in stomach, but several active gastro-duodenal ulcers w/ surrounding erythema and edema, which would account for both the patient's melena and n/v prior to admission.  Recomm:  1. Have ordered clr liq diet; if tolerated, could advance to solid tonight or tomorrow 2. Have added sucralfate suspension to pt's regimen (just while she's in-house) to "patch" ulcers and possibly expedite healing 3. Would continue IV PPI until it's clear she is keeping down fluids, then could transition to PO 4. Due to severity of ulcers, would send pt home on bid PPI for one month--if she is going to continue exposure to NSAID's, I would thereafter continue qd PPI indefinitely for ulcer prophylaxis. 5. I will contact pt w/ bx results when available, and order Rx for H. Pylori if positive. 6. Dischg tomorrow may be possible if hgb stable (as expected) and pt tolerates food, or it may take an extra day or longer for pt to successfully transition to solid food 7. Pt needs careful instruction in ulcerogenic effects of NSAID's 8. F/u in GI office not needed unless H. Pylori is present in which case I will arrange f/u to provide Rx's and instructions 10. F/u egd not needed b/o location of ulcers means they are almost certainly benign. 11. Will sign off, call me prn.  Cleotis Nipper, M.D. Pager [phone] If no answer or after 5 PM call [phone]

## 2016-09-22 NOTE — Anesthesia Preprocedure Evaluation (Addendum)
Anesthesia Evaluation  Patient identified by MRN, date of birth, ID band Patient awake    Reviewed: Allergy & Precautions, NPO status , Patient's Chart, lab work & pertinent test results  History of Anesthesia Complications Negative for: history of anesthetic complications  Airway Mallampati: II  TM Distance: >3 FB Neck ROM: Full    Dental no notable dental hx. (+) Dental Advisory Given   Pulmonary Current Smoker,    Pulmonary exam normal        Cardiovascular negative cardio ROS Normal cardiovascular exam     Neuro/Psych PSYCHIATRIC DISORDERS Depression negative neurological ROS     GI/Hepatic negative GI ROS, Neg liver ROS,   Endo/Other  negative endocrine ROS  Renal/GU negative Renal ROS  negative genitourinary   Musculoskeletal negative musculoskeletal ROS (+)   Abdominal   Peds negative pediatric ROS (+)  Hematology negative hematology ROS (+)   Anesthesia Other Findings   Reproductive/Obstetrics negative OB ROS                            Anesthesia Physical Anesthesia Plan  ASA: III  Anesthesia Plan: MAC   Post-op Pain Management:    Induction:   Airway Management Planned: Natural Airway  Additional Equipment:   Intra-op Plan:   Post-operative Plan:   Informed Consent: I have reviewed the patients History and Physical, chart, labs and discussed the procedure including the risks, benefits and alternatives for the proposed anesthesia with the patient or authorized representative who has indicated his/her understanding and acceptance.   Dental advisory given  Plan Discussed with: CRNA and Anesthesiologist  Anesthesia Plan Comments:        Anesthesia Quick Evaluation

## 2016-09-22 NOTE — Progress Notes (Incomplete Revision)
PROGRESS NOTE    Kelli Jenkins   DJS:[id number]  DOB: Nov 18, 1960  DOA: 09/20/2016 PCP: Patient, No Pcp Per   Brief Narrative:  Kelli Jenkins is a 56 y.o. female with history of tobacco abuse, cocaine abuse, depression, and medical noncompliance who presents with nausea, vomiting and black stools. She states symptoms have been ongoing for about 3-4 days now. She has a h/o gastritis and has used PPIs in the past but she is not always able to afford them. For the past week she has been having upper abdominal pain and has been taking Alleve, about 6 tabs a day. No h/o EGD in the past. She was feeling lightheaded and having ongoing abdominal pain and therefore came into the ER.   Subjective: Still no further stool since being in the hospital. No vomiting.   Assessment & Plan:   Principal Problem:   Melena - black stool which is heme + - start PPI, make NPO and avoid NSAIDS - she claims she does not drink alcohol - GI has been consulted - no further episodes since coming into the hospital - EGD showing gastroduodenal ulcers with "pigmented material" suggestive of recent bleeding - f/u H pylori - clear liquids and PPI IV for now  Active Problems:   Anemia associated with acute blood loss - Hb 15 >> 7.1- the drop is partially due to hydration(was hemoconcentrated on admission) and partly due to acute blood loss - Hb in 5/18 was 10.8 - transfused 2 U PRBC on 6/1 - follow Hb- no longer having bleeding  Dehydration - improving - cont IVF  Chest pain - occurring during episodes of vomiting and likely due to esophageal pain rather than cardiac issues    Tobacco abuse  - Nicotine patch    Cocaine abuse - admits to snorting cocaine a few times a month   DVT prophylaxis: SCDs Code Status: Full code Family Communication:  Disposition Plan: follow on med surg Consultants:   GI Procedures:    Antimicrobials:  Anti-infectives (From admission, onward)   Start     Dose/Rate  Route Frequency Ordered Stop   09/20/16 1800  vancomycin (VANCOCIN) 1,250 mg in sodium chloride 0.9 % 250 mL IVPB  Status:  Discontinued     1,250 mg 166.7 mL/hr over 90 Minutes Intravenous Every 12 hours 09/20/16 1106 09/21/16 1004   09/20/16 1600  piperacillin-tazobactam (ZOSYN) IVPB 3.375 g  Status:  Discontinued     3.375 g 12.5 mL/hr over 240 Minutes Intravenous Every 8 hours 09/20/16 0715 09/21/16 1004   09/20/16 0630  piperacillin-tazobactam (ZOSYN) IVPB 3.375 g     3.375 g 100 mL/hr over 30 Minutes Intravenous  Once 09/20/16 0626 09/20/16 1500   09/20/16 0630  vancomycin (VANCOCIN) IVPB 1000 mg/200 mL premix     1,000 mg 200 mL/hr over 60 Minutes Intravenous  Once 09/20/16 0626 09/20/16 1501       Objective: Vitals:   09/22/16 1211 09/22/16 1250 09/22/16 1300 09/22/16 1320  BP: (!) 76/37 (!) 80/48 (!) 83/48 (!) 97/48  Pulse: 69 67  65  Resp: 15   18  Temp: 97.6 F (36.4 C)   97.5 F (36.4 C)  TempSrc: Oral   Oral  SpO2: 97%   100%  Weight:      Height:        Intake/Output Summary (Last 24 hours) at 09/22/16 1337 Last data filed at 09/22/16 1204  Gross per 24 hour  Intake  1875 ml  Output             3200 ml  Net            -1325 ml   Filed Weights   09/20/16 0827 09/22/16 1122  Weight: 91.6 kg (202 lb) 91.6 kg (202 lb)    Examination: General exam: Appears comfortable  HEENT: PERRLA, oral mucosa moist, no sclera icterus or thrush Respiratory system: Clear to auscultation. Respiratory effort normal. Cardiovascular system: S1 & S2 heard, RRR.  No murmurs  Gastrointestinal system: Abdomen soft, epigastric tenderness. nondistended. Normal bowel sound. No organomegaly Central nervous system: Alert and oriented. No focal neurological deficits. Extremities: No cyanosis, clubbing or edema Skin: No rashes or ulcers Psychiatry:  Mood & affect appropriate.     Data Reviewed: I have personally reviewed following labs and imaging  studies  CBC:  Recent Labs Lab 09/20/16 0630 09/20/16 0726 09/21/16 0235 09/21/16 0825 09/21/16 1953 09/22/16 0651  WBC 16.1*  --  8.1 7.2 8.3 8.3  NEUTROABS 12.1*  --   --   --   --   --   HGB 10.8* 15.0 7.0* 7.1* 9.2* 8.5*  HCT 31.9* 44.0 20.4* 20.5* 26.8* 24.5*  MCV 94.7  --  94.9 95.8 92.7 91.8  PLT 306  --  178 180 194 469   Basic Metabolic Panel:  Recent Labs Lab 09/20/16 0630 09/20/16 0726 09/21/16 0235 09/22/16 0651  NA 139 140 140 140  K 3.5 3.4* 4.1 3.9  CL 108 108 114* 108  CO2 23  --  23 27  GLUCOSE 143* 132* 92 91  BUN 40* 39* 15 13  CREATININE 1.04* 1.00 0.82 0.80  CALCIUM 8.5*  --  7.9* 8.4*   GFR: Estimated Creatinine Clearance: 91.9 mL/min (by C-G formula based on SCr of 0.8 mg/dL). Liver Function Tests:  Recent Labs Lab 09/20/16 0630  AST 14*  ALT 9*  ALKPHOS 59  BILITOT 0.4  PROT 6.2*  ALBUMIN 3.3*    Recent Labs Lab 09/20/16 0630  LIPASE 26   No results for input(s): AMMONIA in the last 168 hours. Coagulation Profile: No results for input(s): INR, PROTIME in the last 168 hours. Cardiac Enzymes:  Recent Labs Lab 09/20/16 0630 09/20/16 1506 09/20/16 2043 09/21/16 0235  CKTOTAL 21*  --   --   --   TROPONINI  --  <0.03 <0.03 <0.03   BNP (last 3 results) No results for input(s): PROBNP in the last 8760 hours. HbA1C:  Recent Labs  09/20/16 1506  HGBA1C 5.4   CBG: No results for input(s): GLUCAP in the last 168 hours. Lipid Profile: No results for input(s): CHOL, HDL, LDLCALC, TRIG, CHOLHDL, LDLDIRECT in the last 72 hours. Thyroid Function Tests: No results for input(s): TSH, T4TOTAL, FREET4, T3FREE, THYROIDAB in the last 72 hours. Anemia Panel: No results for input(s): VITAMINB12, FOLATE, FERRITIN, TIBC, IRON, RETICCTPCT in the last 72 hours. Urine analysis:    Component Value Date/Time   COLORURINE YELLOW 09/20/2016 1120   APPEARANCEUR HAZY (A) 09/20/2016 1120   LABSPEC 1.026 09/20/2016 1120   PHURINE 6.0  09/20/2016 1120   GLUCOSEU NEGATIVE 09/20/2016 1120   HGBUR NEGATIVE 09/20/2016 [place] 09/20/2016 1120   KETONESUR NEGATIVE 09/20/2016 1120   PROTEINUR NEGATIVE 09/20/2016 1120   NITRITE NEGATIVE 09/20/2016 1120   LEUKOCYTESUR TRACE (A) 09/20/2016 1120   Sepsis Labs: @LABRCNTIP (procalcitonin:4,lacticidven:4) ) Recent Results (from the past 240 hour(s))  Blood Culture (routine x 2)  Status: None (Preliminary result)   Collection Time: 09/20/16  6:39 AM  Result Value Ref Range Status   Specimen Description BLOOD RIGHT ANTECUBITAL  Final   Special Requests   Final    BOTTLES DRAWN AEROBIC AND ANAEROBIC Blood Culture adequate volume   Culture   Final    NO GROWTH 1 DAY Performed at [facility], [address] [place], [place] [address]    Report Status PENDING  Incomplete  Blood Culture (routine x 2)     Status: None (Preliminary result)   Collection Time: 09/20/16  7:14 AM  Result Value Ref Range Status   Specimen Description BLOOD LEFT ARM  Final   Special Requests   Final    BOTTLES DRAWN AEROBIC AND ANAEROBIC Blood Culture adequate volume   Culture   Final    NO GROWTH 1 DAY Performed at [facility], [place] [address], [place], [place] [address]    Report Status PENDING  Incomplete  Urine culture     Status: Abnormal   Collection Time: 09/20/16 11:20 AM  Result Value Ref Range Status   Specimen Description URINE, CLEAN CATCH  Final   Special Requests NONE  Final   Culture MULTIPLE SPECIES PRESENT, SUGGEST RECOLLECTION (A)  Final   Report Status 09/21/2016 FINAL  Final         Radiology Studies: No results found.    Scheduled Meds: . famotidine  20 mg Oral Daily  . nicotine  14 mg Transdermal Daily  . pantoprazole (PROTONIX) IV  40 mg Intravenous Q12H  . sodium chloride flush  3 mL Intravenous Q12H  . sucralfate  1 g Oral TID WC & HS   Continuous Infusions: . sodium chloride 125 mL/hr at 09/22/16 0746     LOS: 2 days     Time spent in minutes: 35    Debbe Odea, MD [facility] Pager: [email or web address] Password [email or web address] 09/22/2016, 1:37 PM

## 2016-09-22 NOTE — Op Note (Addendum)
[facility] Patient Name: Kelli Jenkins Procedure Date: 09/22/2016 MRN: [id number] Attending MD: Ronald Lobo , MD Date of Birth: Sep 20, 1960 CSN: [id number] Age: 56 Admit Type: Inpatient Procedure:                Upper GI endoscopy Indications:              Acute post hemorrhagic anemia, Melena Providers:                Ronald Lobo, MD, Laverta Baltimore RN, RN,                            William Dalton, Technician Referring MD:              Medicines:                Monitored Anesthesia Care Complications:            No immediate complications. Estimated Blood Loss:     Estimated blood loss was minimal. Procedure:                Pre-Anesthesia Assessment:                           - Prior to the procedure, a History and Physical                            was performed, and patient medications and                            allergies were reviewed. The patient's tolerance of                            previous anesthesia was also reviewed. The risks                            and benefits of the procedure and the sedation                            options and risks were discussed with the patient.                            All questions were answered, and informed consent                            was obtained. Prior Anticoagulants: The patient has                            taken no previous anticoagulant or antiplatelet                            agents. ASA Grade Assessment: III - A patient with                            severe systemic disease. After reviewing the risks  and benefits, the patient was deemed in                            satisfactory condition to undergo the procedure.                           After obtaining informed consent, the endoscope was                            passed under direct vision. Throughout the                            procedure, the patient's blood pressure, pulse, and       oxygen saturations were monitored continuously. The                            [id number] ([id number]) scope was introduced through the                            mouth, and advanced to the second part of duodenum.                            The upper GI endoscopy was accomplished without                            difficulty. The patient tolerated the procedure                            well. Scope In: Scope Out: Findings:      The examined esophagus was normal other than a minimal intermittent       hiatal hernia.      There is no endoscopic evidence of inflammation, varices or       Mallory-Weiss tear in the entire esophagus.      Three non-bleeding cratered gastro-duodenal ulcers with pigmented       material were found at the pylorus and proximal duodenal bulb. The       largest lesion was 18 mm in largest dimension. Biopsies were taken with       a cold forceps for histology from the antrum of the stomach to check for       H. pylori. Estimated blood loss was minimal. There was no adherent clot       or protruberant visible vessel, so no intervention was performed.      The exam of the stomach was otherwise normal. No blood or coffee ground       material was seen. There was a small amount of bile in the stomach.      The cardia and gastric fundus were normal on retroflexion.      The second portion of the duodenum was normal. Impression:               - Normal esophagus.                           - Non-bleeding gastro-duodenal ulcers with  pigmented material suggesting they are the source                            of the patient's recent bleeding. Stomach biopsied                            for H. pylori.                           - Normal second portion of the duodenum. Moderate Sedation:      This patient was sedated with monitored anesthesia care, not moderate       sedation. Recommendation:           - Await pathology results.                            - Continue present medications.                           - Use sucralfate suspension 1 gram PO QID while                            in-house.                           - Put patient on a clear liquid diet starting today. Procedure Code(s):        --- Professional ---                           [phone], Esophagogastroduodenoscopy, flexible,                            transoral; with biopsy, single or multiple Diagnosis Code(s):        --- Professional ---                           K25.9, Gastric ulcer, unspecified as acute or                            chronic, without hemorrhage or perforation                           D62, Acute posthemorrhagic anemia                           K92.1, Melena (includes Hematochezia) CPT copyright 2016 American [employer] Association. All rights reserved. The codes documented in this report are preliminary and upon coder review may  be revised to meet current compliance requirements. Ronald Lobo, MD 09/22/2016 12:23:08 PM This report has been signed electronically. Number of Addenda: 0

## 2016-09-22 NOTE — Transfer of Care (Addendum)
Immediate Anesthesia Transfer of Care Note  Patient: Kelli Jenkins  Procedure(s) Performed: Procedure(s): ESOPHAGOGASTRODUODENOSCOPY (EGD) (N/A)  Patient Location: PACU  Anesthesia Type:MAC  Level of Consciousness:  sedated, patient cooperative and responds to stimulation  Airway & Oxygen Therapy:Patient Spontanous Breathing and Patient connected to face mask oxgen  Post-op Assessment:  Report given to PACU RN and Post -op Vital signs reviewed and stable  Post vital signs:  Reviewed and stable  Last Vitals:  Vitals:   09/22/16 0605 09/22/16 1122  BP: (!) 88/52 (!) 76/42  Pulse: 64 66  Resp: 16 12  Temp: 36.7 C 65.5 C    Complications: No apparent anesthesia complications

## 2016-09-22 NOTE — Interval H&P Note (Addendum)
History and Physical Interval Note:  09/22/2016 11:52 AM  Kelli Jenkins  has presented today for surgery, with the diagnosis of Melena  The various methods of treatment have been discussed with the patient and family. After consideration of risks, benefits and other options for treatment, the patient has consented to  Procedure(s): ESOPHAGOGASTRODUODENOSCOPY (EGD) (N/A) as a surgical intervention .  The patient's history has been reviewed, patient examined, no change in status, stable for surgery.  I have reviewed the patient's chart and labs.  Questions were answered to the patient's satisfaction.     Cleotis Nipper

## 2016-09-22 NOTE — Progress Notes (Signed)
Addendum to previous note:   Pt doesn't have a current phone number, so I gave her my name and office number and asked her to call next week for bx result and also to consider having an office visit to discuss colon cancer screening options (she's never had a colonoscopy).  Cleotis Nipper, M.D. Pager [phone] If no answer or after 5 PM call [phone]

## 2016-09-22 NOTE — Progress Notes (Signed)
Pt tentatively scheduled for egd for approx 11 a.m today (subject to change).  Spoke w/ pt on phone and reviewed nature/purpose/risks of egd and she remains agreeable.  Pt is clinically stable, reporting no BM's or hematemesis overnight.  BP is a bit soft but no tachycardia.  Appropriate post-Tx rise in hgb w/ slt drop overnight c/w equilibration (BUN nl).  Cleotis Nipper, M.D. Pager [phone] If no answer or after 5 PM call [phone]

## 2016-09-22 NOTE — Progress Notes (Signed)
Pt reports that during the time EMS was at her apt to bring her to the ER on 5/31 she remembers telling them she was too weak & dizzy to walk but they made her walk from apartment to the ambulance and thinks she nearly passed out during that time and now remembers her rt foot being up under her that's  why she has the abrasions to the top of her Rt foot & Rt knee and is wondering if that has something to do with why her Rt shoulder hurts so bad.

## 2016-09-23 ENCOUNTER — Inpatient Hospital Stay (HOSPITAL_COMMUNITY): Payer: Self-pay

## 2016-09-23 DIAGNOSIS — K273 Acute peptic ulcer, site unspecified, without hemorrhage or perforation: Secondary | ICD-10-CM

## 2016-09-23 DIAGNOSIS — Z9119 Patient's noncompliance with other medical treatment and regimen: Secondary | ICD-10-CM

## 2016-09-23 LAB — CBC
HCT: 24.1 % — ABNORMAL LOW (ref 36.0–46.0)
HEMOGLOBIN: 8.2 g/dL — AB (ref 12.0–15.0)
MCH: 32 pg (ref 26.0–34.0)
MCHC: 34 g/dL (ref 30.0–36.0)
MCV: 94.1 fL (ref 78.0–100.0)
Platelets: 200 10*3/uL (ref 150–400)
RBC: 2.56 MIL/uL — AB (ref 3.87–5.11)
RDW: 15.3 % (ref 11.5–15.5)
WBC: 8 10*3/uL (ref 4.0–10.5)

## 2016-09-23 LAB — BASIC METABOLIC PANEL
Anion gap: 3 — ABNORMAL LOW (ref 5–15)
BUN: 10 mg/dL (ref 6–20)
CHLORIDE: 109 mmol/L (ref 101–111)
CO2: 27 mmol/L (ref 22–32)
Calcium: 8.3 mg/dL — ABNORMAL LOW (ref 8.9–10.3)
Creatinine, Ser: 0.84 mg/dL (ref 0.44–1.00)
GFR calc Af Amer: >60 mL/min (ref >60)
GFR calc non Af Amer: >60 mL/min (ref >60)
GLUCOSE: 108 mg/dL — AB (ref 65–99)
POTASSIUM: 4.1 mmol/L (ref 3.5–5.1)
Sodium: 139 mmol/L (ref 135–145)

## 2016-09-23 LAB — CULTURE, BLOOD (ROUTINE X 2)
Culture: NO GROWTH
Culture: NO GROWTH
SPECIAL REQUESTS: ADEQUATE
Special Requests: ADEQUATE

## 2016-09-23 MED ORDER — DICLOFENAC SODIUM 1 % TD GEL
2.0000 g | Freq: Four times a day (QID) | TRANSDERMAL | Status: DC
  Administered 2016-09-23: 13:00:00 2 g via TOPICAL
  Filled 2016-09-23: qty 100

## 2016-09-23 MED ORDER — SUCRALFATE 1 GM/10ML PO SUSP: 1.0000 g | Freq: Three times a day (TID) | ORAL | 0 refills | Status: DC

## 2016-09-23 MED ORDER — PANTOPRAZOLE SODIUM 40 MG PO TBEC: 40.0000 mg | DELAYED_RELEASE_TABLET | Freq: Two times a day (BID) | ORAL | 2 refills | Status: DC

## 2016-09-23 NOTE — Discharge Summary (Incomplete Revision)
Physician Discharge Summary  BRYNN MULGREW JSR:[id number] DOB: 12/25/1960 DOA: 09/20/2016  PCP: Patient, No Pcp Per  Admit date: 09/20/2016 Discharge date: 09/23/2016  Admitted From: home  Disposition:  home   Recommendations for Outpatient Follow-up:  1. Check Hb in 1-2 months to ensure it is improving  Discharge Condition:  stable   CODE STATUS:  Full code   Consultations:  GI    Discharge Diagnoses:  Principal Problem:   Melena Active Problems:   Anemia associated with acute blood loss   Acute peptic ulcer   Noncompliance   Tobacco abuse   Nausea and vomiting   Cocaine abuse    Subjective: No stool since prior to admission. No abdominal pain and no vomiting.   Brief Summary: Paizlie Klaus a 56 y.o.femalewith history of tobacco abuse, cocaine abuse, depression, and medical noncompliance who presents with nausea, vomiting and black stools. She states symptoms have been ongoing for about 3-4 days now. She has a h/o gastritis and has used PPIs in the past but she is not always able to afford them. For the past week she has been having upper abdominal pain and has been taking Alleve, about 6 tabs a day. No h/o EGD in the past. She was feeling lightheaded and having ongoing abdominal pain and therefore came into the ER.   Hospital Course:  Principal Problem:   Melena- PUD due to NSAIDs - having black stool which is heme + - 6/1- start PPI, make NPO and avoid NSAIDS - GI  consulted - she claims she does not drink alcohol- - no further episodes since coming into the hospital - EGD showing gastroduodenal ulcers with "pigmented material" suggestive of recent bleeding - she has not had any bleeding since being admitted to the hospital- -will need to  f/u biopsies for H pylori- GI will contact her with results - GI recommendations: PPI BID for 1 month- she is also on Carafate- can transition to daily PPI in 1 month  Active Problems:   Anemia associated with acute blood  loss - Hb 15 >> 7.1- the drop is partially due to hydration(was hemoconcentrated on admission) and partly due to acute blood loss - Hb in 5/18 was 10.8 - transfused 2 U PRBC on 6/1 bringing Hb up to 8- it has been stable since as bleeding has resolved   Dehydration - improving with IVF  Chest pain - occurring during episodes of vomiting and likely due to esophageal pain rather than cardiac issues  Complaint of right shoulder pain -  after a fall while getting into an ambulance on the way to the hospital - Xrays unrevealing- Voltaren get to be used for muscle     Tobacco abuse  - Nicotine patch    Cocaine abuse - admits to snorting cocaine a few times a month- has been counseled    Discharge Instructions  Discharge Instructions    Diet - low sodium heart healthy    Complete by:  As directed    Increase activity slowly    Complete by:  As directed      Allergies as of 09/23/2016      Reactions   Darvocet [propoxyphene N-acetaminophen] Swelling   OK to take acetaminophen   Ibuprofen Swelling   Ok to take ketorolac   Tramadol Swelling      Medication List    Notice   Your medication list is not available because the admission was too long ago.    Follow-up Information  [facility] Follow up.   Why:  please call to arrange an appointment Contact information: [place] [phone]         Allergies  Allergen Reactions  . Darvocet [Propoxyphene N-Acetaminophen] Swelling    OK to take acetaminophen  . Ibuprofen Swelling    Ok to take ketorolac  . Tramadol Swelling     Procedures/Studies:    Dg Shoulder Right  Result Date: 09/23/2016 CLINICAL DATA:  [place] getting into ambulance yesterday. Shoulder pain. EXAM: RIGHT SHOULDER - 2+ VIEW COMPARISON:  None. FINDINGS: There is no evidence of fracture or dislocation. There is no evidence of arthropathy or other focal bone  abnormality. Soft tissues are unremarkable. IMPRESSION: Negative. Electronically Signed   By: Nelson Chimes M.D.   On: 09/23/2016 11:32   Ct Abdomen Pelvis W Contrast  Result Date: 09/20/2016 CLINICAL DATA:  Lower abdominal pain and pelvic pain. Nausea and vomiting for several days. EXAM: CT ABDOMEN AND PELVIS WITH CONTRAST TECHNIQUE: Multidetector CT imaging of the abdomen and pelvis was performed using the standard protocol following bolus administration of intravenous contrast. CONTRAST:  ISOVUE-300 IOPAMIDOL (ISOVUE-300) INJECTION 61% COMPARISON:  None FINDINGS: Lower chest: Lung bases are clear. Hepatobiliary: No focal hepatic lesion. Normal gallbladder. No biliary duct dilatation. Pancreas: Pancreas is normal. No ductal dilatation. No pancreatic inflammation. Spleen: Normal spleen Adrenals/urinary tract: Adrenal glands and kidneys are normal. The ureters and bladder normal. Stomach/Bowel: Stomach, small bowel, appendix, and cecum are normal. The colon and rectosigmoid colon are normal. Vascular/Lymphatic: Abdominal aorta is normal caliber with atherosclerotic calcification. There is no retroperitoneal or periportal lymphadenopathy. No pelvic lymphadenopathy. Reproductive: Post hysterectomy Other: No free fluid. Musculoskeletal: No aggressive osseous lesion. IMPRESSION: 1. No acute abdominopelvic findings. 2. Normal appendix. 3. Post hysterectomy. 4.  Atherosclerotic calcification of the aorta. Electronically Signed   By: Suzy Bouchard M.D.   On: 09/20/2016 11:35   Dg Chest Port 1 View  Result Date: 09/20/2016 CLINICAL DATA:  Cough. EXAM: PORTABLE CHEST 1 VIEW COMPARISON:  Thoracolumbar spine series 09/15/2009. FINDINGS: Mediastinum hilar structures are normal. Lungs are clear. Heart size normal. No pleural effusion or pneumothorax. No acute bony abnormality . IMPRESSION: No acute cardiopulmonary disease. Electronically Signed   By: Marcello Moores  Register   On: 09/20/2016 06:58   Dg Humerus  Right  Result Date: 09/23/2016 CLINICAL DATA:  [place] getting and ambulance yesterday. Upper arm pain. EXAM: RIGHT HUMERUS - 2+ VIEW COMPARISON:  None. FINDINGS: There is no evidence of fracture or other focal bone lesions. Soft tissues are unremarkable. IMPRESSION: Negative. Electronically Signed   By: Nelson Chimes M.D.   On: 09/23/2016 11:33       Discharge Exam: Vitals:   09/22/16 2023 09/23/16 0500  BP: (!) 73/33 (!) 100/38  Pulse: 65 71  Resp: 18 16  Temp: 97.7 F (36.5 C) 98.7 F (37.1 C)   Vitals:   09/22/16 1430 09/22/16 1555 09/22/16 2023 09/23/16 0500  BP: (!) 88/45 (!) 86/48 (!) 73/33 (!) 100/38  Pulse:  71 65 71  Resp:  18 18 16   Temp:  97.8 F (36.6 C) 97.7 F (36.5 C) 98.7 F (37.1 C)  TempSrc:  Oral Oral Oral  SpO2:  98% 95% 96%  Weight:      Height:        General: Pt is alert, awake, not in acute distress Cardiovascular: RRR, S1/S2 +, no rubs, no gallops Respiratory: CTA bilaterally, no wheezing, no rhonchi  Abdominal: Soft, NT, ND, bowel sounds + Extremities: no edema, no cyanosis    The results of significant diagnostics from this hospitalization (including imaging, microbiology, ancillary and laboratory) are listed below for reference.     Microbiology: Recent Results (from the past 240 hour(s))  Blood Culture (routine x 2)     Status: None (Preliminary result)   Collection Time: 09/20/16  6:39 AM  Result Value Ref Range Status   Specimen Description BLOOD RIGHT ANTECUBITAL  Final   Special Requests   Final    BOTTLES DRAWN AEROBIC AND ANAEROBIC Blood Culture adequate volume   Culture   Final    NO GROWTH 2 DAYS Performed at [facility], [address], [place], [place] [address]    Report Status PENDING  Incomplete  Blood Culture (routine x 2)     Status: None (Preliminary result)   Collection Time: 09/20/16  7:14 AM  Result Value Ref Range Status   Specimen Description BLOOD LEFT ARM  Final   Special Requests   Final    BOTTLES  DRAWN AEROBIC AND ANAEROBIC Blood Culture adequate volume   Culture   Final    NO GROWTH 2 DAYS Performed at [facility], [address] [place], [place] [address]    Report Status PENDING  Incomplete  Urine culture     Status: Abnormal   Collection Time: 09/20/16 11:20 AM  Result Value Ref Range Status   Specimen Description URINE, CLEAN CATCH  Final   Special Requests NONE  Final   Culture MULTIPLE SPECIES PRESENT, SUGGEST RECOLLECTION (A)  Final   Report Status 09/21/2016 FINAL  Final     Labs: BNP (last 3 results) No results for input(s): BNP in the last 8760 hours. Basic Metabolic Panel:  Recent Labs Lab 09/20/16 0630 09/20/16 0726 09/21/16 0235 09/22/16 0651 09/23/16 0617  NA 139 140 140 140 139  K 3.5 3.4* 4.1 3.9 4.1  CL 108 108 114* 108 109  CO2 23  --  23 27 27   GLUCOSE 143* 132* 92 91 108*  BUN 40* 39* 15 13 10   CREATININE 1.04* 1.00 0.82 0.80 0.84  CALCIUM 8.5*  --  7.9* 8.4* 8.3*   Liver Function Tests:  Recent Labs Lab 09/20/16 0630  AST 14*  ALT 9*  ALKPHOS 59  BILITOT 0.4  PROT 6.2*  ALBUMIN 3.3*    Recent Labs Lab 09/20/16 0630  LIPASE 26   No results for input(s): AMMONIA in the last 168 hours. CBC:  Recent Labs Lab 09/20/16 0630  09/21/16 0235 09/21/16 0825 09/21/16 1953 09/22/16 0651 09/23/16 0617  WBC 16.1*  --  8.1 7.2 8.3 8.3 8.0  NEUTROABS 12.1*  --   --   --   --   --   --   HGB 10.8*  < > 7.0* 7.1* 9.2* 8.5* 8.2*  HCT 31.9*  < > 20.4* 20.5* 26.8* 24.5* 24.1*  MCV 94.7  --  94.9 95.8 92.7 91.8 94.1  PLT 306  --  178 180 194 186 200  < > = values in this interval not displayed. Cardiac Enzymes:  Recent Labs Lab 09/20/16 0630 09/20/16 1506 09/20/16 2043 09/21/16 0235  CKTOTAL 21*  --   --   --   TROPONINI  --  <0.03 <0.03 <0.03   BNP: Invalid input(s): POCBNP CBG: No results for input(s): GLUCAP in the last 168 hours. D-Dimer No results for input(s): DDIMER in the last 72 hours. Hgb A1c  Recent  Labs  09/20/16 1506  HGBA1C 5.4   Lipid Profile No results for input(s): CHOL, HDL, LDLCALC, TRIG, CHOLHDL, LDLDIRECT in the last 72 hours. Thyroid function studies No results for input(s): TSH, T4TOTAL, T3FREE, THYROIDAB in the last 72 hours.  Invalid input(s): FREET3 Anemia work up No results for input(s): VITAMINB12, FOLATE, FERRITIN, TIBC, IRON, RETICCTPCT in the last 72 hours. Urinalysis    Component Value Date/Time   COLORURINE YELLOW 09/20/2016 1120   APPEARANCEUR HAZY (A) 09/20/2016 1120   LABSPEC 1.026 09/20/2016 1120   PHURINE 6.0 09/20/2016 1120   GLUCOSEU NEGATIVE 09/20/2016 1120   HGBUR NEGATIVE 09/20/2016 [place] 09/20/2016 1120   KETONESUR NEGATIVE 09/20/2016 1120   PROTEINUR NEGATIVE 09/20/2016 1120   NITRITE NEGATIVE 09/20/2016 1120   LEUKOCYTESUR TRACE (A) 09/20/2016 1120   Sepsis Labs Invalid input(s): PROCALCITONIN,  WBC,  LACTICIDVEN Microbiology Recent Results (from the past 240 hour(s))  Blood Culture (routine x 2)     Status: None (Preliminary result)   Collection Time: 09/20/16  6:39 AM  Result Value Ref Range Status   Specimen Description BLOOD RIGHT ANTECUBITAL  Final   Special Requests   Final    BOTTLES DRAWN AEROBIC AND ANAEROBIC Blood Culture adequate volume   Culture   Final    NO GROWTH 2 DAYS Performed at [facility], [address] [place], [place] [address]    Report Status PENDING  Incomplete  Blood Culture (routine x 2)     Status: None (Preliminary result)   Collection Time: 09/20/16  7:14 AM  Result Value Ref Range Status   Specimen Description BLOOD LEFT ARM  Final   Special Requests   Final    BOTTLES DRAWN AEROBIC AND ANAEROBIC Blood Culture adequate volume   Culture   Final    NO GROWTH 2 DAYS Performed at [facility], [address] [place], [place] [address]    Report Status PENDING  Incomplete  Urine culture     Status: Abnormal   Collection Time: 09/20/16 11:20 AM  Result Value  Ref Range Status   Specimen Description URINE, CLEAN CATCH  Final   Special Requests NONE  Final   Culture MULTIPLE SPECIES PRESENT, SUGGEST RECOLLECTION (A)  Final   Report Status 09/21/2016 FINAL  Final     Time coordinating discharge: Over 30 minutes  SIGNED:   Debbe Odea, MD  [facility] 09/23/2016, 2:31 PM Pager   If 7PM-7AM, please contact night-coverage [email or web address] Password [email or web address]

## 2016-09-23 NOTE — Discharge Instructions (Signed)
For the next 2 days you should have a liquid then. Then progress to a soft diet. Take a bland diet low in caffeine and spices.  AVOID: Motrin, Ibuprofen, Alleve, Naprosyn, Aspirin Stop all drug use. Please take all your medications with you for your next visit with your Primary MD. Please request your Primary MD to go over all hospital test results at the follow up. Please ask your Primary MD to get all Hospital records sent to his/her office.  If you experience worsening of your admission symptoms, develop shortness of breath, chest pain, suicidal or homicidal thoughts or a life threatening emergency, you must seek medical attention immediately by calling 911 or calling your MD.  Dennis Bast must read the complete instructions/literature along with all the possible adverse reactions/side effects for all the medicines you take including new medications that have been prescribed to you. Take new medicines after you have completely understood and accpet all the possible adverse reactions/side effects.   Do not drive when taking pain medications or sedatives.    Do not take more than prescribed Pain, Sleep and Anxiety Medications  If you have smoked or chewed Tobacco in the last 2 yrs please stop. Stop any regular alcohol and or recreational drug use.  Wear Seat belts while driving.

## 2016-09-23 NOTE — Progress Notes (Signed)
Tolerating clear liquids. Hemoglobin stable. BUN normal.  Endoscopic findings again reviewed, along with need to avoid nonsteroidals in the future.  Case discussed with Dr. Wynelle Cleveland. I think discharge today is safe from a GI bleeding standpoint, and is reasonable as long as she tolerates a full liquid diet.  The patient has my phone number and was again encouraged to contact our office to arrange some form of colon cancer screening, which could be discussed at an office visit.  Cleotis Nipper, M.D. Pager [phone] If no answer or after 5 PM call [phone]

## 2016-09-23 NOTE — Care Management Note (Addendum)
Case Management Note  Patient Details  Name: Kelli Jenkins MRN: [id number] Date of Birth: 08-Oct-1960  Subjective/Objective:    Melena, anemia, dehydration                Action/Plan: Discharge Planning: NCM spoke to pt and states she currently is without a place to live. She was staying with relatives. States she is not working. Provided pt with brochure for [facility] to call and make appt. Provided pt with MATCH with a $3 copay for meds and can utilize once per year. Contacted CSW for transportation and possible homelessness.   Expected Discharge Date:              Expected Discharge Plan:  Home/Self Care  In-House Referral:  Clinical Social Work  Discharge planning Services  CM Consult, [facility], Medication Assistance  Post Acute Care Choice:  NA Choice offered to:  NA  DME Arranged:  N/A DME Agency:  NA  HH Arranged:  NA HH Agency:  NA  Status of Service:  Completed, signed off  If discussed at [employer] of Stay Meetings, dates discussed:    Additional Comments:  Erenest Rasher, RN 09/23/2016, 1:32 PM

## 2016-09-23 NOTE — Progress Notes (Signed)
Discharge instructions and prescriptions reviewed with patient utilizing teach back method.

## 2016-09-23 NOTE — Discharge Summary (Signed)
Please cosign corrected note in order to complete chart correction case 

## 2016-09-23 NOTE — Progress Notes (Signed)
Met with pt at bedside to address report of transportation issues and homelessness. Pt reports she separated from husband very recently and is staying at her aunt's home. Due to separation does not have access to her things at home including her phone, therefore cannot call or get contact numbers for aunt to pick her up from the hospital. CSW provided bus passes and pt states she is comfortable returning home that way.  Pt advised she plans to follow up with DSS as she plans to apply for both disability and MCD.   Pt thanked CSW and denies any other current needs.   Sharren Bridge, MSW, LCSW Clinical Social Work 09/23/2016 [phone]

## 2016-09-24 ENCOUNTER — Encounter (HOSPITAL_COMMUNITY): Payer: Self-pay | Admitting: Gastroenterology

## 2016-09-24 LAB — BPAM RBC
Blood Product Expiration Date: 201806202359
Blood Product Expiration Date: 201806202359
ISSUE DATE / TIME: 201806011200
ISSUE DATE / TIME: 201806011427
UNIT TYPE AND RH: 6200
Unit Type and Rh: 6200

## 2016-09-24 LAB — CULTURE, BLOOD (ROUTINE X 2)
CULTURE: NO GROWTH
Culture: NO GROWTH
Culture: NO GROWTH
Culture: NO GROWTH
Culture: NO GROWTH
SPECIAL REQUESTS: ADEQUATE
SPECIAL REQUESTS: ADEQUATE
SPECIAL REQUESTS: ADEQUATE
SPECIAL REQUESTS: ADEQUATE
Special Requests: ADEQUATE

## 2016-09-24 LAB — TYPE AND SCREEN
ABO/RH(D): AB POS
ANTIBODY SCREEN: NEGATIVE
Unit division: 0
Unit division: 0

## 2016-09-25 LAB — CULTURE, BLOOD (ROUTINE X 2)
CULTURE: NO GROWTH
CULTURE: NO GROWTH
Special Requests: ADEQUATE
Special Requests: ADEQUATE

## 2016-10-09 NOTE — Addendum Note (Signed)
Addendum  created 10/09/16 1840 by Duane Boston, MD   Sign clinical note

## 2018-01-11 ENCOUNTER — Other Ambulatory Visit: Payer: Self-pay

## 2018-01-11 ENCOUNTER — Emergency Department (HOSPITAL_COMMUNITY)
Admission: EM | Admit: 2018-01-11 | Discharge: 2018-01-11 | Disposition: A | Discharge status: Home / Self Care | Payer: No Typology Code available for payment source | Attending: Emergency Medicine | Admitting: Emergency Medicine

## 2018-01-11 ENCOUNTER — Encounter (HOSPITAL_COMMUNITY): Payer: Self-pay | Admitting: Emergency Medicine

## 2018-01-11 DIAGNOSIS — F17210 Nicotine dependence, cigarettes, uncomplicated: Secondary | ICD-10-CM | POA: Insufficient documentation

## 2018-01-11 DIAGNOSIS — Z79899 Other long term (current) drug therapy: Secondary | ICD-10-CM | POA: Insufficient documentation

## 2018-01-11 DIAGNOSIS — M2550 Pain in unspecified joint: Secondary | ICD-10-CM | POA: Insufficient documentation

## 2018-01-11 MED ORDER — KETOROLAC TROMETHAMINE 15 MG/ML IJ SOLN
15.0000 mg | Freq: Once | INTRAMUSCULAR | Status: AC
  Administered 2018-01-11: 15 mg via INTRAMUSCULAR
  Filled 2018-01-11: qty 1

## 2018-01-11 MED ORDER — MELOXICAM 7.5 MG PO TABS: 7.5000 mg | ORAL_TABLET | Freq: Every day | ORAL | 0 refills | Status: AC

## 2018-01-11 MED ORDER — METHOCARBAMOL 500 MG PO TABS: 500.0000 mg | ORAL_TABLET | Freq: Two times a day (BID) | ORAL | 0 refills | Status: DC

## 2018-01-11 NOTE — ED Notes (Signed)
Pt reports she takes Ibuprofen at home with no untoward symptoms.

## 2018-01-11 NOTE — ED Provider Notes (Signed)
[facility] EMERGENCY DEPARTMENT Provider Note   CSN: [id number] Arrival date & time: 01/11/18  1347     History   Chief Complaint Chief Complaint  Patient presents with  . Back Pain  . Arm Pain  . Neck Pain    HPI Kelli Jenkins is a 57 y.o. female.  57 year old female presents with complaint of pain in her neck, shoulders, diffuse back for the past 2 weeks.  Patient also reports a tingling sensation in both of her arms and her fingertips up to her elbows.  Patient denies fevers, falls, injuries, weakness.  Patient has been taking Motrin and Tylenol without any relief of her symptoms.  It is worse with movement.  No other complaints or concerns.     Past Medical History:  Diagnosis Date  . Cancer ([place])    uterine  . Cocaine addiction ([place])   . Hypokalemia   . Nephrolithiasis   . Pyelonephritis   . SIRS (systemic inflammatory response syndrome) ([facility])   . Tobacco abuse   . UTI (urinary tract infection) 06/2013    Patient Active Problem List   Diagnosis Date Noted  . Chest pain 06/08/2016  . Nausea & vomiting 06/08/2016  . Intractable vomiting with nausea   . CAP (community acquired pneumonia) 05/03/2016  . Normocytic anemia 05/03/2016  . Urinary tract infection without hematuria   . Opioid overdose ([place]) 01/21/2016  . Somnolence 01/21/2016  . Cocaine abuse ([place]) 01/21/2016  . Opioid abuse ([place]) 01/21/2016  . UTI (urinary tract infection) 07/13/2013  . HCAP (healthcare-associated pneumonia) 08/16/2011  . Migraine 08/15/2011  . SIRS (systemic inflammatory response syndrome) ([place]) 08/13/2011  . Pyelonephritis 08/12/2011  . Ureteral colic 40/97/3532  . Hypotension 08/12/2011  . Fever 08/12/2011  . Nephrolithiasis 08/12/2011  . Leukocytosis 08/12/2011  . Tobacco use disorder 08/12/2011    Past Surgical History:  Procedure Laterality Date  . ESOPHAGOGASTRODUODENOSCOPY N/A 09/22/2016   Procedure: ESOPHAGOGASTRODUODENOSCOPY (EGD);  Surgeon:  Ronald Lobo, MD;  Location: [facility];  Service: Endoscopy;  Laterality: N/A;  . RENAL ARTERY STENT     stones  . TUBAL LIGATION    . VAGINAL HYSTERECTOMY       OB History   None      Home Medications    Prior to Admission medications   Medication Sig Start Date End Date Taking? Authorizing Provider  meloxicam (MOBIC) 7.5 MG tablet Take 1 tablet (7.5 mg total) by mouth daily for 10 days. 01/11/18 01/21/18  Tacy Learn, PA-C  methocarbamol (ROBAXIN) 500 MG tablet Take 1 tablet (500 mg total) by mouth 2 (two) times daily. 01/11/18   Tacy Learn, PA-C  pantoprazole (PROTONIX) 40 MG tablet Take 1 tablet (40 mg total) by mouth daily. 06/10/16   Alphonzo Grieve, MD  promethazine (PHENERGAN) 12.5 MG tablet Take 1 tablet (12.5 mg total) by mouth every 6 (six) hours as needed for nausea or vomiting. 06/09/16   Alphonzo Grieve, MD    Family History No family history on file.  Social History Social History   Tobacco Use  . Smoking status: Current Every Day Smoker    Packs/day: 1.00    Types: Cigarettes  . Smokeless tobacco: Never Used  Substance Use Topics  . Alcohol use: Yes    Comment: occasionally  . Drug use: No    Comment: HISTORY OF COCAINE USE     Allergies   Darvocet [propoxyphene n-acetaminophen]; Ibuprofen; and Tramadol   Review of Systems Review of Systems  Constitutional: Negative for chills and fever.  Gastrointestinal: Negative for abdominal pain.  Genitourinary: Negative for difficulty urinating.  Musculoskeletal: Positive for arthralgias and myalgias. Negative for joint swelling.  Skin: Negative for rash and wound.  Allergic/Immunologic: Negative for immunocompromised state.  Neurological: Negative for weakness.  Hematological: Does not bruise/bleed easily.  Psychiatric/Behavioral: Negative for confusion.  All other systems reviewed and are negative.    Physical Exam Updated Vital Signs BP 112/71 (BP Location: Right Arm)   Pulse 60    Temp 97.6 F (36.4 C) (Oral)   Resp 19   Ht 5\' 8"  (1.727 m)   Wt 92.5 kg   SpO2 95%   BMI 31.02 kg/m   Physical Exam  Constitutional: She is oriented to person, place, and time. She appears well-developed and well-nourished. No distress.  HENT:  Head: Normocephalic and atraumatic.  Cardiovascular: Intact distal pulses.  Pulmonary/Chest: Effort normal.  Musculoskeletal: She exhibits tenderness. She exhibits no deformity.       Back:  Neurological: She is alert and oriented to person, place, and time. She has normal strength. She displays normal reflexes. No sensory deficit. GCS eye subscore is 4. GCS verbal subscore is 5. GCS motor subscore is 6.  Reflex Scores:      Bicep reflexes are 2+ on the right side and 2+ on the left side.      Brachioradialis reflexes are 1+ on the right side and 1+ on the left side. Skin: Skin is warm and dry. No rash noted. She is not diaphoretic. No erythema.  Psychiatric: She has a normal mood and affect. Her behavior is normal.  Nursing note and vitals reviewed.    ED Treatments / Results  Labs (all labs ordered are listed, but only abnormal results are displayed) Labs Reviewed - No data to display  EKG None  Radiology No results found.  Procedures Procedures (including critical care time)  Medications Ordered in ED Medications  ketorolac (TORADOL) 15 MG/ML injection 15 mg (15 mg Intramuscular Given 01/11/18 1628)     Initial Impression / Assessment and Plan / ED Course  I have reviewed the triage vital signs and the nursing notes.  Pertinent labs & imaging results that were available during my care of the patient were reviewed by me and considered in my medical decision making (see chart for details).  Clinical Course as of Jan 11 1645  Sat Jan 11, 5078  4152 57 year old female presents with complaint of pain in her neck and back as well as shoulders, constant x2 weeks.  Also reports a tingling sensation from her fingertips to her  elbows.  She has been taking Motrin and Tylenol without relief, denies falls or injuries or weakness.  Patient is sleeping, easily arouses to verbal stimuli diffusely tender through the neck and back, normal grip strength and reflexes.  Patient admits to starting a new job in the past 2 weeks where she works in [employer].  Her soreness may be due to use of the muscles or different line of work that she is previously been exposed to.  Patient was given meloxicam and Robaxin as well as PCP referral.   [LM]    Clinical Course User Index [LM] Tacy Learn, PA-C   Final Clinical Impressions(s) / ED Diagnoses   Final diagnoses:  Arthralgia, unspecified joint    ED Discharge Orders         Ordered    methocarbamol (ROBAXIN) 500 MG tablet  2 times daily  01/11/18 1614    meloxicam (MOBIC) 7.5 MG tablet  Daily     01/11/18 1614           Tacy Learn, PA-C 01/11/18 1646    Orpah Greek, MD 01/12/18 [phone]

## 2018-01-11 NOTE — Discharge Instructions (Addendum)
Take Robaxin and meloxicam as prescribed for pain.  Not drive while taking Robaxin. Follow-up with a primary care provider, referral given.

## 2018-01-11 NOTE — ED Triage Notes (Signed)
Pt. Stated, Im hurting all over , just pain. Mostly in my back, arm, nec, shoulder, legs , its a constant pain. It feels numbness in my hands and stay like that.All started 2 weeks ago.

## 2019-06-18 ENCOUNTER — Emergency Department (HOSPITAL_COMMUNITY): Payer: No Typology Code available for payment source

## 2019-06-18 ENCOUNTER — Other Ambulatory Visit: Payer: Self-pay

## 2019-06-18 ENCOUNTER — Encounter (HOSPITAL_COMMUNITY): Payer: Self-pay | Admitting: Emergency Medicine

## 2019-06-18 ENCOUNTER — Emergency Department (HOSPITAL_COMMUNITY)
Admission: EM | Admit: 2019-06-18 | Discharge: 2019-06-18 | Disposition: A | Discharge status: Home / Self Care | Payer: No Typology Code available for payment source | Attending: Emergency Medicine | Admitting: Emergency Medicine

## 2019-06-18 DIAGNOSIS — Z8541 Personal history of malignant neoplasm of cervix uteri: Secondary | ICD-10-CM | POA: Insufficient documentation

## 2019-06-18 DIAGNOSIS — Z20822 Contact with and (suspected) exposure to covid-19: Secondary | ICD-10-CM | POA: Insufficient documentation

## 2019-06-18 DIAGNOSIS — F17210 Nicotine dependence, cigarettes, uncomplicated: Secondary | ICD-10-CM | POA: Insufficient documentation

## 2019-06-18 DIAGNOSIS — R059 Cough, unspecified: Secondary | ICD-10-CM

## 2019-06-18 DIAGNOSIS — R112 Nausea with vomiting, unspecified: Secondary | ICD-10-CM

## 2019-06-18 DIAGNOSIS — Z79899 Other long term (current) drug therapy: Secondary | ICD-10-CM | POA: Insufficient documentation

## 2019-06-18 DIAGNOSIS — R05 Cough: Secondary | ICD-10-CM | POA: Insufficient documentation

## 2019-06-18 LAB — COMPREHENSIVE METABOLIC PANEL
ALT: 16 U/L (ref 0–44)
AST: 15 U/L (ref 15–41)
Albumin: 4.1 g/dL (ref 3.5–5.0)
Alkaline Phosphatase: 80 U/L (ref 38–126)
Anion gap: 12 (ref 5–15)
BUN: 21 mg/dL — ABNORMAL HIGH (ref 6–20)
CO2: 22 mmol/L (ref 22–32)
Calcium: 9.2 mg/dL (ref 8.9–10.3)
Chloride: 104 mmol/L (ref 98–111)
Creatinine, Ser: 0.99 mg/dL (ref 0.44–1.00)
GFR calc Af Amer: >60 mL/min (ref >60)
GFR calc non Af Amer: >60 mL/min (ref >60)
Glucose, Bld: 135 mg/dL — ABNORMAL HIGH (ref 70–99)
Potassium: 3.2 mmol/L — ABNORMAL LOW (ref 3.5–5.1)
Sodium: 138 mmol/L (ref 135–145)
Total Bilirubin: 0.5 mg/dL (ref 0.3–1.2)
Total Protein: 7.6 g/dL (ref 6.5–8.1)

## 2019-06-18 LAB — CBC WITH DIFFERENTIAL/PLATELET
Abs Immature Granulocytes: 0.03 10*3/uL (ref 0.00–0.07)
Basophils Absolute: 0.1 10*3/uL (ref 0.0–0.1)
Basophils Relative: 1 %
Eosinophils Absolute: 0.1 10*3/uL (ref 0.0–0.5)
Eosinophils Relative: 1 %
HCT: 42.5 % (ref 36.0–46.0)
Hemoglobin: 14.5 g/dL (ref 12.0–15.0)
Immature Granulocytes: 0 %
Lymphocytes Relative: 26 %
Lymphs Abs: 3 10*3/uL (ref 0.7–4.0)
MCH: 32.7 pg (ref 26.0–34.0)
MCHC: 34.1 g/dL (ref 30.0–36.0)
MCV: 95.7 fL (ref 80.0–100.0)
Monocytes Absolute: 0.6 10*3/uL (ref 0.1–1.0)
Monocytes Relative: 5 %
Neutro Abs: 7.9 10*3/uL — ABNORMAL HIGH (ref 1.7–7.7)
Neutrophils Relative %: 67 %
Platelets: 338 10*3/uL (ref 150–400)
RBC: 4.44 MIL/uL (ref 3.87–5.11)
RDW: 13.1 % (ref 11.5–15.5)
WBC: 11.7 10*3/uL — ABNORMAL HIGH (ref 4.0–10.5)
nRBC: 0 % (ref 0.0–0.2)

## 2019-06-18 LAB — ABO/RH: ABO/RH(D): AB POS

## 2019-06-18 LAB — PROTIME-INR
INR: 1 (ref 0.8–1.2)
Prothrombin Time: 12.6 seconds (ref 11.4–15.2)

## 2019-06-18 LAB — POC SARS CORONAVIRUS 2 AG -  ED: SARS Coronavirus 2 Ag: NEGATIVE

## 2019-06-18 LAB — TYPE AND SCREEN
ABO/RH(D): AB POS
Antibody Screen: NEGATIVE

## 2019-06-18 LAB — LIPASE, BLOOD: Lipase: 29 U/L (ref 11–51)

## 2019-06-18 LAB — SARS CORONAVIRUS 2 (TAT 6-24 HRS): SARS Coronavirus 2: NEGATIVE

## 2019-06-18 LAB — POC OCCULT BLOOD, ED: Fecal Occult Bld: NEGATIVE

## 2019-06-18 MED ORDER — SODIUM CHLORIDE 0.9 % IV SOLN
8.0000 mg | Freq: Once | INTRAVENOUS | Status: AC
  Administered 2019-06-18: 8 mg via INTRAVENOUS
  Filled 2019-06-18: qty 4

## 2019-06-18 MED ORDER — SODIUM CHLORIDE (PF) 0.9 % IJ SOLN
INTRAMUSCULAR | Status: AC
  Filled 2019-06-18: qty 50

## 2019-06-18 MED ORDER — IOHEXOL 300 MG/ML  SOLN
100.0000 mL | Freq: Once | INTRAMUSCULAR | Status: AC | PRN
  Administered 2019-06-18: 100 mL via INTRAVENOUS

## 2019-06-18 MED ORDER — SODIUM CHLORIDE 0.9 % IV SOLN: INTRAVENOUS | Status: DC

## 2019-06-18 MED ORDER — ONDANSETRON HCL 8 MG PO TABS: 8.0000 mg | ORAL_TABLET | Freq: Three times a day (TID) | ORAL | 0 refills | Status: DC | PRN

## 2019-06-18 MED ORDER — ONDANSETRON HCL 8 MG PO TABS: 8.0000 mg | ORAL_TABLET | Freq: Three times a day (TID) | ORAL | 0 refills | Status: AC | PRN

## 2019-06-18 MED ORDER — MORPHINE SULFATE (PF) 2 MG/ML IV SOLN
2.0000 mg | Freq: Once | INTRAVENOUS | Status: AC
  Administered 2019-06-18: 2 mg via INTRAVENOUS
  Filled 2019-06-18: qty 1

## 2019-06-18 MED ORDER — SODIUM CHLORIDE 0.9 % IV BOLUS
1000.0000 mL | Freq: Once | INTRAVENOUS | Status: AC
  Administered 2019-06-18: 09:00:00 1000 mL via INTRAVENOUS

## 2019-06-18 MED FILL — ONDANSETRON HCL 8 MG TABLET: 8 | 2 days supply | Qty: 6 | Fill #0

## 2019-06-18 NOTE — ED Notes (Addendum)
Ed Provider [place] notified [facility] Covid results are Negative

## 2019-06-18 NOTE — Progress Notes (Signed)
TOC CM contacted Eden Lathe RN [facility] TOC CM. Will follow up with pt on her medications and homelessness. [place], [facility] ED TOC CM [phone]

## 2019-06-18 NOTE — ED Provider Notes (Signed)
[facility] DEPT Provider Note   CSN: [id number] Arrival date & time: 06/18/19  0813     History Chief Complaint  Patient presents with  . Abdominal Pain    Kelli Jenkins is a 59 y.o. female.  59 year old female presents with 4 to 5 days of headache, nausea and vomiting, abdominal pain, diarrhea, cough and shortness of breath.  Patient is currently homeless and stays on peoples couches with at least one known Covid positive contact and multiple other sick contacts. Patient takes 3-4 Excedrin migraines per day for headaches chronically.  Describes vomiting as yellow-green and without blood.  Has not been able to tolerate p.o. intake during the course of this illness.  Abdominal pain is described as cramping and mostly epigastric without resolution from pain medications.  Diarrhea is described as multiple episodes per day with at least 2 episodes of black stool.  She believes that she has had a fever as she feels hot and has chills but has not taken her temperature at home. Patient has history of admission for GI bleed receiving 2 units red blood cells in 2018. Patient hemodynamically stable and afebrile on admission.    Past Medical History:  Diagnosis Date  . Cancer ([place])    uterine  . Cocaine addiction ([place])   . Hypokalemia   . Nephrolithiasis   . Pyelonephritis   . SIRS (systemic inflammatory response syndrome) ([facility])   . Tobacco abuse   . UTI (urinary tract infection) 06/2013    Patient Active Problem List   Diagnosis Date Noted  . Chest pain 06/08/2016  . Nausea & vomiting 06/08/2016  . Intractable vomiting with nausea   . CAP (community acquired pneumonia) 05/03/2016  . Normocytic anemia 05/03/2016  . Urinary tract infection without hematuria   . Opioid overdose ([place]) 01/21/2016  . Somnolence 01/21/2016  . Cocaine abuse ([place]) 01/21/2016  . Opioid abuse ([place]) 01/21/2016  . UTI (urinary tract infection) 07/13/2013  . HCAP  (healthcare-associated pneumonia) 08/16/2011  . Migraine 08/15/2011  . SIRS (systemic inflammatory response syndrome) ([place]) 08/13/2011  . Pyelonephritis 08/12/2011  . Ureteral colic [id number]  . Hypotension 08/12/2011  . Fever 08/12/2011  . Nephrolithiasis 08/12/2011  . Leukocytosis 08/12/2011  . Tobacco use disorder 08/12/2011    Past Surgical History:  Procedure Laterality Date  . ESOPHAGOGASTRODUODENOSCOPY N/A 09/22/2016   Procedure: ESOPHAGOGASTRODUODENOSCOPY (EGD);  Surgeon: Ronald Lobo, MD;  Location: [facility];  Service: Endoscopy;  Laterality: N/A;  . RENAL ARTERY STENT     stones  . TUBAL LIGATION    . VAGINAL HYSTERECTOMY       OB History   No obstetric history on file.     History reviewed. No pertinent family history.  Social History   Tobacco Use  . Smoking status: Current Every Day Smoker    Packs/day: 1.00    Types: Cigarettes  . Smokeless tobacco: Never Used  Substance Use Topics  . Alcohol use: Yes    Comment: occasionally  . Drug use: Yes    Comment: Patient states she is currently smoking crack but does not know what else is in it     Home Medications Prior to Admission medications   Medication Sig Start Date End Date Taking? Authorizing Provider  aspirin-acetaminophen-caffeine (EXCEDRIN MIGRAINE) [phone] MG tablet Take 3 tablets by mouth every 6 (six) hours as needed for headache.   Yes [provider]  methocarbamol (ROBAXIN) 500 MG tablet Take 1 tablet (500 mg total) by mouth 2 (two)  times daily. Patient not taking: Reported on 06/18/2019 01/11/18   Suella Broad A, PA-C  ondansetron (ZOFRAN) 8 MG tablet Take 1 tablet (8 mg total) by mouth every 8 (eight) hours as needed for up to 2 days for nausea or vomiting. 06/18/19 06/20/19  Doristine Mango L, DO  pantoprazole (PROTONIX) 40 MG tablet Take 1 tablet (40 mg total) by mouth daily. Patient not taking: Reported on 06/18/2019 06/10/16   Alphonzo Grieve, MD  promethazine (PHENERGAN)  12.5 MG tablet Take 1 tablet (12.5 mg total) by mouth every 6 (six) hours as needed for nausea or vomiting. Patient not taking: Reported on 06/18/2019 06/09/16   Alphonzo Grieve, MD    Allergies    Darvocet [propoxyphene n-acetaminophen], Ibuprofen, and Tramadol  Review of Systems   Review of Systems  Constitutional: Positive for appetite change, chills, fatigue and fever.  HENT: Positive for rhinorrhea. Negative for sore throat.   Respiratory: Positive for cough and shortness of breath.   Cardiovascular: Negative for chest pain.  Gastrointestinal: Positive for abdominal pain, blood in stool, diarrhea, nausea and vomiting.  Endocrine: Positive for heat intolerance.  Genitourinary: Negative for difficulty urinating.  Neurological: Positive for headaches.    Physical Exam Updated Vital Signs BP 109/63   Pulse 76   Temp 97.8 F (36.6 C) (Oral)   Resp 16   Ht 5\' 8"  (1.727 m)   Wt 102.1 kg   SpO2 95%   BMI 34.21 kg/m   Physical Exam Vitals and nursing note reviewed.  Constitutional:      General: She is not in acute distress.    Appearance: She is ill-appearing.  HENT:     Head: Normocephalic.     Mouth/Throat:     Mouth: Mucous membranes are moist.  Cardiovascular:     Rate and Rhythm: Normal rate and regular rhythm.     Heart sounds: No murmur.  Pulmonary:     Effort: No tachypnea, accessory muscle usage or respiratory distress.     Breath sounds: No decreased air movement. Wheezing (expiratory) present.     Comments: No focal findings Abdominal:     General: Abdomen is protuberant. Bowel sounds are increased.     Palpations: Abdomen is soft. There is no shifting dullness or mass.     Tenderness: There is generalized abdominal tenderness and tenderness in the epigastric area.  Genitourinary:    Rectum: Normal. Guaiac result positive (negative. formed stool in rectal vault).  Skin:    General: Skin is warm and dry.     Capillary Refill: Capillary refill takes 2 to 3  seconds.     Findings: No rash.  Neurological:     General: No focal deficit present.     Mental Status: She is alert.     ED Results / Procedures / Treatments   Labs (all labs ordered are listed, but only abnormal results are displayed) Labs Reviewed  COMPREHENSIVE METABOLIC PANEL - Abnormal; Notable for the following components:      Result Value   Potassium 3.2 (*)    Glucose, Bld 135 (*)    BUN 21 (*)    All other components within normal limits  CBC WITH DIFFERENTIAL/PLATELET - Abnormal; Notable for the following components:   WBC 11.7 (*)    Neutro Abs 7.9 (*)    All other components within normal limits  SARS CORONAVIRUS 2 (TAT 6-24 HRS)  PROTIME-INR  LIPASE, BLOOD  POC OCCULT BLOOD, ED  POC SARS CORONAVIRUS 2 AG -  ED  TYPE AND SCREEN  ABO/RH   EKG None  Radiology CT ABDOMEN PELVIS W CONTRAST  Result Date: 06/18/2019 CLINICAL DATA:  Abdominal pain, nausea, vomiting, diarrhea EXAM: CT ABDOMEN AND PELVIS WITH CONTRAST TECHNIQUE: Multidetector CT imaging of the abdomen and pelvis was performed using the standard protocol following bolus administration of intravenous contrast. CONTRAST:  173mL OMNIPAQUE IOHEXOL 300 MG/ML  SOLN COMPARISON:  06/04/2016 FINDINGS: Lower chest: Right basilar subsegmental atelectasis. Normal heart size. Hepatobiliary: Diffusely decreased attenuation of the hepatic parenchyma suggesting hepatic steatosis. No focal liver lesion. Gallbladder appears unremarkable without hyperdense gallstone or biliary dilatation. Pancreas: Unremarkable. No pancreatic ductal dilatation or surrounding inflammatory changes. Spleen: Normal in size without focal abnormality. Adrenals/Urinary Tract: Unremarkable adrenal glands. Punctate 2 mm nonobstructing calculus within the midpole of the right kidney. Unchanged subcentimeter bilateral renal cysts. No hydronephrosis. Bilateral ureters are unremarkable. Urinary bladder is within normal limits. Stomach/Bowel: Stomach is  within normal limits. Appendix appears normal. Mild scattered colonic diverticulosis. No evidence of bowel wall thickening, distention, or inflammatory changes. Vascular/Lymphatic: Mild scattered atherosclerotic calcifications of the aortoiliac axis. No aneurysm. No abdominopelvic lymphadenopathy. Reproductive: Status post hysterectomy. No adnexal masses. Other: No abdominal wall hernia or abnormality. No abdominopelvic ascites. Musculoskeletal: Multilevel lumbar spondylosis. No acute or significant osseous findings. IMPRESSION: 1. No acute abdominopelvic findings. 2. Hepatic steatosis. 3. Tiny nonobstructing right renal calculus. Aortic Atherosclerosis (ICD10-I70.0). Electronically Signed   By: Davina Poke D.O.   On: 06/18/2019 12:06   DG Chest Port 1 View  Result Date: 06/18/2019 CLINICAL DATA:  Pain and diarrhea EXAM: PORTABLE CHEST 1 VIEW COMPARISON:  June 07, 2016 FINDINGS: Small portions of each inferolateral lung base not seen. Visualized lungs clear. Heart size and pulmonary vascularity are normal. No adenopathy. No pneumothorax. No bone lesions. IMPRESSION: Small portions of each inferolateral lung base not seen. Visualized lungs clear. Cardiac silhouette within normal limits. No adenopathy. Electronically Signed   By: Lowella Grip III M.D.   On: 06/18/2019 09:25    Procedures Procedures (including critical care time)  Medications Ordered in ED Medications  sodium chloride 0.9 % bolus 1,000 mL (0 mLs Intravenous Stopped 06/18/19 0916)    And  0.9 %  sodium chloride infusion ( Intravenous New Bag/Given 06/18/19 0916)  sodium chloride (PF) 0.9 % injection (  Canceled Entry 06/18/19 1128)  ondansetron (ZOFRAN) 8 mg in sodium chloride 0.9 % 50 mL IVPB (0 mg Intravenous Stopped 06/18/19 0953)  morphine 2 MG/ML injection 2 mg (2 mg Intravenous Given 06/18/19 0953)  iohexol (OMNIPAQUE) 300 MG/ML solution 100 mL (100 mLs Intravenous Contrast Given 06/18/19 1130)    ED Course  I have  reviewed the triage vital signs and the nursing notes.  Pertinent labs & imaging results that were available during my care of the patient were reviewed by me and considered in my medical decision making (see chart for details).    MDM Rules/Calculators/A&P                     With history of fever and respiratory symptoms and high risk living situation with known Covid positive contact patient is being ruled out for Covid infection.  Rapid Covid Ag was negative. PCR pending. Chest xray negative with small portion of bases not visualized on image.  Hemodynamically stable, hgb wnl, fecal occult neg. Acute GI bleed unlikely despite having history of same, described melena and chronic NSAID use.  Orthostatic vitals positive for symptoms. Patient given NS bolus, zofran, morphine  with slight improvement.  CT abdomen ordered due to unexplained abdominal pain with slight leukocytosis did not show acute cause of abdominal pain. Small kidney stone. Patient discharged with prescription for zofran and CSW consult for PCP needs.  Final Clinical Impression(s) / ED Diagnoses Final diagnoses:  Cough  Nausea vomiting and diarrhea    Rx / DC Orders ED Discharge Orders         Ordered    ondansetron (ZOFRAN) 8 MG tablet  Every 8 hours PRN     06/18/19 Miller, [place], DO 06/18/19 1240    Isla Pence, MD 06/18/19 1421

## 2019-06-18 NOTE — Discharge Instructions (Addendum)
Your abdominal CT showed no specific cause of your symptoms. This is reassuring as well as your blood work. Your symptoms are likely caused by a virus. If your symptoms do not improve in a couple days, please see a primary care doctor. You will receive contact information from our social work department.

## 2019-06-18 NOTE — ED Notes (Signed)
Off floor for testing 

## 2019-06-18 NOTE — ED Triage Notes (Addendum)
Patient arriving by EMS c/o n/v, stomach cramps, loss of appetite and diarrhea x4 days. Denies any injury/trauma. Patient states pain worse on the right. Also c/o migraine. No fevers, patient is homeless   BP 138/84 P 88 100% RA 169 CBG  T 97.3

## 2019-06-22 NOTE — Progress Notes (Deleted)
Patient ID: Kelli Jenkins, female   DOB: [birth year], 60 y.o.   MRN: [id number]  After ED vsit 06/12/2019.  N/V with exposure to Covid 19.  Homeless.  fromED note: With history of fever and respiratory symptoms and high risk living situation with known Covid positive contact patient is being ruled out for Covid infection.  Rapid Covid Ag was negative. PCR pending. Chest xray negative with small portion of bases not visualized on image.  Hemodynamically stable, hgb wnl, fecal occult neg. Acute GI bleed unlikely despite having history of same, described melena and chronic NSAID use.  Orthostatic vitals positive for symptoms. Patient given NS bolus, zofran, morphine with slight improvement.  CT abdomen ordered due to unexplained abdominal pain with slight leukocytosis did not show acute cause of abdominal pain. Small kidney stone.

## 2019-06-24 ENCOUNTER — Other Ambulatory Visit: Payer: Self-pay

## 2019-06-24 ENCOUNTER — Ambulatory Visit: Payer: Self-pay | Attending: Family Medicine

## 2019-08-30 ENCOUNTER — Encounter (HOSPITAL_COMMUNITY): Payer: Self-pay | Admitting: Emergency Medicine

## 2019-08-30 ENCOUNTER — Emergency Department (HOSPITAL_COMMUNITY): Payer: No Typology Code available for payment source

## 2019-08-30 ENCOUNTER — Emergency Department (HOSPITAL_COMMUNITY)
Admission: EM | Admit: 2019-08-30 | Discharge: 2019-08-31 | Disposition: A | Discharge status: Home / Self Care | Payer: No Typology Code available for payment source | Attending: Emergency Medicine | Admitting: Emergency Medicine

## 2019-08-30 ENCOUNTER — Other Ambulatory Visit: Payer: Self-pay

## 2019-08-30 DIAGNOSIS — L304 Erythema intertrigo: Secondary | ICD-10-CM | POA: Insufficient documentation

## 2019-08-30 DIAGNOSIS — F17210 Nicotine dependence, cigarettes, uncomplicated: Secondary | ICD-10-CM | POA: Insufficient documentation

## 2019-08-30 DIAGNOSIS — R509 Fever, unspecified: Secondary | ICD-10-CM | POA: Insufficient documentation

## 2019-08-30 DIAGNOSIS — Z20822 Contact with and (suspected) exposure to covid-19: Secondary | ICD-10-CM | POA: Insufficient documentation

## 2019-08-30 DIAGNOSIS — Z8542 Personal history of malignant neoplasm of other parts of uterus: Secondary | ICD-10-CM | POA: Insufficient documentation

## 2019-08-30 DIAGNOSIS — J189 Pneumonia, unspecified organism: Secondary | ICD-10-CM | POA: Insufficient documentation

## 2019-08-30 LAB — BASIC METABOLIC PANEL
Anion gap: 9 (ref 5–15)
BUN: 15 mg/dL (ref 6–20)
CO2: 28 mmol/L (ref 22–32)
Calcium: 9 mg/dL (ref 8.9–10.3)
Chloride: 105 mmol/L (ref 98–111)
Creatinine, Ser: 1.12 mg/dL — ABNORMAL HIGH (ref 0.44–1.00)
GFR calc Af Amer: >60 mL/min (ref >60)
GFR calc non Af Amer: 53 mL/min — ABNORMAL LOW (ref >60)
Glucose, Bld: 112 mg/dL — ABNORMAL HIGH (ref 70–99)
Potassium: 4.3 mmol/L (ref 3.5–5.1)
Sodium: 142 mmol/L (ref 135–145)

## 2019-08-30 LAB — URINALYSIS, ROUTINE W REFLEX MICROSCOPIC
Bilirubin Urine: NEGATIVE
Glucose, UA: NEGATIVE mg/dL
Ketones, ur: NEGATIVE mg/dL
Leukocytes,Ua: NEGATIVE
Nitrite: NEGATIVE
Protein, ur: NEGATIVE mg/dL
Specific Gravity, Urine: 1.024 (ref 1.005–1.030)
pH: 6 (ref 5.0–8.0)

## 2019-08-30 LAB — CBC
HCT: 38.9 % (ref 36.0–46.0)
Hemoglobin: 12.4 g/dL (ref 12.0–15.0)
MCH: 31.5 pg (ref 26.0–34.0)
MCHC: 31.9 g/dL (ref 30.0–36.0)
MCV: 98.7 fL (ref 80.0–100.0)
Platelets: 327 10*3/uL (ref 150–400)
RBC: 3.94 MIL/uL (ref 3.87–5.11)
RDW: 13.3 % (ref 11.5–15.5)
WBC: 13.5 10*3/uL — ABNORMAL HIGH (ref 4.0–10.5)
nRBC: 0 % (ref 0.0–0.2)

## 2019-08-30 LAB — MAGNESIUM: Magnesium: 2 mg/dL (ref 1.7–2.4)

## 2019-08-30 LAB — I-STAT BETA HCG BLOOD, ED (MC, WL, AP ONLY): I-stat hCG, quantitative: <5 m[IU]/mL (ref <5)

## 2019-08-30 LAB — HEPATIC FUNCTION PANEL
ALT: 15 U/L (ref 0–44)
AST: 17 U/L (ref 15–41)
Albumin: 3.4 g/dL — ABNORMAL LOW (ref 3.5–5.0)
Alkaline Phosphatase: 91 U/L (ref 38–126)
Bilirubin, Direct: <0.1 mg/dL (ref 0.0–0.2)
Total Bilirubin: 0.4 mg/dL (ref 0.3–1.2)
Total Protein: 6.5 g/dL (ref 6.5–8.1)

## 2019-08-30 LAB — RESPIRATORY PANEL BY RT PCR (FLU A&B, COVID)
Influenza A by PCR: NEGATIVE
Influenza B by PCR: NEGATIVE
SARS Coronavirus 2 by RT PCR: NEGATIVE

## 2019-08-30 MED ORDER — ALBUTEROL SULFATE HFA 108 (90 BASE) MCG/ACT IN AERS
6.0000 | INHALATION_SPRAY | Freq: Once | RESPIRATORY_TRACT | Status: AC
  Administered 2019-08-30: 6 via RESPIRATORY_TRACT
  Filled 2019-08-30: qty 6.7

## 2019-08-30 MED ORDER — ACETAMINOPHEN 325 MG PO TABS
650.0000 mg | ORAL_TABLET | Freq: Once | ORAL | Status: AC
  Administered 2019-08-30: 650 mg via ORAL
  Filled 2019-08-30: qty 2

## 2019-08-30 MED ORDER — SODIUM CHLORIDE 0.9 % IV SOLN
500.0000 mg | Freq: Once | INTRAVENOUS | Status: DC
  Filled 2019-08-30: qty 500

## 2019-08-30 MED ORDER — LACTATED RINGERS IV BOLUS
1000.0000 mL | Freq: Once | INTRAVENOUS | Status: AC
  Administered 2019-08-30: 1000 mL via INTRAVENOUS

## 2019-08-30 MED ORDER — SODIUM CHLORIDE 0.9 % IV SOLN
1.0000 g | Freq: Once | INTRAVENOUS | Status: DC
  Filled 2019-08-30: qty 10

## 2019-08-30 MED ORDER — NYSTATIN 100000 UNIT/GM EX POWD
Freq: Once | CUTANEOUS | Status: AC
  Filled 2019-08-30: qty 15

## 2019-08-30 NOTE — ED Triage Notes (Addendum)
Patient reports rash under her stomach and in pubic areathat appeared yesterday. Patient states that she tried baking soda on the area but it made it worse. Patient states the area is red/purple and wet. Patient states she just doesn't feel good. Patient used cocaine earlier this morning.

## 2019-08-30 NOTE — ED Provider Notes (Signed)
[facility] EMERGENCY DEPARTMENT Provider Note   CSN: [id number] Arrival date & time: 08/30/19  1857     History Chief Complaint  Patient presents with  . Rash  . Shortness of Breath    Kelli Jenkins is a 59 y.o. female.  The history is provided by the patient.   Patient is a 59 year old female who presents for the following symptoms: -Nausea and vomiting since yesterday -Diarrhea since yesterday -Cough, chest tightness, and shortness of breath starting tonight -Low-grade fevers yesterday -Rash in her groin, with worsening associated pain  She denies any known sick contacts.  She has not treated her symptoms at home with any Motrin or Tylenol.  She denies any alcohol use.  She did snort cocaine earlier this morning.  She denies any other drug use.  She denies any chest pain.       Past Medical History:  Diagnosis Date  . Cancer ([place])    uterine  . Cocaine addiction ([place])   . Hypokalemia   . Nephrolithiasis   . Pyelonephritis   . SIRS (systemic inflammatory response syndrome) ([facility])   . Tobacco abuse   . UTI (urinary tract infection) 06/2013    Patient Active Problem List   Diagnosis Date Noted  . Chest pain 06/08/2016  . Nausea & vomiting 06/08/2016  . Intractable vomiting with nausea   . CAP (community acquired pneumonia) 05/03/2016  . Normocytic anemia 05/03/2016  . Urinary tract infection without hematuria   . Opioid overdose ([place]) 01/21/2016  . Somnolence 01/21/2016  . Cocaine abuse ([place]) 01/21/2016  . Opioid abuse ([place]) 01/21/2016  . UTI (urinary tract infection) 07/13/2013  . HCAP (healthcare-associated pneumonia) 08/16/2011  . Migraine 08/15/2011  . SIRS (systemic inflammatory response syndrome) ([place]) 08/13/2011  . Pyelonephritis 08/12/2011  . Ureteral colic [id number]  . Hypotension 08/12/2011  . Fever 08/12/2011  . Nephrolithiasis 08/12/2011  . Leukocytosis 08/12/2011  . Tobacco use disorder 08/12/2011    Past  Surgical History:  Procedure Laterality Date  . ESOPHAGOGASTRODUODENOSCOPY N/A 09/22/2016   Procedure: ESOPHAGOGASTRODUODENOSCOPY (EGD);  Surgeon: Ronald Lobo, MD;  Location: [facility];  Service: Endoscopy;  Laterality: N/A;  . RENAL ARTERY STENT     stones  . TUBAL LIGATION    . VAGINAL HYSTERECTOMY       OB History   No obstetric history on file.     History reviewed. No pertinent family history.  Social History   Tobacco Use  . Smoking status: Current Every Day Smoker    Packs/day: 1.00    Types: Cigarettes  . Smokeless tobacco: Never Used  Substance Use Topics  . Alcohol use: Yes    Comment: occasionally  . Drug use: Yes    Comment: Patient states she is currently smoking crack but does not know what else is in it     Home Medications Prior to Admission medications   Medication Sig Start Date End Date Taking? Authorizing Provider  amoxicillin-clavulanate (AUGMENTIN XR) 1000-62.5 MG 12 hr tablet Take 2 tablets by mouth 2 (two) times daily for 4 days. 08/31/19 09/04/19  Godfrey Pick, MD  aspirin-acetaminophen-caffeine (EXCEDRIN MIGRAINE) [phone] MG tablet Take 3 tablets by mouth every 6 (six) hours as needed for headache.    [provider]  azithromycin (ZITHROMAX) 250 MG tablet Take 1 tablet (250 mg total) by mouth daily for 4 days. 08/31/19 09/04/19  Godfrey Pick, MD  methocarbamol (ROBAXIN) 500 MG tablet Take 1 tablet (500 mg total) by mouth 2 (two) times  daily. Patient not taking: Reported on 06/18/2019 01/11/18   Suella Broad A, PA-C  pantoprazole (PROTONIX) 40 MG tablet Take 1 tablet (40 mg total) by mouth daily. Patient not taking: Reported on 06/18/2019 06/10/16   Alphonzo Grieve, MD  promethazine (PHENERGAN) 12.5 MG tablet Take 1 tablet (12.5 mg total) by mouth every 6 (six) hours as needed for nausea or vomiting. Patient not taking: Reported on 06/18/2019 06/09/16   Alphonzo Grieve, MD    Allergies    Darvocet [propoxyphene n-acetaminophen], Ibuprofen,  and Tramadol  Review of Systems   Review of Systems  Constitutional: Positive for appetite change and fever. Negative for diaphoresis.  HENT: Positive for congestion. Negative for ear pain, facial swelling, rhinorrhea and sore throat.   Eyes: Negative for pain and visual disturbance.  Respiratory: Positive for cough, chest tightness, shortness of breath and wheezing.   Cardiovascular: Negative for chest pain, palpitations and leg swelling.  Gastrointestinal: Positive for diarrhea, nausea and vomiting. Negative for blood in stool.  Genitourinary: Negative for dysuria, flank pain, frequency, hematuria and pelvic pain.  Musculoskeletal: Positive for back pain. Negative for arthralgias, gait problem, joint swelling, neck pain and neck stiffness.  Skin: Positive for rash. Negative for color change and wound.  Neurological: Negative for dizziness, seizures, syncope, facial asymmetry, weakness, light-headedness and numbness.  Hematological: Does not bruise/bleed easily.  Psychiatric/Behavioral: The patient is nervous/anxious.   All other systems reviewed and are negative.   Physical Exam Updated Vital Signs BP 116/66 (BP Location: Right Arm)   Pulse 82   Temp 98.3 F (36.8 C) (Oral)   Resp 20   Ht 5\' 8"  (1.727 m)   Wt 108.9 kg   SpO2 95%   BMI 36.49 kg/m   Physical Exam Vitals and nursing note reviewed.  Constitutional:      General: She is not in acute distress.    Appearance: She is well-developed. She is obese. She is not ill-appearing, toxic-appearing or diaphoretic.  HENT:     Head: Normocephalic and atraumatic.     Mouth/Throat:     Mouth: Mucous membranes are moist.     Pharynx: Oropharynx is clear.  Eyes:     Conjunctiva/sclera: Conjunctivae normal.  Cardiovascular:     Rate and Rhythm: Normal rate and regular rhythm.     Heart sounds: No murmur.  Pulmonary:     Effort: Pulmonary effort is normal. Tachypnea present. No accessory muscle usage or respiratory distress.       Breath sounds: Wheezing present. No decreased breath sounds.     Comments: Cough present Chest:     Chest wall: No tenderness or crepitus.  Abdominal:     Palpations: Abdomen is soft.     Tenderness: There is no abdominal tenderness. There is no guarding.  Musculoskeletal:     Cervical back: Neck supple.     Right lower leg: No tenderness.     Left lower leg: No tenderness.  Lymphadenopathy:     Cervical: No cervical adenopathy.  Skin:    General: Skin is warm and dry.     Comments: Intertriginous fungal rash in bilateral inguinal areas and underneath pannus.  Neurological:     General: No focal deficit present.     Mental Status: She is alert and oriented to person, place, and time.  Psychiatric:        Mood and Affect: Mood is anxious. Affect is labile and tearful.        Speech: Speech is rapid and pressured.  ED Results / Procedures / Treatments   Labs (all labs ordered are listed, but only abnormal results are displayed) Labs Reviewed  BASIC METABOLIC PANEL - Abnormal; Notable for the following components:      Result Value   Glucose, Bld 112 (*)    Creatinine, Ser 1.12 (*)    GFR calc non Af Amer 53 (*)    All other components within normal limits  CBC - Abnormal; Notable for the following components:   WBC 13.5 (*)    All other components within normal limits  HEPATIC FUNCTION PANEL - Abnormal; Notable for the following components:   Albumin 3.4 (*)    All other components within normal limits  URINALYSIS, ROUTINE W REFLEX MICROSCOPIC - Abnormal; Notable for the following components:   APPearance HAZY (*)    Hgb urine dipstick SMALL (*)    Bacteria, UA RARE (*)    All other components within normal limits  RESPIRATORY PANEL BY RT PCR (FLU A&B, COVID)  URINE CULTURE  MAGNESIUM  I-STAT BETA HCG BLOOD, ED ([facility], [facility], [facility] ONLY)    EKG EKG Interpretation  Date/Time:  Sunday Aug 30 2019 19:13:40 EDT Ventricular Rate:  96 PR Interval:  166 QRS  Duration: 76 QT Interval:  354 QTC Calculation: 447 R Axis:   87 Text Interpretation: Normal sinus rhythm Normal ECG No significant change since prior 2/18 Confirmed by Aletta Edouard [phone]) on 08/30/2019 8:05:17 PM   Radiology DG Chest Portable 1 View  Result Date: 08/30/2019 CLINICAL DATA:  Cough and dyspnea EXAM: PORTABLE CHEST 1 VIEW COMPARISON:  06/18/2019 FINDINGS: There is a new right hilar/infrahilar airspace opacity. There is no pneumothorax or large pleural effusion. There is stable elevation of the right hemidiaphragm. There is no pneumothorax. No large pleural effusion. No acute osseous abnormality. IMPRESSION: 1. New right hilar/infrahilar airspace opacity suspicious for pneumonia or atelectasis. Follow-up to radiologic resolution is recommended. 2. No large pleural effusion or pneumothorax. 3. Stable elevation of the right hemidiaphragm. Electronically Signed   By: Constance Holster M.D.   On: 08/30/2019 21:22    Procedures Procedures (including critical care time)  Medications Ordered in ED Medications  acetaminophen (TYLENOL) tablet 650 mg (650 mg Oral Given 08/30/19 2053)  nystatin (MYCOSTATIN/NYSTOP) topical powder ( Topical Given 08/31/19 0137)  lactated ringers bolus 1,000 mL (0 mLs Intravenous Stopped 08/31/19 0100)  albuterol (VENTOLIN HFA) 108 (90 Base) MCG/ACT inhaler 6 puff (6 puffs Inhalation Given 08/30/19 2054)  amoxicillin-clavulanate (AUGMENTIN XR) 1000-62.5 MG per 12 hr tablet 2 tablet (2 tablets Oral Given 08/31/19 0136)  azithromycin (ZITHROMAX) tablet 500 mg (500 mg Oral Given 08/31/19 0137)    ED Course  I have reviewed the triage vital signs and the nursing notes.  Pertinent labs & imaging results that were available during my care of the patient were reviewed by me and considered in my medical decision making (see chart for details).  Clinical Course as of Aug 30 1401  Sun Aug 29, 5413  5050 59 year old female here with complaints of nausea vomiting  diarrhea fever shortness of breath and rash.  She is nontoxic-appearing satting 100% on room air.  Checking some screening labs chest x-ray EKG getting some inhalation treatment along with fluids and nystatin powder.  Likely discharge if work-up unremarkable.   [MB]    Clinical Course User Index [MB] Hayden Rasmussen, MD   MDM Rules/Calculators/A&P  Patient is a 59 year old female who presents for symptoms over the past 2 days that unoccluded following: Chest tightness, shortness of breath, nausea, vomiting, diarrhea, and inguinal rash.  On exam, patient has persistent cough.  Mild wheezing is appreciated throughout lung fields on lung auscultation.  Her abdomen is soft.  Intertriginous rash is present throughout bilateral inguinal areas and under her pannus.  Speech is rapid and pressured.  She is tearful at times.  She does endorse cocaine use earlier today.  She denies any other illicit drug or allergies.  The following was ordered for symptomatic relief: Bolus of IVF, albuterol puffs, Tylenol and nystatin powder.  Following was ordered for diagnostic work-up: Covid testing, EKG, chest x-ray, BMP, CBC, LFT, UA.  Upon reassessment, patient was sleeping and difficult to arouse.  Vital signs remained normal.  SPO2 remained normal on room air.  Patient was able to be awakened enough to answer questions.  She denied taking any of her own medications/drugs.  EKG showed normal sinus rhythm, without any evidence of ischemia, heart strain, arrhythmias or electrolyte disturbances.  Chest x-ray showed new right hilar/infrahilar airspace opacity, suspicious of pneumonia.  Labs are notable for leukocytosis (13.5).  Covid test was negative.  Urinalysis did not show findings suggestive of acute UTI.  Patient was treated empirically for community-acquired pneumonia.  Upon further reassessment, patient was able to wake up.  She was able to ambulate to the bathroom.  She was alert and  oriented she is not sure why she became somnolent earlier.  Patient was prescribed outpatient antibiotics (Augmentin and azithromycin) for treatment of pneumonia.  She was advised to continue to apply antifungal powder to area of intertriginous rash, until rash resolves.  She was advised to follow-up with her PCP and to return to the ED if her symptoms of pneumonia worsen, despite taking antibiotics.  She was discharged in stable condition.   Final Clinical Impression(s) / ED Diagnoses Final diagnoses:  Community acquired pneumonia, unspecified laterality    Rx / DC Orders ED Discharge Orders         Ordered    amoxicillin-clavulanate (AUGMENTIN XR) 1000-62.5 MG 12 hr tablet  2 times daily     08/31/19 0114    azithromycin (ZITHROMAX) 250 MG tablet  Daily     08/31/19 0114           Godfrey Pick, MD 08/31/19 1404    Hayden Rasmussen, MD 09/01/19 [phone]

## 2019-08-30 NOTE — ED Notes (Signed)
This nurse entered pt room to give meds and give COVID test. Pt was extremely lethargic and difficult to arouse. Unable to stay awake, even with constant direction. Denies taking any substances while nurse was not in the room. Breathing is adequate and all VS WNL

## 2019-08-31 MED ORDER — AZITHROMYCIN 250 MG PO TABS
250.0000 mg | ORAL_TABLET | Freq: Every day | ORAL | 0 refills | Status: AC
Start: 2019-08-31 — End: 2019-09-04

## 2019-08-31 MED ORDER — AZITHROMYCIN 250 MG PO TABS
500.0000 mg | ORAL_TABLET | Freq: Once | ORAL | Status: AC
  Administered 2019-08-31: 500 mg via ORAL
  Filled 2019-08-31: qty 2

## 2019-08-31 MED ORDER — AMOXICILLIN-POT CLAVULANATE ER 1000-62.5 MG PO TB12
2.0000 | ORAL_TABLET | Freq: Once | ORAL | Status: AC
  Administered 2019-08-31: 2 via ORAL
  Filled 2019-08-31: qty 2

## 2019-08-31 MED ORDER — AMOXICILLIN-POT CLAVULANATE ER 1000-62.5 MG PO TB12: 2.0000 | ORAL_TABLET | Freq: Two times a day (BID) | ORAL | 0 refills | Status: AC

## 2019-08-31 NOTE — ED Notes (Signed)
All discharge instructions including prescription abx reviewed with pt. No questions at this time. Pt discharged.

## 2019-08-31 NOTE — Discharge Instructions (Addendum)
Take antibiotics as prescribed.  Use inhaler, as needed for symptomatic relief of cough and shortness of breath.  Follow-up with your primary care doctor.  Return to the ED if your symptoms worsen.

## 2019-09-01 ENCOUNTER — Telehealth: Payer: Self-pay | Admitting: *Deleted

## 2019-09-01 LAB — URINE CULTURE: Culture: 80000 — AB

## 2019-09-01 NOTE — Telephone Encounter (Signed)
RNCM placed call to pharmacy to ensure they received MATCH card and informed pt of receipt.  Pt advised to take Rx to pharmacy promptly.

## 2019-09-01 NOTE — Telephone Encounter (Signed)
  [facility] Medication Assistance Card Name: [id number] ID (MRN): Kelli Jenkins [facility]: [id number] RX Group: [id number] Discharge Date: 09/01/2019 Expiration Date:50/19/2021                                           (must be filled within 7 days of discharge)     You have been approved to have the prescriptions written by your discharging physician filled through our [facility] (Medication Assistance Through [facility]) program. This program allows for a one-time (no refills) 34-day supply of selected medications for a low copay amount.  The copay is $0  Only certain pharmacies are participating in this program with [facility]. You will need to select one of the pharmacies from the attached list and take your prescriptions, this letter, and your photo ID to one of the participating pharmacies.   We are excited that you are able to use the [facility] program to get your medications. These prescriptions must be filled within 7 days of hospital discharge or they will no longer be valid for the [facility] program. Should you have any problems with your prescriptions please contact your case management team member at [phone] for [place]/ Why you, [place] [facility]

## 2019-09-02 ENCOUNTER — Telehealth: Payer: Self-pay

## 2019-09-02 NOTE — Telephone Encounter (Signed)
No treatment for [facility] ED 08/31/19 per [place] PA

## 2019-09-04 ENCOUNTER — Encounter (HOSPITAL_COMMUNITY): Payer: Self-pay | Admitting: Emergency Medicine

## 2019-09-04 ENCOUNTER — Emergency Department (HOSPITAL_COMMUNITY)
Admission: EM | Admit: 2019-09-04 | Discharge: 2019-09-04 | Disposition: A | Discharge status: Home / Self Care | Payer: No Typology Code available for payment source | Attending: Emergency Medicine | Admitting: Emergency Medicine

## 2019-09-04 DIAGNOSIS — J189 Pneumonia, unspecified organism: Secondary | ICD-10-CM | POA: Insufficient documentation

## 2019-09-04 DIAGNOSIS — Z5321 Procedure and treatment not carried out due to patient leaving prior to being seen by health care provider: Secondary | ICD-10-CM | POA: Insufficient documentation

## 2019-09-04 LAB — CBC
HCT: 44.8 % (ref 36.0–46.0)
Hemoglobin: 14.3 g/dL (ref 12.0–15.0)
MCH: 31.5 pg (ref 26.0–34.0)
MCHC: 31.9 g/dL (ref 30.0–36.0)
MCV: 98.7 fL (ref 80.0–100.0)
Platelets: 368 10*3/uL (ref 150–400)
RBC: 4.54 MIL/uL (ref 3.87–5.11)
RDW: 13.4 % (ref 11.5–15.5)
WBC: 13.2 10*3/uL — ABNORMAL HIGH (ref 4.0–10.5)
nRBC: 0 % (ref 0.0–0.2)

## 2019-09-04 LAB — BASIC METABOLIC PANEL
Anion gap: 10 (ref 5–15)
BUN: 12 mg/dL (ref 6–20)
CO2: 24 mmol/L (ref 22–32)
Calcium: 8.8 mg/dL — ABNORMAL LOW (ref 8.9–10.3)
Chloride: 103 mmol/L (ref 98–111)
Creatinine, Ser: 0.94 mg/dL (ref 0.44–1.00)
GFR calc Af Amer: >60 mL/min (ref >60)
GFR calc non Af Amer: >60 mL/min (ref >60)
Glucose, Bld: 183 mg/dL — ABNORMAL HIGH (ref 70–99)
Potassium: 4.3 mmol/L (ref 3.5–5.1)
Sodium: 137 mmol/L (ref 135–145)

## 2019-09-04 NOTE — ED Triage Notes (Signed)
Patient c/o just feeling overall poorly. States recently diagnosed with pneumonia and feels like she is not improving. C/o headache, emesis, chills and body aches all week.

## 2019-09-04 NOTE — ED Notes (Signed)
Called pt name for vitals x3, no response from pt

## 2019-09-07 ENCOUNTER — Emergency Department (HOSPITAL_BASED_OUTPATIENT_CLINIC_OR_DEPARTMENT_OTHER)
Admission: EM | Admit: 2019-09-07 | Discharge: 2019-09-07 | Disposition: A | Discharge status: Home / Self Care | Payer: Self-pay | Attending: Emergency Medicine | Admitting: Emergency Medicine

## 2019-09-07 ENCOUNTER — Encounter (HOSPITAL_BASED_OUTPATIENT_CLINIC_OR_DEPARTMENT_OTHER): Payer: Self-pay | Admitting: *Deleted

## 2019-09-07 ENCOUNTER — Other Ambulatory Visit: Payer: Self-pay

## 2019-09-07 ENCOUNTER — Emergency Department (HOSPITAL_BASED_OUTPATIENT_CLINIC_OR_DEPARTMENT_OTHER): Payer: Self-pay

## 2019-09-07 DIAGNOSIS — R6889 Other general symptoms and signs: Secondary | ICD-10-CM

## 2019-09-07 DIAGNOSIS — R112 Nausea with vomiting, unspecified: Secondary | ICD-10-CM | POA: Insufficient documentation

## 2019-09-07 DIAGNOSIS — Z20822 Contact with and (suspected) exposure to covid-19: Secondary | ICD-10-CM | POA: Insufficient documentation

## 2019-09-07 DIAGNOSIS — R6883 Chills (without fever): Secondary | ICD-10-CM | POA: Insufficient documentation

## 2019-09-07 DIAGNOSIS — Z8541 Personal history of malignant neoplasm of cervix uteri: Secondary | ICD-10-CM | POA: Insufficient documentation

## 2019-09-07 DIAGNOSIS — R197 Diarrhea, unspecified: Secondary | ICD-10-CM | POA: Insufficient documentation

## 2019-09-07 DIAGNOSIS — Z885 Allergy status to narcotic agent status: Secondary | ICD-10-CM | POA: Insufficient documentation

## 2019-09-07 DIAGNOSIS — Z886 Allergy status to analgesic agent status: Secondary | ICD-10-CM | POA: Insufficient documentation

## 2019-09-07 DIAGNOSIS — F17210 Nicotine dependence, cigarettes, uncomplicated: Secondary | ICD-10-CM | POA: Insufficient documentation

## 2019-09-07 LAB — CBC WITH DIFFERENTIAL/PLATELET
Abs Immature Granulocytes: 0.07 10*3/uL (ref 0.00–0.07)
Basophils Absolute: 0.1 10*3/uL (ref 0.0–0.1)
Basophils Relative: 0 %
Eosinophils Absolute: 0.5 10*3/uL (ref 0.0–0.5)
Eosinophils Relative: 4 %
HCT: 41.5 % (ref 36.0–46.0)
Hemoglobin: 13.9 g/dL (ref 12.0–15.0)
Immature Granulocytes: 1 %
Lymphocytes Relative: 28 %
Lymphs Abs: 4.1 10*3/uL — ABNORMAL HIGH (ref 0.7–4.0)
MCH: 31.7 pg (ref 26.0–34.0)
MCHC: 33.5 g/dL (ref 30.0–36.0)
MCV: 94.5 fL (ref 80.0–100.0)
Monocytes Absolute: 0.8 10*3/uL (ref 0.1–1.0)
Monocytes Relative: 6 %
Neutro Abs: 8.9 10*3/uL — ABNORMAL HIGH (ref 1.7–7.7)
Neutrophils Relative %: 61 %
Platelets: 335 10*3/uL (ref 150–400)
RBC: 4.39 MIL/uL (ref 3.87–5.11)
RDW: 13.3 % (ref 11.5–15.5)
WBC: 14.4 10*3/uL — ABNORMAL HIGH (ref 4.0–10.5)
nRBC: 0 % (ref 0.0–0.2)

## 2019-09-07 LAB — BASIC METABOLIC PANEL
Anion gap: 8 (ref 5–15)
BUN: 21 mg/dL — ABNORMAL HIGH (ref 6–20)
CO2: 23 mmol/L (ref 22–32)
Calcium: 8.7 mg/dL — ABNORMAL LOW (ref 8.9–10.3)
Chloride: 108 mmol/L (ref 98–111)
Creatinine, Ser: 0.82 mg/dL (ref 0.44–1.00)
GFR calc Af Amer: >60 mL/min (ref >60)
GFR calc non Af Amer: >60 mL/min (ref >60)
Glucose, Bld: 112 mg/dL — ABNORMAL HIGH (ref 70–99)
Potassium: 4 mmol/L (ref 3.5–5.1)
Sodium: 139 mmol/L (ref 135–145)

## 2019-09-07 LAB — SARS CORONAVIRUS 2 BY RT PCR (HOSPITAL ORDER, PERFORMED IN ~~LOC~~ HOSPITAL LAB): SARS Coronavirus 2: NEGATIVE

## 2019-09-07 MED ORDER — ALBUTEROL SULFATE HFA 108 (90 BASE) MCG/ACT IN AERS
2.0000 | INHALATION_SPRAY | Freq: Once | RESPIRATORY_TRACT | Status: AC
  Administered 2019-09-07: 2 via RESPIRATORY_TRACT
  Filled 2019-09-07: qty 6.7

## 2019-09-07 MED ORDER — ONDANSETRON HCL 4 MG/2ML IJ SOLN
4.0000 mg | Freq: Once | INTRAMUSCULAR | Status: AC
  Administered 2019-09-07: 4 mg via INTRAVENOUS
  Filled 2019-09-07: qty 2

## 2019-09-07 MED ORDER — ONDANSETRON 4 MG PO TBDP: 4.0000 mg | ORAL_TABLET | Freq: Three times a day (TID) | ORAL | 0 refills | Status: DC | PRN

## 2019-09-07 MED ORDER — DEXAMETHASONE SODIUM PHOSPHATE 10 MG/ML IJ SOLN
10.0000 mg | Freq: Once | INTRAMUSCULAR | Status: AC
  Administered 2019-09-07: 10 mg via INTRAVENOUS
  Filled 2019-09-07: qty 1

## 2019-09-07 MED ORDER — LEVOFLOXACIN 500 MG PO TABS
500.0000 mg | ORAL_TABLET | Freq: Every day | ORAL | 0 refills | Status: DC
Start: 2019-09-07 — End: 2020-01-27

## 2019-09-07 NOTE — ED Notes (Signed)
PT to X-ray

## 2019-09-07 NOTE — ED Provider Notes (Signed)
[facility] EMERGENCY DEPARTMENT Provider Note   CSN: [id number] Arrival date & time: 09/07/19  0104     History Chief Complaint  Patient presents with  . chills, n/v    Kelli Jenkins is a 59 y.o. female.  HPI     This is a 59 year old female with a history of polysubstance abuse, tobacco use, recent history of HCAP who presents with ongoing chills, nausea, vomiting, diarrhea, cough.  Patient has multiple complaints.  She reports she was diagnosed with pneumonia in early May.  She was given Augmentin and azithromycin.  She states she finished her course of antibiotics.  She reports ongoing chills, myalgia, nonbilious, nonbloody emesis.  No abdominal pain.  She does report diarrhea.  She has taken Tylenol with no significant relief.  Patient reports ongoing cough.  She continues to smoke.  She has noted chills without documented fevers.  She reports recent negative Covid testing.  Past Medical History:  Diagnosis Date  . Cancer ([place])    uterine  . Cocaine addiction ([place])   . Hypokalemia   . Nephrolithiasis   . Pyelonephritis   . SIRS (systemic inflammatory response syndrome) ([facility])   . Tobacco abuse   . UTI (urinary tract infection) 06/2013    Patient Active Problem List   Diagnosis Date Noted  . Chest pain 06/08/2016  . Nausea & vomiting 06/08/2016  . Intractable vomiting with nausea   . CAP (community acquired pneumonia) 05/03/2016  . Normocytic anemia 05/03/2016  . Urinary tract infection without hematuria   . Opioid overdose ([place]) 01/21/2016  . Somnolence 01/21/2016  . Cocaine abuse ([place]) 01/21/2016  . Opioid abuse ([place]) 01/21/2016  . UTI (urinary tract infection) 07/13/2013  . HCAP (healthcare-associated pneumonia) 08/16/2011  . Migraine 08/15/2011  . SIRS (systemic inflammatory response syndrome) ([place]) 08/13/2011  . Pyelonephritis 08/12/2011  . Ureteral colic [id number]  . Hypotension 08/12/2011  . Fever 08/12/2011  . Nephrolithiasis  08/12/2011  . Leukocytosis 08/12/2011  . Tobacco use disorder 08/12/2011    Past Surgical History:  Procedure Laterality Date  . ESOPHAGOGASTRODUODENOSCOPY N/A 09/22/2016   Procedure: ESOPHAGOGASTRODUODENOSCOPY (EGD);  Surgeon: Ronald Lobo, MD;  Location: [facility];  Service: Endoscopy;  Laterality: N/A;  . RENAL ARTERY STENT     stones  . TUBAL LIGATION    . VAGINAL HYSTERECTOMY       OB History   No obstetric history on file.     No family history on file.  Social History   Tobacco Use  . Smoking status: Current Every Day Smoker    Packs/day: 1.00    Types: Cigarettes  . Smokeless tobacco: Never Used  Substance Use Topics  . Alcohol use: Yes    Comment: occasionally  . Drug use: Yes    Types: Cocaine    Comment: Patient states she is currently smoking crack but does not know what else is in it     Home Medications Prior to Admission medications   Medication Sig Start Date End Date Taking? Authorizing Provider  aspirin-acetaminophen-caffeine (EXCEDRIN MIGRAINE) [phone] MG tablet Take 3 tablets by mouth every 6 (six) hours as needed for headache.    [provider]  levofloxacin (LEVAQUIN) 500 MG tablet Take 1 tablet (500 mg total) by mouth daily. 09/07/19   Lonnette Shrode, Barbette Hair, MD  methocarbamol (ROBAXIN) 500 MG tablet Take 1 tablet (500 mg total) by mouth 2 (two) times daily. Patient not taking: Reported on 06/18/2019 01/11/18   Suella Broad A, PA-C  ondansetron (  ZOFRAN ODT) 4 MG disintegrating tablet Take 1 tablet (4 mg total) by mouth every 8 (eight) hours as needed. 09/07/19   Kymani Laursen, Barbette Hair, MD  pantoprazole (PROTONIX) 40 MG tablet Take 1 tablet (40 mg total) by mouth daily. Patient not taking: Reported on 06/18/2019 06/10/16   Alphonzo Grieve, MD  promethazine (PHENERGAN) 12.5 MG tablet Take 1 tablet (12.5 mg total) by mouth every 6 (six) hours as needed for nausea or vomiting. Patient not taking: Reported on 06/18/2019 06/09/16   Alphonzo Grieve,  MD    Allergies    Darvocet [propoxyphene n-acetaminophen], Ibuprofen, and Tramadol  Review of Systems   Review of Systems  Constitutional: Positive for chills. Negative for fever.  HENT: Positive for congestion.   Respiratory: Positive for cough and shortness of breath.   Cardiovascular: Negative for chest pain.  Gastrointestinal: Positive for diarrhea, nausea and vomiting. Negative for abdominal pain.  Genitourinary: Negative for dysuria.  Skin: Positive for rash.  All other systems reviewed and are negative.   Physical Exam Updated Vital Signs BP 118/64   Pulse 76   Temp 97.7 F (36.5 C) (Oral)   Resp 14   Ht 1.727 m (5\' 8" )   Wt 104.3 kg   SpO2 99%   BMI 34.97 kg/m   Physical Exam Vitals and nursing note reviewed.  Constitutional:      Appearance: She is well-developed. She is obese. She is not ill-appearing.  HENT:     Head: Normocephalic and atraumatic.     Nose: Nose normal.     Mouth/Throat:     Mouth: Mucous membranes are moist.  Eyes:     Pupils: Pupils are equal, round, and reactive to light.  Cardiovascular:     Rate and Rhythm: Normal rate and regular rhythm.     Heart sounds: Normal heart sounds.  Pulmonary:     Effort: Pulmonary effort is normal. No respiratory distress.     Breath sounds: No wheezing.  Abdominal:     General: Bowel sounds are normal.     Palpations: Abdomen is soft.     Tenderness: There is no abdominal tenderness.  Musculoskeletal:     Cervical back: Neck supple.     Right lower leg: No edema.     Left lower leg: No edema.  Skin:    General: Skin is warm and dry.     Comments: Rash noted underneath the pannus, no significant erythema or oozing noted  Neurological:     Mental Status: She is alert and oriented to person, place, and time.  Psychiatric:        Mood and Affect: Mood normal.     ED Results / Procedures / Treatments   Labs (all labs ordered are listed, but only abnormal results are displayed) Labs  Reviewed  CBC WITH DIFFERENTIAL/PLATELET - Abnormal; Notable for the following components:      Result Value   WBC 14.4 (*)    Neutro Abs 8.9 (*)    Lymphs Abs 4.1 (*)    All other components within normal limits  BASIC METABOLIC PANEL - Abnormal; Notable for the following components:   Glucose, Bld 112 (*)    BUN 21 (*)    Calcium 8.7 (*)    All other components within normal limits  SARS CORONAVIRUS 2 BY RT PCR (HOSPITAL ORDER, [facility])    EKG None  Radiology DG Chest 2 View  Result Date: 09/07/2019 CLINICAL DATA:  Cough recent pneumonia  and chills EXAM: CHEST - 2 VIEW COMPARISON:  Aug 30, 2019 FINDINGS: The heart size and mediastinal contours are within normal limits. No hazy airspace opacity again noted within the right lower lung. The left lung is clear. No large airspace consolidation or pleural effusion. No acute osseous abnormality. IMPRESSION: Hazy airspace opacity within the right lower lung as on prior exam which may be due to atelectasis and/or infectious etiology. Electronically Signed   By: Prudencio Pair M.D.   On: 09/07/2019 02:45    Procedures Procedures (including critical care time)  Medications Ordered in ED Medications  ondansetron (ZOFRAN) injection 4 mg (4 mg Intravenous Given 09/07/19 0252)  dexamethasone (DECADRON) injection 10 mg (10 mg Intravenous Given 09/07/19 0349)  albuterol (VENTOLIN HFA) 108 (90 Base) MCG/ACT inhaler 2 puff (2 puffs Inhalation Given 09/07/19 0403)    ED Course  I have reviewed the triage vital signs and the nursing notes.  Pertinent labs & imaging results that were available during my care of the patient were reviewed by me and considered in my medical decision making (see chart for details).    MDM Rules/Calculators/A&P                       Patient presents with ongoing flulike symptoms.  She is overall nontoxic and vital signs are reassuring.  She has a fairly benign exam.  She reports finishing a  full course of antibiotics.  Considerations include but not limited to viral etiology, Covid, ongoing pneumonia.  Patient was given Zofran for her nausea.  While she is not wheezing, she was noted by nursing to drop her O2 sats when sleeping.  This is likely related to ongoing smoking plus or minus pneumonia.  She is given Decadron and albuterol.  She maintained her O2 sats with no supplemental oxygen following this intervention.  Chest x-ray shows a persistent infiltrate.  Difficult to assess whether this is any worse or she truly failed a course of outpatient antibiotics.  Given that she is having ongoing symptoms, will switch to Levaquin.  Covid testing negative.  Her symptoms are highly suspicious for viral etiology.  She is overall nontoxic and O2 sats have maintained in the 90s for some time.  She was covered for possible bronchitis and inflammatory changes causing the cough.  Recommend supportive measures at home.  After history, exam, and medical workup I feel the patient has been appropriately medically screened and is safe for discharge home. Pertinent diagnoses were discussed with the patient. Patient was given return precautions.   Final Clinical Impression(s) / ED Diagnoses Final diagnoses:  Flu-like symptoms  Nausea vomiting and diarrhea    Rx / DC Orders ED Discharge Orders         Ordered    levofloxacin (LEVAQUIN) 500 MG tablet  Daily     09/07/19 0519    ondansetron (ZOFRAN ODT) 4 MG disintegrating tablet  Every 8 hours PRN     09/07/19 0519           Merryl Hacker, MD 09/07/19 [id number]

## 2019-09-07 NOTE — ED Notes (Signed)
Delay in lab results - machines being QC'd.

## 2019-09-07 NOTE — Progress Notes (Signed)
Removed patient from nasal cannula.  Patient's SPO2 remained between 91% and 97 % for 8 minutes.  RT will continue to monitor.

## 2019-09-07 NOTE — ED Triage Notes (Addendum)
C/o of generally not feeling well for the past week. Has been seen twice for same symptoms. States she has been recently diagnosed with PN and not improving. C/o chills, body aches, n/v. States vomiting three times today. C/o diarrhea. Last took tylenol this morning. C/o of feeling sob. No distress noted. Dry cough noted.

## 2019-09-07 NOTE — Discharge Instructions (Signed)
You were seen today for ongoing viral type symptoms.  You do have a persistent area on her chest x-ray which is concerning for possible ongoing pneumonia.  We will be given another course of antibiotics given your history.  Your Covid testing was negative.  Make sure to take nausea and pain medication as directed.  Use your inhaler as needed for cough.  Make sure you are staying hydrated.

## 2019-09-07 NOTE — ED Notes (Addendum)
Pt's ride - [facility] [place] [phone]

## 2020-01-22 ENCOUNTER — Inpatient Hospital Stay (HOSPITAL_COMMUNITY)
Admission: EM | Admit: 2020-01-22 | Discharge: 2020-01-27 | DRG: 872 | Disposition: A | Discharge status: Home / Self Care | Payer: Self-pay | Attending: Internal Medicine | Admitting: Internal Medicine

## 2020-01-22 ENCOUNTER — Other Ambulatory Visit: Payer: Self-pay

## 2020-01-22 ENCOUNTER — Emergency Department (HOSPITAL_COMMUNITY): Payer: Self-pay

## 2020-01-22 ENCOUNTER — Encounter (HOSPITAL_COMMUNITY): Payer: Self-pay | Admitting: Emergency Medicine

## 2020-01-22 DIAGNOSIS — K029 Dental caries, unspecified: Secondary | ICD-10-CM | POA: Diagnosis present

## 2020-01-22 DIAGNOSIS — Z23 Encounter for immunization: Secondary | ICD-10-CM

## 2020-01-22 DIAGNOSIS — A419 Sepsis, unspecified organism: Principal | ICD-10-CM | POA: Diagnosis present

## 2020-01-22 DIAGNOSIS — L0390 Cellulitis, unspecified: Secondary | ICD-10-CM | POA: Diagnosis present

## 2020-01-22 DIAGNOSIS — G9340 Encephalopathy, unspecified: Secondary | ICD-10-CM | POA: Clinically undetermined

## 2020-01-22 DIAGNOSIS — E669 Obesity, unspecified: Secondary | ICD-10-CM | POA: Diagnosis present

## 2020-01-22 DIAGNOSIS — Z20822 Contact with and (suspected) exposure to covid-19: Secondary | ICD-10-CM | POA: Diagnosis present

## 2020-01-22 DIAGNOSIS — Z888 Allergy status to other drugs, medicaments and biological substances status: Secondary | ICD-10-CM

## 2020-01-22 DIAGNOSIS — B9562 Methicillin resistant Staphylococcus aureus infection as the cause of diseases classified elsewhere: Secondary | ICD-10-CM | POA: Diagnosis present

## 2020-01-22 DIAGNOSIS — F1410 Cocaine abuse, uncomplicated: Secondary | ICD-10-CM | POA: Diagnosis present

## 2020-01-22 DIAGNOSIS — K5900 Constipation, unspecified: Secondary | ICD-10-CM | POA: Diagnosis present

## 2020-01-22 DIAGNOSIS — F17210 Nicotine dependence, cigarettes, uncomplicated: Secondary | ICD-10-CM | POA: Diagnosis present

## 2020-01-22 DIAGNOSIS — I951 Orthostatic hypotension: Secondary | ICD-10-CM | POA: Diagnosis present

## 2020-01-22 DIAGNOSIS — Z6833 Body mass index (BMI) 33.0-33.9, adult: Secondary | ICD-10-CM

## 2020-01-22 DIAGNOSIS — K0530 Chronic periodontitis, unspecified: Secondary | ICD-10-CM | POA: Diagnosis present

## 2020-01-22 DIAGNOSIS — L03311 Cellulitis of abdominal wall: Secondary | ICD-10-CM | POA: Diagnosis present

## 2020-01-22 DIAGNOSIS — Z87442 Personal history of urinary calculi: Secondary | ICD-10-CM

## 2020-01-22 DIAGNOSIS — J449 Chronic obstructive pulmonary disease, unspecified: Secondary | ICD-10-CM | POA: Diagnosis present

## 2020-01-22 DIAGNOSIS — K0510 Chronic gingivitis, plaque induced: Secondary | ICD-10-CM | POA: Diagnosis present

## 2020-01-22 DIAGNOSIS — Z885 Allergy status to narcotic agent status: Secondary | ICD-10-CM

## 2020-01-22 DIAGNOSIS — R6520 Severe sepsis without septic shock: Secondary | ICD-10-CM | POA: Diagnosis present

## 2020-01-22 DIAGNOSIS — Z5900 Homelessness unspecified: Secondary | ICD-10-CM

## 2020-01-22 DIAGNOSIS — R6510 Systemic inflammatory response syndrome (SIRS) of non-infectious origin without acute organ dysfunction: Secondary | ICD-10-CM

## 2020-01-22 LAB — CBC WITH DIFFERENTIAL/PLATELET
Abs Immature Granulocytes: 0.1 10*3/uL — ABNORMAL HIGH (ref 0.00–0.07)
Basophils Absolute: 0.1 10*3/uL (ref 0.0–0.1)
Basophils Relative: 0 %
Eosinophils Absolute: 0 10*3/uL (ref 0.0–0.5)
Eosinophils Relative: 0 %
HCT: 41.5 % (ref 36.0–46.0)
Hemoglobin: 13.9 g/dL (ref 12.0–15.0)
Immature Granulocytes: 1 %
Lymphocytes Relative: 5 %
Lymphs Abs: 1 10*3/uL (ref 0.7–4.0)
MCH: 32 pg (ref 26.0–34.0)
MCHC: 33.5 g/dL (ref 30.0–36.0)
MCV: 95.4 fL (ref 80.0–100.0)
Monocytes Absolute: 1.1 10*3/uL — ABNORMAL HIGH (ref 0.1–1.0)
Monocytes Relative: 5 %
Neutro Abs: 18.4 10*3/uL — ABNORMAL HIGH (ref 1.7–7.7)
Neutrophils Relative %: 89 %
Platelets: 254 10*3/uL (ref 150–400)
RBC: 4.35 MIL/uL (ref 3.87–5.11)
RDW: 13.6 % (ref 11.5–15.5)
WBC: 20.7 10*3/uL — ABNORMAL HIGH (ref 4.0–10.5)
nRBC: 0 % (ref 0.0–0.2)

## 2020-01-22 LAB — BASIC METABOLIC PANEL
Anion gap: 10 (ref 5–15)
BUN: 11 mg/dL (ref 6–20)
CO2: 23 mmol/L (ref 22–32)
Calcium: 8.1 mg/dL — ABNORMAL LOW (ref 8.9–10.3)
Chloride: 102 mmol/L (ref 98–111)
Creatinine, Ser: 0.99 mg/dL (ref 0.44–1.00)
GFR calc Af Amer: >60 mL/min (ref >60)
GFR calc non Af Amer: >60 mL/min (ref >60)
Glucose, Bld: 118 mg/dL — ABNORMAL HIGH (ref 70–99)
Potassium: 4 mmol/L (ref 3.5–5.1)
Sodium: 135 mmol/L (ref 135–145)

## 2020-01-22 LAB — ETHANOL: Alcohol, Ethyl (B): <10 mg/dL (ref <10)

## 2020-01-22 LAB — PROTIME-INR
INR: 1 (ref 0.8–1.2)
Prothrombin Time: 12.9 seconds (ref 11.4–15.2)

## 2020-01-22 LAB — URINALYSIS, ROUTINE W REFLEX MICROSCOPIC
Bilirubin Urine: NEGATIVE
Glucose, UA: NEGATIVE mg/dL
Hgb urine dipstick: NEGATIVE
Ketones, ur: NEGATIVE mg/dL
Leukocytes,Ua: NEGATIVE
Nitrite: NEGATIVE
Protein, ur: NEGATIVE mg/dL
Specific Gravity, Urine: 1.015 (ref 1.005–1.030)
pH: 8 (ref 5.0–8.0)

## 2020-01-22 LAB — HEPATIC FUNCTION PANEL
ALT: 13 U/L (ref 0–44)
AST: 15 U/L (ref 15–41)
Albumin: 3 g/dL — ABNORMAL LOW (ref 3.5–5.0)
Alkaline Phosphatase: 59 U/L (ref 38–126)
Bilirubin, Direct: 0.1 mg/dL (ref 0.0–0.2)
Indirect Bilirubin: 0.6 mg/dL (ref 0.3–0.9)
Total Bilirubin: 0.7 mg/dL (ref 0.3–1.2)
Total Protein: 6 g/dL — ABNORMAL LOW (ref 6.5–8.1)

## 2020-01-22 LAB — LACTIC ACID, PLASMA
Lactic Acid, Venous: 1.1 mmol/L (ref 0.5–1.9)
Lactic Acid, Venous: 1 mmol/L (ref 0.5–1.9)

## 2020-01-22 LAB — RAPID URINE DRUG SCREEN, HOSP PERFORMED
Amphetamines: NOT DETECTED
Barbiturates: NOT DETECTED
Benzodiazepines: NOT DETECTED
Cocaine: POSITIVE — AB
Opiates: NOT DETECTED
Tetrahydrocannabinol: NOT DETECTED

## 2020-01-22 LAB — APTT: aPTT: 28 seconds (ref 24–36)

## 2020-01-22 LAB — RESPIRATORY PANEL BY RT PCR (FLU A&B, COVID)
Influenza A by PCR: NEGATIVE
Influenza B by PCR: NEGATIVE
SARS Coronavirus 2 by RT PCR: NEGATIVE

## 2020-01-22 LAB — HIV ANTIBODY (ROUTINE TESTING W REFLEX): HIV Screen 4th Generation wRfx: NONREACTIVE

## 2020-01-22 MED ORDER — ENOXAPARIN SODIUM 40 MG/0.4ML ~~LOC~~ SOLN
40.0000 mg | SUBCUTANEOUS | Status: DC
  Administered 2020-01-22: 40 mg via SUBCUTANEOUS
  Filled 2020-01-22: qty 0.4

## 2020-01-22 MED ORDER — VANCOMYCIN HCL IN DEXTROSE 1-5 GM/200ML-% IV SOLN
1000.0000 mg | Freq: Once | INTRAVENOUS | Status: AC
  Administered 2020-01-22: 1000 mg via INTRAVENOUS
  Filled 2020-01-22: qty 200

## 2020-01-22 MED ORDER — IOHEXOL 300 MG/ML  SOLN
100.0000 mL | Freq: Once | INTRAMUSCULAR | Status: AC | PRN
  Administered 2020-01-22: 100 mL via INTRAVENOUS

## 2020-01-22 MED ORDER — SODIUM CHLORIDE 0.9 % IV BOLUS
1000.0000 mL | Freq: Once | INTRAVENOUS | Status: AC
  Administered 2020-01-22: 1000 mL via INTRAVENOUS

## 2020-01-22 MED ORDER — LACTATED RINGERS IV BOLUS (SEPSIS)
1000.0000 mL | Freq: Once | INTRAVENOUS | Status: AC
  Administered 2020-01-22: 1000 mL via INTRAVENOUS

## 2020-01-22 MED ORDER — SODIUM CHLORIDE (PF) 0.9 % IJ SOLN
INTRAMUSCULAR | Status: AC
  Filled 2020-01-22: qty 50

## 2020-01-22 MED ORDER — ACETAMINOPHEN 325 MG PO TABS
650.0000 mg | ORAL_TABLET | Freq: Four times a day (QID) | ORAL | Status: DC | PRN
  Administered 2020-01-22 (×2): 650 mg via ORAL
  Filled 2020-01-22 (×3): qty 2

## 2020-01-22 MED ORDER — VANCOMYCIN HCL IN DEXTROSE 1-5 GM/200ML-% IV SOLN: 1000.0000 mg | Freq: Once | INTRAVENOUS | Status: DC

## 2020-01-22 MED ORDER — ACETAMINOPHEN 325 MG PO TABS
650.0000 mg | ORAL_TABLET | Freq: Once | ORAL | Status: AC
  Administered 2020-01-22: 650 mg via ORAL
  Filled 2020-01-22: qty 2

## 2020-01-22 MED ORDER — TETANUS-DIPHTH-ACELL PERTUSSIS 5-2.5-18.5 LF-MCG/0.5 IM SUSP
0.5000 mL | Freq: Once | INTRAMUSCULAR | Status: AC
  Administered 2020-01-22: 0.5 mL via INTRAMUSCULAR
  Filled 2020-01-22: qty 0.5

## 2020-01-22 MED ORDER — ACETAMINOPHEN 650 MG RE SUPP: 650.0000 mg | Freq: Four times a day (QID) | RECTAL | Status: DC | PRN

## 2020-01-22 MED ORDER — VANCOMYCIN HCL 1500 MG/300ML IV SOLN
1500.0000 mg | INTRAVENOUS | Status: DC
  Administered 2020-01-22 – 2020-01-23 (×2): 1500 mg via INTRAVENOUS
  Filled 2020-01-22 (×2): qty 300

## 2020-01-22 MED ORDER — CLINDAMYCIN HCL 300 MG PO CAPS
450.0000 mg | ORAL_CAPSULE | Freq: Once | ORAL | Status: AC
  Administered 2020-01-22: 450 mg via ORAL
  Filled 2020-01-22: qty 1

## 2020-01-22 MED ORDER — LACTATED RINGERS IV BOLUS
1000.0000 mL | Freq: Once | INTRAVENOUS | Status: AC
  Administered 2020-01-22: 1000 mL via INTRAVENOUS

## 2020-01-22 MED ORDER — LIDOCAINE-EPINEPHRINE (PF) 2 %-1:200000 IJ SOLN
10.0000 mL | Freq: Once | INTRAMUSCULAR | Status: AC
  Administered 2020-01-22: 10 mL
  Filled 2020-01-22: qty 10

## 2020-01-22 NOTE — Progress Notes (Signed)
Pharmacy Antibiotic Note  Kelli Jenkins is a 59 y.o. female admitted on 01/22/2020 with cellulitis on lower abdomen and legs.  Pharmacy has been consulted for vancomycin dosing.  Plan:  Vancomycin 1000 mg IV now, then 1500 mg IV q24 hr  Consider alternative agent (Zyvox, Dapto) if MRSA coverage required beyond 3-4 days, due to shortage of vancomycin level reagent  SCr q48 while on vanc      Temp (24hrs), Avg:100.5 F (38.1 C), Min:99 F (37.2 C), Max:101.7 F (38.7 C)  Recent Labs  Lab 01/22/20 0850 01/22/20 1019  WBC 20.7*  --   CREATININE  --  0.99  LATICACIDVEN 1.1 1.0    CrCl cannot be calculated (Unknown ideal weight.).    Allergies  Allergen Reactions  . Darvocet [Propoxyphene N-Acetaminophen] Swelling    OK to take acetaminophen  . Ibuprofen Swelling    Ok to take ketorolac  . Tramadol Swelling     Thank you for allowing pharmacy to be a part of this patient's care.  Taron Conrey A 01/22/2020 7:35 PM

## 2020-01-22 NOTE — H&P (Signed)
Kelli Jenkins is an 59 y.o. female.   Chief Complaint: Fever, chills, erythematous rash that is painful to touch. HPI: The patient is a 59 yr old woman who is homeless. Kelli Jenkins was recently sleeping outside on a couch in the backyard of a friend. Kelli Jenkins noticed that Kelli Jenkins had developed erythematous raised lesions all over her body. Kelli Jenkins denies scratching these lesions or picking at them.  In the ED Kelli Jenkins is found to be hypotensive, febrile, tachycardic, and tachypneic.  Lab demonstrates WBC of 20.7.   CT of the abdomen and pelvis demonstrated lateral upper pelvic wall soft tissue thickening and stranding without well definced area of fluid collection or abscess. There was no abnormal fluid or abscess in the abdomen or pelvis. No bowel wall thickening ro bowel obstruction.  PA in ED "I&D'd" the erythematous and indurated area on the patient's left flank. Kelli Jenkins stated that pus oozed from the area. Cultures. Blood culture x 1 was obtained.  [facility] hospitalists were consulted to admit the patient for further evaluation and treatment.  Past Medical History:  Diagnosis Date  . Cancer ([place])    uterine  . Cocaine addiction ([place])   . Hypokalemia   . Nephrolithiasis   . Pyelonephritis   . SIRS (systemic inflammatory response syndrome) ([facility])   . Tobacco abuse   . UTI (urinary tract infection) 06/2013    Past Surgical History:  Procedure Laterality Date  . ESOPHAGOGASTRODUODENOSCOPY N/A 09/22/2016   Procedure: ESOPHAGOGASTRODUODENOSCOPY (EGD);  Surgeon: Ronald Lobo, MD;  Location: [facility];  Service: Endoscopy;  Laterality: N/A;  . RENAL ARTERY STENT     stones  . TUBAL LIGATION    . VAGINAL HYSTERECTOMY      No family history on file. Social History:  reports that Kelli Jenkins has been smoking cigarettes. Kelli Jenkins has been smoking about 1.00 pack per day. Kelli Jenkins has never used smokeless tobacco. Kelli Jenkins reports current alcohol use. Kelli Jenkins reports current drug use. Drug: Cocaine. Medications Prior to Admission   Medication Sig Dispense Refill  . aspirin-acetaminophen-caffeine (EXCEDRIN MIGRAINE) 250-250-65 MG tablet Take 3 tablets by mouth every 6 (six) hours as needed for headache.    . levofloxacin (LEVAQUIN) 500 MG tablet Take 1 tablet (500 mg total) by mouth daily. (Patient not taking: Reported on 01/22/2020) 7 tablet 0  . methocarbamol (ROBAXIN) 500 MG tablet Take 1 tablet (500 mg total) by mouth 2 (two) times daily. (Patient not taking: Reported on 06/18/2019) 20 tablet 0  . ondansetron (ZOFRAN ODT) 4 MG disintegrating tablet Take 1 tablet (4 mg total) by mouth every 8 (eight) hours as needed. (Patient not taking: Reported on 01/22/2020) 20 tablet 0  . pantoprazole (PROTONIX) 40 MG tablet Take 1 tablet (40 mg total) by mouth daily. (Patient not taking: Reported on 06/18/2019) 30 tablet 2  . promethazine (PHENERGAN) 12.5 MG tablet Take 1 tablet (12.5 mg total) by mouth every 6 (six) hours as needed for nausea or vomiting. (Patient not taking: Reported on 06/18/2019) 30 tablet 0    Allergies:  Allergies  Allergen Reactions  . Darvocet [Propoxyphene N-Acetaminophen] Swelling    OK to take acetaminophen  . Ibuprofen Swelling    Ok to take ketorolac  . Tramadol Swelling    Pertinent items noted in HPI and remainder of comprehensive ROS otherwise negative.   General appearance: alert, cooperative and mild distress Head: Normocephalic, without obvious abnormality, atraumatic Eyes: conjunctivae/corneas clear. PERRL, EOM's intact. Fundi benign. Throat: lips, mucosa, and tongue normal; teeth and gums normal Neck: no adenopathy,  no carotid bruit, no JVD, supple, symmetrical, trachea midline and thyroid not enlarged, symmetric, no tenderness/mass/nodules Resp: No increased work of breathing. No wheezes, rales, or rhonchi. No tactile fremitus. Chest wall: no tenderness Cardio: regular rate and rhythm, S1, S2 normal, no murmur, click, rub or gallop GI: Soft. Tender, erythematous and indurated located on  the lateral aspect 'flank" on the patient's left. It is warm and tender to touch. It is bandanged where it was I & D'd. Bowel sounds are normal Abdomen is not distended. No organomegaly, masses, or hernias ae appreciated. Extremities: extremities normal, atraumatic, no cyanosis or edema Pulses: 2+ and symmetric Skin: Skin color, texture, turgor normal. No rashes or lesions There are puncgtate erythematou lesions all over the patient's body with evidence of excoriation in the center of each lesion. Lymph nodes: Cervical, supraclavicular, and axillary nodes normal. Neurologic: Alert and oriented X 3, normal strength and tone. Normal symmetric reflexes. Normal coordination and gait   Results for orders placed or performed during the hospital encounter of 01/22/20 (from the past 48 hour(s))  CBC with Differential/Platelet     Status: Abnormal   Collection Time: 01/22/20  8:50 AM  Result Value Ref Range   WBC 20.7 (H) 4.0 - 10.5 K/uL   RBC 4.35 3.87 - 5.11 MIL/uL   Hemoglobin 13.9 12.0 - 15.0 g/dL   HCT 41.5 36 - 46 %   MCV 95.4 80.0 - 100.0 fL   MCH 32.0 26.0 - 34.0 pg   MCHC 33.5 30.0 - 36.0 g/dL   RDW 13.6 11.5 - 15.5 %   Platelets 254 150 - 400 K/uL   nRBC 0.0 0.0 - 0.2 %   Neutrophils Relative % 89 %   Neutro Abs 18.4 (H) 1.7 - 7.7 K/uL   Lymphocytes Relative 5 %   Lymphs Abs 1.0 0.7 - 4.0 K/uL   Monocytes Relative 5 %   Monocytes Absolute 1.1 (H) 0 - 1 K/uL   Eosinophils Relative 0 %   Eosinophils Absolute 0.0 0 - 0 K/uL   Basophils Relative 0 %   Basophils Absolute 0.1 0 - 0 K/uL   Immature Granulocytes 1 %   Abs Immature Granulocytes 0.10 (H) 0.00 - 0.07 K/uL    Comment: Performed at [facility], [place] [address] [place], [place] [address]  Lactic acid, plasma     Status: None   Collection Time: 01/22/20  8:50 AM  Result Value Ref Range   Lactic Acid, Venous 1.1 0.5 - 1.9 mmol/L    Comment: Performed at [facility], [place] [address] [place], [place] [address]  Lactic acid, plasma     Status: None   Collection Time: 01/22/20 10:19 AM  Result Value Ref Range   Lactic Acid, Venous 1.0 0.5 - 1.9 mmol/L    Comment: Performed at [facility], [place] [address], [place], [place] [address]  Respiratory Panel by RT PCR (Flu A&B, Covid) - Nasopharyngeal Swab     Status: None   Collection Time: 01/22/20 10:19 AM   Specimen: Nasopharyngeal Swab  Result Value Ref Range   SARS Coronavirus 2 by RT PCR NEGATIVE NEGATIVE    Comment: (NOTE) SARS-CoV-2 target nucleic acids are NOT DETECTED.  The SARS-CoV-2 RNA is generally detectable in upper respiratoy specimens during the acute phase of infection. The lowest concentration of SARS-CoV-2 viral copies this assay can detect is 131 copies/mL. A negative result does not preclude SARS-Cov-2 infection and should not be used as  the sole basis for treatment or other patient management decisions. A negative result may occur with  improper specimen collection/handling, submission of specimen other than nasopharyngeal swab, presence of viral mutation(s) within the areas targeted by this assay, and inadequate number of viral copies (<131 copies/mL). A negative result must be combined with clinical observations, patient history, and epidemiological information. The expected result is Negative.  Fact Sheet for Patients:  PinkCheek.be  Fact Sheet for Healthcare Providers:  GravelBags.it  This test is no t yet approved or cleared by the [place] FDA and  has been authorized for detection and/or diagnosis of SARS-CoV-2 by FDA under an Emergency Use Authorization (EUA). This EUA will remain  in effect (meaning this test can be used) for the duration of the COVID-19 declaration under Section 564(b)(1) of the Act, 21 U.S.C. section 360bbb-3(b)(1), unless the authorization is terminated or revoked sooner.      Influenza A by PCR NEGATIVE NEGATIVE   Influenza B by PCR NEGATIVE NEGATIVE    Comment: (NOTE) The Xpert Xpress SARS-CoV-2/FLU/RSV assay is intended as an aid in  the diagnosis of influenza from Nasopharyngeal swab specimens and  should not be used as a sole basis for treatment. Nasal washings and  aspirates are unacceptable for Xpert Xpress SARS-CoV-2/FLU/RSV  testing.  Fact Sheet for Patients: PinkCheek.be  Fact Sheet for Healthcare Providers: GravelBags.it  This test is not yet approved or cleared by the [place] FDA and  has been authorized for detection and/or diagnosis of SARS-CoV-2 by  FDA under an Emergency Use Authorization (EUA). This EUA will remain  in effect (meaning this test can be used) for the duration of the  Covid-19 declaration under Section 564(b)(1) of the Act, 21  U.S.C. section 360bbb-3(b)(1), unless the authorization is  terminated or revoked. Performed at [facility], [place] [address], [place], [place] [address]   Basic metabolic panel     Status: Abnormal   Collection Time: 01/22/20 10:19 AM  Result Value Ref Range   Sodium 135 135 - 145 mmol/L   Potassium 4.0 3.5 - 5.1 mmol/L   Chloride 102 98 - 111 mmol/L   CO2 23 22 - 32 mmol/L   Glucose, Bld 118 (H) 70 - 99 mg/dL    Comment: Glucose reference range applies only to samples taken after fasting for at least 8 hours.   BUN 11 6 - 20 mg/dL   Creatinine, Ser 0.99 0.44 - 1.00 mg/dL   Calcium 8.1 (L) 8.9 - 10.3 mg/dL   GFR calc non Af Amer >60 >60 mL/min   GFR calc Af Amer >60 >60 mL/min   Anion gap 10 5 - 15    Comment: Performed at [facility], [place] [address] [place], [place] [address]  Urinalysis, Routine w reflex microscopic Urine, Clean Catch     Status: None   Collection Time: 01/22/20 10:29 AM  Result Value Ref Range   Color, Urine YELLOW YELLOW   APPearance CLEAR CLEAR   Specific Gravity,  Urine 1.015 1.005 - 1.030   pH 8.0 5.0 - 8.0   Glucose, UA NEGATIVE NEGATIVE mg/dL   Hgb urine dipstick NEGATIVE NEGATIVE   Bilirubin Urine NEGATIVE NEGATIVE   Ketones, ur NEGATIVE NEGATIVE mg/dL   Protein, ur NEGATIVE NEGATIVE mg/dL   Nitrite NEGATIVE NEGATIVE   Leukocytes,Ua NEGATIVE NEGATIVE    Comment: Performed at [facility], [place] [address] [place],  [address]  Rapid urine drug screen (hospital performed)  Status: Abnormal   Collection Time: 01/22/20 10:29 AM  Result Value Ref Range   Opiates NONE DETECTED NONE DETECTED   Cocaine POSITIVE (A) NONE DETECTED   Benzodiazepines NONE DETECTED NONE DETECTED   Amphetamines NONE DETECTED NONE DETECTED   Tetrahydrocannabinol NONE DETECTED NONE DETECTED   Barbiturates NONE DETECTED NONE DETECTED    Comment: (NOTE) DRUG SCREEN FOR MEDICAL PURPOSES ONLY.  IF CONFIRMATION IS NEEDED FOR ANY PURPOSE, NOTIFY LAB WITHIN 5 DAYS.  LOWEST DETECTABLE LIMITS FOR URINE DRUG SCREEN Drug Class                     Cutoff (ng/mL) Amphetamine and metabolites    1000 Barbiturate and metabolites    200 Benzodiazepine                 357 Tricyclics and metabolites     300 Opiates and metabolites        300 Cocaine and metabolites        300 THC                            50 Performed at [facility], [place] [address], [place], [place] [address]   Protime-INR     Status: None   Collection Time: 01/22/20 10:49 AM  Result Value Ref Range   Prothrombin Time 12.9 11.4 - 15.2 seconds   INR 1.0 0.8 - 1.2    Comment: (NOTE) INR goal varies based on device and disease states. Performed at [facility], [place] [address], [place], [place] [address]   APTT     Status: None   Collection Time: 01/22/20 10:49 AM  Result Value Ref Range   aPTT 28 24 - 36 seconds    Comment: Performed at [facility], [place] [address], [place], [place] [address]  Hepatic function panel      Status: Abnormal   Collection Time: 01/22/20 10:49 AM  Result Value Ref Range   Total Protein 6.0 (L) 6.5 - 8.1 g/dL   Albumin 3.0 (L) 3.5 - 5.0 g/dL   AST 15 15 - 41 U/L   ALT 13 0 - 44 U/L   Alkaline Phosphatase 59 38 - 126 U/L   Total Bilirubin 0.7 0.3 - 1.2 mg/dL   Bilirubin, Direct 0.1 0.0 - 0.2 mg/dL   Indirect Bilirubin 0.6 0.3 - 0.9 mg/dL    Comment: Performed at [facility], [place] [address] [place], [place] [address]  Ethanol     Status: None   Collection Time: 01/22/20 12:39 PM  Result Value Ref Range   Alcohol, Ethyl (B) <10 <10 mg/dL    Comment: (NOTE) Lowest detectable limit for serum alcohol is 10 mg/dL.  For medical purposes only. Performed at [facility], [place] [address], [place], [place] [address]   HIV Antibody (routine testing w rflx)     Status: None   Collection Time: 01/22/20  5:13 PM  Result Value Ref Range   HIV Screen 4th Generation wRfx Non Reactive Non Reactive    Comment: Performed at [facility], [place] [address], [place], [place] [address]   @[email or web address] @  Blood pressure (!) 108/40, pulse 95, temperature 99.3 F (37.4 C), temperature source Oral, resp. rate 16, SpO2 97 %.   Assessment/Plan Problem  Cellulitis  Severe Sepsis (Hcc)  Homelessness  Cocaine abuse ([facility])   Severe Sepsis: The patient is hypotensive,  febrile, tachypneic, and tachycardic. WBC is 20.7. Source of sepsis is cellulitic area on left lateral abdomen. Altered mental status upon presentation.  Celluliltis: The patient initially received oral clindamycin in the ED. This was later changed to IV vancomycin. PCR for MRSA is pending. Culture of fluid from "I&D" is pending. Blood culture has been obtained.  Cocaine abuse: The patient denied illicit drug use, but Kelli Jenkins was positive for cocaine on UDS.  Homelessness: This will likely complicate safe discharge for this patient.   I have seen and examined this patient myself. I have spent 73  minutes in her evaluation and admission.  DVT Prophylaxis: Lovenox CODE STATUS: Full Code Family Communication: None available Disposition: The patient has been admitted to an inpatient med/surg bed.  Status is: Inpatient  Remains inpatient appropriate because:IV treatments appropriate due to intensity of illness or inability to take PO   Dispo: The patient is from: Homeless              Anticipated d/c is to: Homeless              Anticipated d/c date is: 3 days              Patient currently is not medically stable to d/c.  Severity of Illness: The appropriate patient status for this patient is INPATIENT. Inpatient status is judged to be reasonable and necessary in order to provide the required intensity of service to ensure the patient's safety. The patient's presenting symptoms, physical exam findings, and initial radiographic and laboratory data in the context of their chronic comorbidities is felt to place them at high risk for further clinical deterioration. Furthermore, it is not anticipated that the patient will be medically stable for discharge from the hospital within 2 midnights of admission. The following factors support the patient status of inpatient.   " The patient's presenting symptoms include altered mental status. " The worrisome physical exam findings include Area of erythema, warmth, and induration on her left flank. " The initial radiographic and laboratory data are worrisome because of Thickened area of skin where erythema is.. " The chronic co-morbidities include Illicit drug [place].  * I certify that at the point of admission it is my clinical judgment that the patient will require inpatient hospital care spanning beyond 2 midnights from the point of admission due to high intensity of service, high risk for further deterioration and high frequency of surveillance required.*  Anshika Pethtel 01/22/2020, 10:33 PM

## 2020-01-22 NOTE — Progress Notes (Signed)
A consult was received from an ED physician for vancomycin per pharmacy dosing.  The patient's profile has been reviewed for ht/wt/allergies/indication/available labs.   A one time order has been placed for vancomycin 1gm    Further antibiotics/pharmacy consults should be ordered by admitting physician if indicated.                       Thank you, Dolly Rias RPh 01/22/2020, 1:13 PM

## 2020-01-22 NOTE — ED Notes (Signed)
Transport called to take pt to floor.  

## 2020-01-22 NOTE — ED Notes (Signed)
Kelli Jenkins, sister, would like an update, [phone].

## 2020-01-22 NOTE — ED Provider Notes (Signed)
Kelli Jenkins Provider Note   CSN: [id number] Arrival date & time: 01/22/20  0405     History Chief Complaint  Patient presents with  . Abscess    Kelli Jenkins is a 59 y.o. female with history of cocaine use presents to ER for evaluation of possible abscess.  She is homeless, for months. She slept on a person's cough in his backyard and the next morning noticed several lesions on her lower abdomen and legs. These are burning, tender.  Her friend googled them and put some salt on them and covered them with gauze and painter's tape.  Reports chills. Denies recent alcohol or drug use.  On review of systems she states "my breathing is bad", when asked what she means she says "they say I have COPD". However she denies CP, SOB, cough, sore throat, rhinorrhea. She denies vomiting, diarrhea. Denies dysuria. Of note, patient keeps her eyes closed during exam. States she is sleepy because she doesn't have a place to sleep.  Denies headache. Denies falls.  HPI     Past Medical History:  Diagnosis Date  . 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 Cocaine addiction ([place])   . Hypokalemia   . Nephrolithiasis   . Pyelonephritis   . SIRS (systemic inflammatory response syndrome) ([facility])   . Tobacco abuse   . UTI (urinary tract infection) 06/2013    Patient Active Problem List   Diagnosis Date Noted  . Cellulitis 01/22/2020  . Chest pain 06/08/2016  . Nausea & vomiting 06/08/2016  . Intractable vomiting with nausea   . CAP (community acquired pneumonia) 05/03/2016  . Normocytic anemia 05/03/2016  . Urinary tract infection without hematuria   . Opioid overdose ([place]) 01/21/2016  . Somnolence 01/21/2016  . Cocaine abuse ([place]) 01/21/2016  . Opioid abuse ([facility]) 01/21/2016  . UTI (urinary tract infection) 07/13/2013  . HCAP (healthcare-associated pneumonia) 08/16/2011  . Migraine 08/15/2011  . SIRS (systemic inflammatory response syndrome) ([place]) 08/13/2011  .  Pyelonephritis 08/12/2011  . Ureteral colic 25/36/6440  . Hypotension 08/12/2011  . Fever 08/12/2011  . Nephrolithiasis 08/12/2011  . Leukocytosis 08/12/2011  . Tobacco use disorder 08/12/2011    Past Surgical History:  Procedure Laterality Date  . ESOPHAGOGASTRODUODENOSCOPY N/A 09/22/2016   Procedure: ESOPHAGOGASTRODUODENOSCOPY (EGD);  Surgeon: Ronald Lobo, MD;  Location: [facility];  Service: Endoscopy;  Laterality: N/A;  . RENAL ARTERY STENT     stones  . TUBAL LIGATION    . VAGINAL HYSTERECTOMY       OB History   No obstetric history on file.     No family history on file.  Social History   Tobacco Use  . Smoking status: Current Every Day Smoker    Packs/day: 1.00    Types: Cigarettes  . Smokeless tobacco: Never Used  Substance Use Topics  . Alcohol use: Yes    Comment: occasionally  . Drug use: Yes    Types: Cocaine    Comment: Patient states she is currently smoking crack but does not know what else is in it     Home Medications Prior to Admission medications   Medication Sig Start Date End Date Taking? Authorizing Provider  aspirin-acetaminophen-caffeine (EXCEDRIN MIGRAINE) [phone] MG tablet Take 3 tablets by mouth every 6 (six) hours as needed for headache.   Yes [provider]  levofloxacin (LEVAQUIN) 500 MG tablet Take 1 tablet (500 mg total) by mouth daily. Patient not taking: Reported on 01/22/2020 09/07/19   Horton, Loma Sousa  F, MD  methocarbamol (ROBAXIN) 500 MG tablet Take 1 tablet (500 mg total) by mouth 2 (two) times daily. Patient not taking: Reported on 06/18/2019 01/11/18   Suella Broad A, PA-C  ondansetron (ZOFRAN ODT) 4 MG disintegrating tablet Take 1 tablet (4 mg total) by mouth every 8 (eight) hours as needed. Patient not taking: Reported on 01/22/2020 09/07/19   Horton, Barbette Hair, MD  pantoprazole (PROTONIX) 40 MG tablet Take 1 tablet (40 mg total) by mouth daily. Patient not taking: Reported on 06/18/2019 06/10/16   Alphonzo Grieve, MD  promethazine (PHENERGAN) 12.5 MG tablet Take 1 tablet (12.5 mg total) by mouth every 6 (six) hours as needed for nausea or vomiting. Patient not taking: Reported on 06/18/2019 06/09/16   Alphonzo Grieve, MD    Allergies    Darvocet [propoxyphene n-acetaminophen], Ibuprofen, and Tramadol  Review of Systems   Review of Systems  Constitutional: Positive for chills.  Skin: Positive for color change and wound.  All other systems reviewed and are negative.   Physical Exam Updated Vital Signs BP (!) 118/47 (BP Location: Right Arm)   Pulse 100   Temp (!) 100.5 F (38.1 C) (Oral)   Resp 20   SpO2 95%   Physical Exam Vitals and nursing note reviewed.  Constitutional:      Appearance: She is well-developed.     Comments: No distress however patient looks sleepy.  She keeps her eyes closed during exam.  She is redirectable.  HENT:     Head: Normocephalic and atraumatic.     Nose: Nose normal.  Eyes:     Conjunctiva/sclera: Conjunctivae normal.  Cardiovascular:     Rate and Rhythm: Normal rate and regular rhythm.  Pulmonary:     Effort: Pulmonary effort is normal.     Breath sounds: Normal breath sounds.  Abdominal:     General: Bowel sounds are normal.     Palpations: Abdomen is soft.     Tenderness: There is abdominal tenderness.     Comments: Several pustular erythematous lesions across the abdomen with central scabbing, see picture below.  Largest area of erythema is in the left lower abdomen with central fluctuance.  Musculoskeletal:        General: Normal range of motion.     Cervical back: Normal range of motion.  Skin:    General: Skin is warm and dry.     Capillary Refill: Capillary refill takes less than 2 seconds.     Comments: Several ulcerated/pustular lesions across the abdomen and lower legs as pictured below.  Largest area of cellulitis is in the left lower abdomen with central scabbing and fluctuance.  No skin abnormalities in between the fingers and  toes.  Neurological:     Mental Status: She is alert.  Psychiatric:        Behavior: Behavior normal.           ED Results / Procedures / Treatments   Labs (all labs ordered are listed, but only abnormal results are displayed) Labs Reviewed  CBC WITH DIFFERENTIAL/PLATELET - Abnormal; Notable for the following components:      Result Value   WBC 20.7 (*)    Neutro Abs 18.4 (*)    Monocytes Absolute 1.1 (*)    Abs Immature Granulocytes 0.10 (*)    All other components within normal limits  BASIC METABOLIC PANEL - Abnormal; Notable for the following components:   Glucose, Bld 118 (*)    Calcium 8.1 (*)  All other components within normal limits  RAPID URINE DRUG SCREEN, HOSP PERFORMED - Abnormal; Notable for the following components:   Cocaine POSITIVE (*)    All other components within normal limits  HEPATIC FUNCTION PANEL - Abnormal; Notable for the following components:   Total Protein 6.0 (*)    Albumin 3.0 (*)    All other components within normal limits  RESPIRATORY PANEL BY RT PCR (FLU A&B, COVID)  URINE CULTURE  CULTURE, BLOOD (SINGLE)  AEROBIC CULTURE (SUPERFICIAL SPECIMEN)  LACTIC ACID, PLASMA  LACTIC ACID, PLASMA  URINALYSIS, ROUTINE W REFLEX MICROSCOPIC  PROTIME-INR  APTT  ETHANOL  HIV ANTIBODY (ROUTINE TESTING W REFLEX)  COMPREHENSIVE METABOLIC PANEL  CBC    EKG EKG Interpretation  Date/Time:  Friday January 22 2020 08:21:10 EDT Ventricular Rate:  103 PR Interval:    QRS Duration: 80 QT Interval:  340 QTC Calculation: 445 R Axis:   88 Text Interpretation: Sinus tachycardia Borderline right axis deviation Confirmed by Quintella Reichert [phone]) on 01/22/2020 9:08:06 AM   Radiology DG Chest 2 View  Result Date: 01/22/2020 CLINICAL DATA:  Shortness of breath, COPD. EXAM: CHEST - 2 VIEW COMPARISON:  Sep 07, 2019. FINDINGS: The heart size and mediastinal contours are within normal limits. Both lungs are clear. No pneumothorax or pleural effusion  is noted. The visualized skeletal structures are unremarkable. IMPRESSION: No active cardiopulmonary disease. Electronically Signed   By: Marijo Conception M.D.   On: 01/22/2020 08:36   CT Abdomen Pelvis W Contrast  Result Date: 01/22/2020 CLINICAL DATA:  Abdominal pain and fever. Four days post hysterectomy EXAM: CT ABDOMEN AND PELVIS WITH CONTRAST TECHNIQUE: Multidetector CT imaging of the abdomen and pelvis was performed using the standard protocol following bolus administration of intravenous contrast. CONTRAST:  167mL OMNIPAQUE IOHEXOL 300 MG/ML  SOLN COMPARISON:  CT abdomen and pelvis June 18, 2019 FINDINGS: Lower chest: There is bibasilar atelectasis. Hepatobiliary: There is hepatic steatosis. No focal liver lesions are appreciable. Gallbladder wall is not appreciably thickened. There is no evident biliary duct dilatation. Pancreas: There is no pancreatic mass or inflammatory focus. Spleen: No splenic lesions are evident. Adrenals/Urinary Tract: Adrenals appear normal bilaterally. There is a 7 x 7 mm cyst in the lower pole left kidney. There is no hydronephrosis on either side. There is a 1 mm calculus in the lower pole of the right kidney. There is no appreciable ureteral calculus on either side. The urinary bladder is midline with wall thickness within normal limits. Stomach/Bowel: There is no appreciable bowel wall or mesenteric thickening. There is no demonstrable bowel obstruction. The terminal ileum appears normal. There is no appreciable free air or portal venous air. Vascular/Lymphatic: There is no abdominal aortic aneurysm. There are foci of aortic and iliac artery atherosclerosis. Major venous structures appear patent. No adenopathy is appreciable in the abdomen or pelvis. Reproductive: Uterus absent.  No pelvic mass evident. Other: Appendix appears normal. No abscess or ascites is evident in the abdomen or pelvis. There is soft tissue stranding and thickening in the left lateral lower  abdominal/upper pelvic wall region. There is minimal fat in the umbilicus. Musculoskeletal: There are foci of degenerative change in the lumbar spine. No blastic or lytic bone lesions. No intramuscular lesions are evident. IMPRESSION: 1. Lateral upper pelvic wall soft tissue thickening and stranding. Question traumatic etiology for this appearance. No well-defined fluid collection or abscess in this area. 2. No abnormal fluid or abscess in the abdomen or pelvis. Appendix appears normal. No bowel  wall thickening or bowel obstruction. 3. 1 mm nonobstructing calculus lower pole right kidney. No hydronephrosis on either side. No ureteral calculus on either side. Urinary bladder wall thickness normal. 4.  Hepatic steatosis. 5.  Uterus absent. 6.  Aortic Atherosclerosis (ICD10-I70.0). Electronically Signed   By: Lowella Grip III M.D.   On: 01/22/2020 11:49    Procedures Procedures (including critical care time)  Medications Ordered in ED Medications  enoxaparin (LOVENOX) injection 40 mg (has no administration in time range)  acetaminophen (TYLENOL) tablet 650 mg (650 mg Oral Given 01/22/20 1749)    Or  acetaminophen (TYLENOL) suppository 650 mg ( Rectal See Alternative 01/22/20 1749)  vancomycin (VANCOREADY) IVPB 1500 mg/300 mL (has no administration in time range)  lidocaine-EPINEPHrine (XYLOCAINE W/EPI) 2 %-1:200000 (PF) injection 10 mL (10 mLs Infiltration Given by Other 01/22/20 0853)  Tdap (BOOSTRIX) injection 0.5 mL (0.5 mLs Intramuscular Given 01/22/20 0852)  sodium chloride 0.9 % bolus 1,000 mL (0 mLs Intravenous Stopped 01/22/20 1013)  clindamycin (CLEOCIN) capsule 450 mg (450 mg Oral Given 01/22/20 0900)  lactated ringers bolus 1,000 mL (0 mLs Intravenous Stopped 01/22/20 1240)  acetaminophen (TYLENOL) tablet 650 mg (650 mg Oral Given 01/22/20 1048)  iohexol (OMNIPAQUE) 300 MG/ML solution 100 mL (100 mLs Intravenous Contrast Given 01/22/20 1125)  sodium chloride (PF) 0.9 % injection (  Given by  Other 01/22/20 1139)  lactated ringers bolus 1,000 mL (0 mLs Intravenous Stopped 01/22/20 1506)  vancomycin (VANCOCIN) IVPB 1000 mg/200 mL premix ( Intravenous Stopped 01/22/20 1340)    ED Course  I have reviewed the triage vital signs and the nursing notes.  Pertinent labs & imaging results that were available during my care of the patient were reviewed by me and considered in my medical decision making (see chart for details).  Clinical Course as of Jan 22 2024  Fri Jan 22, 2020  1021 Temp(!): 101.7 F (38.7 C) [CG]  1021 Resp(!): 28 [CG]  1021 WBC(!): 20.7 [CG]  1153 1. Lateral upper pelvic wall soft tissue thickening and stranding. Question traumatic etiology for this appearance. No well-defined fluid collection or abscess in this area.  2. No abnormal fluid or abscess in the abdomen or pelvis. Appendix appears normal. No bowel wall thickening or bowel obstruction.  3. 1 mm nonobstructing calculus lower pole right kidney. No hydronephrosis on either side. No ureteral calculus on either side. Urinary bladder wall thickness normal.  4. Hepatic steatosis.  5. Uterus absent.  6. Aortic Atherosclerosis (ICD10-I70.0).    CT Abdomen Pelvis W Contrast [CG]  1326 Dr Benny Lennert to admit    [CG]    Clinical Course User Index [CG] Arlean Hopping   MDM Rules/Calculators/A&P                         EMR triage and nursing notes reviewed to assist with MDM and obtain more history.   Patient meets SIRS/sepsis criteria with oral fever 101.5 F, tachypnea. Soft BP initially in low 100s but transient hypotension with SBP 70-90s.   Highest on ddx is cellulitis vs superficial abscess in left lower abdominal wall. She reports vague "not breathing good" but denies URI symptoms, CP.   I have ordered labs work in sepsis order set including blood cultures, lactic acid.    Patient shared with EDP who evaluated patient - ordered CTAP given diffuse abdominal tenderness, unreliable  exam, ? Intoxication.   ER work up personally reviewed and interpreted.   Leukocytosis  WBC 20, normal lactic acid. Creatinine normal. CXR without ACP process. UA negative. UDS positive for cocaine. CTAP shows left abdominal wall stranding correlating with clinical exam.   ER work up supports SIRS/sepsis from cellulitis. CTAP shows left abdominal wall soft tissue inflammation, no focal intraabdominal or pelvic abscess.    I&D performed by me of left abdominal wall abscess, moderate amount of mostly bloody/purulent fluid obtained, wound culture sent.   Ordered IVF 2 L, vancomycin, tylenol.   Patient re-evaluated and SBP improving. Continues to report chills but overall stable.   Seen by Dr Benny Lennert in ER, will admit  Final Clinical Impression(s) / ED Diagnoses Final diagnoses:  SIRS (systemic inflammatory response syndrome) [facility])    Rx / DC Orders ED Discharge Orders    None       Arlean Hopping 01/22/20 2025    Quintella Reichert, MD 01/23/20 [phone]

## 2020-01-22 NOTE — ED Triage Notes (Addendum)
Patient here from home reporting abcess to right hip and spread "all over". States they are "wrap and don't remove it".

## 2020-01-23 DIAGNOSIS — L03311 Cellulitis of abdominal wall: Secondary | ICD-10-CM

## 2020-01-23 DIAGNOSIS — F17200 Nicotine dependence, unspecified, uncomplicated: Secondary | ICD-10-CM

## 2020-01-23 DIAGNOSIS — D72825 Bandemia: Secondary | ICD-10-CM

## 2020-01-23 DIAGNOSIS — F1410 Cocaine abuse, uncomplicated: Secondary | ICD-10-CM

## 2020-01-23 DIAGNOSIS — F1920 Other psychoactive substance dependence, uncomplicated: Secondary | ICD-10-CM

## 2020-01-23 DIAGNOSIS — Z5900 Homelessness unspecified: Secondary | ICD-10-CM

## 2020-01-23 LAB — URINE CULTURE: Culture: <10000 — AB

## 2020-01-23 LAB — COMPREHENSIVE METABOLIC PANEL
ALT: 11 U/L (ref 0–44)
AST: 12 U/L — ABNORMAL LOW (ref 15–41)
Albumin: 3 g/dL — ABNORMAL LOW (ref 3.5–5.0)
Alkaline Phosphatase: 57 U/L (ref 38–126)
Anion gap: 9 (ref 5–15)
BUN: 11 mg/dL (ref 6–20)
CO2: 25 mmol/L (ref 22–32)
Calcium: 8.5 mg/dL — ABNORMAL LOW (ref 8.9–10.3)
Chloride: 105 mmol/L (ref 98–111)
Creatinine, Ser: 1.03 mg/dL — ABNORMAL HIGH (ref 0.44–1.00)
GFR calc Af Amer: >60 mL/min (ref >60)
GFR calc non Af Amer: 59 mL/min — ABNORMAL LOW (ref >60)
Glucose, Bld: 107 mg/dL — ABNORMAL HIGH (ref 70–99)
Potassium: 4.4 mmol/L (ref 3.5–5.1)
Sodium: 139 mmol/L (ref 135–145)
Total Bilirubin: 0.8 mg/dL (ref 0.3–1.2)
Total Protein: 6.1 g/dL — ABNORMAL LOW (ref 6.5–8.1)

## 2020-01-23 LAB — CBC
HCT: 39.4 % (ref 36.0–46.0)
Hemoglobin: 12.6 g/dL (ref 12.0–15.0)
MCH: 31.9 pg (ref 26.0–34.0)
MCHC: 32 g/dL (ref 30.0–36.0)
MCV: 99.7 fL (ref 80.0–100.0)
Platelets: 254 10*3/uL (ref 150–400)
RBC: 3.95 MIL/uL (ref 3.87–5.11)
RDW: 13.4 % (ref 11.5–15.5)
WBC: 18.2 10*3/uL — ABNORMAL HIGH (ref 4.0–10.5)
nRBC: 0 % (ref 0.0–0.2)

## 2020-01-23 LAB — LACTIC ACID, PLASMA
Lactic Acid, Venous: 1.8 mmol/L (ref 0.5–1.9)
Lactic Acid, Venous: 1.8 mmol/L (ref 0.5–1.9)

## 2020-01-23 LAB — CORTISOL: Cortisol, Plasma: 6.2 ug/dL

## 2020-01-23 MED ORDER — LACTATED RINGERS IV SOLN: INTRAVENOUS | Status: AC

## 2020-01-23 MED ORDER — ACETAMINOPHEN 500 MG PO TABS
1000.0000 mg | ORAL_TABLET | Freq: Three times a day (TID) | ORAL | Status: DC
  Administered 2020-01-23 – 2020-01-27 (×13): 1000 mg via ORAL
  Filled 2020-01-23 (×14): qty 2

## 2020-01-23 MED ORDER — LACTATED RINGERS IV BOLUS
1000.0000 mL | Freq: Once | INTRAVENOUS | Status: AC
  Administered 2020-01-23: 1000 mL via INTRAVENOUS

## 2020-01-23 MED ORDER — NICOTINE 21 MG/24HR TD PT24
21.0000 mg | MEDICATED_PATCH | Freq: Every day | TRANSDERMAL | Status: DC
  Administered 2020-01-25 – 2020-01-27 (×3): 21 mg via TRANSDERMAL
  Filled 2020-01-23 (×4): qty 1

## 2020-01-23 MED ORDER — ENOXAPARIN SODIUM 60 MG/0.6ML ~~LOC~~ SOLN
0.5000 mg/kg | SUBCUTANEOUS | Status: DC
  Administered 2020-01-23 – 2020-01-26 (×4): 50 mg via SUBCUTANEOUS
  Filled 2020-01-23 (×4): qty 0.6

## 2020-01-23 NOTE — Progress Notes (Signed)
Bolus given. BP: 108/63 and MAP: 77

## 2020-01-23 NOTE — Progress Notes (Signed)
Pt's blood pressure was 96/35 and MAP was 52. MD notified and bolus ordered.

## 2020-01-23 NOTE — Progress Notes (Signed)
PROGRESS NOTE  Kelli Jenkins HTD:[id number] DOB: 01-10-1961   PCP: Patient, No Pcp Per  Patient is from: Homeless. Sleeps in her friends backyard  DOA: 01/22/2020 LOS: 1  Brief Narrative / Interim history: 59 year old homeless female with history of polysubstance use (cocaine, marijuana and tobacco) presenting with fever, chills, skin redness and pain over left lateral abdominal wall, and admitted for severe sepsis due to left lateral abdominal wall cellulitis.   In ED, she was febrile, tachycardic, tachypneic and encephalopathic.  WBC 20.7 with left shift.  Lactic acid normal.  UA without significant finding.  Influenza and COVID-19 PCR negative.  UDS positive for cocaine.  EDP trained bedside I&D.  Blood and tissue culture obtained.  She was a started on IV vancomycin.  Subjective: Seen and examined earlier this morning.  She reports significant pain over LLQ.  Described the pain as throbbing.  She rates the pain 8/10.  She reluctantly admits to using cocaine and marijuana about 4 to 5 days prior to this admission.  She says she used cocaine due to pain.  Denies drinking alcohol.  She denies nausea, vomiting, or UTI symptoms.  Objective: Vitals:   01/23/20 0610 01/23/20 0610 01/23/20 0930 01/23/20 1000  BP: (!) 104/58 (!) 104/58 124/70   Pulse: 76 79 87   Resp: _0 Temp: 98.1 F (36.7 C) 98.1 F (36.7 C) 98.4 F (36.9 C)   TempSrc: Oral Oral Oral   SpO2: 97% 97% 99%   Weight:    99.8 kg  Height:        Intake/Output Summary (Last 24 hours) at 01/23/2020 1259 Last data filed at 01/23/2020 1057 Gross per 24 hour  Intake 2460.32 ml  Output --  Net 2460.32 ml   Filed Weights   01/23/20 1000  Weight: 99.8 kg    Examination:  GENERAL: No apparent distress.  Nontoxic. HEENT: MMM.  Vision and hearing grossly intact.  NECK: Supple.  No apparent JVD.  RESP:  No IWOB.  Fair aeration bilaterally. CVS:  RRR. Heart sounds normal.  ABD/GI/GU: BS+. Abd soft.  Areas  of erythematous skin lesion over LLQ with central ulceration.  Tender and warm to touch.  Other scattered erythematous lesion on the right side.  See picture below for more. MSK/EXT:  Moves extremities. No apparent deformity. No edema.  SKIN: Abdominal skin lesion as above. NEURO: Awake, alert and oriented appropriately.  No apparent focal neuro deficit. PSYCH: Calm. Normal affect.           Procedures:  01/22/2020-bedside I&D by EDP  Microbiology summarized: COVID-19 PCR negative. Influenza PCR negative. Superficial wound culture with rare staph aureus Blood culture NGTD. Urine culture with insignificant growth.  Assessment & Plan: Severe sepsis due to abdominal wall cellulitis-POA.  Patient met criteria with fever, tachycardia, tachypnea, leukocytosis, hypotension, encephalopathy and known source of infection as above.  Sepsis physiology improving.  Erythema seems to be worse today.  Superficial wound culture with staph aureus.  Blood culture NGTD. -Continue IV vancomycin -Follow culture sensitivities -Scheduled Tylenol for pain control  Hypotension: Likely due to the above.  Improved. -IV LR at 75 cc an hour for the next 24 hours  Leukocytosis/bandemia: Likely due to the above.  Improved. -Continue monitoring  Polysubstance use: Reports smoking about a pack a day. Reluctantly admits to cocaine and marijuana use.  UDS positive for cocaine. -Counseled -Nicotine patch -TOC consulted for resources.  Homelessness-sleeps in friend's backyard -TOC consulted  No PCP -TOC consulted  Body mass index is 33.45 kg/m.         DVT prophylaxis:  enoxaparin (LOVENOX) injection 40 mg Start: 01/22/20 2200  Code Status: Full code Family Communication: Patient and/or RN. Available if any question.  Status is: Inpatient  Remains inpatient appropriate because:Hemodynamically unstable, IV treatments appropriate due to intensity of illness or inability to take PO and Inpatient  level of care appropriate due to severity of illness   Dispo: The patient is from: Homeless              Anticipated d/c is to: To be determined              Anticipated d/c date is: 2 days              Patient currently is not medically stable to d/c.       Consultants:  None   Sch Meds:  Scheduled Meds: . acetaminophen  1,000 mg Oral Q8H  . enoxaparin (LOVENOX) injection  40 mg Subcutaneous Q24H  . nicotine  21 mg Transdermal Daily   Continuous Infusions: . lactated ringers 75 mL/hr at 01/23/20 1055  . vancomycin Stopped (01/23/20 0935)   PRN Meds:.  Antimicrobials: Anti-infectives (From admission, onward)   Start     Dose/Rate Route Frequency Ordered Stop   01/22/20 2200  vancomycin (VANCOREADY) IVPB 1500 mg/300 mL        1,500 mg 150 mL/hr over 120 Minutes Intravenous Every 24 hours 01/22/20 1934     01/22/20 1730  vancomycin (VANCOCIN) IVPB 1000 mg/200 mL premix  Status:  Discontinued        1,000 mg 200 mL/hr over 60 Minutes Intravenous  Once 01/22/20 1716 01/22/20 1934   01/22/20 1215  vancomycin (VANCOCIN) IVPB 1000 mg/200 mL premix        1,000 mg 200 mL/hr over 60 Minutes Intravenous  Once 01/22/20 1212 01/22/20 1340   01/22/20 0845  clindamycin (CLEOCIN) capsule 450 mg        450 mg Oral  Once 01/22/20 0840 01/22/20 0900       I have personally reviewed the following labs and images: CBC: Recent Labs  Lab 01/22/20 0850 01/23/20 0502  WBC 20.7* 18.2*  NEUTROABS 18.4*  --   HGB 13.9 12.6  HCT 41.5 39.4  MCV 95.4 99.7  PLT 254 254   BMP &GFR Recent Labs  Lab 01/22/20 1019 01/23/20 0502  NA 135 139  K 4.0 4.4  CL 102 105  CO2 23 25  GLUCOSE 118* 107*  BUN 11 11  CREATININE 0.99 1.03*  CALCIUM 8.1* 8.5*   Estimated Creatinine Clearance: 71.8 mL/min (A) (by C-G formula based on SCr of 1.03 mg/dL (H)). Liver & Pancreas: Recent Labs  Lab 01/22/20 1049 01/23/20 0502  AST 15 12*  ALT 13 11  ALKPHOS 59 57  BILITOT 0.7 0.8  PROT 6.0*  6.1*  ALBUMIN 3.0* 3.0*   No results for input(s): LIPASE, AMYLASE in the last 168 hours. No results for input(s): AMMONIA in the last 168 hours. Diabetic: No results for input(s): HGBA1C in the last 72 hours. No results for input(s): GLUCAP in the last 168 hours. Cardiac Enzymes: No results for input(s): CKTOTAL, CKMB, CKMBINDEX, TROPONINI in the last 168 hours. No results for input(s): PROBNP in the last 8760 hours. Coagulation Profile: Recent Labs  Lab 01/22/20 1049  INR 1.0   Thyroid Function Tests: No results for input(s): TSH, T4TOTAL, FREET4, T3FREE, THYROIDAB in the last 72 hours. Lipid  Profile: No results for input(s): CHOL, HDL, LDLCALC, TRIG, CHOLHDL, LDLDIRECT in the last 72 hours. Anemia Panel: No results for input(s): VITAMINB12, FOLATE, FERRITIN, TIBC, IRON, RETICCTPCT in the last 72 hours. Urine analysis:    Component Value Date/Time   COLORURINE YELLOW 01/22/2020 1029   APPEARANCEUR CLEAR 01/22/2020 1029   LABSPEC 1.015 01/22/2020 1029   PHURINE 8.0 01/22/2020 1029   GLUCOSEU NEGATIVE 01/22/2020 1029   HGBUR NEGATIVE 01/22/2020 [place] 01/22/2020 1029   KETONESUR NEGATIVE 01/22/2020 1029   PROTEINUR NEGATIVE 01/22/2020 1029   UROBILINOGEN 1.0 03/04/2015 1904   NITRITE NEGATIVE 01/22/2020 [place] 01/22/2020 1029   Sepsis Labs: Invalid input(s): PROCALCITONIN, [place]  Microbiology: Recent Results (from the past 240 hour(s))  Respiratory Panel by RT PCR (Flu A&B, Covid) - Nasopharyngeal Swab     Status: None   Collection Time: 01/22/20 10:19 AM   Specimen: Nasopharyngeal Swab  Result Value Ref Range Status   SARS Coronavirus 2 by RT PCR NEGATIVE NEGATIVE Final    Comment: (NOTE) SARS-CoV-2 target nucleic acids are NOT DETECTED.  The SARS-CoV-2 RNA is generally detectable in upper respiratoy specimens during the acute phase of infection. The lowest concentration of SARS-CoV-2 viral copies this assay can  detect is 131 copies/mL. A negative result does not preclude SARS-Cov-2 infection and should not be used as the sole basis for treatment or other patient management decisions. A negative result may occur with  improper specimen collection/handling, submission of specimen other than nasopharyngeal swab, presence of viral mutation(s) within the areas targeted by this assay, and inadequate number of viral copies (<131 copies/mL). A negative result must be combined with clinical observations, patient history, and epidemiological information. The expected result is Negative.  Fact Sheet for Patients:  PinkCheek.be  Fact Sheet for Healthcare Providers:  GravelBags.it  This test is no t yet approved or cleared by the [place] FDA and  has been authorized for detection and/or diagnosis of SARS-CoV-2 by FDA under an Emergency Use Authorization (EUA). This EUA will remain  in effect (meaning this test can be used) for the duration of the COVID-19 declaration under Section 564(b)(1) of the Act, 21 U.S.C. section 360bbb-3(b)(1), unless the authorization is terminated or revoked sooner.     Influenza A by PCR NEGATIVE NEGATIVE Final   Influenza B by PCR NEGATIVE NEGATIVE Final    Comment: (NOTE) The Xpert Xpress SARS-CoV-2/FLU/RSV assay is intended as an aid in  the diagnosis of influenza from Nasopharyngeal swab specimens and  should not be used as a sole basis for treatment. Nasal washings and  aspirates are unacceptable for Xpert Xpress SARS-CoV-2/FLU/RSV  testing.  Fact Sheet for Patients: PinkCheek.be  Fact Sheet for Healthcare Providers: GravelBags.it  This test is not yet approved or cleared by the [place] FDA and  has been authorized for detection and/or diagnosis of SARS-CoV-2 by  FDA under an Emergency Use Authorization (EUA). This EUA will remain  in  effect (meaning this test can be used) for the duration of the  Covid-19 declaration under Section 564(b)(1) of the Act, 21  U.S.C. section 360bbb-3(b)(1), unless the authorization is  terminated or revoked. Performed at [facility], [place] [address], [place], [place] [address]   Urine culture     Status: Abnormal   Collection Time: 01/22/20 10:29 AM   Specimen: Urine, Clean Catch  Result Value Ref Range Status   Specimen Description   Final    URINE, CLEAN CATCH  Performed at [facility], [place] [address] [place], [place] [address]    Special Requests   Final    NONE Performed at [facility], [place] [address], [place], [place] [address]    Culture (A)  Final    <10,000 COLONIES/mL INSIGNIFICANT GROWTH Performed at [facility] [address], [place], [place] [address]    Report Status 01/23/2020 FINAL  Final  Blood culture (routine single)     Status: None (Preliminary result)   Collection Time: 01/22/20 10:49 AM   Specimen: BLOOD  Result Value Ref Range Status   Specimen Description   Final    BLOOD BLOOD LEFT FOREARM Performed at [facility] [address], [place], [place] [address]    Special Requests   Final    BOTTLES DRAWN AEROBIC AND ANAEROBIC Blood Culture adequate volume Performed at [facility] [address], [place], [place] [address]    Culture   Final    NO GROWTH < 24 HOURS Performed at [facility] [address] [place], [place] [address]    Report Status PENDING  Incomplete  Wound or Superficial Culture     Status: None (Preliminary result)   Collection Time: 01/22/20  3:22 PM   Specimen: Abscess; Wound  Result Value Ref Range Status   Specimen Description   Final    ABSCESS Performed at [facility] [address], [place], [place] [address]    Special Requests   Final    NONE Performed at [facility], [place] [address], [place], [place] [address]    Gram Stain   Final    MODERATE WBC PRESENT, PREDOMINANTLY PMN FEW GRAM POSITIVE COCCI    Culture   Final    RARE STAPHYLOCOCCUS AUREUS SUSCEPTIBILITIES TO FOLLOW Performed at [facility], [place] [address], [place], [place] [address]    Report Status PENDING  Incomplete    Radiology Studies: No results found.   Kelli Jenkins T. [place]  If 7PM-7AM, please contact night-coverage [email or web address] 01/23/2020, 12:59 PM

## 2020-01-24 DIAGNOSIS — R112 Nausea with vomiting, unspecified: Secondary | ICD-10-CM

## 2020-01-24 DIAGNOSIS — R42 Dizziness and giddiness: Secondary | ICD-10-CM

## 2020-01-24 LAB — AEROBIC CULTURE W GRAM STAIN (SUPERFICIAL SPECIMEN)

## 2020-01-24 LAB — RENAL FUNCTION PANEL
Albumin: 2.9 g/dL — ABNORMAL LOW (ref 3.5–5.0)
Anion gap: 8 (ref 5–15)
BUN: 10 mg/dL (ref 6–20)
CO2: 23 mmol/L (ref 22–32)
Calcium: 8.4 mg/dL — ABNORMAL LOW (ref 8.9–10.3)
Chloride: 106 mmol/L (ref 98–111)
Creatinine, Ser: 0.8 mg/dL (ref 0.44–1.00)
GFR calc Af Amer: >60 mL/min (ref >60)
GFR calc non Af Amer: >60 mL/min (ref >60)
Glucose, Bld: 119 mg/dL — ABNORMAL HIGH (ref 70–99)
Phosphorus: 3.9 mg/dL (ref 2.5–4.6)
Potassium: 3.9 mmol/L (ref 3.5–5.1)
Sodium: 137 mmol/L (ref 135–145)

## 2020-01-24 LAB — MAGNESIUM: Magnesium: 2.1 mg/dL (ref 1.7–2.4)

## 2020-01-24 LAB — CBC
HCT: 37.3 % (ref 36.0–46.0)
Hemoglobin: 12.3 g/dL (ref 12.0–15.0)
MCH: 31.5 pg (ref 26.0–34.0)
MCHC: 33 g/dL (ref 30.0–36.0)
MCV: 95.6 fL (ref 80.0–100.0)
Platelets: 242 10*3/uL (ref 150–400)
RBC: 3.9 MIL/uL (ref 3.87–5.11)
RDW: 13 % (ref 11.5–15.5)
WBC: 10.9 10*3/uL — ABNORMAL HIGH (ref 4.0–10.5)
nRBC: 0 % (ref 0.0–0.2)

## 2020-01-24 LAB — CORTISOL-AM, BLOOD: Cortisol - AM: 7.5 ug/dL (ref 6.7–22.6)

## 2020-01-24 MED ORDER — HYDROCORTISONE 1 % EX CREA
TOPICAL_CREAM | Freq: Three times a day (TID) | CUTANEOUS | Status: AC
  Administered 2020-01-24 – 2020-01-25 (×2): 1 via TOPICAL
  Filled 2020-01-24: qty 28

## 2020-01-24 MED ORDER — LACTATED RINGERS IV SOLN: INTRAVENOUS | Status: AC

## 2020-01-24 MED ORDER — ONDANSETRON HCL 4 MG/2ML IJ SOLN
4.0000 mg | Freq: Three times a day (TID) | INTRAMUSCULAR | Status: DC | PRN
  Administered 2020-01-24 – 2020-01-25 (×2): 4 mg via INTRAVENOUS
  Filled 2020-01-24 (×2): qty 2

## 2020-01-24 MED ORDER — SODIUM CHLORIDE 0.9 % IV SOLN
100.0000 mg | Freq: Two times a day (BID) | INTRAVENOUS | Status: DC
  Administered 2020-01-24 – 2020-01-27 (×7): 100 mg via INTRAVENOUS
  Filled 2020-01-24 (×7): qty 100

## 2020-01-24 NOTE — Evaluation (Signed)
Physical Therapy Evaluation Patient Details Name: Kelli Jenkins MRN: [id number] DOB: 1960/05/10 Today's Date: 01/24/2020   History of Present Illness  59 yo homeless female with history of polysubstance use (cocaine, marijuana and tobacco) presenting with fever, chills, skin redness and pain over left lateral abdominal wall, and admitted for severe sepsis due to left lateral abdominal wall cellulitis.  Clinical Impression  Patient evaluated by Physical Therapy with no further acute PT needs identified. All education has been completed and the patient has no further questions.  See below for any follow-up Physical Therapy or equipment needs. PT is signing off. Thank you for this referral.     Follow Up Recommendations No PT follow up    Equipment Recommendations  None recommended by PT    Recommendations for Other Services       Precautions / Restrictions Precautions Precautions: Fall Restrictions Weight Bearing Restrictions: No      Mobility  Bed Mobility Overal bed mobility: Independent                Transfers Overall transfer level: Independent                  Ambulation/Gait Ambulation/Gait assistance: Independent Gait Distance (Feet): 360 Feet Assistive device: None Gait Pattern/deviations: WFL(Within Functional Limits)   Gait velocity interpretation: >4.37 ft/sec, indicative of normal walking speed    Stairs            Wheelchair Mobility    Modified Rankin (Stroke Patients Only)       Balance Overall balance assessment: No apparent balance deficits (not formally assessed)                                           Pertinent Vitals/Pain Pain Assessment: Faces Faces Pain Scale: Hurts whole lot Pain Location: abd Pain Descriptors / Indicators: Grimacing;Sore;Throbbing Pain Intervention(s): Limited activity within patient's tolerance;Monitored during session;Repositioned    Home Living Family/patient  expects to be discharged to:: Shelter/Homeless                 Additional Comments: pt reports she does not stay in any certain area, homeless shelters are ususally full    Prior Function Level of Independence: Independent               Hand Dominance        Extremity/Trunk Assessment   Upper Extremity Assessment Upper Extremity Assessment: Overall WFL for tasks assessed    Lower Extremity Assessment Lower Extremity Assessment: Overall WFL for tasks assessed       Communication   Communication: No difficulties  Cognition Arousal/Alertness: Awake/alert Behavior During Therapy: WFL for tasks assessed/performed Overall Cognitive Status: Within Functional Limits for tasks assessed                                        General Comments      Exercises     Assessment/Plan    PT Assessment Patent does not need any further PT services  PT Problem List         PT Treatment Interventions      PT Goals (Current goals can be found in the Care Plan section)  Acute Rehab PT Goals Patient Stated Goal: have less pain and get rid of infection PT Goal Formulation:  All assessment and education complete, DC therapy    Frequency     Barriers to discharge        Co-evaluation               AM-PAC PT "6 Clicks" Mobility  Outcome Measure Help needed turning from your back to your side while in a flat bed without using bedrails?: None Help needed moving from lying on your back to sitting on the side of a flat bed without using bedrails?: None Help needed moving to and from a bed to a chair (including a wheelchair)?: None Help needed standing up from a chair using your arms (e.g., wheelchair or bedside chair)?: None Help needed to walk in hospital room?: None Help needed climbing 3-5 steps with a railing? : None 6 Click Score: 24    End of Session Equipment Utilized During Treatment: Gait belt Activity Tolerance: Patient tolerated  treatment well Patient left: with call bell/phone within reach;in bed;with bed alarm set   PT Visit Diagnosis: Pain Pain - part of body:  (abd)    Time: 1660-6301 PT Time Calculation (min) (ACUTE ONLY): 14 min   Charges:   PT Evaluation $PT Eval Low Complexity: [place], PT  Acute Rehab Dept ([place]) [phone] Pager [phone]  01/24/2020   [facility] 01/24/2020, 4:30 PM

## 2020-01-24 NOTE — Consult Note (Signed)
WOC Nurse Consult Note: Reason for Consult: several areas of raised, erythematous lesions on trunk, largest of which is in the LLQ.  I&D performed in ED with purulence expressed. No fluctuance. Most consistent with bites, possibly chiggers vs spider vs ticks. Wound type: Infectious vs inflammatory Pressure Injury POA: N/A Measurement: See photos provided to EMR. Largest lesion measures 0.6 x 1.8cm with depth undetermined, but superficial. Wound YKD:XIPJA serum (scabbing) Drainage (amount, consistency, odor) Small serous Periwound:erythematous, indurated Dressing procedure/placement/frequency: While slightly outside the scope of [place] nursing, conservative topical care guidance is provided for Nursing via the Orders using soap and water to cleanse, pat dry and followed by a thin application of 1% hydrocortisone cream.  Ideally, Dermatology would weigh in and provide topical care guidance, but in this setting and for a nonsystemic presentation such as this, those providers typically consult post discharge. Alternatively, in house providers may wish to consult with ID if the conservative guidance is ineffective or if a more definitive diagnosis is desired.  [facility] nursing team will not follow, but will remain available to this patient, the nursing and medical teams.  Please re-consult if needed. Thanks, Maudie Flakes, MSN, RN, [place], [facility]  Pager# [phone]

## 2020-01-24 NOTE — TOC Initial Note (Signed)
Transition of Care [facility]) - Initial/Assessment Note    Patient Details  Name: Kelli Jenkins MRN: [id number] Date of Birth: 1960/11/21  Transition of Care [facility]) CM/SW Contact:    Greg Cutter, LCSW Phone Number: 01/24/2020, 9:39 AM  Clinical Narrative:  LCSW hand delivered housing and substance abuse resources for [facility] to patient on 01/24/20. Patient was asleep and did not wish to wake up in order for social work to provide education and review of resources. Contact number provided on resources in case she had any questions.          Prior Living Arrangements/Services    Homeless   Activities of Daily Living Home Assistive Devices/Equipment: Eyeglasses ADL Screening (condition at time of admission) Patient's cognitive ability adequate to safely complete daily activities?: Yes Is the patient deaf or have difficulty hearing?: No Does the patient have difficulty seeing, even when wearing glasses/contacts?: No Does the patient have difficulty concentrating, remembering, or making decisions?: No Patient able to express need for assistance with ADLs?: Yes Does the patient have difficulty dressing or bathing?: No Independently performs ADLs?: Yes (appropriate for developmental age) Does the patient have difficulty walking or climbing stairs?: No Weakness of Legs: None Weakness of Arms/Hands: None   Admission diagnosis:  Cellulitis [L03.90] Patient Active Problem List   Diagnosis Date Noted  . Cellulitis 01/22/2020  . Severe sepsis ([place]) 01/22/2020  . Homelessness 01/22/2020  . Chest pain 06/08/2016  . Nausea & vomiting 06/08/2016  . Intractable vomiting with nausea   . CAP (community acquired pneumonia) 05/03/2016  . Normocytic anemia 05/03/2016  . Urinary tract infection without hematuria   . Opioid overdose ([place]) 01/21/2016  . Somnolence 01/21/2016  . Cocaine abuse ([place]) 01/21/2016  . Opioid abuse ([place]) 01/21/2016  . UTI (urinary tract infection) 07/13/2013   . HCAP (healthcare-associated pneumonia) 08/16/2011  . Migraine 08/15/2011  . SIRS (systemic inflammatory response syndrome) ([place]) 08/13/2011  . Pyelonephritis 08/12/2011  . Ureteral colic 25/00/3704  . Hypotension 08/12/2011  . Fever 08/12/2011  . Nephrolithiasis 08/12/2011  . Leukocytosis 08/12/2011  . Tobacco use disorder 08/12/2011   PCP:  Patient, No Pcp Per Pharmacy:   [place] [address] Phone: [phone] Fax: [place], [place] [address] Phone: [phone] Fax: [phone]   Readmission Risk Interventions No flowsheet data found.  Eula Fried, BSW, MSW, LCSW [email or web address] Phone: [phone]

## 2020-01-24 NOTE — Progress Notes (Signed)
PROGRESS NOTE  Kelli Jenkins ZOX:[id number] DOB: 29-Nov-1960   PCP: Patient, No Pcp Per  Patient is from: Homeless. Sleeps in her friends backyard  DOA: 01/22/2020 LOS: 2  Brief Narrative / Interim history: 59 year old homeless female with history of polysubstance use (cocaine, marijuana and tobacco) presenting with fever, chills, skin redness and pain over left lateral abdominal wall, and admitted for severe sepsis due to left lateral abdominal wall cellulitis.   In ED, she was febrile, tachycardic, tachypneic and encephalopathic.  WBC 20.7 with left shift.  Lactic acid normal.  UA without significant finding.  Influenza and COVID-19 PCR negative.  UDS positive for cocaine.  EDP trained bedside I&D.  Blood and tissue culture obtained.  She was a started on IV vancomycin.  Superficial wound culture with MRSA but sensitive to tetracycline.  She was transitioned to IV doxycycline on 01/24/2020.  Blood cultures NGTD.   Subjective: Seen and examined earlier this morning.  She reports feeling sick in her stomach.  She reports one episode of emesis with yellowish output.  She denies blood in emesis.  Denies other symptoms but says "7/10" when asked about pain.  Also reports feeling lightheaded when she tried to get up to go to the bathroom.  Objective: Vitals:   01/23/20 1934 01/23/20 2220 01/24/20 0609 01/24/20 1311  BP: 126/72 (!) 116/58 126/61 135/74  Pulse: 99 91 74 70  Resp: _0 Temp: 98.8 F (37.1 C) 100 F (37.8 C) 99.6 F (37.6 C) 97.8 F (36.6 C)  TempSrc: Oral   Oral  SpO2: 97% 95% 97% 95%  Weight:      Height:        Intake/Output Summary (Last 24 hours) at 01/24/2020 1336 Last data filed at 01/24/2020 0930 Gross per 24 hour  Intake 2544.95 ml  Output 0 ml  Net 2544.95 ml   Filed Weights   01/23/20 1000  Weight: 99.8 kg    Examination:  GENERAL: No apparent distress.  Nontoxic. HEENT: MMM.  Vision and hearing grossly intact.  NECK: Supple.  No  apparent JVD.  RESP:  No IWOB.  Fair aeration bilaterally. CVS:  RRR. Heart sounds normal.  No murmurs. ABD/GI/GU: BS+. Abd soft, NTND.  MSK/EXT:  Moves extremities. No apparent deformity. No edema.  SKIN: Erythematous skin lesion with central ulceration over LLQ (improved)..  Smaller spots of erythematous lesion elsewhere on abdomen.  NEURO: Awake, alert and oriented appropriately.  No apparent focal neuro deficit. PSYCH: Calm. Normal affect.   Procedures:  01/22/2020-bedside I&D by EDP  Microbiology summarized: COVID-19 PCR negative. Influenza PCR negative. Superficial wound culture with MRSA sensitive to tetracycline Blood culture NGTD. Urine culture with insignificant growth.  Assessment & Plan: Severe sepsis due to abdominal wall cellulitis-POA.  Patient met criteria with fever, tachycardia, tachypnea, leukocytosis, hypotension, encephalopathy and known source of infection as above.  Sepsis physiology and cellulitis improving.  Superficial wound culture with MRSA but sensitive to tetracycline.  Blood culture NGTD. -IV vancomycin 10/1-10/3.  IV doxycycline 10/3>> will change to p.o. once nausea and vomiting improves. -Scheduled Tylenol for pain control -Appreciate WOCN input  Nausea/emesis-unclear cause of this. -Continue IV fluid -IV Zofran  Hypotension/lightheadedness: Likely due to the above.  Hypotension resolved but continues to endorse lightheadedness when she gets up. -Continue IV fluid -Orthostatic vitals  -OOB/PT/OT eval  Leukocytosis/bandemia: Likely due to the above.  Improved. -Continue monitoring  Polysubstance use/homelessness/no PCP: Smokes about a pack a day. Reluctantly admits to cocaine and marijuana  UDS positive for cocaine. °-Counseled on smoking and cocaine cessation °-Nicotine patch °-TOC consulted for resources. ° ° ° °Body mass index is 33.45 kg/m². °  °  °  °  °DVT prophylaxis:  ° ° °Code Status: Full code °Family Communication: Patient and/or  RN. Available if any question.  °Status is: Inpatient ° °Remains inpatient appropriate because:Hemodynamically unstable, IV treatments appropriate due to intensity of illness or inability to take PO and Inpatient level of care appropriate due to severity of illness ° ° °Dispo: The patient is from: Homeless °             Anticipated d/c is to: To be determined °             Anticipated d/c date is: 1 day °             Patient currently is not medically stable to d/c. ° ° ° ° ° ° °Consultants:  °None ° ° °Sch Meds:  °Scheduled Meds: °• acetaminophen  1,000 mg Oral Q8H  °• enoxaparin (LOVENOX) injection  0.5 mg/kg Subcutaneous Q24H  °• hydrocortisone cream   Topical TID  °• nicotine  21 mg Transdermal Daily  ° °Continuous Infusions: °• doxycycline (VIBRAMYCIN) IV 100 mg (01/24/20 1327)  °• lactated ringers 100 mL/hr at 01/24/20 1200  ° °PRN Meds:. ° °Antimicrobials: °Anti-infectives (From admission, onward)  ° Start     Dose/Rate Route Frequency Ordered Stop  ° 01/24/20 1200  doxycycline (VIBRAMYCIN) 100 mg in sodium chloride 0.9 % 250 mL IVPB       ° 100 mg °125 mL/hr over 120 Minutes Intravenous Every 12 hours 01/24/20 1058 01/31/20 1159  ° 01/22/20 2200  vancomycin (VANCOREADY) IVPB 1500 mg/300 mL  Status:  Discontinued       ° 1,500 mg °150 mL/hr over 120 Minutes Intravenous Every 24 hours 01/22/20 1934 01/24/20 1057  ° 01/22/20 1730  vancomycin (VANCOCIN) IVPB 1000 mg/200 mL premix  Status:  Discontinued       ° 1,000 mg °200 mL/hr over 60 Minutes Intravenous  Once 01/22/20 1716 01/22/20 1934  ° 01/22/20 1215  vancomycin (VANCOCIN) IVPB 1000 mg/200 mL premix       ° 1,000 mg °200 mL/hr over 60 Minutes Intravenous  Once 01/22/20 1212 01/22/20 1340  ° 01/22/20 0845  clindamycin (CLEOCIN) capsule 450 mg       ° 450 mg Oral  Once 01/22/20 0840 01/22/20 0900  °  ° ° ° °I have personally reviewed the following labs and images: °CBC: °Recent Labs  °Lab 01/22/20 °0850 01/23/20 °0502 01/24/20 °0715  °WBC 20.7* 18.2* 10.9*    °NEUTROABS 18.4*  --   --   °HGB 13.9 12.6 12.3  °HCT 41.5 39.4 37.3  °MCV 95.4 99.7 95.6  °PLT 254 254 242  ° °BMP &GFR °Recent Labs  °Lab 01/22/20 °1019 01/23/20 °0502 01/24/20 °0715  °NA 135 139 137  °K 4.0 4.4 3.9  °CL 102 105 106  °CO2 23 25 23  °GLUCOSE 118* 107* 119*  °BUN 11 11 10  °CREATININE 0.99 1.03* 0.80  °CALCIUM 8.1* 8.5* 8.4*  °MG  --   --  2.1  °PHOS  --   --  3.9  ° °Estimated Creatinine Clearance: 92.4 mL/min (by C-G formula based on SCr of 0.8 mg/dL). °Liver & Pancreas: °Recent Labs  °Lab 01/22/20 °1049 01/23/20 °0502 01/24/20 °0715  °AST 15 12*  --   °ALT 13 11  --   °ALKPHOS 59 57  --   °  BILITOT 0.7 0.8  --   °PROT 6.0* 6.1*  --   °ALBUMIN 3.0* 3.0* 2.9*  ° °No results for input(s): LIPASE, AMYLASE in the last 168 hours. °No results for input(s): AMMONIA in the last 168 hours. °Diabetic: °No results for input(s): HGBA1C in the last 72 hours. °No results for input(s): GLUCAP in the last 168 hours. °Cardiac Enzymes: °No results for input(s): CKTOTAL, CKMB, CKMBINDEX, TROPONINI in the last 168 hours. °No results for input(s): PROBNP in the last 8760 hours. °Coagulation Profile: °Recent Labs  °Lab 01/22/20 °1049  °INR 1.0  ° °Thyroid Function Tests: °No results for input(s): TSH, T4TOTAL, FREET4, T3FREE, THYROIDAB in the last 72 hours. °Lipid Profile: °No results for input(s): CHOL, HDL, LDLCALC, TRIG, CHOLHDL, LDLDIRECT in the last 72 hours. °Anemia Panel: °No results for input(s): VITAMINB12, FOLATE, FERRITIN, TIBC, IRON, RETICCTPCT in the last 72 hours. °Urine analysis: °   °Component Value Date/Time  ° COLORURINE YELLOW 01/22/2020 1029  ° APPEARANCEUR CLEAR 01/22/2020 1029  ° LABSPEC 1.015 01/22/2020 1029  ° PHURINE 8.0 01/22/2020 1029  ° GLUCOSEU NEGATIVE 01/22/2020 1029  ° HGBUR NEGATIVE 01/22/2020 1029  ° BILIRUBINUR NEGATIVE 01/22/2020 1029  ° KETONESUR NEGATIVE 01/22/2020 1029  ° PROTEINUR NEGATIVE 01/22/2020 1029  ° UROBILINOGEN 1.0 03/04/2015 1904  ° NITRITE NEGATIVE 01/22/2020 1029  °  LEUKOCYTESUR NEGATIVE 01/22/2020 1029  ° °Sepsis Labs: °Invalid input(s): PROCALCITONIN, LACTICIDVEN ° °Microbiology: °Recent Results (from the past 240 hour(s))  °Respiratory Panel by RT PCR (Flu A&B, Covid) - Nasopharyngeal Swab     Status: None  ° Collection Time: 01/22/20 10:19 AM  ° Specimen: Nasopharyngeal Swab  °Result Value Ref Range Status  ° SARS Coronavirus 2 by RT PCR NEGATIVE NEGATIVE Final  °  Comment: (NOTE) °SARS-CoV-2 target nucleic acids are NOT DETECTED. ° °The SARS-CoV-2 RNA is generally detectable in upper respiratoy °specimens during the acute phase of infection. The lowest °concentration of SARS-CoV-2 viral copies this assay can detect is °131 copies/mL. A negative result does not preclude SARS-Cov-2 °infection and should not be used as the sole basis for treatment or °other patient management decisions. A negative result may occur with  °improper specimen collection/handling, submission of specimen other °than nasopharyngeal swab, presence of viral mutation(s) within the °areas targeted by this assay, and inadequate number of viral copies °(<131 copies/mL). A negative result must be combined with clinical °observations, patient history, and epidemiological information. The °expected result is Negative. ° °Fact Sheet for Patients:  °https://www.fda.gov/media/142436/download ° °Fact Sheet for Healthcare Providers:  °https://www.fda.gov/media/142435/download ° °This test is no t yet approved or cleared by the [place] FDA and  °has been authorized for detection and/or diagnosis of SARS-CoV-2 by °FDA under an Emergency Use Authorization (EUA). This EUA will remain  °in effect (meaning this test can be used) for the duration of the °COVID-19 declaration under Section 564(b)(1) of the Act, 21 U.S.C. °section 360bbb-3(b)(1), unless the authorization is terminated or °revoked sooner. ° °  ° Influenza A by PCR NEGATIVE NEGATIVE Final  ° Influenza B by PCR NEGATIVE NEGATIVE Final  °  Comment:  (NOTE) °The Xpert Xpress SARS-CoV-2/FLU/RSV assay is intended as an aid in  °the diagnosis of influenza from Nasopharyngeal swab specimens and  °should not be used as a sole basis for treatment. Nasal washings and  °aspirates are unacceptable for Xpert Xpress SARS-CoV-2/FLU/RSV  °testing. ° °Fact Sheet for Patients: °https://www.fda.gov/media/142436/download ° °Fact Sheet for Healthcare Providers: °https://www.fda.gov/media/142435/download ° °This test is not yet approved or cleared by the   [place] FDA and  °has been authorized for detection and/or diagnosis of SARS-CoV-2 by  °FDA under an Emergency Use Authorization (EUA). This EUA will remain  °in effect (meaning this test can be used) for the duration of the  °Covid-19 declaration under Section 564(b)(1) of the Act, 21  °U.S.C. section 360bbb-3(b)(1), unless the authorization is  °terminated or revoked. °Performed at [facility], [address] °[place], [place] [address] °  °Urine culture     Status: Abnormal  ° Collection Time: 01/22/20 10:29 AM  ° Specimen: Urine, Clean Catch  °Result Value Ref Range Status  ° Specimen Description   Final  °  URINE, CLEAN CATCH °Performed at [facility], [address] [place], [place] [address] °  ° Special Requests   Final  °  NONE °Performed at [facility], [address] [place], [place] [address] °  ° Culture (A)  Final  °  <10,000 COLONIES/mL INSIGNIFICANT GROWTH °Performed at [facility], [address] [place], [place] [address] °  ° Report Status 01/23/2020 FINAL  Final  °Blood culture (routine single)     Status: None (Preliminary result)  ° Collection Time: 01/22/20 10:49 AM  ° Specimen: BLOOD  °Result Value Ref Range Status  ° Specimen Description   Final  °  BLOOD BLOOD LEFT FOREARM °Performed at [facility], [address] [place], [place] [address] °  ° Special Requests   Final  °  BOTTLES DRAWN AEROBIC AND ANAEROBIC  Blood Culture adequate volume °Performed at [facility], [address] [place], [place] [address] °  ° Culture   Final  °  NO GROWTH 2 DAYS °Performed at [facility], [address] [place], [place] [address] °  ° Report Status PENDING  Incomplete  °Wound or Superficial Culture     Status: None  ° Collection Time: 01/22/20  3:22 PM  ° Specimen: Abscess; Wound  °Result Value Ref Range Status  ° Specimen Description   Final  °  ABSCESS °Performed at  [facility], [address] [place], [place] [address] °  ° Special Requests   Final  °  NONE °Performed at  [facility], [address] [place], [place] [address] °  ° Gram Stain   Final  °  MODERATE WBC PRESENT, PREDOMINANTLY PMN °FEW GRAM POSITIVE COCCI °Performed at [facility], [address] [place],  [address] °  ° Culture RARE METHICILLIN RESISTANT STAPHYLOCOCCUS AUREUS  Final  ° Report Status 01/24/2020 FINAL  Final  ° Organism ID, Bacteria METHICILLIN RESISTANT STAPHYLOCOCCUS AUREUS  Final  °    Susceptibility  ° Methicillin resistant staphylococcus aureus - MIC*  °  CIPROFLOXACIN >=8 RESISTANT Resistant   °  ERYTHROMYCIN >=8 RESISTANT Resistant   °  GENTAMICIN <=0.5 SENSITIVE Sensitive   °  OXACILLIN >=4 RESISTANT Resistant   °  TETRACYCLINE <=1 SENSITIVE Sensitive   °  VANCOMYCIN 1 SENSITIVE Sensitive   °  TRIMETH/SULFA <=10 SENSITIVE Sensitive   °  CLINDAMYCIN <=0.25 SENSITIVE Sensitive   °  RIFAMPIN <=0.5 SENSITIVE Sensitive   °  Inducible Clindamycin NEGATIVE Sensitive   °  * RARE METHICILLIN RESISTANT STAPHYLOCOCCUS AUREUS  ° ° °Radiology Studies: °No results found. ° ° ° T.  °[facility] ° °If 7PM-7AM, please contact night-coverage [email or web address] °01/24/2020, 1:36 PM  °

## 2020-01-25 ENCOUNTER — Inpatient Hospital Stay (HOSPITAL_COMMUNITY): Payer: Self-pay

## 2020-01-25 DIAGNOSIS — K0530 Chronic periodontitis, unspecified: Secondary | ICD-10-CM

## 2020-01-25 DIAGNOSIS — I951 Orthostatic hypotension: Secondary | ICD-10-CM

## 2020-01-25 LAB — RENAL FUNCTION PANEL
Albumin: 2.8 g/dL — ABNORMAL LOW (ref 3.5–5.0)
Anion gap: 10 (ref 5–15)
BUN: 10 mg/dL (ref 6–20)
CO2: 27 mmol/L (ref 22–32)
Calcium: 9.3 mg/dL (ref 8.9–10.3)
Chloride: 104 mmol/L (ref 98–111)
Creatinine, Ser: 0.82 mg/dL (ref 0.44–1.00)
GFR calc Af Amer: >60 mL/min (ref >60)
GFR calc non Af Amer: >60 mL/min (ref >60)
Glucose, Bld: 164 mg/dL — ABNORMAL HIGH (ref 70–99)
Phosphorus: 4.7 mg/dL — ABNORMAL HIGH (ref 2.5–4.6)
Potassium: 3.8 mmol/L (ref 3.5–5.1)
Sodium: 141 mmol/L (ref 135–145)

## 2020-01-25 LAB — ECHOCARDIOGRAM COMPLETE
Area-P 1/2: 2.6 cm2
Calc EF: 67.7 %
Height: 68 in
S' Lateral: 2.3 cm
Single Plane A2C EF: 67.2 %
Single Plane A4C EF: 68.2 %
Weight: 3520 oz

## 2020-01-25 LAB — CBC
HCT: 37.4 % (ref 36.0–46.0)
Hemoglobin: 12.1 g/dL (ref 12.0–15.0)
MCH: 31.2 pg (ref 26.0–34.0)
MCHC: 32.4 g/dL (ref 30.0–36.0)
MCV: 96.4 fL (ref 80.0–100.0)
Platelets: 256 10*3/uL (ref 150–400)
RBC: 3.88 MIL/uL (ref 3.87–5.11)
RDW: 12.8 % (ref 11.5–15.5)
WBC: 9.5 10*3/uL (ref 4.0–10.5)
nRBC: 0 % (ref 0.0–0.2)

## 2020-01-25 LAB — MAGNESIUM: Magnesium: 2 mg/dL (ref 1.7–2.4)

## 2020-01-25 MED ORDER — MAGIC MOUTHWASH W/LIDOCAINE
5.0000 mL | Freq: Three times a day (TID) | ORAL | Status: DC
  Administered 2020-01-25 – 2020-01-27 (×7): 5 mL via ORAL
  Filled 2020-01-25 (×9): qty 5

## 2020-01-25 MED ORDER — LACTATED RINGERS IV SOLN: INTRAVENOUS | Status: AC

## 2020-01-25 NOTE — Progress Notes (Signed)
PROGRESS NOTE  Breindy Meadow CXK:[id number] DOB: 02-04-1961   PCP: Patient, No Pcp Per  Patient is from: Homeless. Sleeps in her friends backyard  DOA: 01/22/2020 LOS: 3  Brief Narrative / Interim history: 59 year old homeless female with history of polysubstance use (cocaine, marijuana and tobacco) presenting with fever, chills, skin redness and pain over left lateral abdominal wall, and admitted for severe sepsis due to left lateral abdominal wall cellulitis.   In ED, she was febrile, tachycardic, tachypneic and encephalopathic.  WBC 20.7 with left shift.  Lactic acid normal.  UA without significant finding.  Influenza and COVID-19 PCR negative.  UDS positive for cocaine.  EDP trained bedside I&D.  Blood and tissue culture obtained.  She was a started on IV vancomycin.  Superficial wound culture with MRSA but sensitive to tetracycline.  She was transitioned to IV doxycycline on 01/24/2020.  Blood cultures NGTD.  Echocardiogram a repeat blood culture pending.   Subjective: Seen and examined earlier this morning.  No major events overnight of this morning.  Reports significant left lower abdominal wall pain, nausea, dizziness and painful oral lesion.  Feels lightheaded when she tries to get up.  No focal neuro deficit.  Objective: Vitals:   01/25/20 0514 01/25/20 0602 01/25/20 0855 01/25/20 1337  BP:  (!) 99/55 (!) 109/59 (!) 99/47  Pulse: 76  79 76  Resp: 16  14 16   Temp: 98.4 F (36.9 C)  97.8 F (36.6 C) 97.6 F (36.4 C)  TempSrc:   Oral Oral  SpO2: 96%  93% 96%  Weight:      Height:        Intake/Output Summary (Last 24 hours) at 01/25/2020 1555 Last data filed at 01/25/2020 1532 Gross per 24 hour  Intake 4137.24 ml  Output 250 ml  Net 3887.24 ml   Filed Weights   01/23/20 1000  Weight: 99.8 kg    Examination:  GENERAL: No apparent distress.  Nontoxic. HEENT: MMM.  Vision and hearing grossly intact.  NECK: Supple.  No apparent JVD.  RESP: On room air.  No  IWOB.  Fair aeration bilaterally. CVS:  RRR. Heart sounds normal.  No murmurs. ABD/GI/GU: BS+. Abd soft, NTND.  MSK/EXT:  Moves extremities. No apparent deformity. No edema.  No stigmata of endocarditis. SKIN: Erythematous skin lesion with central ulceration over LLQ (improved).  Smaller spots on the right side. NEURO: Awake, alert and oriented appropriately.  No apparent focal neuro deficit. PSYCH: Calm. Normal affect.  Procedures:  01/22/2020-bedside I&D by EDP  Microbiology summarized: COVID-19 PCR negative. Influenza PCR negative. Superficial wound culture with MRSA sensitive to tetracycline Blood culture NGTD. Urine culture with insignificant growth. Repeat blood culture pending.  Assessment & Plan: Severe sepsis due to abdominal wall cellulitis-POA.  Patient met criteria with fever, tachycardia, tachypnea, leukocytosis, hypotension, encephalopathy and known source of infection as above.  Sepsis physiology and cellulitis improving.  Superficial wound culture with MRSA but sensitive to tetracycline.  Blood culture NGTD.  Soft blood pressures.  No stigmata of endocarditis.  Denies IVDU.  -IV vancomycin 10/1-10/3.  IV doxycycline 10/3>> will change to p.o. once nausea and vomiting improves. -Scheduled Tylenol for pain control -Continue IV fluid -TTE, orthostatic vitals and repeat blood culture -Appreciate WOCN input  Nausea/emesis-unclear cause of this. -Continue IV fluid and IV Zofran  Hypotension/lightheadedness: Likely due to the above.  Slightly hypotensive again. -Continue IV fluid -TTE, orthostatic vitals and blood cultures as above -OOB/PT/OT eval  Leukocytosis/bandemia: Likely due to the above.  Resolved.  Periodontitis/dental caries:  -Check HSV 1/2 IgM -Magic mouthwash with lidocaine -May add Augmentin if no improvement. -Need to see dental surgery outpatient.  Polysubstance use/homelessness/no PCP: Smokes about a pack a day. Reluctantly admits to cocaine and  marijuana use.  UDS positive for cocaine. -Counseled on smoking and cocaine cessation -Nicotine patch -TOC consulted for resources.    Body mass index is 33.45 kg/m.         DVT prophylaxis:    Code Status: Full code Family Communication: Patient and/or RN. Available if any question.  Status is: Inpatient  Remains inpatient appropriate because:Hemodynamically unstable, IV treatments appropriate due to intensity of illness or inability to take PO and Inpatient level of care appropriate due to severity of illness   Dispo: The patient is from: Homeless              Anticipated d/c is to: To be determined              Anticipated d/c date is: 1 day              Patient currently is not medically stable to d/c.       Consultants:  None   Sch Meds:  Scheduled Meds: . acetaminophen  1,000 mg Oral Q8H  . enoxaparin (LOVENOX) injection  0.5 mg/kg Subcutaneous Q24H  . hydrocortisone cream   Topical TID  . magic mouthwash w/lidocaine  5 mL Oral TID  . nicotine  21 mg Transdermal Daily   Continuous Infusions: . doxycycline (VIBRAMYCIN) IV Stopped (01/25/20 1420)   PRN Meds:.  Antimicrobials: Anti-infectives (From admission, onward)   Start     Dose/Rate Route Frequency Ordered Stop   01/24/20 1200  doxycycline (VIBRAMYCIN) 100 mg in sodium chloride 0.9 % 250 mL IVPB        100 mg 125 mL/hr over 120 Minutes Intravenous Every 12 hours 01/24/20 1058 01/31/20 1159   01/22/20 2200  vancomycin (VANCOREADY) IVPB 1500 mg/300 mL  Status:  Discontinued        1,500 mg 150 mL/hr over 120 Minutes Intravenous Every 24 hours 01/22/20 1934 01/24/20 1057   01/22/20 1730  vancomycin (VANCOCIN) IVPB 1000 mg/200 mL premix  Status:  Discontinued        1,000 mg 200 mL/hr over 60 Minutes Intravenous  Once 01/22/20 1716 01/22/20 1934   01/22/20 1215  vancomycin (VANCOCIN) IVPB 1000 mg/200 mL premix        1,000 mg 200 mL/hr over 60 Minutes Intravenous  Once 01/22/20 1212 01/22/20 1340     01/22/20 0845  clindamycin (CLEOCIN) capsule 450 mg        450 mg Oral  Once 01/22/20 0840 01/22/20 0900       I have personally reviewed the following labs and images: CBC: Recent Labs  Lab 01/22/20 0850 01/23/20 0502 01/24/20 0715 01/25/20 0349  WBC 20.7* 18.2* 10.9* 9.5  NEUTROABS 18.4*  --   --   --   HGB 13.9 12.6 12.3 12.1  HCT 41.5 39.4 37.3 37.4  MCV 95.4 99.7 95.6 96.4  PLT 254 254 242 256   BMP &GFR Recent Labs  Lab 01/22/20 1019 01/23/20 0502 01/24/20 0715 01/25/20 0349  NA 135 139 137 141  K 4.0 4.4 3.9 3.8  CL 102 105 106 104  CO2 23 25 23 27   GLUCOSE 118* 107* 119* 164*  BUN 11 11 10 10   CREATININE 0.99 1.03* 0.80 0.82  CALCIUM 8.1* 8.5* 8.4* 9.3  MG  --   --  2.1 2.0  PHOS  --   --  3.9 4.7*   Estimated Creatinine Clearance: 90.2 mL/min (by C-G formula based on SCr of 0.82 mg/dL). Liver & Pancreas: Recent Labs  Lab 01/22/20 1049 01/23/20 0502 01/24/20 0715 01/25/20 0349  AST 15 12*  --   --   ALT 13 11  --   --   ALKPHOS 59 57  --   --   BILITOT 0.7 0.8  --   --   PROT 6.0* 6.1*  --   --   ALBUMIN 3.0* 3.0* 2.9* 2.8*   No results for input(s): LIPASE, AMYLASE in the last 168 hours. No results for input(s): AMMONIA in the last 168 hours. Diabetic: No results for input(s): HGBA1C in the last 72 hours. No results for input(s): GLUCAP in the last 168 hours. Cardiac Enzymes: No results for input(s): CKTOTAL, CKMB, CKMBINDEX, TROPONINI in the last 168 hours. No results for input(s): PROBNP in the last 8760 hours. Coagulation Profile: Recent Labs  Lab 01/22/20 1049  INR 1.0   Thyroid Function Tests: No results for input(s): TSH, T4TOTAL, FREET4, T3FREE, THYROIDAB in the last 72 hours. Lipid Profile: No results for input(s): CHOL, HDL, LDLCALC, TRIG, CHOLHDL, LDLDIRECT in the last 72 hours. Anemia Panel: No results for input(s): VITAMINB12, FOLATE, FERRITIN, TIBC, IRON, RETICCTPCT in the last 72 hours. Urine analysis:    Component  Value Date/Time   COLORURINE YELLOW 01/22/2020 1029   APPEARANCEUR CLEAR 01/22/2020 1029   LABSPEC 1.015 01/22/2020 1029   PHURINE 8.0 01/22/2020 1029   GLUCOSEU NEGATIVE 01/22/2020 1029   HGBUR NEGATIVE 01/22/2020 [place] 01/22/2020 1029   KETONESUR NEGATIVE 01/22/2020 1029   PROTEINUR NEGATIVE 01/22/2020 1029   UROBILINOGEN 1.0 03/04/2015 1904   NITRITE NEGATIVE 01/22/2020 [place] 01/22/2020 1029   Sepsis Labs: Invalid input(s): PROCALCITONIN, [place]  Microbiology: Recent Results (from the past 240 hour(s))  Respiratory Panel by RT PCR (Flu A&B, Covid) - Nasopharyngeal Swab     Status: None   Collection Time: 01/22/20 10:19 AM   Specimen: Nasopharyngeal Swab  Result Value Ref Range Status   SARS Coronavirus 2 by RT PCR NEGATIVE NEGATIVE Final    Comment: (NOTE) SARS-CoV-2 target nucleic acids are NOT DETECTED.  The SARS-CoV-2 RNA is generally detectable in upper respiratoy specimens during the acute phase of infection. The lowest concentration of SARS-CoV-2 viral copies this assay can detect is 131 copies/mL. A negative result does not preclude SARS-Cov-2 infection and should not be used as the sole basis for treatment or other patient management decisions. A negative result may occur with  improper specimen collection/handling, submission of specimen other than nasopharyngeal swab, presence of viral mutation(s) within the areas targeted by this assay, and inadequate number of viral copies (<131 copies/mL). A negative result must be combined with clinical observations, patient history, and epidemiological information. The expected result is Negative.  Fact Sheet for Patients:  PinkCheek.be  Fact Sheet for Healthcare Providers:  GravelBags.it  This test is no t yet approved or cleared by the [place] FDA and  has been authorized for detection and/or diagnosis of  SARS-CoV-2 by FDA under an Emergency Use Authorization (EUA). This EUA will remain  in effect (meaning this test can be used) for the duration of the COVID-19 declaration under Section 564(b)(1) of the Act, 21 U.S.C. section 360bbb-3(b)(1), unless the authorization is terminated or revoked sooner.     Influenza A by PCR NEGATIVE NEGATIVE Final  Influenza B by PCR NEGATIVE NEGATIVE Final    Comment: (NOTE) The Xpert Xpress SARS-CoV-2/FLU/RSV assay is intended as an aid in  the diagnosis of influenza from Nasopharyngeal swab specimens and  should not be used as a sole basis for treatment. Nasal washings and  aspirates are unacceptable for Xpert Xpress SARS-CoV-2/FLU/RSV  testing.  Fact Sheet for Patients: PinkCheek.be  Fact Sheet for Healthcare Providers: GravelBags.it  This test is not yet approved or cleared by the [place] FDA and  has been authorized for detection and/or diagnosis of SARS-CoV-2 by  FDA under an Emergency Use Authorization (EUA). This EUA will remain  in effect (meaning this test can be used) for the duration of the  Covid-19 declaration under Section 564(b)(1) of the Act, 21  U.S.C. section 360bbb-3(b)(1), unless the authorization is  terminated or revoked. Performed at [facility], [place] [address] [place], [place] [address]   Urine culture     Status: Abnormal   Collection Time: 01/22/20 10:29 AM   Specimen: Urine, Clean Catch  Result Value Ref Range Status   Specimen Description   Final    URINE, CLEAN CATCH Performed at [facility], [place] [address], [place], [place] [address]    Special Requests   Final    NONE Performed at [facility], [place] [address], [place], [place] [address]    Culture (A)  Final    <10,000 COLONIES/mL INSIGNIFICANT GROWTH Performed at [place] [address], [place], [place] [address]     Report Status 01/23/2020 FINAL  Final  Blood culture (routine single)     Status: None (Preliminary result)   Collection Time: 01/22/20 10:49 AM   Specimen: BLOOD  Result Value Ref Range Status   Specimen Description   Final    BLOOD BLOOD LEFT FOREARM Performed at [place] [address], [place], [place] [address]    Special Requests   Final    BOTTLES DRAWN AEROBIC AND ANAEROBIC Blood Culture adequate volume Performed at [place] [address], [place], [place] [address]    Culture   Final    NO GROWTH 3 DAYS Performed at [facility], [place] [address] [place], [place] [address]    Report Status PENDING  Incomplete  Wound or Superficial Culture     Status: None   Collection Time: 01/22/20  3:22 PM   Specimen: Abscess; Wound  Result Value Ref Range Status   Specimen Description   Final    ABSCESS Performed at [place] [address] [place], [place] [address]    Special Requests   Final    NONE Performed at [facility], [place] [address], [place], [place] [address]    Gram Stain   Final    MODERATE WBC PRESENT, PREDOMINANTLY PMN FEW GRAM POSITIVE COCCI Performed at [facility], [place] [address] [place], [place] [address]    Culture RARE METHICILLIN RESISTANT STAPHYLOCOCCUS AUREUS  Final   Report Status 01/24/2020 FINAL  Final   Organism ID, Bacteria METHICILLIN RESISTANT STAPHYLOCOCCUS AUREUS  Final      Susceptibility   Methicillin resistant staphylococcus aureus - MIC*    CIPROFLOXACIN >=8 RESISTANT Resistant     ERYTHROMYCIN >=8 RESISTANT Resistant     GENTAMICIN <=0.5 SENSITIVE Sensitive     OXACILLIN >=4 RESISTANT Resistant     TETRACYCLINE <=1 SENSITIVE Sensitive     VANCOMYCIN 1  SENSITIVE Sensitive     TRIMETH/SULFA <=10 SENSITIVE Sensitive     CLINDAMYCIN <=0.25 SENSITIVE Sensitive     RIFAMPIN <=0.5 SENSITIVE Sensitive     Inducible Clindamycin NEGATIVE  Sensitive     * RARE METHICILLIN RESISTANT STAPHYLOCOCCUS AUREUS  Culture, blood (routine x 2)     Status: None (Preliminary result)   Collection Time: 01/25/20 11:33 AM   Specimen: BLOOD  Result Value Ref Range Status   Specimen Description   Final    BLOOD LEFT ANTECUBITAL Performed at [facility], [place] [address], [place], [place] [address]    Special Requests   Final    BOTTLES DRAWN AEROBIC AND ANAEROBIC Blood Culture adequate volume Performed at [place] [address], [place], [place] [address]    Culture PENDING  Incomplete   Report Status PENDING  Incomplete    Radiology Studies: No results found.   Rice Walsh T. [place]  If 7PM-7AM, please contact night-coverage [email or web address] 01/25/2020, 3:55 PM

## 2020-01-25 NOTE — Evaluation (Signed)
Occupational Therapy Evaluation Patient Details Name: Kelli Jenkins MRN: [id number] DOB: 08/01/1960 Today's Date: 01/25/2020    History of Present Illness 59 yo homeless female with history of polysubstance use (cocaine, marijuana and tobacco) presenting with fever, chills, skin redness and pain over left lateral abdominal wall, and admitted for severe sepsis due to left lateral abdominal wall cellulitis.   Clinical Impression   Pt is overall S-I with ADL activity.   No further OT needed at this time.    Follow Up Recommendations  No OT follow up    Equipment Recommendations  None recommended by OT    Recommendations for Other Services       Precautions / Restrictions Precautions Precautions: Fall      Mobility Bed Mobility Overal bed mobility: Independent                Transfers Overall transfer level: Independent                    Balance Overall balance assessment: No apparent balance deficits (not formally assessed)                                         ADL either performed or assessed with clinical judgement   ADL Overall ADL's : At baseline                                       General ADL Comments: pt overall S-  I with ADL activity .  Pt feels she is at baseline just a little tired.  SHe also is nauseous - Rn notfied     Vision Patient Visual Report: No change from baseline       Perception     Praxis      Pertinent Vitals/Pain Pain Assessment: No/denies pain     Hand Dominance     Extremity/Trunk Assessment Upper Extremity Assessment Upper Extremity Assessment: Generalized weakness           Communication Communication Communication: No difficulties   Cognition Arousal/Alertness: Awake/alert Behavior During Therapy: WFL for tasks assessed/performed Overall Cognitive Status: Within Functional Limits for tasks assessed                                                 Home Living Family/patient expects to be discharged to:: Shelter/Homeless                                 Additional Comments: pt reports she does not stay in any certain area, homeless shelters are ususally full      Prior Functioning/Environment Level of Independence: Independent                          OT Goals(Current goals can be found in the care plan section) Acute Rehab OT Goals Patient Stated Goal: have less pain and get rid of infection  OT Frequency:      AM-PAC OT "6 Clicks" Daily Activity     Outcome Measure Help from another person eating meals?: None Help from another person  taking care of personal grooming?: None Help from another person toileting, which includes using toliet, bedpan, or urinal?: None Help from another person bathing (including washing, rinsing, drying)?: None Help from another person to put on and taking off regular upper body clothing?: None   6 Click Score: 20   End of Session Nurse Communication: Mobility status  Activity Tolerance: Patient tolerated treatment well Patient left: in chair                   Time: 9579-0092 OT Time Calculation (min): 15 min Charges:  OT General Charges $OT Visit: 1 Visit OT Evaluation $OT Eval Moderate Complexity: 1 Mod  Kelli Jenkins, OT Acute Rehabilitation Services [phone] Office- [phone]     Kelli Jenkins, Kelli Jenkins 01/25/2020, 1:26 PM

## 2020-01-25 NOTE — Progress Notes (Signed)
  Echocardiogram 2D Echocardiogram has been performed.  Kelli Jenkins 01/25/2020, 3:55 PM

## 2020-01-25 NOTE — TOC Progression Note (Signed)
Transition of Care [facility]) - Progression Note    Patient Details  Name: Kelli Jenkins MRN: [id number] Date of Birth: [birth year]  Transition of Care [facility]) CM/SW [place], [place] Phone Number: 01/25/2020, 10:56 AM  Clinical Narrative:    CSW met with the patient at bedside to discuss [employer]. Patient consented to enrolling. Patient express concerns with housing and assistance with disability. Patient has no phone however provided her daughter contact information.  Patient agreeable to [facility] clinic follow up appointment. Hospital Follow up scheduled for Thursday, February 04, 2020@ 2:30pm. Information written on AVS.   Expected Discharge Plan: Homeless Shelter Barriers to Discharge: Continued Medical Work up  Expected Discharge Plan and Services Expected Discharge Plan: Homeless Shelter In-house Referral: Clinical Social Work Discharge Planning Services: Follow-up appt scheduled Post Acute Care Choice: NA                   DME Arranged: N/A                     Social Determinants of Health (SDOH) Interventions    Readmission Risk Interventions No flowsheet data found.

## 2020-01-26 DIAGNOSIS — K0510 Chronic gingivitis, plaque induced: Secondary | ICD-10-CM

## 2020-01-26 DIAGNOSIS — K029 Dental caries, unspecified: Secondary | ICD-10-CM

## 2020-01-26 DIAGNOSIS — E271 Primary adrenocortical insufficiency: Secondary | ICD-10-CM

## 2020-01-26 LAB — HEMOGLOBIN A1C
Hgb A1c MFr Bld: 5.6 % (ref 4.8–5.6)
Mean Plasma Glucose: 114.02 mg/dL

## 2020-01-26 LAB — ACTH STIMULATION, 3 TIME POINTS
Cortisol, 30 Min: 21.9 ug/dL
Cortisol, 60 Min: 27.1 ug/dL
Cortisol, Base: 12.2 ug/dL

## 2020-01-26 MED ORDER — COSYNTROPIN 0.25 MG IJ SOLR
0.2500 mg | Freq: Once | INTRAMUSCULAR | Status: AC
  Administered 2020-01-26: 0.25 mg via INTRAVENOUS
  Filled 2020-01-26: qty 0.25

## 2020-01-26 MED ORDER — POLYETHYLENE GLYCOL 3350 17 G PO PACK: 17.0000 g | PACK | Freq: Two times a day (BID) | ORAL | Status: DC | PRN

## 2020-01-26 MED ORDER — HYDROCORTISONE 5 MG PO TABS
5.0000 mg | ORAL_TABLET | Freq: Every evening | ORAL | Status: DC
  Filled 2020-01-26: qty 1

## 2020-01-26 MED ORDER — SENNOSIDES-DOCUSATE SODIUM 8.6-50 MG PO TABS: 1.0000 | ORAL_TABLET | Freq: Two times a day (BID) | ORAL | Status: DC | PRN

## 2020-01-26 MED ORDER — HYDROCORTISONE 10 MG PO TABS: 10.0000 mg | ORAL_TABLET | Freq: Every morning | ORAL | Status: DC

## 2020-01-26 NOTE — Plan of Care (Signed)
  Problem: Clinical Measurements: Goal: Ability to maintain clinical measurements within normal limits will improve Outcome: Progressing   Problem: Clinical Measurements: Goal: Will remain free from infection Outcome: Progressing   Problem: Elimination: Goal: Will not experience complications related to bowel motility Outcome: Progressing   Problem: Pain Managment: Goal: General experience of comfort will improve Outcome: Progressing   Problem: Skin Integrity: Goal: Risk for impaired skin integrity will decrease Outcome: Progressing   Problem: Safety: Goal: Ability to remain free from injury will improve Outcome: Progressing

## 2020-01-26 NOTE — Progress Notes (Signed)
Ice pack applied to pt left lower abdomen due to increased c/o pain. Pt stated this ice pack helped the area feel better and area felt less firm as well.

## 2020-01-26 NOTE — Progress Notes (Addendum)
PROGRESS NOTE  Kelli Jenkins VEH:[id number] DOB: 1960/04/26   PCP: Patient, No Pcp Per  Patient is from: Homeless. Sleeps in her friends backyard  DOA: 01/22/2020 LOS: 4  Brief Narrative / Interim history: 59 year old homeless female with history of polysubstance use (cocaine, marijuana and tobacco) presenting with fever, chills, skin redness and pain over left lateral abdominal wall, and admitted for severe sepsis due to left lateral abdominal wall cellulitis.   In ED, she was febrile, tachycardic, tachypneic and encephalopathic.  WBC 20.7 with left shift.  Lactic acid normal.  UA without significant finding.  Influenza and COVID-19 PCR negative.  UDS positive for cocaine.  EDP trained bedside I&D.  Blood and tissue culture obtained.  She was a started on IV vancomycin.  Superficial wound culture with MRSA but sensitive to tetracycline.  She was transitioned to IV doxycycline on 01/24/2020.  Initial and repeat blood cultures NGTD.  Patient has orthostatic hypotension and continues to feel poorly with nausea and dizziness.  Echocardiogram without significant finding.  TSH normal.  Morning cortisol on low side of normal.  However, normal base cortisol level on ACTH stimulation test.  ACTH level pending.  Subjective: Seen and examined earlier this morning.  Reports feeling poorly.  Complains of burning pain over her left lower abdomen.  Also feels weak and nauseous.  Reports dizziness when she gets up.  Objective: Vitals:   01/25/20 0855 01/25/20 1337 01/25/20 2208 01/26/20 0535  BP: (!) 109/59 (!) 99/47 112/71 (!) 83/53  Pulse: 79 76 77 67  Resp: 14 16 16    Temp: 97.8 F (36.6 C) 97.6 F (36.4 C) 98.3 F (36.8 C) 98.1 F (36.7 C)  TempSrc: Oral Oral Oral Oral  SpO2: 93% 96% 96%   Weight:      Height:        Intake/Output Summary (Last 24 hours) at 01/26/2020 1314 Last data filed at 01/26/2020 8366 Gross per 24 hour  Intake 2624.21 ml  Output 300 ml  Net 2324.21 ml    Filed Weights   01/23/20 1000  Weight: 99.8 kg    Examination:  GENERAL: No apparent distress.  Nontoxic. HEENT: MMM.  Signs of gingivitis and dental caries NECK: Supple.  No apparent JVD.  RESP:  No IWOB.  Fair aeration bilaterally. CVS:  RRR. Heart sounds normal.  No heart murmurs. ABD/GI/GU: BS+. Abd soft, NTND.  MSK/EXT:  Moves extremities. No apparent deformity. No edema.  No stigmata of endocarditis. SKIN: Erythematous skin lesion with central ulceration over LLQ (improved).  Smaller spots on the right side NEURO: Awake, alert and oriented appropriately.  No apparent focal neuro deficit. PSYCH: Calm. Normal affect.   Procedures:  01/22/2020-bedside I&D by EDP  Microbiology summarized: COVID-19 PCR negative. Influenza PCR negative. Superficial wound culture with MRSA sensitive to tetracycline Initial and repeat blood cultures NGTD. Urine culture with insignificant growth.  Assessment & Plan: Severe sepsis due to abdominal wall cellulitis-POA.  Patient met criteria with fever, tachycardia, tachypnea, leukocytosis, hypotension, encephalopathy and known source of infection as above. Superficial wound culture with MRSA but sensitive to tetracycline.  Initial and repeat blood cultures NGTD.  No stigmata of endocarditis.  TTE without significant finding.  Denies IVDU.  Sepsis physiology and cellulitis improving but continues to feel poorly with soft blood pressures and orthostasis.  Some concern about adrenal sufficiency.  -IV vancomycin 10/1-10/3.  IV doxycycline 10/3>> will change to p.o. once nausea and vomiting improves. -Scheduled Tylenol for pain control -Continue IV fluid -Start stress dose  steroid -Appreciate WOCN input Addendum -BP improved.  Orthostatic vitals negative. Hold off stress dose steroid  Nausea/emesis-unclear cause of this. Constipation -Continue IV fluid and IV Zofran -Bowel regimen for constipation  Hypotension/orthostatic hypotension: Adrenal  insufficiency? A.m. cortisol low x2 although her base cortisol on ACTH stimulation test seems to be normal.  -Discontinue IV fluid -OOB/PT/OT eval -Orthostatic vitals daily Addendum -BP improved.  Orthostatic vitals negative. Hold off stress dose steroid  Gingivitis/dental caries- -Dental consult.  Left voicemail for our dental office at [facility] -Follow HSV 1/2 IgM-but unlikely -Magic mouthwash with lidocaine  Polysubstance use/homelessness/no PCP: Smokes about a pack a day. Reluctantly admits to cocaine and marijuana use.  UDS positive for cocaine. -Counseled on smoking and cocaine cessation -Nicotine patch -TOC consulted for resources.  Leukocytosis/bandemia: Likely due to the above.  Resolved.  Body mass index is 33.45 kg/m.         DVT prophylaxis:    Code Status: Full code Family Communication: Patient and/or RN. Available if any question.  Status is: Inpatient  Remains inpatient appropriate because:Hemodynamically unstable, IV treatments appropriate due to intensity of illness or inability to take PO and Inpatient level of care appropriate due to severity of illness   Dispo: The patient is from: Homeless              Anticipated d/c is to: Homeless shelter              Anticipated d/c date is: 1 day              Patient currently is not medically stable to d/c.       Consultants:  Dental-left VM   Sch Meds:  Scheduled Meds: . acetaminophen  1,000 mg Oral Q8H  . enoxaparin (LOVENOX) injection  0.5 mg/kg Subcutaneous Q24H  . magic mouthwash w/lidocaine  5 mL Oral TID  . nicotine  21 mg Transdermal Daily   Continuous Infusions: . doxycycline (VIBRAMYCIN) IV 100 mg (01/26/20 1237)  . lactated ringers 100 mL/hr at 01/26/20 0544   PRN Meds:.  Antimicrobials: Anti-infectives (From admission, onward)   Start     Dose/Rate Route Frequency Ordered Stop   01/24/20 1200  doxycycline (VIBRAMYCIN) 100 mg in sodium chloride 0.9 % 250 mL IVPB        100 mg 125  mL/hr over 120 Minutes Intravenous Every 12 hours 01/24/20 1058 01/31/20 1159   01/22/20 2200  vancomycin (VANCOREADY) IVPB 1500 mg/300 mL  Status:  Discontinued        1,500 mg 150 mL/hr over 120 Minutes Intravenous Every 24 hours 01/22/20 1934 01/24/20 1057   01/22/20 1730  vancomycin (VANCOCIN) IVPB 1000 mg/200 mL premix  Status:  Discontinued        1,000 mg 200 mL/hr over 60 Minutes Intravenous  Once 01/22/20 1716 01/22/20 1934   01/22/20 1215  vancomycin (VANCOCIN) IVPB 1000 mg/200 mL premix        1,000 mg 200 mL/hr over 60 Minutes Intravenous  Once 01/22/20 1212 01/22/20 1340   01/22/20 0845  clindamycin (CLEOCIN) capsule 450 mg        450 mg Oral  Once 01/22/20 0840 01/22/20 0900       I have personally reviewed the following labs and images: CBC: Recent Labs  Lab 01/22/20 0850 01/23/20 0502 01/24/20 0715 01/25/20 0349  WBC 20.7* 18.2* 10.9* 9.5  NEUTROABS 18.4*  --   --   --   HGB 13.9 12.6 12.3 12.1  HCT 41.5 39.4 37.3  37.4  MCV 95.4 99.7 95.6 96.4  PLT 254 254 242 256   BMP &GFR Recent Labs  Lab 01/22/20 1019 01/23/20 0502 01/24/20 0715 01/25/20 0349  NA 135 139 137 141  K 4.0 4.4 3.9 3.8  CL 102 105 106 104  CO2 23 25 23 27   GLUCOSE 118* 107* 119* 164*  BUN 11 11 10 10   CREATININE 0.99 1.03* 0.80 0.82  CALCIUM 8.1* 8.5* 8.4* 9.3  MG  --   --  2.1 2.0  PHOS  --   --  3.9 4.7*   Estimated Creatinine Clearance: 90.2 mL/min (by C-G formula based on SCr of 0.82 mg/dL). Liver & Pancreas: Recent Labs  Lab 01/22/20 1049 01/23/20 0502 01/24/20 0715 01/25/20 0349  AST 15 12*  --   --   ALT 13 11  --   --   ALKPHOS 59 57  --   --   BILITOT 0.7 0.8  --   --   PROT 6.0* 6.1*  --   --   ALBUMIN 3.0* 3.0* 2.9* 2.8*   No results for input(s): LIPASE, AMYLASE in the last 168 hours. No results for input(s): AMMONIA in the last 168 hours. Diabetic: Recent Labs    01/26/20 0635  HGBA1C 5.6   No results for input(s): GLUCAP in the last 168  hours. Cardiac Enzymes: No results for input(s): CKTOTAL, CKMB, CKMBINDEX, TROPONINI in the last 168 hours. No results for input(s): PROBNP in the last 8760 hours. Coagulation Profile: Recent Labs  Lab 01/22/20 1049  INR 1.0   Thyroid Function Tests: No results for input(s): TSH, T4TOTAL, FREET4, T3FREE, THYROIDAB in the last 72 hours. Lipid Profile: No results for input(s): CHOL, HDL, LDLCALC, TRIG, CHOLHDL, LDLDIRECT in the last 72 hours. Anemia Panel: No results for input(s): VITAMINB12, FOLATE, FERRITIN, TIBC, IRON, RETICCTPCT in the last 72 hours. Urine analysis:    Component Value Date/Time   COLORURINE YELLOW 01/22/2020 1029   APPEARANCEUR CLEAR 01/22/2020 1029   LABSPEC 1.015 01/22/2020 1029   PHURINE 8.0 01/22/2020 1029   GLUCOSEU NEGATIVE 01/22/2020 1029   HGBUR NEGATIVE 01/22/2020 [place] 01/22/2020 1029   KETONESUR NEGATIVE 01/22/2020 1029   PROTEINUR NEGATIVE 01/22/2020 1029   UROBILINOGEN 1.0 03/04/2015 1904   NITRITE NEGATIVE 01/22/2020 [place] 01/22/2020 1029   Sepsis Labs: Invalid input(s): PROCALCITONIN, [place]  Microbiology: Recent Results (from the past 240 hour(s))  Respiratory Panel by RT PCR (Flu A&B, Covid) - Nasopharyngeal Swab     Status: None   Collection Time: 01/22/20 10:19 AM   Specimen: Nasopharyngeal Swab  Result Value Ref Range Status   SARS Coronavirus 2 by RT PCR NEGATIVE NEGATIVE Final    Comment: (NOTE) SARS-CoV-2 target nucleic acids are NOT DETECTED.  The SARS-CoV-2 RNA is generally detectable in upper respiratoy specimens during the acute phase of infection. The lowest concentration of SARS-CoV-2 viral copies this assay can detect is 131 copies/mL. A negative result does not preclude SARS-Cov-2 infection and should not be used as the sole basis for treatment or other patient management decisions. A negative result may occur with  improper specimen collection/handling, submission of  specimen other than nasopharyngeal swab, presence of viral mutation(s) within the areas targeted by this assay, and inadequate number of viral copies (<131 copies/mL). A negative result must be combined with clinical observations, patient history, and epidemiological information. The expected result is Negative.  Fact Sheet for Patients:  PinkCheek.be  Fact Sheet for Healthcare Providers:  GravelBags.it  This test is no t yet approved or cleared by the [place] and  has been authorized for detection and/or diagnosis of SARS-CoV-2 by FDA under an Emergency Use Authorization (EUA). This EUA will remain  in effect (meaning this test can be used) for the duration of the COVID-19 declaration under Section 564(b)(1) of the Act, 21 U.S.C. section 360bbb-3(b)(1), unless the authorization is terminated or revoked sooner.     Influenza A by PCR NEGATIVE NEGATIVE Final   Influenza B by PCR NEGATIVE NEGATIVE Final    Comment: (NOTE) The Xpert Xpress SARS-CoV-2/FLU/RSV assay is intended as an aid in  the diagnosis of influenza from Nasopharyngeal swab specimens and  should not be used as a sole basis for treatment. Nasal washings and  aspirates are unacceptable for Xpert Xpress SARS-CoV-2/FLU/RSV  testing.  Fact Sheet for Patients: PinkCheek.be  Fact Sheet for Healthcare Providers: GravelBags.it  This test is not yet approved or cleared by the [place] FDA and  has been authorized for detection and/or diagnosis of SARS-CoV-2 by  FDA under an Emergency Use Authorization (EUA). This EUA will remain  in effect (meaning this test can be used) for the duration of the  Covid-19 declaration under Section 564(b)(1) of the Act, 21  U.S.C. section 360bbb-3(b)(1), unless the authorization is  terminated or revoked. Performed at [facility], [place]  [address], [place], [place] [address]   Urine culture     Status: Abnormal   Collection Time: 01/22/20 10:29 AM   Specimen: Urine, Clean Catch  Result Value Ref Range Status   Specimen Description   Final    URINE, CLEAN CATCH Performed at [facility], [place] [address], [place], [place] [address]    Special Requests   Final    NONE Performed at [facility], [place] [address] [place], [place] [address]    Culture (A)  Final    <10,000 COLONIES/mL INSIGNIFICANT GROWTH Performed at [facility] [address] [place], [place] [address]    Report Status 01/23/2020 FINAL  Final  Blood culture (routine single)     Status: None (Preliminary result)   Collection Time: 01/22/20 10:49 AM   Specimen: BLOOD  Result Value Ref Range Status   Specimen Description   Final    BLOOD BLOOD LEFT FOREARM Performed at [facility] [address], [place], [place] [address]    Special Requests   Final    BOTTLES DRAWN AEROBIC AND ANAEROBIC Blood Culture adequate volume Performed at [facility] [address], [place], [place] [address]    Culture   Final    NO GROWTH 4 DAYS Performed at [facility], [place] [address], [place], [place] [address]    Report Status PENDING  Incomplete  Wound or Superficial Culture     Status: None   Collection Time: 01/22/20  3:22 PM   Specimen: Abscess; Wound  Result Value Ref Range Status   Specimen Description   Final    ABSCESS Performed at [facility] [address] [place], [place] [address]    Special Requests   Final    NONE Performed at [facility], [place] [address], [place], [place] [address]    Gram Stain   Final    MODERATE WBC PRESENT, PREDOMINANTLY PMN FEW GRAM POSITIVE COCCI Performed at [facility], [place] [address], [place], [place] [address]  Culture RARE METHICILLIN RESISTANT STAPHYLOCOCCUS AUREUS  Final     Report Status 01/24/2020 FINAL  Final   Organism ID, Bacteria METHICILLIN RESISTANT STAPHYLOCOCCUS AUREUS  Final      Susceptibility   Methicillin resistant staphylococcus aureus - MIC*    CIPROFLOXACIN >=8 RESISTANT Resistant     ERYTHROMYCIN >=8 RESISTANT Resistant     GENTAMICIN <=0.5 SENSITIVE Sensitive     OXACILLIN >=4 RESISTANT Resistant     TETRACYCLINE <=1 SENSITIVE Sensitive     VANCOMYCIN 1 SENSITIVE Sensitive     TRIMETH/SULFA <=10 SENSITIVE Sensitive     CLINDAMYCIN <=0.25 SENSITIVE Sensitive     RIFAMPIN <=0.5 SENSITIVE Sensitive     Inducible Clindamycin NEGATIVE Sensitive     * RARE METHICILLIN RESISTANT STAPHYLOCOCCUS AUREUS  Culture, blood (routine x 2)     Status: None (Preliminary result)   Collection Time: 01/25/20 11:33 AM   Specimen: BLOOD LEFT HAND  Result Value Ref Range Status   Specimen Description   Final    BLOOD LEFT HAND Performed at [facility] [address], [place], [place] [address]    Special Requests   Final    BOTTLES DRAWN AEROBIC AND ANAEROBIC Blood Culture adequate volume Performed at [facility] [address], [place], [place] [address]    Culture   Final    NO GROWTH < 24 HOURS Performed at [facility] [address] [place], [place] [address]    Report Status PENDING  Incomplete  Culture, blood (routine x 2)     Status: None (Preliminary result)   Collection Time: 01/25/20 11:33 AM   Specimen: BLOOD  Result Value Ref Range Status   Specimen Description   Final    BLOOD LEFT ANTECUBITAL Performed at [facility], [place] [address], [place], [place] [address]    Special Requests   Final    BOTTLES DRAWN AEROBIC AND ANAEROBIC Blood Culture adequate volume Performed at [facility] [address] [place], [place] [address]    Culture   Final    NO GROWTH < 24 HOURS Performed at [facility] [address] [place], [place] [address]    Report  Status PENDING  Incomplete    Radiology Studies: ECHOCARDIOGRAM COMPLETE  Result Date: 01/25/2020    ECHOCARDIOGRAM REPORT   Patient Name:   KESLYN TEATER Routon Date of Exam: 01/25/2020 Medical Rec #:  [id number]            Height:       68.0 in Accession #:    [id number]           Weight:       220.0 lb Date of Birth:  Sep 26, 1960            BSA:          2.128 m Patient Age:    82 years             BP:           109/59 mmHg Patient Gender: F                    HR:           80 bpm. Exam Location:  Inpatient Procedure: 2D Echo, Cardiac Doppler and Color Doppler Indications:    Orthostatic hypotension.  History:        Patient has prior history of Echocardiogram examinations, most  recent 09/21/2016. Signs/Symptoms:Fever, Bacteremia, Chest Pain                 and Hypotension; Risk Factors:Current Smoker. Opioid overdose.                 Cocaine use.  Sonographer:    Roseanna Rainbow RDCS Referring Phys: [id number] Charlesetta Ivory Marik Sedore  Sonographer Comments: Technically difficult study due to poor echo windows. Image acquisition challenging due to patient body habitus. IMPRESSIONS  1. Left ventricular ejection fraction, by estimation, is 60 to 65%. The left ventricle has normal function. The left ventricle has no regional wall motion abnormalities. There is mild left ventricular hypertrophy. Left ventricular diastolic parameters were normal.  2. Right ventricular systolic function is normal. The right ventricular size is normal.  3. The mitral valve is normal in structure. Trivial mitral valve regurgitation. No evidence of mitral stenosis.  4. The aortic valve is tricuspid. Aortic valve regurgitation is not visualized. Mild aortic valve sclerosis is present, with no evidence of aortic valve stenosis.  5. The inferior vena cava is normal in size with greater than 50% respiratory variability, suggesting right atrial pressure of 3 mmHg. FINDINGS  Left Ventricle: Left ventricular ejection fraction, by estimation, is 60  to 65%. The left ventricle has normal function. The left ventricle has no regional wall motion abnormalities. The left ventricular internal cavity size was normal in size. There is  mild left ventricular hypertrophy. Left ventricular diastolic parameters were normal. Right Ventricle: The right ventricular size is normal. No increase in right ventricular wall thickness. Right ventricular systolic function is normal. Left Atrium: Left atrial size was normal in size. Right Atrium: Right atrial size was normal in size. Pericardium: There is no evidence of pericardial effusion. Mitral Valve: The mitral valve is normal in structure. Trivial mitral valve regurgitation. No evidence of mitral valve stenosis. Tricuspid Valve: The tricuspid valve is normal in structure. Tricuspid valve regurgitation is trivial. No evidence of tricuspid stenosis. Aortic Valve: The aortic valve is tricuspid. Aortic valve regurgitation is not visualized. Mild aortic valve sclerosis is present, with no evidence of aortic valve stenosis. Pulmonic Valve: The pulmonic valve was normal in structure. Pulmonic valve regurgitation is not visualized. No evidence of pulmonic stenosis. Aorta: The aortic root is normal in size and structure. Venous: The inferior vena cava is normal in size with greater than 50% respiratory variability, suggesting right atrial pressure of 3 mmHg. IAS/Shunts: The interatrial septum was not well visualized.  LEFT VENTRICLE PLAX 2D LVIDd:         3.90 cm     Diastology LVIDs:         2.30 cm     LV e' medial:    6.64 cm/s LV PW:         1.30 cm     LV E/e' medial:  12.0 LV IVS:        1.50 cm     LV e' lateral:   8.92 cm/s LVOT diam:     2.20 cm     LV E/e' lateral: 9.0 LV SV:         82 LV SV Index:   39 LVOT Area:     3.80 cm  LV Volumes (MOD) LV vol d, MOD A2C: 78.1 ml LV vol d, MOD A4C: 65.0 ml LV vol s, MOD A2C: 25.6 ml LV vol s, MOD A4C: 20.7 ml LV SV MOD A2C:     52.5 ml LV SV MOD A4C:  65.0 ml LV SV MOD BP:      49.7  ml RIGHT VENTRICLE             IVC RV S prime:     15.20 cm/s  IVC diam: 2.10 cm TAPSE (M-mode): 2.1 cm LEFT ATRIUM             Index       RIGHT ATRIUM           Index LA diam:        3.30 cm 1.55 cm/m  RA Area:     13.80 cm LA Vol (A2C):   44.3 ml 20.82 ml/m RA Volume:   30.50 ml  14.33 ml/m LA Vol (A4C):   27.8 ml 13.06 ml/m LA Biplane Vol: 37.3 ml 17.53 ml/m  AORTIC VALVE LVOT Vmax:   115.00 cm/s LVOT Vmean:  76.500 cm/s LVOT VTI:    0.217 m  AORTA Ao Root diam: 3.50 cm Ao Asc diam:  3.40 cm MITRAL VALVE MV Area (PHT): 2.60 cm    SHUNTS MV Decel Time: 292 msec    Systemic VTI:  0.22 m MV E velocity: 80.00 cm/s  Systemic Diam: 2.20 cm MV A velocity: 91.70 cm/s MV E/A ratio:  0.87 Jenkins Rouge MD Electronically signed by Jenkins Rouge MD Signature Date/Time: 01/25/2020/4:13:34 PM    Final      Quana Chamberlain T. [place]  If 7PM-7AM, please contact night-coverage [email or web address] 01/26/2020, 1:14 PM

## 2020-01-27 ENCOUNTER — Other Ambulatory Visit (HOSPITAL_COMMUNITY): Payer: Self-pay | Admitting: Internal Medicine

## 2020-01-27 LAB — RENAL FUNCTION PANEL
Albumin: 2.9 g/dL — ABNORMAL LOW (ref 3.5–5.0)
Anion gap: 8 (ref 5–15)
BUN: 16 mg/dL (ref 6–20)
CO2: 25 mmol/L (ref 22–32)
Calcium: 8.8 mg/dL — ABNORMAL LOW (ref 8.9–10.3)
Chloride: 106 mmol/L (ref 98–111)
Creatinine, Ser: 0.85 mg/dL (ref 0.44–1.00)
GFR calc non Af Amer: >60 mL/min (ref >60)
Glucose, Bld: 98 mg/dL (ref 70–99)
Phosphorus: 4.1 mg/dL (ref 2.5–4.6)
Potassium: 3.5 mmol/L (ref 3.5–5.1)
Sodium: 139 mmol/L (ref 135–145)

## 2020-01-27 LAB — CBC
HCT: 36.9 % (ref 36.0–46.0)
Hemoglobin: 12 g/dL (ref 12.0–15.0)
MCH: 31.3 pg (ref 26.0–34.0)
MCHC: 32.5 g/dL (ref 30.0–36.0)
MCV: 96.1 fL (ref 80.0–100.0)
Platelets: 281 10*3/uL (ref 150–400)
RBC: 3.84 MIL/uL — ABNORMAL LOW (ref 3.87–5.11)
RDW: 12.5 % (ref 11.5–15.5)
WBC: 11.4 10*3/uL — ABNORMAL HIGH (ref 4.0–10.5)
nRBC: 0 % (ref 0.0–0.2)

## 2020-01-27 LAB — CULTURE, BLOOD (SINGLE)
Culture: NO GROWTH
Special Requests: ADEQUATE

## 2020-01-27 LAB — HSV(HERPES SIMPLEX VRS) I + II AB-IGM: HSVI/II Comb IgM: <0.91 Ratio (ref 0.00–0.90)

## 2020-01-27 LAB — MAGNESIUM: Magnesium: 2.3 mg/dL (ref 1.7–2.4)

## 2020-01-27 LAB — ACTH: C206 ACTH: 21.7 pg/mL (ref 7.2–63.3)

## 2020-01-27 MED ORDER — DOXYCYCLINE MONOHYDRATE 100 MG PO TABS: 100.0000 mg | ORAL_TABLET | Freq: Two times a day (BID) | ORAL | 0 refills | Status: AC

## 2020-01-27 MED ORDER — SODIUM CHLORIDE 0.9% FLUSH: 10.0000 mL | Freq: Two times a day (BID) | INTRAVENOUS | Status: DC

## 2020-01-27 MED ORDER — SODIUM CHLORIDE 0.9% FLUSH: 10.0000 mL | INTRAVENOUS | Status: DC | PRN

## 2020-01-27 MED ORDER — ONDANSETRON 4 MG PO TBDP: 4.0000 mg | ORAL_TABLET | Freq: Three times a day (TID) | ORAL | 0 refills | Status: AC | PRN

## 2020-01-27 MED ORDER — POLYVINYL ALCOHOL 1.4 % OP SOLN
1.0000 [drp] | OPHTHALMIC | Status: DC | PRN
  Administered 2020-01-27: 1 [drp] via OPHTHALMIC
  Filled 2020-01-27: qty 15

## 2020-01-27 NOTE — Discharge Summary (Addendum)
Physician Discharge Summary  Kelli Jenkins TOI:[id number] DOB: 03-24-1961 DOA: 01/22/2020  PCP: Patient, No Pcp Per  Admit date: 01/22/2020 Discharge date: 01/27/2020  Admitted From: [facility]  Recommendations for Outpatient Follow-up:  1. Follow up with PCP in 1-2 weeks 2. Please obtain BMP/CBC in one week 3. Please follow up on the following pending results:  Home Health:no  Equipment/Devices: none  Discharge Condition: Stable Code Status:   Code Status: Full Code Diet recommendation:  Diet Order            Diet - low sodium heart healthy           Diet regular Room service appropriate? Yes; Fluid consistency: Thin  Diet effective now                  Brief/Interim Summary:  59 year-old homeless female with history of polysubstance use (cocaine, marijuana and tobacco) presenting with fever, chills, skin redness and pain over left lateral abdominal wall, and admitted for severe sepsis due to left lateral abdominal wall cellulitis.   In ED, she was febrile, tachycardic, tachypneic.WBC 20.7 with left shift.  Lactic acid normal.  UA without significant finding.  Influenza and COVID-19 PCR negative.  UDS positive for cocaine.  EDP trained bedside I&D.  Blood and tissue culture obtained.  She was a started on IV vancomycin.  Superficial wound culture with MRSA but sensitive to tetracycline.  She was transitioned to IV doxycycline on 01/24/2020.  Initial and repeat blood cultures NGTD.  Patient has orthostatic hypotension and continues to feel poorly with nausea and dizziness.  Echocardiogram without significant finding.  TSH normal.  Morning cortisol on low side of normal. She had cosyntropin test and was normal.  Orthostatic vitals negative and hemodynamically stable. She had orthostatic hypotension now resolved.  Initially feeling poorly at this time feeling much better and improved and at baseline.  She has been ambulating.  At this time  hemodynamically stable left abdomen wall cellulitis much improved and she is on IV doxycycline patient will transition to p.o. doxycycline for discharge to home in a shelter and will discharge with medications w/ TOC help   Discharge Diagnoses:  Principal Problem:  Severe sepsis: Due to abdominal wall cellulitis. sepsis resolved.Initial and repeat blood cultures NGTD.  No stigmata of endocarditis.  TTE without significant finding.  Denies IVDU.    Cellulitis nicely improving with minimal mild induration and redness.  Cosyntropin test negative.  Orthostatic vitals are stable. Was on  -IV vancomycin 10/1-10/3.  IV doxycycline 10/3>>  no more nausea vomiting tolerating diet.  Will switch to p.o. doxycycline and discharged home.  She was seen by wound care will change to p.o doxycycline for discharge.  Continue to clean with soap and water to cleanse, pat dry and followed by a thin application of 1% hydrocortisone cream.  Nausea and emesis have resolved. Will rx prn zofran for her .  Orthostatic hypotension- negative cosyntropin test.  Blood pressure stable.  Cocaine abuse/polysubstance abuse homelessness smoker UDS positive for cocaine counseled on smoking and cocaine cessation, nicotine patch, TOC was involved to help with resources  Dental caries/gingivitis she will need outpatient follow-up with dental services.  Continue good dental hygiene, mouthwash.  Homelessness: Shelter is arranged by [facility].  Obesity with BMI 33 will benefit with weight loss  Consults: PT OT, TOC,wound nurse  Subjective: Alert awake oriented no new complaints.  Left abdominal wall redness and swelling improved  Discharge Exam: Vitals:   01/27/20  1126 01/27/20 1128  BP: 105/69 108/70  Pulse: 81 93  Resp: 16 (!) 22  Temp: 97.8 F (36.6 C) 97.7 F (36.5 C)  SpO2: 97% 98%   General: Pt is alert, awake, not in acute distress Cardiovascular: RRR, S1/S2 +, no rubs, no gallops Respiratory: CTA bilaterally,  no wheezing, no rhonchi Abdominal: Soft, NT, ND, bowel sounds + Extremities: no edema, no cyanosis  Discharge Instructions  Discharge Instructions    Diet - low sodium heart healthy   Complete by: As directed    Discharge instructions   Complete by: As directed    Follow-up with primary care doctor and wellness center in 1 week or if there is worsening pain or redness or swelling on the infection site. Please call call MD or return to ER for similar or worsening recurring problem that brought you to hospital or if any fever,nausea/vomiting,abdominal pain, uncontrolled pain, chest pain,  shortness of breath or any other alarming symptoms.  Please follow-up your doctor as instructed in a week time and call the office for appointment.  Please avoid alcohol, smoking, or any other illicit substance and maintain healthy habits including taking your regular medications as prescribed.  You were cared for by a hospitalist during your hospital stay. If you have any questions about your discharge medications or the care you received while you were in the hospital after you are discharged, you can call the unit and ask to speak with the hospitalist on call if the hospitalist that took care of you is not available.  Once you are discharged, your primary care physician will handle any further medical issues. Please note that NO REFILLS for any discharge medications will be authorized once you are discharged, as it is imperative that you return to your primary care physician (or establish a relationship with a primary care physician if you do not have one) for your aftercare needs so that they can reassess your need for medications and monitor your lab values   Discharge wound care:   Complete by: As directed    Wash affected areas on trunk with soap and water, rinse and pat dry. Apply hydrocortisone cream in a thin layer to lesions, leave open to air.   Increase activity slowly   Complete by: As directed       Allergies as of 01/27/2020      Reactions   Darvocet [propoxyphene N-acetaminophen] Swelling   OK to take acetaminophen   Ibuprofen Swelling   Ok to take ketorolac   Tramadol Swelling      Medication List    STOP taking these medications   levofloxacin 500 MG tablet Commonly known as: LEVAQUIN   methocarbamol 500 MG tablet Commonly known as: ROBAXIN   pantoprazole 40 MG tablet Commonly known as: PROTONIX   promethazine 12.5 MG tablet Commonly known as: PHENERGAN     TAKE these medications   aspirin-acetaminophen-caffeine 588-502-77 MG tablet Commonly known as: EXCEDRIN MIGRAINE Take 3 tablets by mouth every 6 (six) hours as needed for headache.   doxycycline 100 MG tablet Commonly known as: ADOXA Take 1 tablet (100 mg total) by mouth 2 (two) times daily for 5 days.   ondansetron 4 MG disintegrating tablet Commonly known as: Zofran ODT Take 1 tablet (4 mg total) by mouth every 8 (eight) hours as needed for up to 20 days.            Discharge Care Instructions  (From admission, onward)  Start     Ordered   01/27/20 0000  Discharge wound care:       Comments: Wash affected areas on trunk with soap and water, rinse and pat dry. Apply hydrocortisone cream in a thin layer to lesions, leave open to air.   01/27/20 1155          Follow-up Information    [facility]. Go on 02/04/2020.   Why: Appointment scheduled at 2:30PM. Please arrive 15 minutes early.  Contact information: [place] [phone]             Allergies  Allergen Reactions  . Darvocet [Propoxyphene N-Acetaminophen] Swelling    OK to take acetaminophen  . Ibuprofen Swelling    Ok to take ketorolac  . Tramadol Swelling    The results of significant diagnostics from this hospitalization (including imaging, microbiology, ancillary and laboratory) are listed below for reference.     Microbiology: Recent Results (from the past 240 hour(s))  Respiratory Panel by RT PCR (Flu A&B, Covid) - Nasopharyngeal Swab     Status: None   Collection Time: 01/22/20 10:19 AM   Specimen: Nasopharyngeal Swab  Result Value Ref Range Status   SARS Coronavirus 2 by RT PCR NEGATIVE NEGATIVE Final    Comment: (NOTE) SARS-CoV-2 target nucleic acids are NOT DETECTED.  The SARS-CoV-2 RNA is generally detectable in upper respiratoy specimens during the acute phase of infection. The lowest concentration of SARS-CoV-2 viral copies this assay can detect is 131 copies/mL. A negative result does not preclude SARS-Cov-2 infection and should not be used as the sole basis for treatment or other patient management decisions. A negative result may occur with  improper specimen collection/handling, submission of specimen other than nasopharyngeal swab, presence of viral mutation(s) within the areas targeted by this assay, and inadequate number of viral copies (<131 copies/mL). A negative result must be combined with clinical observations, patient history, and epidemiological information. The expected result is Negative.  Fact Sheet for Patients:  PinkCheek.be  Fact Sheet for Healthcare Providers:  GravelBags.it  This test is no t yet approved or cleared by the [place] FDA and  has been authorized for detection and/or diagnosis of SARS-CoV-2 by FDA under an Emergency Use Authorization (EUA). This EUA will remain  in effect (meaning this test can be used) for the duration of the COVID-19 declaration under Section 564(b)(1) of the Act, 21 U.S.C. section 360bbb-3(b)(1), unless the authorization is terminated or revoked sooner.     Influenza A by PCR NEGATIVE NEGATIVE Final   Influenza B by PCR NEGATIVE NEGATIVE Final    Comment: (NOTE) The Xpert Xpress SARS-CoV-2/FLU/RSV assay is intended as an aid in  the diagnosis of influenza  from Nasopharyngeal swab specimens and  should not be used as a sole basis for treatment. Nasal washings and  aspirates are unacceptable for Xpert Xpress SARS-CoV-2/FLU/RSV  testing.  Fact Sheet for Patients: PinkCheek.be  Fact Sheet for Healthcare Providers: GravelBags.it  This test is not yet approved or cleared by the [place] FDA and  has been authorized for detection and/or diagnosis of SARS-CoV-2 by  FDA under an Emergency Use Authorization (EUA). This EUA will remain  in effect (meaning this test can be used) for the duration of the  Covid-19 declaration under Section 564(b)(1) of the Act, 21  U.S.C. section 360bbb-3(b)(1), unless the authorization is  terminated or revoked. Performed at [facility], [place]  [place], [place] [address]   Urine culture     Status: Abnormal   Collection Time: 01/22/20 10:29 AM   Specimen: Urine, Clean Catch  Result Value Ref Range Status   Specimen Description   Final    URINE, CLEAN CATCH Performed at [facility], [place] [address], [place], [place] [address]    Special Requests   Final    NONE Performed at [facility], [place] [address] [place], [place] [address]    Culture (A)  Final    <10,000 COLONIES/mL INSIGNIFICANT GROWTH Performed at [facility] [address] [place], [place] [address]    Report Status 01/23/2020 FINAL  Final  Blood culture (routine single)     Status: None   Collection Time: 01/22/20 10:49 AM   Specimen: BLOOD  Result Value Ref Range Status   Specimen Description   Final    BLOOD BLOOD LEFT FOREARM Performed at [facility] [address] [place], [place] [address]    Special Requests   Final    BOTTLES DRAWN AEROBIC AND ANAEROBIC Blood Culture adequate volume Performed at [facility] [address] [place], [place] [address]     Culture   Final    NO GROWTH 5 DAYS Performed at [facility], [place] [address] [place], [place] [address]    Report Status 01/27/2020 FINAL  Final  Wound or Superficial Culture     Status: None   Collection Time: 01/22/20  3:22 PM   Specimen: Abscess; Wound  Result Value Ref Range Status   Specimen Description   Final    ABSCESS Performed at [facility] [address], [place], [place] [address]    Special Requests   Final    NONE Performed at [facility], [place] [address], [place], [place] [address]    Gram Stain   Final    MODERATE WBC PRESENT, PREDOMINANTLY PMN FEW GRAM POSITIVE COCCI Performed at [facility], [place] [address] [place], [place] [address]    Culture RARE METHICILLIN RESISTANT STAPHYLOCOCCUS AUREUS  Final   Report Status 01/24/2020 FINAL  Final   Organism ID, Bacteria METHICILLIN RESISTANT STAPHYLOCOCCUS AUREUS  Final      Susceptibility   Methicillin resistant staphylococcus aureus - MIC*    CIPROFLOXACIN >=8 RESISTANT Resistant     ERYTHROMYCIN >=8 RESISTANT Resistant     GENTAMICIN <=0.5 SENSITIVE Sensitive     OXACILLIN >=4 RESISTANT Resistant     TETRACYCLINE <=1 SENSITIVE Sensitive     VANCOMYCIN 1 SENSITIVE Sensitive     TRIMETH/SULFA <=10 SENSITIVE Sensitive     CLINDAMYCIN <=0.25 SENSITIVE Sensitive     RIFAMPIN <=0.5 SENSITIVE Sensitive     Inducible Clindamycin NEGATIVE Sensitive     * RARE METHICILLIN RESISTANT STAPHYLOCOCCUS AUREUS  Culture, blood (routine x 2)     Status: None (Preliminary result)   Collection Time: 01/25/20 11:33 AM   Specimen: BLOOD LEFT HAND  Result Value Ref Range Status   Specimen Description   Final    BLOOD LEFT HAND Performed at [facility] [address], [place], [place] [address]    Special Requests   Final    BOTTLES DRAWN AEROBIC AND ANAEROBIC Blood Culture adequate volume Performed at [facility] [address] [place], [place] [address]    Culture   Final    NO GROWTH 2 DAYS Performed at [facility], [place] [address], [place], [place] [address]    Report Status PENDING  Incomplete  Culture, blood (routine x 2)     Status: None (Preliminary result)   Collection Time: 01/25/20 11:33 AM   Specimen: BLOOD  Result Value Ref Range Status   Specimen Description   Final    BLOOD LEFT ANTECUBITAL Performed at [facility], [place] [address], [place], [place] [address]    Special Requests   Final    BOTTLES DRAWN AEROBIC AND ANAEROBIC Blood Culture adequate volume Performed at [facility] [address], [place], [place] [address]    Culture   Final    NO GROWTH 2 DAYS Performed at [facility] [address], [place], [place] [address]    Report Status PENDING  Incomplete    Procedures/Studies: DG Chest 2 View  Result Date: 01/22/2020 CLINICAL DATA:  Shortness of breath, COPD. EXAM: CHEST - 2 VIEW COMPARISON:  Sep 07, 2019. FINDINGS: The heart size and mediastinal contours are within normal limits. Both lungs are clear. No pneumothorax or pleural effusion is noted. The visualized skeletal structures are unremarkable. IMPRESSION: No active cardiopulmonary disease. Electronically Signed   By: Marijo Conception M.D.   On: 01/22/2020 08:36   CT Abdomen Pelvis W Contrast  Result Date: 01/22/2020 CLINICAL DATA:  Abdominal pain and fever. Four days post hysterectomy EXAM: CT ABDOMEN AND PELVIS WITH CONTRAST TECHNIQUE: Multidetector CT imaging of the abdomen and pelvis was performed using the standard protocol following bolus administration of intravenous contrast. CONTRAST:  165mL OMNIPAQUE IOHEXOL 300 MG/ML  SOLN COMPARISON:  CT abdomen and pelvis June 18, 2019 FINDINGS: Lower chest: There is bibasilar atelectasis. Hepatobiliary: There is hepatic steatosis. No focal liver lesions are appreciable. Gallbladder wall is not appreciably thickened. There is no evident  biliary duct dilatation. Pancreas: There is no pancreatic mass or inflammatory focus. Spleen: No splenic lesions are evident. Adrenals/Urinary Tract: Adrenals appear normal bilaterally. There is a 7 x 7 mm cyst in the lower pole left kidney. There is no hydronephrosis on either side. There is a 1 mm calculus in the lower pole of the right kidney. There is no appreciable ureteral calculus on either side. The urinary bladder is midline with wall thickness within normal limits. Stomach/Bowel: There is no appreciable bowel wall or mesenteric thickening. There is no demonstrable bowel obstruction. The terminal ileum appears normal. There is no appreciable free air or portal venous air. Vascular/Lymphatic: There is no abdominal aortic aneurysm. There are foci of aortic and iliac artery atherosclerosis. Major venous structures appear patent. No adenopathy is appreciable in the abdomen or pelvis. Reproductive: Uterus absent.  No pelvic mass evident. Other: Appendix appears normal. No abscess or ascites is evident in the abdomen or pelvis. There is soft tissue stranding and thickening in the left lateral lower abdominal/upper pelvic wall region. There is minimal fat in the umbilicus. Musculoskeletal: There are foci of degenerative change in the lumbar spine. No blastic or lytic bone lesions. No intramuscular lesions are evident. IMPRESSION: 1. Lateral upper pelvic wall soft tissue thickening and stranding. Question traumatic etiology for this appearance. No well-defined fluid collection or abscess in this area. 2. No abnormal fluid or abscess in the abdomen or pelvis. Appendix appears normal. No bowel wall thickening or bowel obstruction. 3. 1 mm nonobstructing calculus lower pole right kidney. No hydronephrosis on either side. No ureteral calculus on either side. Urinary bladder wall thickness normal. 4.  Hepatic steatosis.  5.  Uterus absent. 6.  Aortic Atherosclerosis (ICD10-I70.0). Electronically Signed   By: Lowella Grip III M.D.   On: 01/22/2020 11:49   ECHOCARDIOGRAM COMPLETE  Result Date: 01/25/2020    ECHOCARDIOGRAM REPORT   Patient Name:   Kelli Jenkins Benavides Date of Exam: 01/25/2020 Medical Rec #:  [id number]            Height:       68.0 in Accession #:    [id number]           Weight:       220.0 lb Date of Birth:  08/02/1960            BSA:          2.128 m Patient Age:    14 years             BP:           109/59 mmHg Patient Gender: F                    HR:           80 bpm. Exam Location:  Inpatient Procedure: 2D Echo, Cardiac Doppler and Color Doppler Indications:    Orthostatic hypotension.  History:        Patient has prior history of Echocardiogram examinations, most                 recent 09/21/2016. Signs/Symptoms:Fever, Bacteremia, Chest Pain                 and Hypotension; Risk Factors:Current Smoker. Opioid overdose.                 Cocaine use.  Sonographer:    Roseanna Rainbow RDCS Referring Phys: [id number] Charlesetta Ivory GONFA  Sonographer Comments: Technically difficult study due to poor echo windows. Image acquisition challenging due to patient body habitus. IMPRESSIONS  1. Left ventricular ejection fraction, by estimation, is 60 to 65%. The left ventricle has normal function. The left ventricle has no regional wall motion abnormalities. There is mild left ventricular hypertrophy. Left ventricular diastolic parameters were normal.  2. Right ventricular systolic function is normal. The right ventricular size is normal.  3. The mitral valve is normal in structure. Trivial mitral valve regurgitation. No evidence of mitral stenosis.  4. The aortic valve is tricuspid. Aortic valve regurgitation is not visualized. Mild aortic valve sclerosis is present, with no evidence of aortic valve stenosis.  5. The inferior vena cava is normal in size with greater than 50% respiratory variability, suggesting right atrial pressure of 3 mmHg. FINDINGS  Left Ventricle: Left ventricular ejection fraction, by estimation, is 60 to 65%.  The left ventricle has normal function. The left ventricle has no regional wall motion abnormalities. The left ventricular internal cavity size was normal in size. There is  mild left ventricular hypertrophy. Left ventricular diastolic parameters were normal. Right Ventricle: The right ventricular size is normal. No increase in right ventricular wall thickness. Right ventricular systolic function is normal. Left Atrium: Left atrial size was normal in size. Right Atrium: Right atrial size was normal in size. Pericardium: There is no evidence of pericardial effusion. Mitral Valve: The mitral valve is normal in structure. Trivial mitral valve regurgitation. No evidence of mitral valve stenosis. Tricuspid Valve: The tricuspid valve is normal in structure. Tricuspid valve regurgitation is trivial. No evidence of tricuspid stenosis. Aortic Valve: The aortic valve is tricuspid. Aortic valve regurgitation is not visualized. Mild  aortic valve sclerosis is present, with no evidence of aortic valve stenosis. Pulmonic Valve: The pulmonic valve was normal in structure. Pulmonic valve regurgitation is not visualized. No evidence of pulmonic stenosis. Aorta: The aortic root is normal in size and structure. Venous: The inferior vena cava is normal in size with greater than 50% respiratory variability, suggesting right atrial pressure of 3 mmHg. IAS/Shunts: The interatrial septum was not well visualized.  LEFT VENTRICLE PLAX 2D LVIDd:         3.90 cm     Diastology LVIDs:         2.30 cm     LV e' medial:    6.64 cm/s LV PW:         1.30 cm     LV E/e' medial:  12.0 LV IVS:        1.50 cm     LV e' lateral:   8.92 cm/s LVOT diam:     2.20 cm     LV E/e' lateral: 9.0 LV SV:         82 LV SV Index:   39 LVOT Area:     3.80 cm  LV Volumes (MOD) LV vol d, MOD A2C: 78.1 ml LV vol d, MOD A4C: 65.0 ml LV vol s, MOD A2C: 25.6 ml LV vol s, MOD A4C: 20.7 ml LV SV MOD A2C:     52.5 ml LV SV MOD A4C:     65.0 ml LV SV MOD BP:      49.7 ml  RIGHT VENTRICLE             IVC RV S prime:     15.20 cm/s  IVC diam: 2.10 cm TAPSE (M-mode): 2.1 cm LEFT ATRIUM             Index       RIGHT ATRIUM           Index LA diam:        3.30 cm 1.55 cm/m  RA Area:     13.80 cm LA Vol (A2C):   44.3 ml 20.82 ml/m RA Volume:   30.50 ml  14.33 ml/m LA Vol (A4C):   27.8 ml 13.06 ml/m LA Biplane Vol: 37.3 ml 17.53 ml/m  AORTIC VALVE LVOT Vmax:   115.00 cm/s LVOT Vmean:  76.500 cm/s LVOT VTI:    0.217 m  AORTA Ao Root diam: 3.50 cm Ao Asc diam:  3.40 cm MITRAL VALVE MV Area (PHT): 2.60 cm    SHUNTS MV Decel Time: 292 msec    Systemic VTI:  0.22 m MV E velocity: 80.00 cm/s  Systemic Diam: 2.20 cm MV A velocity: 91.70 cm/s MV E/A ratio:  0.87 Jenkins Rouge MD Electronically signed by Jenkins Rouge MD Signature Date/Time: 01/25/2020/4:13:34 PM    Final     Labs: BNP (last 3 results) No results for input(s): BNP in the last 8760 hours. Basic Metabolic Panel: Recent Labs  Lab 01/22/20 1019 01/23/20 0502 01/24/20 0715 01/25/20 0349 01/27/20 0326  NA 135 139 137 141 139  K 4.0 4.4 3.9 3.8 3.5  CL 102 105 106 104 106  CO2 23 25 23 27 25   GLUCOSE 118* 107* 119* 164* 98  BUN 11 11 10 10 16   CREATININE 0.99 1.03* 0.80 0.82 0.85  CALCIUM 8.1* 8.5* 8.4* 9.3 8.8*  MG  --   --  2.1 2.0 2.3  PHOS  --   --  3.9 4.7* 4.1   Liver Function  Tests: Recent Labs  Lab 01/22/20 1049 01/23/20 0502 01/24/20 0715 01/25/20 0349 01/27/20 0326  AST 15 12*  --   --   --   ALT 13 11  --   --   --   ALKPHOS 59 57  --   --   --   BILITOT 0.7 0.8  --   --   --   PROT 6.0* 6.1*  --   --   --   ALBUMIN 3.0* 3.0* 2.9* 2.8* 2.9*   No results for input(s): LIPASE, AMYLASE in the last 168 hours. No results for input(s): AMMONIA in the last 168 hours. CBC: Recent Labs  Lab 01/22/20 0850 01/23/20 0502 01/24/20 0715 01/25/20 0349 01/27/20 0326  WBC 20.7* 18.2* 10.9* 9.5 11.4*  NEUTROABS 18.4*  --   --   --   --   HGB 13.9 12.6 12.3 12.1 12.0  HCT 41.5 39.4 37.3 37.4  36.9  MCV 95.4 99.7 95.6 96.4 96.1  PLT 254 254 242 256 281   Cardiac Enzymes: No results for input(s): CKTOTAL, CKMB, CKMBINDEX, TROPONINI in the last 168 hours. BNP: Invalid input(s): POCBNP CBG: No results for input(s): GLUCAP in the last 168 hours. D-Dimer No results for input(s): DDIMER in the last 72 hours. Hgb A1c Recent Labs    01/26/20 0635  HGBA1C 5.6   Lipid Profile No results for input(s): CHOL, HDL, LDLCALC, TRIG, CHOLHDL, LDLDIRECT in the last 72 hours. Thyroid function studies No results for input(s): TSH, T4TOTAL, T3FREE, THYROIDAB in the last 72 hours.  Invalid input(s): FREET3 Anemia work up No results for input(s): VITAMINB12, FOLATE, FERRITIN, TIBC, IRON, RETICCTPCT in the last 72 hours. Urinalysis    Component Value Date/Time   COLORURINE YELLOW 01/22/2020 1029   APPEARANCEUR CLEAR 01/22/2020 1029   LABSPEC 1.015 01/22/2020 1029   PHURINE 8.0 01/22/2020 1029   GLUCOSEU NEGATIVE 01/22/2020 1029   HGBUR NEGATIVE 01/22/2020 [place] 01/22/2020 1029   KETONESUR NEGATIVE 01/22/2020 1029   PROTEINUR NEGATIVE 01/22/2020 1029   UROBILINOGEN 1.0 03/04/2015 1904   NITRITE NEGATIVE 01/22/2020 1029   LEUKOCYTESUR NEGATIVE 01/22/2020 1029   Sepsis Labs Invalid input(s): PROCALCITONIN,  WBC,  LACTICIDVEN Microbiology Recent Results (from the past 240 hour(s))  Respiratory Panel by RT PCR (Flu A&B, Covid) - Nasopharyngeal Swab     Status: None   Collection Time: 01/22/20 10:19 AM   Specimen: Nasopharyngeal Swab  Result Value Ref Range Status   SARS Coronavirus 2 by RT PCR NEGATIVE NEGATIVE Final    Comment: (NOTE) SARS-CoV-2 target nucleic acids are NOT DETECTED.  The SARS-CoV-2 RNA is generally detectable in upper respiratoy specimens during the acute phase of infection. The lowest concentration of SARS-CoV-2 viral copies this assay can detect is 131 copies/mL. A negative result does not preclude SARS-Cov-2 infection and should not  be used as the sole basis for treatment or other patient management decisions. A negative result may occur with  improper specimen collection/handling, submission of specimen other than nasopharyngeal swab, presence of viral mutation(s) within the areas targeted by this assay, and inadequate number of viral copies (<131 copies/mL). A negative result must be combined with clinical observations, patient history, and epidemiological information. The expected result is Negative.  Fact Sheet for Patients:  PinkCheek.be  Fact Sheet for Healthcare Providers:  GravelBags.it  This test is no t yet approved or cleared by the [place] FDA and  has been authorized for detection and/or diagnosis of SARS-CoV-2 by FDA under  an Emergency Use Authorization (EUA). This EUA will remain  in effect (meaning this test can be used) for the duration of the COVID-19 declaration under Section 564(b)(1) of the Act, 21 U.S.C. section 360bbb-3(b)(1), unless the authorization is terminated or revoked sooner.     Influenza A by PCR NEGATIVE NEGATIVE Final   Influenza B by PCR NEGATIVE NEGATIVE Final    Comment: (NOTE) The Xpert Xpress SARS-CoV-2/FLU/RSV assay is intended as an aid in  the diagnosis of influenza from Nasopharyngeal swab specimens and  should not be used as a sole basis for treatment. Nasal washings and  aspirates are unacceptable for Xpert Xpress SARS-CoV-2/FLU/RSV  testing.  Fact Sheet for Patients: PinkCheek.be  Fact Sheet for Healthcare Providers: GravelBags.it  This test is not yet approved or cleared by the [place] FDA and  has been authorized for detection and/or diagnosis of SARS-CoV-2 by  FDA under an Emergency Use Authorization (EUA). This EUA will remain  in effect (meaning this test can be used) for the duration of the  Covid-19 declaration under Section  564(b)(1) of the Act, 21  U.S.C. section 360bbb-3(b)(1), unless the authorization is  terminated or revoked. Performed at [facility], [place] [address] [place], [place] [address]   Urine culture     Status: Abnormal   Collection Time: 01/22/20 10:29 AM   Specimen: Urine, Clean Catch  Result Value Ref Range Status   Specimen Description   Final    URINE, CLEAN CATCH Performed at [facility], [place] [address], [place], [place] [address]    Special Requests   Final    NONE Performed at [facility], [place] [address], [place], [place] [address]    Culture (A)  Final    <10,000 COLONIES/mL INSIGNIFICANT GROWTH Performed at [place] [address] [place], [place] [address]    Report Status 01/23/2020 FINAL  Final  Blood culture (routine single)     Status: None   Collection Time: 01/22/20 10:49 AM   Specimen: BLOOD  Result Value Ref Range Status   Specimen Description   Final    BLOOD BLOOD LEFT FOREARM Performed at [place] [address] [place], [place] [address]    Special Requests   Final    BOTTLES DRAWN AEROBIC AND ANAEROBIC Blood Culture adequate volume Performed at [place] [address] [place], [place] [address]    Culture   Final    NO GROWTH 5 DAYS Performed at [facility], [place] [address] [place], [place] [address]    Report Status 01/27/2020 FINAL  Final  Wound or Superficial Culture     Status: None   Collection Time: 01/22/20  3:22 PM   Specimen: Abscess; Wound  Result Value Ref Range Status   Specimen Description   Final    ABSCESS Performed at [place] [address], [place], [place] [address]    Special Requests   Final    NONE Performed at [facility], [place] [address] [place], [place] [address]    Gram Stain   Final    MODERATE WBC PRESENT, PREDOMINANTLY PMN FEW GRAM POSITIVE  COCCI Performed at [facility], [place] [address], [place],  [address]    Culture RARE METHICILLIN RESISTANT STAPHYLOCOCCUS AUREUS  Final   Report Status 01/24/2020 FINAL  Final   Organism ID, Bacteria METHICILLIN RESISTANT STAPHYLOCOCCUS AUREUS  Final  Susceptibility   Methicillin resistant staphylococcus aureus - MIC*    CIPROFLOXACIN >=8 RESISTANT Resistant     ERYTHROMYCIN >=8 RESISTANT Resistant     GENTAMICIN <=0.5 SENSITIVE Sensitive     OXACILLIN >=4 RESISTANT Resistant     TETRACYCLINE <=1 SENSITIVE Sensitive     VANCOMYCIN 1 SENSITIVE Sensitive     TRIMETH/SULFA <=10 SENSITIVE Sensitive     CLINDAMYCIN <=0.25 SENSITIVE Sensitive     RIFAMPIN <=0.5 SENSITIVE Sensitive     Inducible Clindamycin NEGATIVE Sensitive     * RARE METHICILLIN RESISTANT STAPHYLOCOCCUS AUREUS  Culture, blood (routine x 2)     Status: None (Preliminary result)   Collection Time: 01/25/20 11:33 AM   Specimen: BLOOD LEFT HAND  Result Value Ref Range Status   Specimen Description   Final    BLOOD LEFT HAND Performed at [place] [address], [place], [place] [address]    Special Requests   Final    BOTTLES DRAWN AEROBIC AND ANAEROBIC Blood Culture adequate volume Performed at [place] [address], [place], [place] [address]    Culture   Final    NO GROWTH 2 DAYS Performed at [place] [address] [place], [place] [address]    Report Status PENDING  Incomplete  Culture, blood (routine x 2)     Status: None (Preliminary result)   Collection Time: 01/25/20 11:33 AM   Specimen: BLOOD  Result Value Ref Range Status   Specimen Description   Final    BLOOD LEFT ANTECUBITAL Performed at [facility], [place] [address], [place], [place] [address]    Special Requests   Final    BOTTLES DRAWN AEROBIC AND ANAEROBIC Blood Culture adequate volume Performed at [place] [address],  [place], [place] [address]    Culture   Final    NO GROWTH 2 DAYS Performed at [place] [address], [place], [place] [address]    Report Status PENDING  Incomplete     Time coordinating discharge: 25  minutes  SIGNED: Antonieta Pert, MD  [facility] 01/27/2020, 11:55 AM  If 7PM-7AM, please contact night-coverage [email or web address]

## 2020-01-27 NOTE — TOC Progression Note (Addendum)
Transition of Care [facility]) - Discharge Note    Patient Details  Name: Kelli Jenkins MRN: [id number] Date of Birth: January 31, 1961  Transition of Care [facility]) CM/SW [place], [place] Phone Number: 01/27/2020, 11:58 AM  Clinical Narrative:  Re: Transportation needs.    CSW provided the patient with 2 bus passes to transport to [facility] today and one for to go to her follow up appointment next Thursday. Patient reports she has SNAP benefits to purchase food. A MATCH letter was completed for her discharge medications. Pharmacy notified to deliver her medications to the bedside.  Patient has list of local shelters/ pt. On the waiting list.      Expected Discharge Plan: Homeless Shelter  Barriers to Discharge: Barriers Resolved  Expected Discharge Plan and Services Expected Discharge Plan: Homeless Shelter In-house Referral: Clinical Social Work Discharge Planning Services: Follow-up appt scheduled Post Acute Care Choice: NA   Expected Discharge Date: 01/27/20               DME Arranged: N/A                     Social Determinants of Health (SDOH) Interventions    Readmission Risk Interventions No flowsheet data found.

## 2020-01-27 NOTE — Plan of Care (Signed)
Patient discharged home in stable condition 

## 2020-01-30 LAB — CULTURE, BLOOD (ROUTINE X 2)
Culture: NO GROWTH
Culture: NO GROWTH
Special Requests: ADEQUATE
Special Requests: ADEQUATE

## 2020-02-04 ENCOUNTER — Inpatient Hospital Stay: Payer: Self-pay | Admitting: Family Medicine

## 2020-04-06 ENCOUNTER — Other Ambulatory Visit: Payer: Self-pay

## 2020-04-06 ENCOUNTER — Emergency Department (HOSPITAL_COMMUNITY)
Admission: EM | Admit: 2020-04-06 | Discharge: 2020-04-06 | Disposition: A | Discharge status: Home / Self Care | Payer: Self-pay | Attending: Emergency Medicine | Admitting: Emergency Medicine

## 2020-04-06 ENCOUNTER — Emergency Department (HOSPITAL_COMMUNITY): Payer: Self-pay

## 2020-04-06 ENCOUNTER — Encounter (HOSPITAL_COMMUNITY): Payer: Self-pay

## 2020-04-06 DIAGNOSIS — Z8542 Personal history of malignant neoplasm of other parts of uterus: Secondary | ICD-10-CM | POA: Insufficient documentation

## 2020-04-06 DIAGNOSIS — R059 Cough, unspecified: Secondary | ICD-10-CM | POA: Insufficient documentation

## 2020-04-06 DIAGNOSIS — Z7982 Long term (current) use of aspirin: Secondary | ICD-10-CM | POA: Insufficient documentation

## 2020-04-06 DIAGNOSIS — R509 Fever, unspecified: Secondary | ICD-10-CM | POA: Insufficient documentation

## 2020-04-06 DIAGNOSIS — F17210 Nicotine dependence, cigarettes, uncomplicated: Secondary | ICD-10-CM | POA: Insufficient documentation

## 2020-04-06 DIAGNOSIS — R1084 Generalized abdominal pain: Secondary | ICD-10-CM

## 2020-04-06 DIAGNOSIS — Z20822 Contact with and (suspected) exposure to covid-19: Secondary | ICD-10-CM | POA: Insufficient documentation

## 2020-04-06 DIAGNOSIS — N3001 Acute cystitis with hematuria: Secondary | ICD-10-CM | POA: Insufficient documentation

## 2020-04-06 LAB — CBC
HCT: 44.9 % (ref 36.0–46.0)
Hemoglobin: 14.8 g/dL (ref 12.0–15.0)
MCH: 31.4 pg (ref 26.0–34.0)
MCHC: 33 g/dL (ref 30.0–36.0)
MCV: 95.1 fL (ref 80.0–100.0)
Platelets: 377 10*3/uL (ref 150–400)
RBC: 4.72 MIL/uL (ref 3.87–5.11)
RDW: 13.2 % (ref 11.5–15.5)
WBC: 12.7 10*3/uL — ABNORMAL HIGH (ref 4.0–10.5)
nRBC: 0 % (ref 0.0–0.2)

## 2020-04-06 LAB — RESP PANEL BY RT-PCR (FLU A&B, COVID) ARPGX2
Influenza A by PCR: NEGATIVE
Influenza B by PCR: NEGATIVE
SARS Coronavirus 2 by RT PCR: NEGATIVE

## 2020-04-06 LAB — URINALYSIS, ROUTINE W REFLEX MICROSCOPIC
Bilirubin Urine: NEGATIVE
Glucose, UA: NEGATIVE mg/dL
Hgb urine dipstick: NEGATIVE
Ketones, ur: NEGATIVE mg/dL
Nitrite: NEGATIVE
Protein, ur: 30 mg/dL — AB
Specific Gravity, Urine: >1.046 — ABNORMAL HIGH (ref 1.005–1.030)
pH: 9 — ABNORMAL HIGH (ref 5.0–8.0)

## 2020-04-06 LAB — COMPREHENSIVE METABOLIC PANEL
ALT: 15 U/L (ref 0–44)
AST: 15 U/L (ref 15–41)
Albumin: 3.9 g/dL (ref 3.5–5.0)
Alkaline Phosphatase: 83 U/L (ref 38–126)
Anion gap: 12 (ref 5–15)
BUN: 14 mg/dL (ref 6–20)
CO2: 21 mmol/L — ABNORMAL LOW (ref 22–32)
Calcium: 9.2 mg/dL (ref 8.9–10.3)
Chloride: 105 mmol/L (ref 98–111)
Creatinine, Ser: 0.58 mg/dL (ref 0.44–1.00)
GFR, Estimated: >60 mL/min (ref >60)
Glucose, Bld: 123 mg/dL — ABNORMAL HIGH (ref 70–99)
Potassium: 4.3 mmol/L (ref 3.5–5.1)
Sodium: 138 mmol/L (ref 135–145)
Total Bilirubin: 0.7 mg/dL (ref 0.3–1.2)
Total Protein: 7.5 g/dL (ref 6.5–8.1)

## 2020-04-06 LAB — I-STAT BETA HCG BLOOD, ED (MC, WL, AP ONLY): I-stat hCG, quantitative: <5 m[IU]/mL (ref <5)

## 2020-04-06 LAB — LIPASE, BLOOD: Lipase: 29 U/L (ref 11–51)

## 2020-04-06 MED ORDER — IOHEXOL 300 MG/ML  SOLN
100.0000 mL | Freq: Once | INTRAMUSCULAR | Status: AC | PRN
  Administered 2020-04-06: 18:00:00 100 mL via INTRAVENOUS

## 2020-04-06 MED ORDER — ACETAMINOPHEN 325 MG PO TABS
650.0000 mg | ORAL_TABLET | Freq: Once | ORAL | Status: AC
  Administered 2020-04-06: 17:00:00 650 mg via ORAL
  Filled 2020-04-06: qty 2

## 2020-04-06 MED ORDER — SODIUM CHLORIDE 0.9 % IV BOLUS
1000.0000 mL | Freq: Once | INTRAVENOUS | Status: AC
  Administered 2020-04-06: 19:00:00 1000 mL via INTRAVENOUS

## 2020-04-06 MED ORDER — CEPHALEXIN 500 MG PO CAPS: 500.0000 mg | ORAL_CAPSULE | Freq: Four times a day (QID) | ORAL | 0 refills | Status: DC

## 2020-04-06 NOTE — ED Notes (Signed)
Patient peed in trash can before being discharged. No concerns about AVS. Discharged.

## 2020-04-06 NOTE — ED Triage Notes (Signed)
Pt presents with c/o abdominal pain, vomiting, and fever. Pt reports she did have the J&J vaccine "a while back". Pt also c/o cough and chills. Pt reports symptoms have been presents for approx 5-6 days.

## 2020-04-06 NOTE — ED Provider Notes (Signed)
[facility] DEPT Provider Note   CSN: [id number] Arrival date & time: 04/06/20  1255     History Chief Complaint  Patient presents with  . Abdominal Pain  . Fever    Kelli Jenkins is a 59 y.o. female.  Patient presents to the ER chief complaint of fever, generalized abdominal pain, and cough.  Symptoms been ongoing for 5 or 6 days.  Presents to ER due to lack of symptom resolution.  Denies any vomiting or diarrhea.  Describes abdominal pain as achy.        Past Medical History:  Diagnosis Date  . Cancer ([place])    uterine  . Cocaine addiction ([place])   . Hypokalemia   . Nephrolithiasis   . Pyelonephritis   . SIRS (systemic inflammatory response syndrome) ([facility])   . Tobacco abuse   . UTI (urinary tract infection) 06/2013    Patient Active Problem List   Diagnosis Date Noted  . Cellulitis 01/22/2020  . Severe sepsis ([place]) 01/22/2020  . Homelessness 01/22/2020  . Chest pain 06/08/2016  . Nausea & vomiting 06/08/2016  . Intractable vomiting with nausea   . CAP (community acquired pneumonia) 05/03/2016  . Normocytic anemia 05/03/2016  . Urinary tract infection without hematuria   . Opioid overdose ([place]) 01/21/2016  . Somnolence 01/21/2016  . Cocaine abuse ([place]) 01/21/2016  . Opioid abuse ([place]) 01/21/2016  . UTI (urinary tract infection) 07/13/2013  . HCAP (healthcare-associated pneumonia) 08/16/2011  . Migraine 08/15/2011  . SIRS (systemic inflammatory response syndrome) ([place]) 08/13/2011  . Pyelonephritis 08/12/2011  . Ureteral colic 40/98/1191  . Hypotension 08/12/2011  . Fever 08/12/2011  . Nephrolithiasis 08/12/2011  . Leukocytosis 08/12/2011  . Tobacco use disorder 08/12/2011    Past Surgical History:  Procedure Laterality Date  . ESOPHAGOGASTRODUODENOSCOPY N/A 09/22/2016   Procedure: ESOPHAGOGASTRODUODENOSCOPY (EGD);  Surgeon: Ronald Lobo, MD;  Location: [facility];  Service: Endoscopy;  Laterality: N/A;  . RENAL  ARTERY STENT     stones  . TUBAL LIGATION    . VAGINAL HYSTERECTOMY       OB History   No obstetric history on file.     History reviewed. No pertinent family history.  Social History   Tobacco Use  . Smoking status: Current Every Day Smoker    Packs/day: 1.00    Types: Cigarettes  . Smokeless tobacco: Never Used  Substance Use Topics  . Alcohol use: Yes    Comment: occasionally  . Drug use: Yes    Types: Cocaine    Comment: Patient states she is currently smoking crack but does not know what else is in it     Home Medications Prior to Admission medications   Medication Sig Start Date End Date Taking? Authorizing Provider  aspirin-acetaminophen-caffeine (EXCEDRIN MIGRAINE) [phone] MG tablet Take 3 tablets by mouth every 6 (six) hours as needed for headache.   Yes [provider]  cephALEXin (KEFLEX) 500 MG capsule Take 1 capsule (500 mg total) by mouth 4 (four) times daily. 04/06/20   Luna Fuse, MD    Allergies    Darvocet [propoxyphene n-acetaminophen], Ibuprofen, and Tramadol  Review of Systems   Review of Systems  Constitutional: Positive for fever.  HENT: Negative for ear pain.   Eyes: Negative for pain.  Respiratory: Negative for cough.   Cardiovascular: Negative for chest pain.  Gastrointestinal: Positive for abdominal pain.  Genitourinary: Negative for flank pain.  Musculoskeletal: Negative for back pain.  Skin: Negative for rash.  Neurological:  Negative for headaches.    Physical Exam Updated Vital Signs BP 128/64   Pulse 66   Temp 98.1 F (36.7 C) (Oral)   Resp 20   SpO2 93%   Physical Exam Constitutional:      General: She is not in acute distress.    Appearance: Normal appearance.  HENT:     Head: Normocephalic.     Nose: Nose normal.  Eyes:     Extraocular Movements: Extraocular movements intact.  Cardiovascular:     Rate and Rhythm: Normal rate.  Pulmonary:     Effort: Pulmonary effort is normal.  Abdominal:      Palpations: Abdomen is soft.     Tenderness: There is generalized abdominal tenderness.  Musculoskeletal:        General: Normal range of motion.     Cervical back: Normal range of motion.  Skin:    General: Skin is warm.  Neurological:     General: No focal deficit present.     Mental Status: She is alert.     ED Results / Procedures / Treatments   Labs (all labs ordered are listed, but only abnormal results are displayed) Labs Reviewed  COMPREHENSIVE METABOLIC PANEL - Abnormal; Notable for the following components:      Result Value   CO2 21 (*)    Glucose, Bld 123 (*)    All other components within normal limits  CBC - Abnormal; Notable for the following components:   WBC 12.7 (*)    All other components within normal limits  URINALYSIS, ROUTINE W REFLEX MICROSCOPIC - Abnormal; Notable for the following components:   APPearance CLOUDY (*)    Specific Gravity, Urine >1.046 (*)    pH 9.0 (*)    Protein, ur 30 (*)    Leukocytes,Ua MODERATE (*)    Bacteria, UA MANY (*)    All other components within normal limits  RESP PANEL BY RT-PCR (FLU A&B, COVID) ARPGX2  LIPASE, BLOOD  I-STAT BETA HCG BLOOD, ED ([facility], [facility], [facility] ONLY)    EKG None  Radiology CT Abdomen Pelvis W Contrast  Result Date: 04/06/2020 CLINICAL DATA:  59 year old female with history of abdominal pain, vomiting and fever. EXAM: CT ABDOMEN AND PELVIS WITH CONTRAST TECHNIQUE: Multidetector CT imaging of the abdomen and pelvis was performed using the standard protocol following bolus administration of intravenous contrast. CONTRAST:  158mL OMNIPAQUE IOHEXOL 300 MG/ML  SOLN COMPARISON:  CT the abdomen and pelvis 01/22/2020. FINDINGS: Lower chest: Small amount of bronchial wall thickening and thickening of the peribronchovascular interstitium in the medial aspect of the left lower lobe, which may reflect sequela of mild aspiration or resolving infection. Hepatobiliary: Mild diffuse low attenuation throughout the hepatic  parenchyma, indicative of hepatic steatosis. No suspicious cystic or solid hepatic lesions. No intra or extrahepatic biliary ductal dilatation. Gallbladder is normal in appearance. Pancreas: No pancreatic mass. No pancreatic ductal dilatation. No pancreatic or peripancreatic fluid collections or inflammatory changes. Spleen: Unremarkable. Adrenals/Urinary Tract: Several tiny nonobstructive calculi are noted within the collecting systems of both kidneys measuring up to 4 mm in the interpolar region of the right kidney. Subcentimeter low-attenuation lesions in both kidneys, too small to definitively characterize, but statistically likely to represent tiny cysts. No suspicious renal lesions. No hydroureteronephrosis. Urinary bladder is normal in appearance. Bilateral adrenal glands are normal in appearance. Stomach/Bowel: The appearance of the stomach is normal. No pathologic dilatation of small bowel or colon. Normal appendix. Vascular/Lymphatic: Aortic atherosclerosis, without evidence of aneurysm or  dissection noted in the abdominal or pelvic vasculature. No lymphadenopathy noted in the abdomen or pelvis. Reproductive: Status post hysterectomy. Ovaries are not confidently identified may be surgically absent or atrophic. Other: No significant volume of ascites.  No pneumoperitoneum. Musculoskeletal: There are no aggressive appearing lytic or blastic lesions noted in the visualized portions of the skeleton. IMPRESSION: 1. No acute findings are noted in the abdomen or pelvis to account for the patient's symptoms. 2. Hepatic steatosis. 3. Changes in the left lower lobe which likely reflects sequela of mild aspiration or resolving bronchopneumonia. 4. Nonobstructive calculi in the collecting systems of both kidneys measuring up to 4 mm in the interpolar region of the right kidney. 5. Aortic atherosclerosis. Electronically Signed   By: Vinnie Langton M.D.   On: 04/06/2020 18:07    Procedures Procedures (including  critical care time)  Medications Ordered in ED Medications  acetaminophen (TYLENOL) tablet 650 mg (650 mg Oral Given 04/06/20 1717)  iohexol (OMNIPAQUE) 300 MG/ML solution 100 mL (100 mLs Intravenous Contrast Given 04/06/20 1731)  sodium chloride 0.9 % bolus 1,000 mL (1,000 mLs Intravenous New Bag/Given 04/06/20 1902)    ED Course  I have reviewed the triage vital signs and the nursing notes.  Pertinent labs & imaging results that were available during my care of the patient were reviewed by me and considered in my medical decision making (see chart for details).    MDM Rules/Calculators/A&P                Labs show          Mild white count elevation.  CT abdomen pelvis pursued with no acute findings per radiology.  Patient urinalysis was significantly delayed, however after evaluation the urine appears she does have a urinary tract infection.  Will be given antibiotics to go home with.  Advised follow-up with the primary care doctor in 2 or 3 days.  Advised me to return for persistent fevers worsening symptoms or any additional concerns.   Final Clinical Impression(s) / ED Diagnoses Final diagnoses:  Acute cystitis with hematuria  Generalized abdominal pain    Rx / DC Orders ED Discharge Orders         Ordered    cephALEXin (KEFLEX) 500 MG capsule  4 times daily        04/06/20 2229           Luna Fuse, MD 04/06/20 2229

## 2020-04-06 NOTE — Discharge Instructions (Signed)
Call your primary care doctor or specialist as discussed in the next 2-3 days.   Return immediately back to the ER if:  Your symptoms worsen within the next 12-24 hours. You develop new symptoms such as new fevers, persistent vomiting, new pain, shortness of breath, or new weakness or numbness, or if you have any other concerns.  

## 2020-06-25 ENCOUNTER — Emergency Department (HOSPITAL_BASED_OUTPATIENT_CLINIC_OR_DEPARTMENT_OTHER): Payer: Self-pay

## 2020-06-25 ENCOUNTER — Other Ambulatory Visit: Payer: Self-pay

## 2020-06-25 ENCOUNTER — Encounter (HOSPITAL_BASED_OUTPATIENT_CLINIC_OR_DEPARTMENT_OTHER): Payer: Self-pay | Admitting: Emergency Medicine

## 2020-06-25 ENCOUNTER — Emergency Department (HOSPITAL_BASED_OUTPATIENT_CLINIC_OR_DEPARTMENT_OTHER)
Admission: EM | Admit: 2020-06-25 | Discharge: 2020-06-25 | Disposition: A | Discharge status: Home / Self Care | Payer: Self-pay | Attending: Emergency Medicine | Admitting: Emergency Medicine

## 2020-06-25 DIAGNOSIS — R109 Unspecified abdominal pain: Secondary | ICD-10-CM

## 2020-06-25 DIAGNOSIS — J069 Acute upper respiratory infection, unspecified: Secondary | ICD-10-CM

## 2020-06-25 DIAGNOSIS — F17210 Nicotine dependence, cigarettes, uncomplicated: Secondary | ICD-10-CM | POA: Insufficient documentation

## 2020-06-25 DIAGNOSIS — K922 Gastrointestinal hemorrhage, unspecified: Secondary | ICD-10-CM

## 2020-06-25 DIAGNOSIS — R079 Chest pain, unspecified: Secondary | ICD-10-CM

## 2020-06-25 DIAGNOSIS — Z20822 Contact with and (suspected) exposure to covid-19: Secondary | ICD-10-CM | POA: Insufficient documentation

## 2020-06-25 DIAGNOSIS — M549 Dorsalgia, unspecified: Secondary | ICD-10-CM | POA: Insufficient documentation

## 2020-06-25 DIAGNOSIS — Z8542 Personal history of malignant neoplasm of other parts of uterus: Secondary | ICD-10-CM | POA: Insufficient documentation

## 2020-06-25 LAB — COMPREHENSIVE METABOLIC PANEL
ALT: 18 U/L (ref 0–44)
AST: 17 U/L (ref 15–41)
Albumin: 3.9 g/dL (ref 3.5–5.0)
Alkaline Phosphatase: 70 U/L (ref 38–126)
Anion gap: 10 (ref 5–15)
BUN: 17 mg/dL (ref 8–23)
CO2: 25 mmol/L (ref 22–32)
Calcium: 9 mg/dL (ref 8.9–10.3)
Chloride: 103 mmol/L (ref 98–111)
Creatinine, Ser: 0.91 mg/dL (ref 0.44–1.00)
GFR, Estimated: >60 mL/min (ref >60)
Glucose, Bld: 136 mg/dL — ABNORMAL HIGH (ref 70–99)
Potassium: 3.8 mmol/L (ref 3.5–5.1)
Sodium: 138 mmol/L (ref 135–145)
Total Bilirubin: 0.4 mg/dL (ref 0.3–1.2)
Total Protein: 7.2 g/dL (ref 6.5–8.1)

## 2020-06-25 LAB — CBC WITH DIFFERENTIAL/PLATELET
Abs Immature Granulocytes: 0.04 10*3/uL (ref 0.00–0.07)
Basophils Absolute: 0.1 10*3/uL (ref 0.0–0.1)
Basophils Relative: 1 %
Eosinophils Absolute: 0.2 10*3/uL (ref 0.0–0.5)
Eosinophils Relative: 2 %
HCT: 41 % (ref 36.0–46.0)
Hemoglobin: 14 g/dL (ref 12.0–15.0)
Immature Granulocytes: 0 %
Lymphocytes Relative: 26 %
Lymphs Abs: 3.2 10*3/uL (ref 0.7–4.0)
MCH: 32.3 pg (ref 26.0–34.0)
MCHC: 34.1 g/dL (ref 30.0–36.0)
MCV: 94.5 fL (ref 80.0–100.0)
Monocytes Absolute: 0.8 10*3/uL (ref 0.1–1.0)
Monocytes Relative: 6 %
Neutro Abs: 8.3 10*3/uL — ABNORMAL HIGH (ref 1.7–7.7)
Neutrophils Relative %: 65 %
Platelets: 323 10*3/uL (ref 150–400)
RBC: 4.34 MIL/uL (ref 3.87–5.11)
RDW: 13.4 % (ref 11.5–15.5)
WBC: 12.7 10*3/uL — ABNORMAL HIGH (ref 4.0–10.5)
nRBC: 0 % (ref 0.0–0.2)

## 2020-06-25 LAB — RAPID URINE DRUG SCREEN, HOSP PERFORMED
Amphetamines: POSITIVE — AB
Barbiturates: NOT DETECTED
Benzodiazepines: NOT DETECTED
Cocaine: POSITIVE — AB
Opiates: NOT DETECTED
Tetrahydrocannabinol: POSITIVE — AB

## 2020-06-25 LAB — TROPONIN I (HIGH SENSITIVITY)
Troponin I (High Sensitivity): 5 ng/L (ref <18)
Troponin I (High Sensitivity): 3 ng/L (ref <18)

## 2020-06-25 LAB — URINALYSIS, MICROSCOPIC (REFLEX)

## 2020-06-25 LAB — LIPASE, BLOOD: Lipase: 41 U/L (ref 11–51)

## 2020-06-25 LAB — URINALYSIS, ROUTINE W REFLEX MICROSCOPIC
Bilirubin Urine: NEGATIVE
Glucose, UA: NEGATIVE mg/dL
Ketones, ur: NEGATIVE mg/dL
Leukocytes,Ua: NEGATIVE
Nitrite: NEGATIVE
Protein, ur: NEGATIVE mg/dL
Specific Gravity, Urine: 1.03 (ref 1.005–1.030)
pH: 6 (ref 5.0–8.0)

## 2020-06-25 LAB — SARS CORONAVIRUS 2 (TAT 6-24 HRS): SARS Coronavirus 2: NEGATIVE

## 2020-06-25 MED ORDER — AZITHROMYCIN 250 MG PO TABS: 250.0000 mg | ORAL_TABLET | Freq: Every day | ORAL | 0 refills | Status: DC

## 2020-06-25 MED ORDER — ONDANSETRON HCL 4 MG/2ML IJ SOLN
4.0000 mg | Freq: Once | INTRAMUSCULAR | Status: AC
  Administered 2020-06-25: 4 mg via INTRAVENOUS
  Filled 2020-06-25: qty 2

## 2020-06-25 MED ORDER — FENTANYL CITRATE (PF) 100 MCG/2ML IJ SOLN
50.0000 ug | Freq: Once | INTRAMUSCULAR | Status: AC
  Administered 2020-06-25: 50 ug via INTRAVENOUS
  Filled 2020-06-25: qty 2

## 2020-06-25 MED ORDER — HYDROMORPHONE HCL 1 MG/ML IJ SOLN
0.5000 mg | Freq: Once | INTRAMUSCULAR | Status: AC
Start: 2020-06-25 — End: 2020-06-25
  Administered 2020-06-25: 0.5 mg via INTRAVENOUS
  Filled 2020-06-25: qty 1

## 2020-06-25 NOTE — ED Notes (Signed)
Pt O2 sat dropped into 70's. Pt found to be sleeping and snoring. Pt awakened easily and O2 sat increased to 96 with no further intervention. NAD Noted.

## 2020-06-25 NOTE — ED Notes (Signed)
Pt called out inquiring about receiving abx. Will notify EDP

## 2020-06-25 NOTE — ED Notes (Signed)
ED Provider at bedside. 

## 2020-06-25 NOTE — Discharge Instructions (Signed)
Since you have had a cough for over a month we will try some antibiotics for bronchitis.  Follow-up with your doctor.  Your work-up was otherwise reassuring.  Follow-up with gastroenterology for the blood in the stool.

## 2020-06-25 NOTE — ED Notes (Signed)
COVID SWAB OBTAINED AND TO THE LAB 

## 2020-06-25 NOTE — ED Notes (Signed)
AVS provided to client, informed that Rx has been sent to her pharmacy and importance of taking all abx as prescribed by ED Provider

## 2020-06-25 NOTE — ED Provider Notes (Signed)
[facility] EMERGENC[facility] DEPARTMENT Provider Note   CSN: [id number] Arrival date & time: 06/25/20  9892     History Chief Complaint  Patient presents with  . Chest Pain    Kelli Jenkins is a 60 y.o. female.  HPI Patient presents with chest pain upper abdominal pain and back pain.  States she had nausea and vomiting.  Began a few days ago.  States that the pain is severe.  States she has had decreased oral intake.  Mild shortness of breath.  States she has had some blood in the stool.  States at times he has had blood in the urine.  Pain is dull.  Denies fevers.  Has had some pains like this before.    Past Medical History:  Diagnosis Date  . Cancer ([place])    uterine  . Cocaine addiction ([place])   . Hypokalemia   . Nephrolithiasis   . Pyelonephritis   . SIRS (systemic inflammatory response syndrome) ([facility])   . Tobacco abuse   . UTI (urinary tract infection) 06/2013    Patient Active Problem List   Diagnosis Date Noted  . Cellulitis 01/22/2020  . Severe sepsis ([place]) 01/22/2020  . Homelessness 01/22/2020  . Chest pain 06/08/2016  . Nausea & vomiting 06/08/2016  . Intractable vomiting with nausea   . CAP (community acquired pneumonia) 05/03/2016  . Normocytic anemia 05/03/2016  . Urinary tract infection without hematuria   . Opioid overdose ([place]) 01/21/2016  . Somnolence 01/21/2016  . Cocaine abuse ([place]) 01/21/2016  . Opioid abuse ([place]) 01/21/2016  . UTI (urinary tract infection) 07/13/2013  . HCAP (healthcare-associated pneumonia) 08/16/2011  . Migraine 08/15/2011  . SIRS (systemic inflammatory response syndrome) ([place]) 08/13/2011  . Pyelonephritis 08/12/2011  . Ureteral colic 11/94/1740  . Hypotension 08/12/2011  . Fever 08/12/2011  . Nephrolithiasis 08/12/2011  . Leukocytosis 08/12/2011  . Tobacco use disorder 08/12/2011    Past Surgical History:  Procedure Laterality Date  . ESOPHAGOGASTRODUODENOSCOPY N/A 09/22/2016   Procedure:  ESOPHAGOGASTRODUODENOSCOPY (EGD);  Surgeon: Ronald Lobo, MD;  Location: [facility];  Service: Endoscopy;  Laterality: N/A;  . RENAL ARTERY STENT     stones  . TUBAL LIGATION    . VAGINAL HYSTERECTOMY       OB History   No obstetric history on file.     History reviewed. No pertinent family history.  Social History   Tobacco Use  . Smoking status: Current Every Day Smoker    Packs/day: 1.00    Types: Cigarettes  . Smokeless tobacco: Never Used  Substance Use Topics  . Alcohol use: Yes    Comment: occasionally  . Drug use: Yes    Types: Cocaine    Comment: Patient states she is currently smoking crack but does not know what else is in it     Home Medications Prior to Admission medications   Medication Sig Start Date End Date Taking? Authorizing Provider  azithromycin (ZITHROMAX) 250 MG tablet Take 1 tablet (250 mg total) by mouth daily. Take first 2 tablets together, then 1 every day until finished. 06/25/20  Yes Davonna Belling, MD  aspirin-acetaminophen-caffeine (EXCEDRIN MIGRAINE) [phone] MG tablet Take 3 tablets by mouth every 6 (six) hours as needed for headache.    [provider]  cephALEXin (KEFLEX) 500 MG capsule Take 1 capsule (500 mg total) by mouth 4 (four) times daily. 04/06/20   Luna Fuse, MD    Allergies    Darvocet [propoxyphene n-acetaminophen], Ibuprofen, and Tramadol  Review  of Systems   Review of Systems  Constitutional: Positive for appetite change.  HENT: Negative for congestion.   Cardiovascular: Positive for chest pain.  Gastrointestinal: Positive for abdominal pain, blood in stool, nausea and vomiting.  Genitourinary: Negative for flank pain.  Musculoskeletal: Positive for back pain.  Skin: Negative for rash.  Neurological: Negative for weakness.  Psychiatric/Behavioral: Negative for confusion.    Physical Exam Updated Vital Signs BP 111/66 (BP Location: Right Arm)   Pulse 78   Temp 98.6 F (37 C) (Oral)   Resp  14   Ht 5\' 8"  (1.727 m)   Wt 99.8 kg   SpO2 96%   BMI 33.45 kg/m   Physical Exam Vitals and nursing note reviewed.  HENT:     Head: Normocephalic.  Cardiovascular:     Rate and Rhythm: Normal rate and regular rhythm.  Pulmonary:     Comments: Mildly harsh breath sounds without focal rales or rhonchi. Abdominal:     Comments: Upper abdominal tenderness without rebound or guarding.  No hernias palpated.  Skin:    General: Skin is warm.     Capillary Refill: Capillary refill takes less than 2 seconds.  Neurological:     Mental Status: She is alert and oriented to person, place, and time.     ED Results / Procedures / Treatments   Labs (all labs ordered are listed, but only abnormal results are displayed) Labs Reviewed  COMPREHENSIVE METABOLIC PANEL - Abnormal; Notable for the following components:      Result Value   Glucose, Bld 136 (*)    All other components within normal limits  CBC WITH DIFFERENTIAL/PLATELET - Abnormal; Notable for the following components:   WBC 12.7 (*)    Neutro Abs 8.3 (*)    All other components within normal limits  URINALYSIS, ROUTINE W REFLEX MICROSCOPIC - Abnormal; Notable for the following components:   Hgb urine dipstick TRACE (*)    All other components within normal limits  RAPID URINE DRUG SCREEN, HOSP PERFORMED - Abnormal; Notable for the following components:   Cocaine POSITIVE (*)    Amphetamines POSITIVE (*)    Tetrahydrocannabinol POSITIVE (*)    All other components within normal limits  URINALYSIS, MICROSCOPIC (REFLEX) - Abnormal; Notable for the following components:   Bacteria, UA FEW (*)    All other components within normal limits  SARS CORONAVIRUS 2 (TAT 6-24 HRS)  LIPASE, BLOOD  TROPONIN I (HIGH SENSITIVITY)  TROPONIN I (HIGH SENSITIVITY)    EKG EKG Interpretation  Date/Time:  Saturday June 25 2020 09:40:01 EST Ventricular Rate:  81 PR Interval:    QRS Duration: 80 QT Interval:  384 QTC Calculation: 446 R  Axis:   92 Text Interpretation: Sinus rhythm Consider left atrial enlargement Right axis deviation Confirmed by Davonna Belling [id number]) on 06/25/2020 11:03:34 AM   Radiology DG Chest Portable 1 View  Result Date: 06/25/2020 CLINICAL DATA:  Per triage note; Pt arrives pov with c/o CP, Upper abdominal pain and back pain/ Pt endorses N/V, denies diarrhea. Pt also reports bloody stool EXAM: PORTABLE CHEST - 1 VIEW COMPARISON:  01/22/2020 FINDINGS: Lungs are clear. Heart size and mediastinal contours are within normal limits. No effusion.  No pneumothorax. Visualized bones unremarkable. IMPRESSION: No acute cardiopulmonary disease. Electronically Signed   By: Lucrezia Europe M.D.   On: 06/25/2020 10:51    Procedures Procedures   Medications Ordered in ED Medications  fentaNYL (SUBLIMAZE) injection 50 mcg (50 mcg Intravenous Given 06/25/20 1010)  ondansetron (ZOFRAN) injection 4 mg (4 mg Intravenous Given 06/25/20 1008)  HYDROmorphone (DILAUDID) injection 0.5 mg (0.5 mg Intravenous Given 06/25/20 1108)    ED Course  I have reviewed the triage vital signs and the nursing notes.  Pertinent labs & imaging results that were available during my care of the patient were reviewed by me and considered in my medical decision making (see chart for details).    MDM Rules/Calculators/A&P                          Patient with chest pain abdominal pain.  Reportedly began a few days ago.  States pain severe.  Cardiac work-up reassuring.  Chest x-ray reassuring.  Lab work reassuring.  Reviewing records last for CAT scan she has had have looked good.  Do not think she needs reimaging of her abdomen.  Does have substance abuse history also.  Patient felt better after treatment.  However then still asked about antibiotics.  She then stated that she had actually been coughing for about a month with some green sputum production.  With this new information and the cough even though no pneumonia on x-ray with a month of fifth  Tums patient would benefit from some oral antibiotics.  We will add azithromycin.  Will need outpatient follow-up for the potential blood in the stool and general medical issues.  Discharge home Final Clinical Impression(s) / ED Diagnoses Final diagnoses:  Nonspecific chest pain  Upper respiratory tract infection, unspecified type  Abdominal pain, unspecified abdominal location  Gastrointestinal hemorrhage, unspecified gastrointestinal hemorrhage type    Rx / DC Orders ED Discharge Orders         Ordered    azithromycin (ZITHROMAX) 250 MG tablet  Daily        06/25/20 1316           Davonna Belling, MD 06/25/20 1324

## 2020-06-25 NOTE — ED Notes (Signed)
Pt ambulatory with steady gait to restroom to provide urine specimen 

## 2020-06-25 NOTE — ED Triage Notes (Signed)
Pt arrives pov with c/o CP, Upper abdominal pain and back pain/ Pt endorses N/V, denies diarrhea. Pt also reports bloody stool

## 2020-09-26 ENCOUNTER — Ambulatory Visit (HOSPITAL_COMMUNITY)
Admission: EM | Admit: 2020-09-26 | Discharge: 2020-09-27 | Disposition: A | Discharge status: Home / Self Care | Payer: No Payment, Other | Attending: Nurse Practitioner | Admitting: Nurse Practitioner

## 2020-09-26 ENCOUNTER — Emergency Department (HOSPITAL_COMMUNITY)
Admission: EM | Admit: 2020-09-26 | Discharge: 2020-09-26 | Disposition: A | Discharge status: Other Acute Inpatient Hospital | Payer: Self-pay | Attending: Emergency Medicine | Admitting: Emergency Medicine

## 2020-09-26 ENCOUNTER — Other Ambulatory Visit: Payer: Self-pay

## 2020-09-26 DIAGNOSIS — F1994 Other psychoactive substance use, unspecified with psychoactive substance-induced mood disorder: Secondary | ICD-10-CM | POA: Insufficient documentation

## 2020-09-26 DIAGNOSIS — F1220 Cannabis dependence, uncomplicated: Secondary | ICD-10-CM

## 2020-09-26 DIAGNOSIS — F1590 Other stimulant use, unspecified, uncomplicated: Secondary | ICD-10-CM | POA: Insufficient documentation

## 2020-09-26 DIAGNOSIS — Z20822 Contact with and (suspected) exposure to covid-19: Secondary | ICD-10-CM | POA: Insufficient documentation

## 2020-09-26 DIAGNOSIS — Z7982 Long term (current) use of aspirin: Secondary | ICD-10-CM | POA: Insufficient documentation

## 2020-09-26 DIAGNOSIS — F1420 Cocaine dependence, uncomplicated: Secondary | ICD-10-CM | POA: Insufficient documentation

## 2020-09-26 DIAGNOSIS — F17210 Nicotine dependence, cigarettes, uncomplicated: Secondary | ICD-10-CM | POA: Insufficient documentation

## 2020-09-26 DIAGNOSIS — F1510 Other stimulant abuse, uncomplicated: Secondary | ICD-10-CM

## 2020-09-26 DIAGNOSIS — F1910 Other psychoactive substance abuse, uncomplicated: Secondary | ICD-10-CM | POA: Insufficient documentation

## 2020-09-26 DIAGNOSIS — Z79899 Other long term (current) drug therapy: Secondary | ICD-10-CM | POA: Insufficient documentation

## 2020-09-26 DIAGNOSIS — R45851 Suicidal ideations: Secondary | ICD-10-CM | POA: Insufficient documentation

## 2020-09-26 DIAGNOSIS — F1210 Cannabis abuse, uncomplicated: Secondary | ICD-10-CM | POA: Insufficient documentation

## 2020-09-26 DIAGNOSIS — Z8542 Personal history of malignant neoplasm of other parts of uterus: Secondary | ICD-10-CM | POA: Insufficient documentation

## 2020-09-26 LAB — COMPREHENSIVE METABOLIC PANEL WITH GFR
ALT: 13 U/L (ref 0–44)
AST: 15 U/L (ref 15–41)
Albumin: 3.4 g/dL — ABNORMAL LOW (ref 3.5–5.0)
Alkaline Phosphatase: 59 U/L (ref 38–126)
Anion gap: 5 (ref 5–15)
BUN: 16 mg/dL (ref 8–23)
CO2: 24 mmol/L (ref 22–32)
Calcium: 8.5 mg/dL — ABNORMAL LOW (ref 8.9–10.3)
Chloride: 107 mmol/L (ref 98–111)
Creatinine, Ser: 0.8 mg/dL (ref 0.44–1.00)
GFR, Estimated: >60 mL/min (ref >60)
Glucose, Bld: 117 mg/dL — ABNORMAL HIGH (ref 70–99)
Potassium: 3.7 mmol/L (ref 3.5–5.1)
Sodium: 136 mmol/L (ref 135–145)
Total Bilirubin: 0.6 mg/dL (ref 0.3–1.2)
Total Protein: 6.2 g/dL — ABNORMAL LOW (ref 6.5–8.1)

## 2020-09-26 LAB — RAPID URINE DRUG SCREEN, HOSP PERFORMED
Amphetamines: NOT DETECTED
Barbiturates: NOT DETECTED
Benzodiazepines: NOT DETECTED
Cocaine: POSITIVE — AB
Opiates: NOT DETECTED
Tetrahydrocannabinol: POSITIVE — AB

## 2020-09-26 LAB — CBC WITH DIFFERENTIAL/PLATELET
Abs Immature Granulocytes: 0.03 10*3/uL (ref 0.00–0.07)
Basophils Absolute: 0.1 10*3/uL (ref 0.0–0.1)
Basophils Relative: 1 %
Eosinophils Absolute: 0.1 10*3/uL (ref 0.0–0.5)
Eosinophils Relative: 1 %
HCT: 43.2 % (ref 36.0–46.0)
Hemoglobin: 14.3 g/dL (ref 12.0–15.0)
Immature Granulocytes: 0 %
Lymphocytes Relative: 24 %
Lymphs Abs: 2.6 10*3/uL (ref 0.7–4.0)
MCH: 31.2 pg (ref 26.0–34.0)
MCHC: 33.1 g/dL (ref 30.0–36.0)
MCV: 94.3 fL (ref 80.0–100.0)
Monocytes Absolute: 0.5 10*3/uL (ref 0.1–1.0)
Monocytes Relative: 5 %
Neutro Abs: 7.6 10*3/uL (ref 1.7–7.7)
Neutrophils Relative %: 69 %
Platelets: 247 10*3/uL (ref 150–400)
RBC: 4.58 MIL/uL (ref 3.87–5.11)
RDW: 13.2 % (ref 11.5–15.5)
WBC: 10.8 10*3/uL — ABNORMAL HIGH (ref 4.0–10.5)
nRBC: 0 % (ref 0.0–0.2)

## 2020-09-26 LAB — RESP PANEL BY RT-PCR (FLU A&B, COVID) ARPGX2
Influenza A by PCR: NEGATIVE
Influenza B by PCR: NEGATIVE
SARS Coronavirus 2 by RT PCR: NEGATIVE

## 2020-09-26 LAB — URINALYSIS, ROUTINE W REFLEX MICROSCOPIC
Bilirubin Urine: NEGATIVE
Glucose, UA: NEGATIVE mg/dL
Ketones, ur: NEGATIVE mg/dL
Leukocytes,Ua: NEGATIVE
Nitrite: POSITIVE — AB
Protein, ur: NEGATIVE mg/dL
Specific Gravity, Urine: 1.023 (ref 1.005–1.030)
pH: 5 (ref 5.0–8.0)

## 2020-09-26 LAB — SALICYLATE LEVEL: Salicylate Lvl: <7 mg/dL — ABNORMAL LOW (ref 7.0–30.0)

## 2020-09-26 LAB — ACETAMINOPHEN LEVEL: Acetaminophen (Tylenol), Serum: <10 ug/mL — ABNORMAL LOW (ref 10–30)

## 2020-09-26 LAB — ETHANOL: Alcohol, Ethyl (B): <10 mg/dL (ref <10)

## 2020-09-26 MED ORDER — NICOTINE 21 MG/24HR TD PT24
21.0000 mg | MEDICATED_PATCH | Freq: Every day | TRANSDERMAL | Status: DC
  Administered 2020-09-26: 21 mg via TRANSDERMAL
  Filled 2020-09-26: qty 1

## 2020-09-26 MED ORDER — MAGNESIUM HYDROXIDE 400 MG/5ML PO SUSP: 30.0000 mL | Freq: Every day | ORAL | Status: DC | PRN

## 2020-09-26 MED ORDER — TRAZODONE HCL 50 MG PO TABS
50.0000 mg | ORAL_TABLET | Freq: Every evening | ORAL | Status: DC | PRN
  Administered 2020-09-27: 50 mg via ORAL
  Filled 2020-09-26: qty 1

## 2020-09-26 MED ORDER — HYDROXYZINE HCL 25 MG PO TABS: 25.0000 mg | ORAL_TABLET | Freq: Three times a day (TID) | ORAL | Status: DC | PRN

## 2020-09-26 MED ORDER — ALUM & MAG HYDROXIDE-SIMETH 200-200-20 MG/5ML PO SUSP: 15.0000 mL | Freq: Four times a day (QID) | ORAL | Status: DC | PRN

## 2020-09-26 MED ORDER — ALUM & MAG HYDROXIDE-SIMETH 200-200-20 MG/5ML PO SUSP: 30.0000 mL | ORAL | Status: DC | PRN

## 2020-09-26 MED ORDER — GABAPENTIN 100 MG PO CAPS
100.0000 mg | ORAL_CAPSULE | Freq: Three times a day (TID) | ORAL | Status: DC
  Administered 2020-09-27: 100 mg via ORAL
  Filled 2020-09-26: qty 1

## 2020-09-26 MED ORDER — PANTOPRAZOLE SODIUM 40 MG PO TBEC
40.0000 mg | DELAYED_RELEASE_TABLET | Freq: Every day | ORAL | Status: DC
  Administered 2020-09-27: 40 mg via ORAL
  Filled 2020-09-26: qty 1

## 2020-09-26 NOTE — ED Notes (Signed)
Pt dressed out into burgundy scrubs. Her belongings is in the cabinet located behind the nurses station (23-25) which includes clothing and black purse.
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
## 2020-09-26 NOTE — ED Notes (Signed)
Pt leaving via [facility] to go to [facility].

## 2020-09-26 NOTE — ED Triage Notes (Signed)
09/26/2020  1720  Patient states she has been using drugs and is feeling like she does not want to live anymore.

## 2020-09-26 NOTE — ED Provider Notes (Addendum)
Behavioral Health Admission H&P [facility] & OBS)  Date: 09/27/20 Patient Name: Kelli Jenkins MRN: [id number] Chief Complaint:  Chief Complaint  Patient presents with  . Addiction Problem      Diagnoses:  Final diagnoses:  Substance induced mood disorder ([place])  Cocaine use disorder, severe, dependence (HCC)  Methamphetamine use (HCC)  Cannabis abuse, episodic use    HPI: Kelli Jenkins is a 60 y.o. female who presented to [facility] due to [place]. She was recommended for transfer to [facility] by Margorie John, NP.   On evaluation at [facility], patient is alert and oriented x 4, pleasant, and cooperative. Speech is clear and coherent. Mood is depressed and affect is congruent with mood. Thought process is coherent and thought content is logical. Denies auditory and visual hallucinations. No indication that patient is responding to internal stimuli. No evidence of delusional thought content. Reports passive suicidal ideations without intent or plan.  Denies homicidal ideations. Reports daily use of crack cocaine. Reports occasional use of marijuana and alcohol. Reports last use of crack cocaine and marijuana was yesterday. UDS positive for cocaine and marijuana. BAL<10. Patient reports that her father was murdered when she was a child. She did not witness the murder was on scene shortly after. States that she continues to have flashbacks and nightmares.   Patient reports that she hs previously been treated at [facility] for Bipolar Disorder, depression, and PTSD. States she has not received treatment in years.   Patient is requesting assistance with residential substance abuse treatment.   PHQ 2-9:   [place] ED from 09/26/2020 in [facility] Most recent reading at 09/26/2020 11:18 PM ED from 09/26/2020 in [place] DEPT Most recent reading at 09/26/2020  5:25 PM ED from 06/25/2020 in [place] Most recent reading at 06/25/2020   9:35 AM  C-SSRS RISK CATEGORY Error: Q3, 4, or 5 should not be populated when Q2 is No High Risk No Risk       Total Time spent with patient: 30 minutes  Musculoskeletal  Strength & Muscle Tone: within normal limits Gait & Station: normal Patient leans: N/A  Psychiatric Specialty Exam  Presentation General Appearance: Appropriate for Environment; Casual  Eye Contact:Good  Speech:Clear and Coherent; Normal Rate  Speech Volume:Normal  Handedness:Right   Mood and Affect  Mood:Anxious; Depressed; Hopeless; Worthless  Affect:Appropriate; Depressed   Thought Process  Thought Processes:Coherent; Goal Directed; Linear  Descriptions of Associations:Intact  Orientation:Full (Time, Place and Person)  Thought Content:WDL    Hallucinations:Hallucinations: None  Ideas of Reference:None  Suicidal Thoughts:Suicidal Thoughts: Yes, Passive SI Passive Intent and/or Plan: Without Intent; Without Plan  Homicidal Thoughts:Homicidal Thoughts: No   Sensorium  Memory:Immediate Good; Recent Good; Remote Good  Judgment:Fair  Insight:Fair   Executive Functions  Concentration:Good  Attention Span:Good  Recall:Good  Fund of Knowledge:Good  Language:Good   Psychomotor Activity  Psychomotor Activity:Psychomotor Activity: Normal   Assets  Assets:Communication Skills; Physical Health; Desire for Improvement   Sleep  Sleep:Sleep: Good   Nutritional Assessment (For OBS and FBC admissions only) Has the patient had a weight loss or gain of 10 pounds or more in the last 3 months?: No Has the patient had a decrease in food intake/or appetite?: No Does the patient have dental problems?: No Does the patient have eating habits or behaviors that may be indicators of an eating disorder including binging or inducing vomiting?: No Has the patient recently lost weight without trying?: No Has the patient  been eating poorly because of a decreased appetite?: No Malnutrition  Screening Tool Score: 0    Physical Exam Constitutional:      General: She is not in acute distress.    Appearance: She is not ill-appearing, toxic-appearing or diaphoretic.  HENT:     Head: Normocephalic.     Right Ear: External ear normal.     Left Ear: External ear normal.  Eyes:     Conjunctiva/sclera: Conjunctivae normal.     Pupils: Pupils are equal, round, and reactive to light.  Cardiovascular:     Rate and Rhythm: Normal rate.  Pulmonary:     Effort: Pulmonary effort is normal. No respiratory distress.  Musculoskeletal:        General: Normal range of motion.  Neurological:     Mental Status: She is alert and oriented to person, place, and time.  Psychiatric:        Mood and Affect: Mood is anxious and depressed.        Thought Content: Thought content is not paranoid or delusional. Thought content does not include homicidal ideation.    Review of Systems  Constitutional: Negative for chills, diaphoresis, fever, malaise/fatigue and weight loss.  HENT: Negative for congestion.   Respiratory: Negative for cough and shortness of breath.   Cardiovascular: Negative for chest pain and palpitations.  Gastrointestinal: Negative for diarrhea, nausea and vomiting.  Neurological: Negative for dizziness and seizures.  Psychiatric/Behavioral: Positive for depression, substance abuse and suicidal ideas. Negative for hallucinations and memory loss. The patient is nervous/anxious. The patient does not have insomnia.   All other systems reviewed and are negative.   Blood pressure 100/68, pulse 81, temperature 97.8 F (36.6 C), temperature source Oral, resp. rate 18, SpO2 97 %. There is no height or weight on file to calculate BMI.  Past Psychiatric History: Bipolar Disorder, MDD, PTSD  Is the patient at risk to self? Yes  Has the patient been a risk to self in the past 6 months? No .    Has the patient been a risk to self within the distant past? Yes   Is the patient a risk to  others? No   Has the patient been a risk to others in the past 6 months? No   Has the patient been a risk to others within the distant past? No   Past Medical History:  Past Medical History:  Diagnosis Date  . Cancer ([place])    uterine  . Cocaine addiction ([place])   . Hypokalemia   . Nephrolithiasis   . Pyelonephritis   . SIRS (systemic inflammatory response syndrome) (HCC)   . Tobacco abuse   . UTI (urinary tract infection) 06/2013    Past Surgical History:  Procedure Laterality Date  . ESOPHAGOGASTRODUODENOSCOPY N/A 09/22/2016   Procedure: ESOPHAGOGASTRODUODENOSCOPY (EGD);  Surgeon: Ronald Lobo, MD;  Location: [facility];  Service: Endoscopy;  Laterality: N/A;  . RENAL ARTERY STENT     stones  . TUBAL LIGATION    . VAGINAL HYSTERECTOMY      Family History: No family history on file.  Social History:  Social History   Socioeconomic History  . Marital status: Divorced    Spouse name: Not on file  . Number of children: Not on file  . Years of education: Not on file  . Highest education level: Not on file  Occupational History  . Not on file  Tobacco Use  . Smoking status: Current Every Day Smoker    Packs/day: 1.00  Types: Cigarettes  . Smokeless tobacco: Never Used  Substance and Sexual Activity  . Alcohol use: Yes    Comment: occasionally  . Drug use: Yes    Types: Cocaine    Comment: Patient states she is currently smoking crack but does not know what else is in it   . Sexual activity: Never    Birth control/protection: Surgical  Other Topics Concern  . Not on file  Social History Narrative  . Not on file   Social Determinants of Health   Financial Resource Strain: Not on file  Food Insecurity: Not on file  Transportation Needs: Not on file  Physical Activity: Not on file  Stress: Not on file  Social Connections: Not on file  Intimate Partner Violence: Not on file    SDOH:  SDOH Screenings   Alcohol Screen: Not on file  Depression (MVE7-2):  Not on file  Financial Resource Strain: Not on file  Food Insecurity: Not on file  Housing: Not on file  Physical Activity: Not on file  Social Connections: Not on file  Stress: Not on file  Tobacco Use: High Risk  . Smoking Tobacco Use: Current Every Day Smoker  . Smokeless Tobacco Use: Never Used  Transportation Needs: Not on file    Last Labs:  Admission on 09/26/2020, Discharged on 09/26/2020  Component Date Value Ref Range Status  . SARS Coronavirus 2 by RT PCR 09/26/2020 NEGATIVE  NEGATIVE Final   Comment: (NOTE) SARS-CoV-2 target nucleic acids are NOT DETECTED.  The SARS-CoV-2 RNA is generally detectable in upper respiratory specimens during the acute phase of infection. The lowest concentration of SARS-CoV-2 viral copies this assay can detect is 138 copies/mL. A negative result does not preclude SARS-Cov-2 infection and should not be used as the sole basis for treatment or other patient management decisions. A negative result may occur with  improper specimen collection/handling, submission of specimen other than nasopharyngeal swab, presence of viral mutation(s) within the areas targeted by this assay, and inadequate number of viral copies(<138 copies/mL). A negative result must be combined with clinical observations, patient history, and epidemiological information. The expected result is Negative.  Fact Sheet for Patients:  EntrepreneurPulse.com.au  Fact Sheet for Healthcare Providers:  IncredibleEmployment.be  This test is no                          t yet approved or cleared by the [place] FDA and  has been authorized for detection and/or diagnosis of SARS-CoV-2 by FDA under an Emergency Use Authorization (EUA). This EUA will remain  in effect (meaning this test can be used) for the duration of the COVID-19 declaration under Section 564(b)(1) of the Act, 21 U.S.C.section 360bbb-3(b)(1), unless the authorization is  terminated  or revoked sooner.      . Influenza A by PCR 09/26/2020 NEGATIVE  NEGATIVE Final  . Influenza B by PCR 09/26/2020 NEGATIVE  NEGATIVE Final   Comment: (NOTE) The Xpert Xpress SARS-CoV-2/FLU/RSV plus assay is intended as an aid in the diagnosis of influenza from Nasopharyngeal swab specimens and should not be used as a sole basis for treatment. Nasal washings and aspirates are unacceptable for Xpert Xpress SARS-CoV-2/FLU/RSV testing.  Fact Sheet for Patients: EntrepreneurPulse.com.au  Fact Sheet for Healthcare Providers: IncredibleEmployment.be  This test is not yet approved or cleared by the [place] FDA and has been authorized for detection and/or diagnosis of SARS-CoV-2 by FDA under an Emergency Use Authorization (EUA). This EUA  will remain in effect (meaning this test can be used) for the duration of the COVID-19 declaration under Section 564(b)(1) of the Act, 21 U.S.C. section 360bbb-3(b)(1), unless the authorization is terminated or revoked.  Performed at [facility], [place] [address], [place], [place] [address]   . Sodium 09/26/2020 136  135 - 145 mmol/L Final  . Potassium 09/26/2020 3.7  3.5 - 5.1 mmol/L Final  . Chloride 09/26/2020 107  98 - 111 mmol/L Final  . CO2 09/26/2020 24  22 - 32 mmol/L Final  . Glucose, Bld 09/26/2020 117* 70 - 99 mg/dL Final   Glucose reference range applies only to samples taken after fasting for at least 8 hours.  . BUN 09/26/2020 16  8 - 23 mg/dL Final  . Creatinine, Ser 09/26/2020 0.80  0.44 - 1.00 mg/dL Final  . Calcium 09/26/2020 8.5* 8.9 - 10.3 mg/dL Final  . Total Protein 09/26/2020 6.2* 6.5 - 8.1 g/dL Final  . Albumin 09/26/2020 3.4* 3.5 - 5.0 g/dL Final  . AST 09/26/2020 15  15 - 41 U/L Final  . ALT 09/26/2020 13  0 - 44 U/L Final  . Alkaline Phosphatase 09/26/2020 59  38 - 126 U/L Final  . Total Bilirubin 09/26/2020 0.6  0.3 - 1.2 mg/dL Final  . GFR,  Estimated 09/26/2020 >60  >60 mL/min Final   Comment: (NOTE) Calculated using the CKD-EPI Creatinine Equation (2021)   . Anion gap 09/26/2020 5  5 - 15 Final   Performed at [facility], [place] [address], [place], [place] [address]  . Alcohol, Ethyl (B) 09/26/2020 <10  <10 mg/dL Final   Comment: (NOTE) Lowest detectable limit for serum alcohol is 10 mg/dL.  For medical purposes only. Performed at [facility], [place] [address], [place], [place] [address]   . Opiates 09/26/2020 NONE DETECTED  NONE DETECTED Final  . Cocaine 09/26/2020 POSITIVE* NONE DETECTED Final  . Benzodiazepines 09/26/2020 NONE DETECTED  NONE DETECTED Final  . Amphetamines 09/26/2020 NONE DETECTED  NONE DETECTED Final  . Tetrahydrocannabinol 09/26/2020 POSITIVE* NONE DETECTED Final  . Barbiturates 09/26/2020 NONE DETECTED  NONE DETECTED Final   Comment: (NOTE) DRUG SCREEN FOR MEDICAL PURPOSES ONLY.  IF CONFIRMATION IS NEEDED FOR ANY PURPOSE, NOTIFY LAB WITHIN 5 DAYS.  LOWEST DETECTABLE LIMITS FOR URINE DRUG SCREEN Drug Class                     Cutoff (ng/mL) Amphetamine and metabolites    1000 Barbiturate and metabolites    200 Benzodiazepine                 625 Tricyclics and metabolites     300 Opiates and metabolites        300 Cocaine and metabolites        300 THC                            50 Performed at [facility], [place] [address], [place], [place] [address]   . WBC 09/26/2020 10.8* 4.0 - 10.5 K/uL Final  . RBC 09/26/2020 4.58  3.87 - 5.11 MIL/uL Final  . Hemoglobin 09/26/2020 14.3  12.0 - 15.0 g/dL Final  . HCT 09/26/2020 43.2  36.0 - 46.0 % Final  . MCV 09/26/2020 94.3  80.0 - 100.0 fL Final  . MCH 09/26/2020 31.2  26.0 - 34.0 pg Final  . MCHC 09/26/2020 33.1  30.0 - 36.0 g/dL Final  . RDW 09/26/2020 13.2  11.5 - 15.5 % Final  . Platelets 09/26/2020 247  150 - 400 K/uL Final  . nRBC 09/26/2020 0.0  0.0 - 0.2 % Final  . Neutrophils  Relative % 09/26/2020 69  % Final  . Neutro Abs 09/26/2020 7.6  1.7 - 7.7 K/uL Final  . Lymphocytes Relative 09/26/2020 24  % Final  . Lymphs Abs 09/26/2020 2.6  0.7 - 4.0 K/uL Final  . Monocytes Relative 09/26/2020 5  % Final  . Monocytes Absolute 09/26/2020 0.5  0.1 - 1.0 K/uL Final  . Eosinophils Relative 09/26/2020 1  % Final  . Eosinophils Absolute 09/26/2020 0.1  0.0 - 0.5 K/uL Final  . Basophils Relative 09/26/2020 1  % Final  . Basophils Absolute 09/26/2020 0.1  0.0 - 0.1 K/uL Final  . Immature Granulocytes 09/26/2020 0  % Final  . Abs Immature Granulocytes 09/26/2020 0.03  0.00 - 0.07 K/uL Final   Performed at [facility], [place] [address], [place], [place] [address]  . Salicylate Lvl 49/70/2637 <7.0* 7.0 - 30.0 mg/dL Final   Performed at [place] [address] [place], [place] [address]  . Acetaminophen (Tylenol), Serum 09/26/2020 <10* 10 - 30 ug/mL Final   Comment: (NOTE) Therapeutic concentrations vary significantly. A range of 10-30 ug/mL  may be an effective concentration for many patients. However, some  are best treated at concentrations outside of this range. Acetaminophen concentrations >150 ug/mL at 4 hours after ingestion  and >50 ug/mL at 12 hours after ingestion are often associated with  toxic reactions.  Performed at [facility], [place] [address], [place], [place] [address]   . Color, Urine 09/26/2020 YELLOW  YELLOW Final  . APPearance 09/26/2020 CLEAR  CLEAR Final  . Specific Gravity, Urine 09/26/2020 1.023  1.005 - 1.030 Final  . pH 09/26/2020 5.0  5.0 - 8.0 Final  . Glucose, UA 09/26/2020 NEGATIVE  NEGATIVE mg/dL Final  . Hgb urine dipstick 09/26/2020 SMALL* NEGATIVE Final  . Bilirubin Urine 09/26/2020 NEGATIVE  NEGATIVE Final  . Ketones, ur 09/26/2020 NEGATIVE  NEGATIVE mg/dL Final  . Protein, ur 09/26/2020 NEGATIVE  NEGATIVE mg/dL Final  . Nitrite 09/26/2020 POSITIVE* NEGATIVE Final  .  Leukocytes,Ua 09/26/2020 NEGATIVE  NEGATIVE Final  . RBC / HPF 09/26/2020 0-5  0 - 5 RBC/hpf Final  . WBC, UA 09/26/2020 11-20  0 - 5 WBC/hpf Final  . Bacteria, UA 09/26/2020 MANY* NONE SEEN Final  . Squamous Epithelial / LPF 09/26/2020 0-5  0 - 5 Final  . Mucus 09/26/2020 PRESENT   Final   Performed at [facility], [place] [address] [place],  [address]  Admission on 06/25/2020, Discharged on 06/25/2020  Component Date Value Ref Range Status  . Sodium 06/25/2020 138  135 - 145 mmol/L Final  . Potassium 06/25/2020 3.8  3.5 - 5.1 mmol/L Final  . Chloride 06/25/2020 103  98 - 111 mmol/L Final  . CO2 06/25/2020 25  22 - 32 mmol/L Final  . Glucose, Bld 06/25/2020 136* 70 - 99 mg/dL Final   Glucose reference range applies only to samples taken after fasting for at least 8 hours.  . BUN 06/25/2020 17  8 - 23 mg/dL Final  . Creatinine, Ser 06/25/2020 0.91  0.44 - 1.00 mg/dL Final  . Calcium 06/25/2020 9.0  8.9 - 10.3 mg/dL Final  . Total Protein 06/25/2020 7.2  6.5 - 8.1 g/dL Final  .  Albumin 06/25/2020 3.9  3.5 - 5.0 g/dL Final  . AST 06/25/2020 17  15 - 41 U/L Final  . ALT 06/25/2020 18  0 - 44 U/L Final  . Alkaline Phosphatase 06/25/2020 70  38 - 126 U/L Final  . Total Bilirubin 06/25/2020 0.4  0.3 - 1.2 mg/dL Final  . GFR, Estimated 06/25/2020 >60  >60 mL/min Final   Comment: (NOTE) Calculated using the CKD-EPI Creatinine Equation (2021)   . Anion gap 06/25/2020 10  5 - 15 Final   Performed at [facility], [place] [place], [place] [address]  . Lipase 06/25/2020 41  11 - 51 U/L Final   Performed at [facility], [place] [place], [place] [address]  . WBC 06/25/2020 12.7* 4.0 - 10.5 K/uL Final  . RBC 06/25/2020 4.34  3.87 - 5.11 MIL/uL Final  . Hemoglobin 06/25/2020 14.0  12.0 - 15.0 g/dL Final  . HCT 06/25/2020 41.0  36.0 - 46.0 % Final  . MCV 06/25/2020 94.5  80.0 - 100.0 fL Final  . MCH 06/25/2020 32.3  26.0 - 34.0 pg  Final  . MCHC 06/25/2020 34.1  30.0 - 36.0 g/dL Final  . RDW 06/25/2020 13.4  11.5 - 15.5 % Final  . Platelets 06/25/2020 323  150 - 400 K/uL Final  . nRBC 06/25/2020 0.0  0.0 - 0.2 % Final  . Neutrophils Relative % 06/25/2020 65  % Final  . Neutro Abs 06/25/2020 8.3* 1.7 - 7.7 K/uL Final  . Lymphocytes Relative 06/25/2020 26  % Final  . Lymphs Abs 06/25/2020 3.2  0.7 - 4.0 K/uL Final  . Monocytes Relative 06/25/2020 6  % Final  . Monocytes Absolute 06/25/2020 0.8  0.1 - 1.0 K/uL Final  . Eosinophils Relative 06/25/2020 2  % Final  . Eosinophils Absolute 06/25/2020 0.2  0.0 - 0.5 K/uL Final  . Basophils Relative 06/25/2020 1  % Final  . Basophils Absolute 06/25/2020 0.1  0.0 - 0.1 K/uL Final  . Immature Granulocytes 06/25/2020 0  % Final  . Abs Immature Granulocytes 06/25/2020 0.04  0.00 - 0.07 K/uL Final   Performed at [facility], [place] [place], [place] [address]  . Troponin I (High Sensitivity) 06/25/2020 5  <18 ng/L Final   Comment: (NOTE) Elevated high sensitivity troponin I (hsTnI) values and significant  changes across serial measurements may suggest ACS but many other  chronic and acute conditions are known to elevate hsTnI results.  Refer to the "Links" section for chest pain algorithms and additional  guidance. Performed at [facility], [address] [place], [place] [address]   . Color, Urine 06/25/2020 YELLOW  YELLOW Final  . APPearance 06/25/2020 CLEAR  CLEAR Final  . Specific Gravity, Urine 06/25/2020 1.030  1.005 - 1.030 Final  . pH 06/25/2020 6.0  5.0 - 8.0 Final  . Glucose, UA 06/25/2020 NEGATIVE  NEGATIVE mg/dL Final  . Hgb urine dipstick 06/25/2020 TRACE* NEGATIVE Final  . Bilirubin Urine 06/25/2020 NEGATIVE  NEGATIVE Final  . Ketones, ur 06/25/2020 NEGATIVE  NEGATIVE mg/dL Final  . Protein, ur 06/25/2020 NEGATIVE  NEGATIVE mg/dL Final  . Nitrite 06/25/2020 NEGATIVE  NEGATIVE Final  . Chal[place] Guest 06/25/2020 NEGATIVE   NEGATIVE Final   Performed at [facility], [address] [place], [place] [address]  . Opiates 06/25/2020 NONE DETECTED  NONE DETECTED Final  . Cocaine 06/25/2020 POSITIVE* NONE DETECTED Final  .  Benzodiazepines 06/25/2020 NONE DETECTED  NONE DETECTED Final  . Amphetamines 06/25/2020 POSITIVE* NONE DETECTED Final  . Tetrahydrocannabinol 06/25/2020 POSITIVE* NONE DETECTED Final  . Barbiturates 06/25/2020 NONE DETECTED  NONE DETECTED Final   Comment: (NOTE) DRUG SCREEN FOR MEDICAL PURPOSES ONLY.  IF CONFIRMATION IS NEEDED FOR ANY PURPOSE, NOTIFY LAB WITHIN 5 DAYS.  LOWEST DETECTABLE LIMITS FOR URINE DRUG SCREEN Drug Class                     Cutoff (ng/mL) Amphetamine and metabolites    1000 Barbiturate and metabolites    200 Benzodiazepine                 992 Tricyclics and metabolites     300 Opiates and metabolites        300 Cocaine and metabolites        300 THC                            50 Performed at [facility], [place] [place], [place] [address]   . Troponin I (High Sensitivity) 06/25/2020 3  <18 ng/L Final   Comment: (NOTE) Elevated high sensitivity troponin I (hsTnI) values and significant  changes across serial measurements may suggest ACS but many other  chronic and acute conditions are known to elevate hsTnI results.  Refer to the "Links" section for chest pain algorithms and additional  guidance. Performed at [facility], [address] [place], [place] [address]   . RBC / HPF 06/25/2020 0-5  0 - 5 RBC/hpf Final  . WBC, UA 06/25/2020 6-10  0 - 5 WBC/hpf Final  . Bacteria, UA 06/25/2020 FEW* NONE SEEN Final  . Squamous Epithelial / LPF 06/25/2020 0-5  0 - 5 Final   Performed at [facility], [place] [place], [place] [address]  . SARS Coronavirus 2 06/25/2020 NEGATIVE  NEGATIVE Final   Comment: (NOTE) SARS-CoV-2 target nucleic acids are NOT DETECTED.  The SARS-CoV-2 RNA is generally detectable  in upper and lower respiratory specimens during the acute phase of infection. Negative results do not preclude SARS-CoV-2 infection, do not rule out co-infections with other pathogens, and should not be used as the sole basis for treatment or other patient management decisions. Negative results must be combined with clinical observations, patient history, and epidemiological information. The expected result is Negative.  Fact Sheet for Patients: SugarRoll.be  Fact Sheet for Healthcare Providers: https://www.woods-mathews.com/  This test is not yet approved or cleared by the [place] FDA and  has been authorized for detection and/or diagnosis of SARS-CoV-2 by FDA under an Emergency Use Authorization (EUA). This EUA will remain  in effect (meaning this test can be used) for the duration of the COVID-19 declaration under Se                          ction 564(b)(1) of the Act, 21 U.S.C. section 360bbb-3(b)(1), unless the authorization is terminated or revoked sooner.  Performed at [facility], [place] [address], [place], [place] [address]   Admission on 04/06/2020, Discharged on 04/06/2020  Component Date Value Ref Range Status  . Lipase 04/06/2020 29  11 - 51 U/L Final   Performed at [facility], [place] [address], [place], [place] [address]  . Sodium 04/06/2020 138  135 - 145 mmol/L Final  .  Potassium 04/06/2020 4.3  3.5 - 5.1 mmol/L Final  . Chloride 04/06/2020 105  98 - 111 mmol/L Final  . CO2 04/06/2020 21* 22 - 32 mmol/L Final  . Glucose, Bld 04/06/2020 123* 70 - 99 mg/dL Final   Glucose reference range applies only to samples taken after fasting for at least 8 hours.  . BUN 04/06/2020 14  6 - 20 mg/dL Final  . Creatinine, Ser 04/06/2020 0.58  0.44 - 1.00 mg/dL Final  . Calcium 04/06/2020 9.2  8.9 - 10.3 mg/dL Final  . Total Protein 04/06/2020 7.5  6.5 - 8.1 g/dL Final  . Albumin 04/06/2020 3.9  3.5 - 5.0  g/dL Final  . AST 04/06/2020 15  15 - 41 U/L Final  . ALT 04/06/2020 15  0 - 44 U/L Final  . Alkaline Phosphatase 04/06/2020 83  38 - 126 U/L Final  . Total Bilirubin 04/06/2020 0.7  0.3 - 1.2 mg/dL Final  . GFR, Estimated 04/06/2020 >60  >60 mL/min Final   Comment: (NOTE) Calculated using the CKD-EPI Creatinine Equation (2021)   . Anion gap 04/06/2020 12  5 - 15 Final   Performed at [facility], [place] [address] [place], [place] [address]  . WBC 04/06/2020 12.7* 4.0 - 10.5 K/uL Final  . RBC 04/06/2020 4.72  3.87 - 5.11 MIL/uL Final  . Hemoglobin 04/06/2020 14.8  12.0 - 15.0 g/dL Final  . HCT 04/06/2020 44.9  36.0 - 46.0 % Final  . MCV 04/06/2020 95.1  80.0 - 100.0 fL Final  . MCH 04/06/2020 31.4  26.0 - 34.0 pg Final  . MCHC 04/06/2020 33.0  30.0 - 36.0 g/dL Final  . RDW 04/06/2020 13.2  11.5 - 15.5 % Final  . Platelets 04/06/2020 377  150 - 400 K/uL Final  . nRBC 04/06/2020 0.0  0.0 - 0.2 % Final   Performed at [facility], [place] [address], [place], [place] [address]  . Color, Urine 04/06/2020 YELLOW  YELLOW Final  . APPearance 04/06/2020 CLOUDY* CLEAR Final  . Specific Gravity, Urine 04/06/2020 >1.046* 1.005 - 1.030 Final  . pH 04/06/2020 9.0* 5.0 - 8.0 Final  . Glucose, UA 04/06/2020 NEGATIVE  NEGATIVE mg/dL Final  . Hgb urine dipstick 04/06/2020 NEGATIVE  NEGATIVE Final  . Bilirubin Urine 04/06/2020 NEGATIVE  NEGATIVE Final  . Ketones, ur 04/06/2020 NEGATIVE  NEGATIVE mg/dL Final  . Protein, ur 04/06/2020 30* NEGATIVE mg/dL Final  . Nitrite 04/06/2020 NEGATIVE  NEGATIVE Final  . Leukocytes,Ua 04/06/2020 MODERATE* NEGATIVE Final  . RBC / HPF 04/06/2020 11-20  0 - 5 RBC/hpf Final  . WBC, UA 04/06/2020 21-50  0 - 5 WBC/hpf Final  . Bacteria, UA 04/06/2020 MANY* NONE SEEN Final  . Squamous Epithelial / LPF 04/06/2020 0-5  0 - 5 Final  . Mucus 04/06/2020 PRESENT   Final  . Triple Phosphate Crystal 04/06/2020 PRESENT   Final   Performed at  [facility], [place] [address], [place],  [address]  . I-stat hCG, quantitative 04/06/2020 <5.0  <5 mIU/mL Final  . Comment 3 04/06/2020          Final   Comment:   GEST. AGE      CONC.  (mIU/mL)   <=1 WEEK        5 - 50     2 WEEKS       50 - 500     3 WEEKS       100 - 10,000  4 WEEKS     1,000 - 30,000        FEMALE AND NON-PREGNANT FEMALE:     LESS THAN 5 mIU/mL   . SARS Coronavirus 2 by RT PCR 04/06/2020 NEGATIVE  NEGATIVE Final   Comment: (NOTE) SARS-CoV-2 target nucleic acids are NOT DETECTED.  The SARS-CoV-2 RNA is generally detectable in upper respiratory specimens during the acute phase of infection. The lowest concentration of SARS-CoV-2 viral copies this assay can detect is 138 copies/mL. A negative result does not preclude SARS-Cov-2 infection and should not be used as the sole basis for treatment or other patient management decisions. A negative result may occur with  improper specimen collection/handling, submission of specimen other than nasopharyngeal swab, presence of viral mutation(s) within the areas targeted by this assay, and inadequate number of viral copies(<138 copies/mL). A negative result must be combined with clinical observations, patient history, and epidemiological information. The expected result is Negative.  Fact Sheet for Patients:  EntrepreneurPulse.com.au  Fact Sheet for Healthcare Providers:  IncredibleEmployment.be  This test is no                          t yet approved or cleared by the [place] FDA and  has been authorized for detection and/or diagnosis of SARS-CoV-2 by FDA under an Emergency Use Authorization (EUA). This EUA will remain  in effect (meaning this test can be used) for the duration of the COVID-19 declaration under Section 564(b)(1) of the Act, 21 U.S.C.section 360bbb-3(b)(1), unless the authorization is terminated  or revoked sooner.      . Influenza  A by PCR 04/06/2020 NEGATIVE  NEGATIVE Final  . Influenza B by PCR 04/06/2020 NEGATIVE  NEGATIVE Final   Comment: (NOTE) The Xpert Xpress SARS-CoV-2/FLU/RSV plus assay is intended as an aid in the diagnosis of influenza from Nasopharyngeal swab specimens and should not be used as a sole basis for treatment. Nasal washings and aspirates are unacceptable for Xpert Xpress SARS-CoV-2/FLU/RSV testing.  Fact Sheet for Patients: EntrepreneurPulse.com.au  Fact Sheet for Healthcare Providers: IncredibleEmployment.be  This test is not yet approved or cleared by the [place] FDA and has been authorized for detection and/or diagnosis of SARS-CoV-2 by FDA under an Emergency Use Authorization (EUA). This EUA will remain in effect (meaning this test can be used) for the duration of the COVID-19 declaration under Section 564(b)(1) of the Act, 21 U.S.C. section 360bbb-3(b)(1), unless the authorization is terminated or revoked.  Performed at [facility], [place] [address] [place], [place] [address]     Allergies: Darvocet [propoxyphene n-acetaminophen], Ibuprofen, and Tramadol  PTA Medications: (Not in a hospital admission)   Medical Decision Making  Patient was medically cleared in the ED  Start gabapentin 100 mg TID for substance abuse      Recommendations  Based on my evaluation the patient does not appear to have an emergency medical condition.  Rozetta Nunnery, NP 09/27/20  5:19 AM

## 2020-09-26 NOTE — ED Notes (Signed)
Patient cooperative with admission process.  Denied SI, HI, AVH.  Stated at times she hasn't wanted to live due to her drug problems, but hasn't wanted to kill herself.  Ambulated independently to the unit.  Offered food and fluids.

## 2020-09-26 NOTE — ED Notes (Signed)
Safe transport called 

## 2020-09-26 NOTE — BH Assessment (Signed)
Clinician spoke to Kathlen Mody, RN to call the cart in five minutes as the pt is placed in a private room for her TTS assessment.     Vertell Novak, [place], [facility], [facility] Triage Specialist [phone]

## 2020-09-26 NOTE — ED Provider Notes (Addendum)
[facility] DEPT Provider Note   CSN: [id number] Arrival date & time: 09/26/20  1640     History Chief Complaint  Patient presents with  . Suicidal    Kelli Jenkins is a 60 y.o. female.  Pt presents to the ED today with SI.  She has been using several different drugs (inhaled/snorted; not IV) for years.  She feels like she does not want to live any more.  No specific plan.        Past Medical History:  Diagnosis Date  . Cancer ([place])    uterine  . Cocaine addiction ([place])   . Hypokalemia   . Nephrolithiasis   . Pyelonephritis   . SIRS (systemic inflammatory response syndrome) ([facility])   . Tobacco abuse   . UTI (urinary tract infection) 06/2013    Patient Active Problem List   Diagnosis Date Noted  . Cellulitis 01/22/2020  . Severe sepsis ([place]) 01/22/2020  . Homelessness 01/22/2020  . Chest pain 06/08/2016  . Nausea & vomiting 06/08/2016  . Intractable vomiting with nausea   . CAP (community acquired pneumonia) 05/03/2016  . Normocytic anemia 05/03/2016  . Urinary tract infection without hematuria   . Opioid overdose ([place]) 01/21/2016  . Somnolence 01/21/2016  . Cocaine abuse ([place]) 01/21/2016  . Opioid abuse ([place]) 01/21/2016  . UTI (urinary tract infection) 07/13/2013  . HCAP (healthcare-associated pneumonia) 08/16/2011  . Migraine 08/15/2011  . SIRS (systemic inflammatory response syndrome) ([place]) 08/13/2011  . Pyelonephritis 08/12/2011  . Ureteral colic 67/20/9470  . Hypotension 08/12/2011  . Fever 08/12/2011  . Nephrolithiasis 08/12/2011  . Leukocytosis 08/12/2011  . Tobacco use disorder 08/12/2011    Past Surgical History:  Procedure Laterality Date  . ESOPHAGOGASTRODUODENOSCOPY N/A 09/22/2016   Procedure: ESOPHAGOGASTRODUODENOSCOPY (EGD);  Surgeon: Ronald Lobo, MD;  Location: [facility];  Service: Endoscopy;  Laterality: N/A;  . RENAL ARTERY STENT     stones  . TUBAL LIGATION    . VAGINAL HYSTERECTOMY        OB History   No obstetric history on file.     No family history on file.  Social History   Tobacco Use  . Smoking status: Current Every Day Smoker    Packs/day: 1.00    Types: Cigarettes  . Smokeless tobacco: Never Used  Substance Use Topics  . Alcohol use: Yes    Comment: occasionally  . Drug use: Yes    Types: Cocaine    Comment: Patient states she is currently smoking crack but does not know what else is in it     Home Medications Prior to Admission medications   Medication Sig Start Date End Date Taking? Authorizing Provider  aspirin-acetaminophen-caffeine (EXCEDRIN MIGRAINE) [phone] MG tablet Take 3 tablets by mouth every 6 (six) hours as needed for headache.    [provider]  azithromycin (ZITHROMAX) 250 MG tablet Take 1 tablet (250 mg total) by mouth daily. Take first 2 tablets together, then 1 every day until finished. 06/25/20   Davonna Belling, MD  cephALEXin (KEFLEX) 500 MG capsule Take 1 capsule (500 mg total) by mouth 4 (four) times daily. 04/06/20   Luna Fuse, MD  doxycycline (ADOXA) 100 MG tablet TAKE 1 TABLET BY MOUTH TWO TIMES DAILY FOR 5 DAYS 01/27/20 01/26/21  Antonieta Pert, MD  ondansetron (ZOFRAN-ODT) 4 MG disintegrating tablet DISSOLVE 1 TABLET BY MOUTH EVERY 8 HOURS AS NEEDED. 01/27/20 01/26/21  Antonieta Pert, MD    Allergies    Darvocet [propoxyphene n-acetaminophen], Ibuprofen,  and Tramadol  Review of Systems   Review of Systems  Psychiatric/Behavioral: Positive for suicidal ideas.  All other systems reviewed and are negative.   Physical Exam Updated Vital Signs BP (!) 94/49 (BP Location: Left Arm)   Pulse 94   Temp 98.1 F (36.7 C) (Oral)   Resp 16   Ht 5\' 8"  (1.727 m)   Wt 102.1 kg   SpO2 96%   BMI 34.21 kg/m   Physical Exam Vitals and nursing note reviewed.  Constitutional:      Appearance: Normal appearance.  HENT:     Head: Normocephalic and atraumatic.     Right Ear: External ear normal.     Left Ear:  External ear normal.     Nose: Nose normal.     Mouth/Throat:     Mouth: Mucous membranes are dry.  Eyes:     Extraocular Movements: Extraocular movements intact.     Conjunctiva/sclera: Conjunctivae normal.     Pupils: Pupils are equal, round, and reactive to light.  Cardiovascular:     Rate and Rhythm: Normal rate and regular rhythm.     Pulses: Normal pulses.     Heart sounds: Normal heart sounds.  Pulmonary:     Effort: Pulmonary effort is normal.     Breath sounds: Normal breath sounds.  Abdominal:     General: Abdomen is flat. Bowel sounds are normal.     Palpations: Abdomen is soft.  Musculoskeletal:        General: Normal range of motion.     Cervical back: Normal range of motion and neck supple.  Skin:    General: Skin is warm.     Capillary Refill: Capillary refill takes less than 2 seconds.  Neurological:     General: No focal deficit present.     Mental Status: She is alert and oriented to person, place, and time.  Psychiatric:        Thought Content: Thought content includes suicidal ideation.     ED Results / Procedures / Treatments   Labs (all labs ordered are listed, but only abnormal results are displayed) Labs Reviewed  COMPREHENSIVE METABOLIC PANEL - Abnormal; Notable for the following components:      Result Value   Glucose, Bld 117 (*)    Calcium 8.5 (*)    Total Protein 6.2 (*)    Albumin 3.4 (*)    All other components within normal limits  RAPID URINE DRUG SCREEN, HOSP PERFORMED - Abnormal; Notable for the following components:   Cocaine POSITIVE (*)    Tetrahydrocannabinol POSITIVE (*)    All other components within normal limits  CBC WITH DIFFERENTIAL/PLATELET - Abnormal; Notable for the following components:   WBC 10.8 (*)    All other components within normal limits  SALICYLATE LEVEL - Abnormal; Notable for the following components:   Salicylate Lvl <2.4 (*)    All other components within normal limits  ACETAMINOPHEN LEVEL - Abnormal;  Notable for the following components:   Acetaminophen (Tylenol), Serum <10 (*)    All other components within normal limits  URINALYSIS, ROUTINE W REFLEX MICROSCOPIC - Abnormal; Notable for the following components:   Hgb urine dipstick SMALL (*)    Nitrite POSITIVE (*)    Bacteria, UA MANY (*)    All other components within normal limits  RESP PANEL BY RT-PCR (FLU A&B, COVID) ARPGX2  ETHANOL    EKG None  Radiology No results found.  Procedures Procedures   Medications Ordered in  ED Medications  nicotine (NICODERM CQ - dosed in mg/24 hours) patch 21 mg (has no administration in time range)    ED Course  I have reviewed the triage vital signs and the nursing notes.  Pertinent labs & imaging results that were available during my care of the patient were reviewed by me and considered in my medical decision making (see chart for details).    MDM Rules/Calculators/A&P                          Pt is medically clear for a TTS eval. Disposition per TTS.  Pt has been accepted to [facility].  She is stable for transfer. Final Clinical Impression(s) / ED Diagnoses Final diagnoses:  Suicidal ideation  Polysubstance abuse [facility])    Rx / DC Orders ED Discharge Orders    None       Isla Pence, MD 09/26/20 1918    Isla Pence, MD 09/26/20 2209

## 2020-09-27 LAB — LIPID PANEL
Cholesterol: 196 mg/dL (ref 0–200)
HDL: 26 mg/dL — ABNORMAL LOW (ref >40)
LDL Cholesterol: 116 mg/dL — ABNORMAL HIGH (ref 0–99)
Total CHOL/HDL Ratio: 7.5 RATIO
Triglycerides: 270 mg/dL — ABNORMAL HIGH (ref <150)
VLDL: 54 mg/dL — ABNORMAL HIGH (ref 0–40)

## 2020-09-27 LAB — TSH: TSH: 1.085 u[IU]/mL (ref 0.350–4.500)

## 2020-09-27 MED ORDER — ESCITALOPRAM OXALATE 5 MG PO TABS: 5.0000 mg | ORAL_TABLET | Freq: Every day | ORAL | 0 refills | Status: AC

## 2020-09-27 MED ORDER — ESCITALOPRAM OXALATE 5 MG PO TABS
5.0000 mg | ORAL_TABLET | Freq: Every day | ORAL | Status: DC
  Administered 2020-09-27: 5 mg via ORAL
  Filled 2020-09-27: qty 1

## 2020-09-27 MED ORDER — NICOTINE 21 MG/24HR TD PT24
21.0000 mg | MEDICATED_PATCH | Freq: Once | TRANSDERMAL | Status: DC
  Administered 2020-09-27: 21 mg via TRANSDERMAL
  Filled 2020-09-27: qty 1

## 2020-09-27 MED ORDER — GABAPENTIN 100 MG PO CAPS
100.0000 mg | ORAL_CAPSULE | Freq: Three times a day (TID) | ORAL | 0 refills | Status: AC
Start: 2020-09-27 — End: ?

## 2020-09-27 NOTE — Progress Notes (Addendum)
Charlene from [facility] called and reported that referral has been provided to provider and provider is currently reviewing for bed.    Tekoa Hamor, LCSW, [place] Social Worker  [facility]

## 2020-09-27 NOTE — ED Notes (Signed)
Patient sleeping.  RR even and unlabored.  Continue to monitor for safety.

## 2020-09-27 NOTE — Progress Notes (Signed)
CSW received call back from nurse and she reported that patient has too many comorbidities and will not be able to have a bed at [facility] [place].   CSW notified provider and added additional detox and SU resources in the AVS.    [place], LCSW, [place] Worker  [facility]

## 2020-09-27 NOTE — Discharge Instructions (Addendum)
Follow up with [facility] tomorrow.  Patient is instructed to take all prescribed medications as recommended. Report any side effects or adverse reactions to your outpatient psychiatrist. Patient is instructed to abstain from alcohol and illegal drugs while on prescription medications. In the event of worsening symptoms, patient is instructed to call the crisis hotline, 911, or go to the nearest emergency department for evaluation and treatment.

## 2020-09-27 NOTE — Progress Notes (Signed)
Per provider, Dr. Kai Levins, patient requesting detox and rehab services.  Patient reports use of crack cocaine daily and marijuana.  Patient consented to go to [facility] and for this CSW to send information to facility for a bed.    CSW called Charlene from [facility] and she let this CSW know that they have 4 beds available and 5 beds available at [facility].  Charlene requested for this CSW to send over patient information to ensure patient appropriateness for [facility].    CSW faxed over referral information.  CSW will monitor for updates for a bed at [facility].    Tevis Dunavan, LCSW, [place] Social Worker  [facility]

## 2020-09-27 NOTE — ED Notes (Signed)
Pt is sitting in bed eating a ham sandwich with chips drinking apple juice.

## 2020-09-27 NOTE — ED Notes (Signed)
Patient asked for some medication to help her sleep. This nurse advised patient she has a PRN order for trazodone that can be given. Pt agreed to take trazodone.

## 2020-09-27 NOTE — Discharge Summary (Signed)
Kelli Jenkins to be D/C'd home per MD order. Discussed with the patient and all questions fully answered. An After Visit Summary was printed and given to the patient. Prescriptions were also given to patient. Patient escorted out and D/C home via safe transport. [name]  09/27/2020 4:13 PM

## 2020-09-27 NOTE — Progress Notes (Signed)
[facility] nurse, Amy, requested to speak with patient for pre-screener prior to admission.  Contact for Amy is [phone].    CSW assisted patient with calling nurse, Amy, to participate in screener.     Verlie Hellenbrand, LCSW, [place] Social Worker  [facility]

## 2020-09-27 NOTE — Progress Notes (Signed)
Pt is alert and oriented with flat affect. Pt complained of stomach discomfort. Pt refused PRN  when offered. Administered scheduled meds with no incident. Pt denies pain, SI/HI/AVH at this time. Staff will monitor for pt's safety.

## 2020-09-27 NOTE — Progress Notes (Signed)
CSW called [facility] for an update with placement.  [facility] reports that they have not been able to look them over yet.    CSW will continue to monitor for patient placment.    Tauriel Scronce, LCSW, [place] Social Worker  [facility]

## 2020-09-27 NOTE — ED Provider Notes (Addendum)
FBC/OBS ASAP Discharge Summary  Date and Time: 09/27/2020 9:57 AM  Name: Kelli Jenkins  MRN:  [id number]   Discharge Diagnoses:  Final diagnoses:  Substance induced mood disorder ([place])  Cocaine use disorder, severe, dependence ([place])  Methamphetamine use ([place])  Cannabis abuse, episodic use    Subjective: Patient reports that she did not sleep well last night. She reports she was only able to eat a little bit of dinner last night. She reports just feeling all over kind of bad from withdrawal. She reports she needs to do this though. She reports she cannot keep going like this and needs to get better for herself. She reports she knows she needs an inpatient program and wants to do one. She reports that she has been depressed for a while. Discussed starting an SSRI, she reports she had been on one in the past but not for long and does not remember the name. Discussed the need to give the medicine a full 4-6 weeks for affect and she reported understanding. She reports no SI, HI, or AVH. She has no other concerns at present.  Stay Summary: Patient presented to [facility] due to [place] on 6/6, she was recommended for transfer to [facility] for monitoring. She did not have a good nights sleep as she reported withdrawal symptoms. She was started on Lexapro 5 mg daily for depression. On 6/7 she reported no SI and that she wanted to go to Rehab and was discharged.  Total Time spent with patient: 20 minutes  Past Psychiatric History: Bipolar Disorder, MDD, PTSD Past Medical History:  Past Medical History:  Diagnosis Date  . Cancer ([place])    uterine  . Cocaine addiction ([place])   . Hypokalemia   . Nephrolithiasis   . Pyelonephritis   . SIRS (systemic inflammatory response syndrome) (HCC)   . Tobacco abuse   . UTI (urinary tract infection) 06/2013    Past Surgical History:  Procedure Laterality Date  . ESOPHAGOGASTRODUODENOSCOPY N/A 09/22/2016   Procedure: ESOPHAGOGASTRODUODENOSCOPY (EGD);  Surgeon: Ronald Lobo, MD;  Location: [facility];  Service: Endoscopy;  Laterality: N/A;  . RENAL ARTERY STENT     stones  . TUBAL LIGATION    . VAGINAL HYSTERECTOMY     Family History: No family history on file. Family Psychiatric History: None on file Social History:  Social History   Substance and Sexual Activity  Alcohol Use Yes   Comment: occasionally     Social History   Substance and Sexual Activity  Drug Use Yes  . Types: Cocaine   Comment: Patient states she is currently smoking crack but does not know what else is in it     Social History   Socioeconomic History  . Marital status: Divorced    Spouse name: Not on file  . Number of children: Not on file  . Years of education: Not on file  . Highest education level: Not on file  Occupational History  . Not on file  Tobacco Use  . Smoking status: Current Every Day Smoker    Packs/day: 1.00    Types: Cigarettes  . Smokeless tobacco: Never Used  Substance and Sexual Activity  . Alcohol use: Yes    Comment: occasionally  . Drug use: Yes    Types: Cocaine    Comment: Patient states she is currently smoking crack but does not know what else is in it   . Sexual activity: Never    Birth control/protection: Surgical  Other Topics Concern  .  Not on file  Social History Narrative  . Not on file   Social Determinants of Health   Financial Resource Strain: Not on file  Food Insecurity: Not on file  Transportation Needs: Not on file  Physical Activity: Not on file  Stress: Not on file  Social Connections: Not on file   SDOH:  SDOH Screenings   Alcohol Screen: Not on file  Depression (WUJ8-1): Not on file  Financial Resource Strain: Not on file  Food Insecurity: Not on file  Housing: Not on file  Physical Activity: Not on file  Social Connections: Not on file  Stress: Not on file  Tobacco Use: High Risk  . Smoking Tobacco Use: Current Every Day Smoker  . Smokeless Tobacco Use: Never Used  Transportation Needs: Not  on file    Has this patient used any form of tobacco in the last 30 days? (Cigarettes, Smokeless Tobacco, Cigars, and/or Pipes) Cessation prescription was prescribed  Current Medications:  Current Facility-Administered Medications  Medication Dose Route Frequency Provider Last Rate Last Admin  . alum & mag hydroxide-simeth (MAALOX/MYLANTA) 200-200-20 MG/5ML suspension 30 mL  30 mL Oral Q4H PRN Lindon Romp A, NP      . escitalopram (LEXAPRO) tablet 5 mg  5 mg Oral Daily Jaramiah Bossard, Redgie Grayer, MD      . gabapentin (NEURONTIN) capsule 100 mg  100 mg Oral TID Lindon Romp A, NP   100 mg at 09/27/20 0915  . hydrOXYzine (ATARAX/VISTARIL) tablet 25 mg  25 mg Oral TID PRN Lindon Romp A, NP      . magnesium hydroxide (MILK OF MAGNESIA) suspension 30 mL  30 mL Oral Daily PRN Lindon Romp A, NP      . nicotine (NICODERM CQ - dosed in mg/24 hours) patch 21 mg  21 mg Transdermal Once Briant Cedar, MD   21 mg at 09/27/20 0915  . pantoprazole (PROTONIX) EC tablet 40 mg  40 mg Oral Daily Lindon Romp A, NP   40 mg at 09/27/20 0915  . traZODone (DESYREL) tablet 50 mg  50 mg Oral QHS PRN Lindon Romp A, NP   50 mg at 09/27/20 1914   Current Outpatient Medications  Medication Sig Dispense Refill  . omeprazole (PRILOSEC) 20 MG capsule Take 20 mg by mouth daily. Ran out    . aspirin-acetaminophen-caffeine (EXCEDRIN MIGRAINE) 250-250-65 MG tablet Take 3 tablets by mouth every 6 (six) hours as needed for headache.    . ondansetron (ZOFRAN-ODT) 4 MG disintegrating tablet DISSOLVE 1 TABLET BY MOUTH EVERY 8 HOURS AS NEEDED. (Patient taking differently: Ran out) 20 tablet 0    PTA Medications: (Not in a hospital admission)   Musculoskeletal  Strength & Muscle Tone: within normal limits Gait & Station: normal Patient leans: N/A  Psychiatric Specialty Exam  Presentation  General Appearance: Appropriate for Environment; Casual  Eye Contact:Good  Speech:Clear and Coherent; Normal Rate  Speech  Volume:Normal  Handedness:Right   Mood and Affect  Mood:Depressed  Affect:Depressed   Thought Process  Thought Processes:Coherent; Goal Directed  Descriptions of Associations:Intact  Orientation:Full (Time, Place and Person)  Thought Content:Logical     Hallucinations:Hallucinations: None  Ideas of Reference:None  Suicidal Thoughts:Suicidal Thoughts: No SI Passive Intent and/or Plan: Without Intent; Without Plan  Homicidal Thoughts:Homicidal Thoughts: No   Sensorium  Memory:Immediate Good; Recent Good; Remote Good  Judgment:Fair  Insight:Fair   Executive Functions  Concentration:Good  Attention Span:Good  [place]  Language:Good   Psychomotor Activity  Psychomotor  Activity:Psychomotor Activity: Normal   Assets  Assets:Communication Skills; Desire for Improvement; Resilience   Sleep  Sleep:Sleep: Poor   Nutritional Assessment (For OBS and FBC admissions only) Has the patient had a weight loss or gain of 10 pounds or more in the last 3 months?: No Has the patient had a decrease in food intake/or appetite?: No Does the patient have dental problems?: No Does the patient have eating habits or behaviors that may be indicators of an eating disorder including binging or inducing vomiting?: No Has the patient recently lost weight without trying?: No Has the patient been eating poorly because of a decreased appetite?: No Malnutrition Screening Tool Score: 0    Physical Exam  Physical Exam Vitals and nursing note reviewed.  Constitutional:      General: She is not in acute distress.    Appearance: Normal appearance. She is not ill-appearing or toxic-appearing.  HENT:     Head: Normocephalic and atraumatic.  Cardiovascular:     Rate and Rhythm: Normal rate.  Pulmonary:     Effort: Pulmonary effort is normal.  Musculoskeletal:        General: Normal range of motion.  Neurological:     Mental Status: She is alert.     Review of Systems  Constitutional: Positive for malaise/fatigue. Negative for chills and fever.  Respiratory: Negative for cough and shortness of breath.   Cardiovascular: Negative for chest pain.  Gastrointestinal: Positive for abdominal pain (mild, vague) and nausea. Negative for constipation, diarrhea and vomiting.  Neurological: Positive for headaches (mild). Negative for weakness.  Psychiatric/Behavioral: Positive for depression and substance abuse. Negative for hallucinations and suicidal ideas.   Blood pressure (!) 101/51, pulse 79, temperature 97.7 F (36.5 C), temperature source Oral, resp. rate 16, SpO2 97 %. There is no height or weight on file to calculate BMI.  Demographic Factors:  Living alone  Loss Factors: Decline in physical health  Historical Factors: Impulsivity  Risk Reduction Factors:   Positive therapeutic relationship  Continued Clinical Symptoms:  Alcohol/Substance Abuse/Dependencies  Cognitive Features That Contribute To Risk:  None    Suicide Risk:  Minimal: No identifiable suicidal ideation.  Patients presenting with no risk factors but with morbid ruminations; may be classified as minimal risk based on the severity of the depressive symptoms  Plan Of Care/Follow-up recommendations:  - Activity as tolerated. - Diet as recommended by PCP. - Keep all scheduled follow-up appointments as recommended.  Disposition: Discharge to inpatient Rehab  Briant Cedar, MD 09/27/2020, 9:57 AM

## 2020-09-28 LAB — HEMOGLOBIN A1C
Hgb A1c MFr Bld: 5.9 % — ABNORMAL HIGH (ref 4.8–5.6)
Mean Plasma Glucose: 123 mg/dL

## 2020-10-22 ENCOUNTER — Emergency Department (HOSPITAL_COMMUNITY): Admission: EM | Admit: 2020-10-22 | Discharge: 2020-10-22 | Discharge status: Against Medical Advice | Payer: Self-pay

## 2020-10-22 NOTE — ED Triage Notes (Signed)
Pt left without being seen.

## 2020-10-22 NOTE — ED Triage Notes (Signed)
The pt arrived by [facility]  c/oabd pain for one week reported by ems   she had used cocaine prior to arrival here the pt had to wait to ne triaged full in triage  others needed triaging thjat came in before her. I called her in to be triaged she asked how long was the wait when she was told 38 pts waiting she retorted that she was going to the ed on 68.  She made a call  and asked to be rolled outside

## 2020-10-30 ENCOUNTER — Other Ambulatory Visit: Payer: Self-pay

## 2020-10-30 ENCOUNTER — Emergency Department (HOSPITAL_BASED_OUTPATIENT_CLINIC_OR_DEPARTMENT_OTHER)
Admission: EM | Admit: 2020-10-30 | Discharge: 2020-10-31 | Disposition: A | Discharge status: Home / Self Care | Payer: Self-pay | Attending: Emergency Medicine | Admitting: Emergency Medicine

## 2020-10-30 DIAGNOSIS — S161XXA Strain of muscle, fascia and tendon at neck level, initial encounter: Secondary | ICD-10-CM

## 2020-10-30 DIAGNOSIS — S060X0A Concussion without loss of consciousness, initial encounter: Secondary | ICD-10-CM | POA: Insufficient documentation

## 2020-10-30 DIAGNOSIS — W108XXA Fall (on) (from) other stairs and steps, initial encounter: Secondary | ICD-10-CM | POA: Insufficient documentation

## 2020-10-30 DIAGNOSIS — S0990XA Unspecified injury of head, initial encounter: Secondary | ICD-10-CM

## 2020-10-30 DIAGNOSIS — W19XXXA Unspecified fall, initial encounter: Secondary | ICD-10-CM

## 2020-10-30 DIAGNOSIS — S0101XA Laceration without foreign body of scalp, initial encounter: Secondary | ICD-10-CM

## 2020-10-30 DIAGNOSIS — Z8542 Personal history of malignant neoplasm of other parts of uterus: Secondary | ICD-10-CM | POA: Insufficient documentation

## 2020-10-30 DIAGNOSIS — S01311A Laceration without foreign body of right ear, initial encounter: Secondary | ICD-10-CM | POA: Insufficient documentation

## 2020-10-30 DIAGNOSIS — F17210 Nicotine dependence, cigarettes, uncomplicated: Secondary | ICD-10-CM | POA: Insufficient documentation

## 2020-10-30 NOTE — ED Triage Notes (Signed)
Reports a mechanical fall over a rug and down ~4 stairs  hitting her head ~ 24 hrs ago. States she is unsure of LOC but did have "loss of vision for a minute". Lac behind R ear with dried blood. Pt states her tshirt was "soaked with blood" afterward. She does endorse 2 episodes of vomiting, BM "didn't look right ? Blood in it" , and a HA. Pt to room in wheelchair but is able to walk.

## 2020-10-31 ENCOUNTER — Emergency Department (HOSPITAL_BASED_OUTPATIENT_CLINIC_OR_DEPARTMENT_OTHER): Payer: Self-pay

## 2020-10-31 LAB — CBC WITH DIFFERENTIAL/PLATELET
Abs Immature Granulocytes: 0.05 10*3/uL (ref 0.00–0.07)
Basophils Absolute: 0.1 10*3/uL (ref 0.0–0.1)
Basophils Relative: 1 %
Eosinophils Absolute: 0.4 10*3/uL (ref 0.0–0.5)
Eosinophils Relative: 3 %
HCT: 37.6 % (ref 36.0–46.0)
Hemoglobin: 12.9 g/dL (ref 12.0–15.0)
Immature Granulocytes: 0 %
Lymphocytes Relative: 20 %
Lymphs Abs: 2.5 10*3/uL (ref 0.7–4.0)
MCH: 32.7 pg (ref 26.0–34.0)
MCHC: 34.3 g/dL (ref 30.0–36.0)
MCV: 95.2 fL (ref 80.0–100.0)
Monocytes Absolute: 0.8 10*3/uL (ref 0.1–1.0)
Monocytes Relative: 6 %
Neutro Abs: 8.7 10*3/uL — ABNORMAL HIGH (ref 1.7–7.7)
Neutrophils Relative %: 70 %
Platelets: 285 10*3/uL (ref 150–400)
RBC: 3.95 MIL/uL (ref 3.87–5.11)
RDW: 13.4 % (ref 11.5–15.5)
WBC: 12.5 10*3/uL — ABNORMAL HIGH (ref 4.0–10.5)
nRBC: 0 % (ref 0.0–0.2)

## 2020-10-31 LAB — BASIC METABOLIC PANEL
Anion gap: 7 (ref 5–15)
BUN: 15 mg/dL (ref 8–23)
CO2: 23 mmol/L (ref 22–32)
Calcium: 8.8 mg/dL — ABNORMAL LOW (ref 8.9–10.3)
Chloride: 105 mmol/L (ref 98–111)
Creatinine, Ser: 0.79 mg/dL (ref 0.44–1.00)
GFR, Estimated: >60 mL/min (ref >60)
Glucose, Bld: 132 mg/dL — ABNORMAL HIGH (ref 70–99)
Potassium: 3.9 mmol/L (ref 3.5–5.1)
Sodium: 135 mmol/L (ref 135–145)

## 2020-10-31 NOTE — Discharge Instructions (Addendum)
Take Tylenol 1000 mg rotated with ibuprofen 600 mg every 4 hours as needed for pain.  Rest.  Follow-up with your primary doctor if symptoms are not improving in the next week.

## 2020-10-31 NOTE — ED Provider Notes (Addendum)
[facility] Provider Note   CSN: [id number] Arrival date & time: 10/30/20  2325     History Chief Complaint  Patient presents with   Head Injury    Kelli Jenkins is a 60 y.o. female.  Patient is a 60 year old female with past medical history of anemia, kidney stones, prior UTIs.  Patient presenting today for evaluation of fall/head injury.  She tells me she was walking outside down wet stairs when her shoe caught on one of the stairs and caused her to fall forward.  She landed on the ground at the end of the stairs, then hit her head on the bottom of a table.  She reports "seeing spots" followed by brief loss of vision.  She had some bleeding from behind her right ear.  This happened yesterday evening.  Since then, she has been experiencing pain to the right side of her head, pressure behind her right eye.  She is also reportedly vomited twice.  She reports some discomfort in her neck, but no weakness, numbness, or tingling of the extremities.  The history is provided by the patient.      Past Medical History:  Diagnosis Date   Cancer ([place])    uterine   Cocaine addiction ([place])    Hypokalemia    Nephrolithiasis    Pyelonephritis    SIRS (systemic inflammatory response syndrome) ([place])    Tobacco abuse    UTI (urinary tract infection) 06/2013    Patient Active Problem List   Diagnosis Date Noted   Cellulitis 01/22/2020   Severe sepsis ([place]) 01/22/2020   Homelessness 01/22/2020   Chest pain 06/08/2016   Nausea & vomiting 06/08/2016   Intractable vomiting with nausea    CAP (community acquired pneumonia) 05/03/2016   Normocytic anemia 05/03/2016   Urinary tract infection without hematuria    Opioid overdose ([place]) 01/21/2016   Somnolence 01/21/2016   Cocaine abuse ([place]) 01/21/2016   Opioid abuse ([place]) 01/21/2016   UTI (urinary tract infection) 07/13/2013   HCAP (healthcare-associated pneumonia) 08/16/2011   Migraine 08/15/2011   SIRS  (systemic inflammatory response syndrome) ([place]) 08/13/2011   Pyelonephritis 12/45/8099   Ureteral colic 83/38/2505   Hypotension 08/12/2011   Fever 08/12/2011   Nephrolithiasis 08/12/2011   Leukocytosis 08/12/2011   Tobacco use disorder 08/12/2011    Past Surgical History:  Procedure Laterality Date   ESOPHAGOGASTRODUODENOSCOPY N/A 09/22/2016   Procedure: ESOPHAGOGASTRODUODENOSCOPY (EGD);  Surgeon: Ronald Lobo, MD;  Location: [facility];  Service: Endoscopy;  Laterality: N/A;   RENAL ARTERY STENT     stones   TUBAL LIGATION     VAGINAL HYSTERECTOMY       OB History   No obstetric history on file.     No family history on file.  Social History   Tobacco Use   Smoking status: Every Day    Packs/day: 1.00    Pack years: 0.00    Types: Cigarettes   Smokeless tobacco: Never  Substance Use Topics   Alcohol use: Yes    Comment: occasionally   Drug use: Yes    Types: Cocaine    Comment: Patient states she is currently smoking crack but does not know what else is in it     Home Medications Prior to Admission medications   Medication Sig Start Date End Date Taking? Authorizing Provider  gabapentin (NEURONTIN) 100 MG capsule Take 1 capsule (100 mg total) by mouth 3 (three) times daily. 09/27/20  Yes Pashayan, Redgie Grayer, MD  escitalopram (LEXAPRO) 5 MG tablet Take 1 tablet (5 mg total) by mouth daily. 09/28/20 10/28/20  Briant Cedar, MD  omeprazole (PRILOSEC) 20 MG capsule Take 20 mg by mouth daily. Ran out    [provider]    Allergies    Darvocet [propoxyphene n-acetaminophen], Ibuprofen, and Tramadol  Review of Systems   Review of Systems  All other systems reviewed and are negative.  Physical Exam Updated Vital Signs BP 116/72 (BP Location: Right Arm)   Pulse 86   Temp 98 F (36.7 C) (Oral)   Resp 19   Ht 5\' 8"  (1.727 m)   Wt 102.1 kg   SpO2 96%   BMI 34.21 kg/m   Physical Exam Vitals and nursing note reviewed.  Constitutional:       General: She is not in acute distress.    Appearance: She is well-developed. She is not diaphoretic.  HENT:     Head: Normocephalic.     Comments: There is a 1.5 cm laceration behind the right ear.  Wound edges are well approximated and there is no bleeding. Eyes:     Extraocular Movements: Extraocular movements intact.     Pupils: Pupils are equal, round, and reactive to light.  Neck:     Comments: There is mild tenderness in the soft tissues of the cervical region.  There is no bony tenderness or step-off. Cardiovascular:     Rate and Rhythm: Normal rate and regular rhythm.     Heart sounds: No murmur heard.   No friction rub. No gallop.  Pulmonary:     Effort: Pulmonary effort is normal. No respiratory distress.     Breath sounds: Normal breath sounds. No wheezing.  Abdominal:     General: Bowel sounds are normal. There is no distension.     Palpations: Abdomen is soft.     Tenderness: There is no abdominal tenderness.  Musculoskeletal:        General: Normal range of motion.     Cervical back: Normal range of motion and neck supple. Tenderness present.  Skin:    General: Skin is warm and dry.  Neurological:     General: No focal deficit present.     Mental Status: She is alert and oriented to person, place, and time.     Cranial Nerves: No cranial nerve deficit.     Motor: No weakness.     Coordination: Coordination normal.    ED Results / Procedures / Treatments   Labs (all labs ordered are listed, but only abnormal results are displayed) Labs Reviewed  BASIC METABOLIC PANEL  CBC WITH DIFFERENTIAL/PLATELET    EKG None  Radiology No results found.  Procedures Procedures   Medications Ordered in ED Medications - No data to display  ED Course  I have reviewed the triage vital signs and the nursing notes.  Pertinent labs & imaging results that were available during my care of the patient were reviewed by me and considered in my medical decision making (see  chart for details).    MDM Rules/Calculators/A&P  CT scan of the head and cervical spine are unremarkable.  Laboratory studies also unremarkable.  Patient is neurologically intact with stable vital signs.  It appears as though she has experienced a concussion.  She is advised to take Tylenol/Motrin as needed and rest.  To follow-up as needed if symptoms are not improving in the next week.  No sutures are indicated.  Laceration is over 24 hours old and is  well approximated and appears to be healing.  Final Clinical Impression(s) / ED Diagnoses Final diagnoses:  None    Rx / DC Orders ED Discharge Orders     None        Veryl Speak, MD 10/31/20 2035    Veryl Speak, MD 10/31/20 [phone]

## 2020-12-31 ENCOUNTER — Other Ambulatory Visit: Payer: Self-pay

## 2020-12-31 ENCOUNTER — Emergency Department (HOSPITAL_BASED_OUTPATIENT_CLINIC_OR_DEPARTMENT_OTHER)
Admission: EM | Admit: 2020-12-31 | Discharge: 2020-12-31 | Disposition: A | Discharge status: Home / Self Care | Payer: Self-pay | Attending: Emergency Medicine | Admitting: Emergency Medicine

## 2020-12-31 ENCOUNTER — Encounter (HOSPITAL_BASED_OUTPATIENT_CLINIC_OR_DEPARTMENT_OTHER): Payer: Self-pay | Admitting: Emergency Medicine

## 2020-12-31 DIAGNOSIS — K029 Dental caries, unspecified: Secondary | ICD-10-CM | POA: Insufficient documentation

## 2020-12-31 DIAGNOSIS — F17210 Nicotine dependence, cigarettes, uncomplicated: Secondary | ICD-10-CM | POA: Insufficient documentation

## 2020-12-31 DIAGNOSIS — Z8542 Personal history of malignant neoplasm of other parts of uterus: Secondary | ICD-10-CM | POA: Insufficient documentation

## 2020-12-31 MED ORDER — CLINDAMYCIN HCL 300 MG PO CAPS: 300.0000 mg | ORAL_CAPSULE | Freq: Three times a day (TID) | ORAL | 0 refills | Status: AC

## 2020-12-31 NOTE — ED Triage Notes (Signed)
Left lower dental pain x 1 week, no swelling noted.

## 2020-12-31 NOTE — ED Provider Notes (Signed)
[facility] Provider Note   CSN: [id number] Arrival date & time: 12/31/20  0715     History Chief Complaint  Patient presents with   Dental Pain    Left lower     Kelli Jenkins is a 60 y.o. female.  The history is provided by the patient.  Dental Pain Location:  Lower Severity:  Mild Onset quality:  Gradual Timing:  Intermittent Progression:  Waxing and waning Chronicity:  New Context: poor dentition   Relieved by:  Nothing Worsened by:  Nothing Associated symptoms: no congestion, no difficulty swallowing, no drooling, no facial pain, no facial swelling, no fever, no gum swelling, no headaches, no neck pain, no neck swelling, no oral bleeding, no oral lesions and no trismus       Past Medical History:  Diagnosis Date   Cancer ([place])    uterine   Cocaine addiction ([place])    Hypokalemia    Nephrolithiasis    Pyelonephritis    SIRS (systemic inflammatory response syndrome) ([place])    Tobacco abuse    UTI (urinary tract infection) 06/2013    Patient Active Problem List   Diagnosis Date Noted   Cellulitis 01/22/2020   Severe sepsis ([place]) 01/22/2020   Homelessness 01/22/2020   Chest pain 06/08/2016   Nausea & vomiting 06/08/2016   Intractable vomiting with nausea    CAP (community acquired pneumonia) 05/03/2016   Normocytic anemia 05/03/2016   Urinary tract infection without hematuria    Opioid overdose ([place]) 01/21/2016   Somnolence 01/21/2016   Cocaine abuse ([place]) 01/21/2016   Opioid abuse ([place]) 01/21/2016   UTI (urinary tract infection) 07/13/2013   HCAP (healthcare-associated pneumonia) 08/16/2011   Migraine 08/15/2011   SIRS (systemic inflammatory response syndrome) ([place]) 08/13/2011   Pyelonephritis 99991111   Ureteral colic 99991111   Hypotension 08/12/2011   Fever 08/12/2011   Nephrolithiasis 08/12/2011   Leukocytosis 08/12/2011   Tobacco use disorder 08/12/2011    Past Surgical History:  Procedure  Laterality Date   ESOPHAGOGASTRODUODENOSCOPY N/A 09/22/2016   Procedure: ESOPHAGOGASTRODUODENOSCOPY (EGD);  Surgeon: Ronald Lobo, MD;  Location: [facility];  Service: Endoscopy;  Laterality: N/A;   RENAL ARTERY STENT     stones   TUBAL LIGATION     VAGINAL HYSTERECTOMY       OB History   No obstetric history on file.     No family history on file.  Social History   Tobacco Use   Smoking status: Every Day    Packs/day: 1.00    Types: Cigarettes   Smokeless tobacco: Never  Substance Use Topics   Alcohol use: Yes    Comment: occasionally   Drug use: Yes    Types: Cocaine    Comment: Patient states she is currently smoking crack but does not know what else is in it     Home Medications Prior to Admission medications   Medication Sig Start Date End Date Taking? Authorizing Provider  clindamycin (CLEOCIN) 300 MG capsule Take 1 capsule (300 mg total) by mouth 3 (three) times daily for 10 days. 12/31/20 01/10/21 Yes Yarisbel Miranda, DO  escitalopram (LEXAPRO) 5 MG tablet Take 1 tablet (5 mg total) by mouth daily. 09/28/20 10/28/20  Briant Cedar, MD  gabapentin (NEURONTIN) 100 MG capsule Take 1 capsule (100 mg total) by mouth 3 (three) times daily. 09/27/20   Briant Cedar, MD  omeprazole (PRILOSEC) 20 MG capsule Take 20 mg by mouth daily. Ran out    [provider]    Allergies    Darvocet [propoxyphene n-acetaminophen], Ibuprofen, and Tramadol  Review of Systems   Review of Systems  Constitutional:  Negative for fever.  HENT:  Negative for congestion, drooling, facial swelling, mouth sores, trouble swallowing and voice change.   Musculoskeletal:  Negative for neck pain.  Neurological:  Negative for headaches.   Physical Exam  ED Triage Vitals [12/31/20 0727]  Enc Vitals Group     BP (!) 150/82     Pulse Rate 85     Resp 16     Temp 98.3 F (36.8 C)     Temp Source Oral     SpO2 97 %     Weight      Height      Head Circumference      Peak  Flow      Pain Score      Pain Loc      Pain Edu?      Excl. in [place]?      Physical Exam Constitutional:      Appearance: She is not ill-appearing.  HENT:     Head: Normocephalic and atraumatic.     Comments: Multiple dental caries on exam, no obvious abscess, no submandibular swelling, no lip swelling or tongue swelling, no trismus, no drooling    Nose: Nose normal.     Mouth/Throat:     Mouth: Mucous membranes are moist.     Pharynx: No oropharyngeal exudate or posterior oropharyngeal erythema.  Eyes:     Extraocular Movements: Extraocular movements intact.     Pupils: Pupils are equal, round, and reactive to light.  Musculoskeletal:     Cervical back: Normal range of motion and neck supple. No tenderness.  Neurological:     Mental Status: She is alert.    ED Results / Procedures / Treatments   Labs (all labs ordered are listed, but only abnormal results are displayed) Labs Reviewed - No data to display  EKG None  Radiology No results found.  Procedures Procedures   Medications Ordered in ED Medications - No data to display  ED Course  I have reviewed the triage vital signs and the nursing notes.  Pertinent labs & imaging results that were available during my care of the patient were reviewed by me and considered in my medical decision making (see chart for details).    MDM Rules/Calculators/A&P                           Kelli Jenkins is here with left lower dental pain.  Overall no significant medical history.  Normal vitals.  No fever.  Multiple dental caries on exam.  No signs of abscess.  No trismus, no submandibular swelling.  Overall looks to be chronic dental caries.  Will prescribe antibiotics and give her referral to dentistry.  Discharged in good condition.  Understands return precautions.  This chart was dictated using voice recognition software.  Despite best efforts to proofread,  errors can occur which can change the documentation meaning.    Final Clinical Impression(s) / ED Diagnoses Final diagnoses:  Pain due to dental caries    Rx / DC Orders ED Discharge Orders          Ordered    clindamycin (CLEOCIN) 300 MG capsule  3 times daily        12/31/20 0734             Corbett Moulder,  [place], [place] 12/31/20 [phone]

## 2021-04-24 ENCOUNTER — Emergency Department (HOSPITAL_BASED_OUTPATIENT_CLINIC_OR_DEPARTMENT_OTHER)
Admission: EM | Admit: 2021-04-24 | Discharge: 2021-04-24 | Disposition: A | Discharge status: Home / Self Care | Payer: Self-pay | Attending: Emergency Medicine | Admitting: Emergency Medicine

## 2021-04-24 ENCOUNTER — Other Ambulatory Visit: Payer: Self-pay

## 2021-04-24 ENCOUNTER — Encounter (HOSPITAL_BASED_OUTPATIENT_CLINIC_OR_DEPARTMENT_OTHER): Payer: Self-pay | Admitting: Urology

## 2021-04-24 DIAGNOSIS — K0889 Other specified disorders of teeth and supporting structures: Secondary | ICD-10-CM | POA: Insufficient documentation

## 2021-04-24 DIAGNOSIS — R519 Headache, unspecified: Secondary | ICD-10-CM | POA: Insufficient documentation

## 2021-04-24 MED ORDER — PENICILLIN V POTASSIUM 250 MG PO TABS
500.0000 mg | ORAL_TABLET | Freq: Once | ORAL | Status: AC
  Administered 2021-04-24: 500 mg via ORAL
  Filled 2021-04-24: qty 2

## 2021-04-24 MED ORDER — PENICILLIN V POTASSIUM 500 MG PO TABS: 500.0000 mg | ORAL_TABLET | Freq: Four times a day (QID) | ORAL | 0 refills | Status: AC

## 2021-04-24 MED ORDER — KETOROLAC TROMETHAMINE 30 MG/ML IJ SOLN
30.0000 mg | Freq: Once | INTRAMUSCULAR | Status: AC
  Administered 2021-04-24: 30 mg via INTRAMUSCULAR
  Filled 2021-04-24: qty 1

## 2021-04-24 NOTE — ED Provider Notes (Signed)
[facility] EMERGENCY DEPARTMENT Provider Note   CSN: [id number] Arrival date & time: 04/24/21  0101     History  Chief Complaint  Patient presents with   Dental Pain    Kelli Jenkins is a 61 y.o. female.  Patient with diffuse "mouth pain" ongoing for several days that is worsening.  She has multiple missing teeth and multiple teeth broken off at the gumline.  States she lost 1 frontal incisor last week.  Has pain to the left upper jaw as well as left lower jaw where she has a broken tooth.  This tooth has been broken for several months.  States she cannot afford to see a dentist and lost the referral information she was given last time she was here.  She is taking Excedrin without relief.  No difficulty breathing or difficulty swallowing.  No chest pain or shortness of breath.  No fever or vomiting. Admits to frequent cocaine use last used 2 days ago.  Denies any IV drug abuse. Has a chronic headache which is unchanged and not acutely worse today.  The history is provided by the patient.  Dental Pain Associated symptoms: headaches   Associated symptoms: no fever       Home Medications Prior to Admission medications   Medication Sig Start Date End Date Taking? Authorizing Provider  escitalopram (LEXAPRO) 5 MG tablet Take 1 tablet (5 mg total) by mouth daily. 09/28/20 10/28/20  Briant Cedar, MD  gabapentin (NEURONTIN) 100 MG capsule Take 1 capsule (100 mg total) by mouth 3 (three) times daily. 09/27/20   Briant Cedar, MD  omeprazole (PRILOSEC) 20 MG capsule Take 20 mg by mouth daily. Ran out    [provider]      Allergies    Darvocet [propoxyphene n-acetaminophen], Ibuprofen, and Tramadol    Review of Systems   Review of Systems  Constitutional:  Negative for activity change, appetite change, fatigue and fever.  HENT:  Positive for dental problem.   Respiratory:  Negative for cough and shortness of breath.   Cardiovascular:  Negative  for chest pain.  Gastrointestinal:  Negative for abdominal pain, nausea and vomiting.  Genitourinary:  Negative for dysuria and hematuria.  Musculoskeletal:  Negative for arthralgias and myalgias.  Skin:  Negative for rash.  Neurological:  Positive for headaches. Negative for weakness.   all other systems are negative except as noted in the HPI and PMH.   Physical Exam Updated Vital Signs BP 128/76 (BP Location: Right Arm)    Pulse 67    Temp (!) 97.5 F (36.4 C) (Oral)    Resp 18    Ht 5\' 8"  (1.727 m)    Wt 102.1 kg    SpO2 99%    BMI 34.22 kg/m  Physical Exam Vitals and nursing note reviewed.  Constitutional:      General: She is not in acute distress.    Appearance: She is well-developed.  HENT:     Head: Normocephalic and atraumatic.     Mouth/Throat:     Pharynx: No oropharyngeal exudate.     Comments: Multiple missing teeth including left upper incisor, right upper incisor, left lower molar broken off at the gumline.  Floor mouth is soft.  No drainable abscess.  No fluctuance.  Controlling secretions. Eyes:     Conjunctiva/sclera: Conjunctivae normal.     Pupils: Pupils are equal, round, and reactive to light.  Neck:     Comments: No meningismus. Cardiovascular:  Rate and Rhythm: Normal rate and regular rhythm.     Heart sounds: Normal heart sounds. No murmur heard. Pulmonary:     Effort: Pulmonary effort is normal. No respiratory distress.     Breath sounds: Normal breath sounds.  Abdominal:     Palpations: Abdomen is soft.     Tenderness: There is no abdominal tenderness. There is no guarding or rebound.  Musculoskeletal:        General: No tenderness. Normal range of motion.     Cervical back: Normal range of motion and neck supple.  Skin:    General: Skin is warm.  Neurological:     Mental Status: She is alert and oriented to person, place, and time.     Cranial Nerves: No cranial nerve deficit.     Motor: No abnormal muscle tone.     Coordination:  Coordination normal.     Comments:  5/5 strength throughout. CN 2-12 intact.Equal grip strength.   Psychiatric:        Behavior: Behavior normal.    ED Results / Procedures / Treatments   Labs (all labs ordered are listed, but only abnormal results are displayed) Labs Reviewed - No data to display  EKG None  Radiology No results found.  Procedures Procedures    Medications Ordered in ED Medications  penicillin v potassium (VEETID) tablet 500 mg (has no administration in time range)  ketorolac (TORADOL) 30 MG/ML injection 30 mg (has no administration in time range)    ED Course/ Medical Decision Making/ A&P                           Medical Decision Making  Multiple pain due to poor dentition and multiple caries multiple missing and fractured teeth.  No evidence of abscess or Ludwig's angina.  Patient agreeable to Toradol injection.  States she can tolerate this despite her listed ibuprofen and tramadol allergy.  Treat with antibiotics, nonnarcotic pain medication, dental followup.  Return to the ED with difficulty breathing, difficulty swallowing, or any other concerns.  Recommend cocaine cessation.        Final Clinical Impression(s) / ED Diagnoses Final diagnoses:  Pain, dental    Rx / DC Orders ED Discharge Orders     None         Jabier Deese, Annie Main, MD 04/24/21 [phone]

## 2021-04-24 NOTE — Discharge Instructions (Signed)
Take the antibiotics as prescribed and follow-up with a dentist from the attached list.  Stop using cocaine.  You may use Tylenol or naproxen as needed for pain.  Return to the ED with difficulty breathing, difficulty swallowing, any other concerns

## 2021-04-24 NOTE — ED Triage Notes (Signed)
Upper dental pain that started last night Very poor dentition H/o Cocaine use, last use 2 days ago

## 2021-07-11 IMAGING — CT CT CERVICAL SPINE W/O CM
3 of 4 series · 12 of 33 positions shown, 14 images · non-contrast
Comparison: None.

CLINICAL DATA: 60-year-old female with trauma.

EXAM:
CT HEAD WITHOUT CONTRAST
CT CERVICAL SPINE WITHOUT CONTRAST
TECHNIQUE: Multidetector CT imaging of the head and cervical spine was
performed following the standard protocol without intravenous
contrast. Multiplanar CT image reconstructions of the cervical spine
were also generated.

[Series 3: c_spine 2.0 i30s 3 · axial · 0.38mm/px · z∈[-90,+34]mm · 4 of 94 slices shown, 5 images]
[im 16/94  soft-tissue]
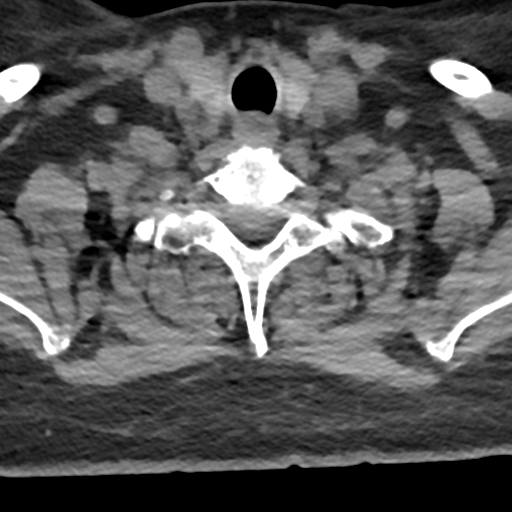
[im 16/94  bone]
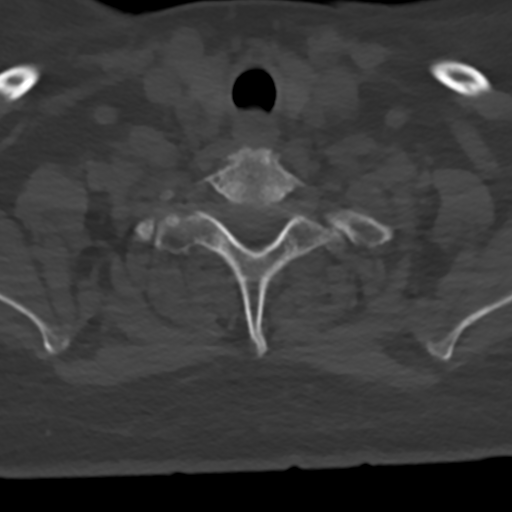
[im 32/94  bone]
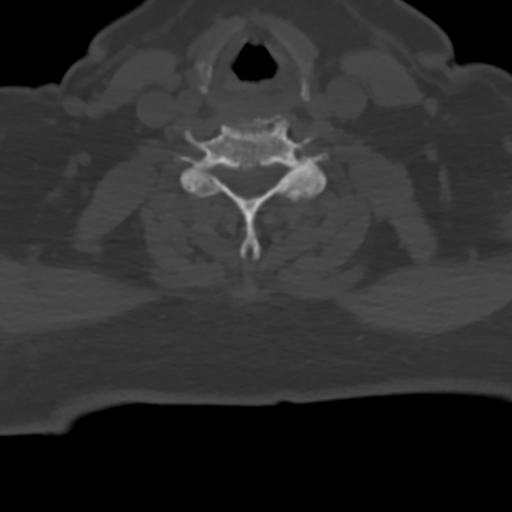
[im 63/94  bone]
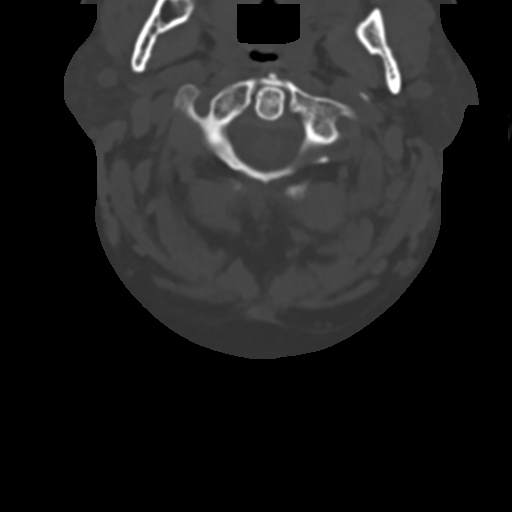
[im 78/94  bone]
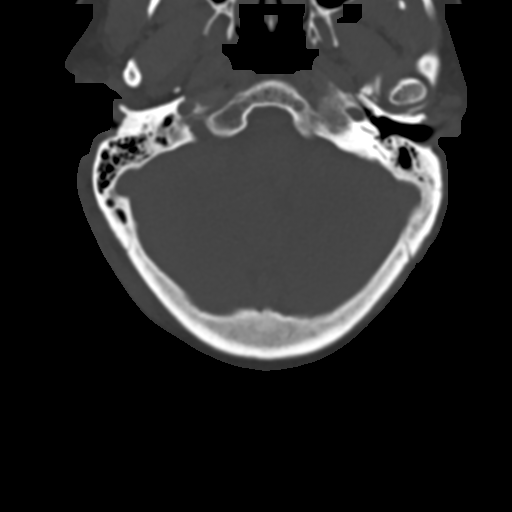

[Series 5: coronals · coronal · 0.28mm/px · 3 of 61 slices shown]
[im 13/61  bone]
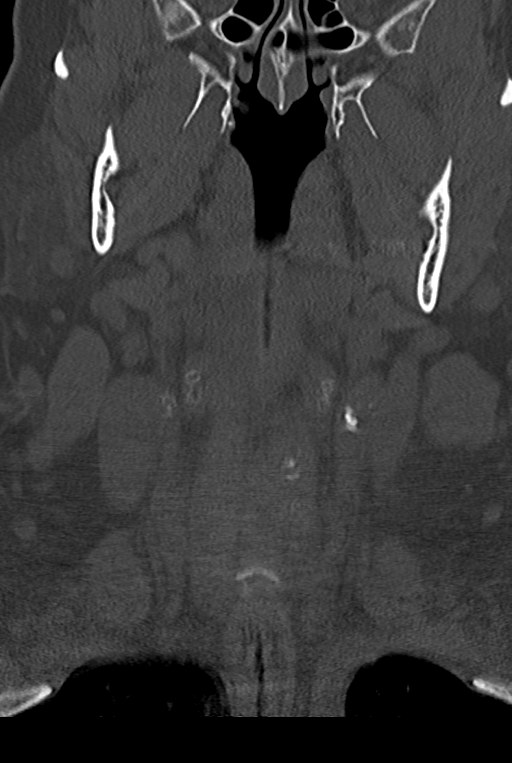
[im 25/61  bone]
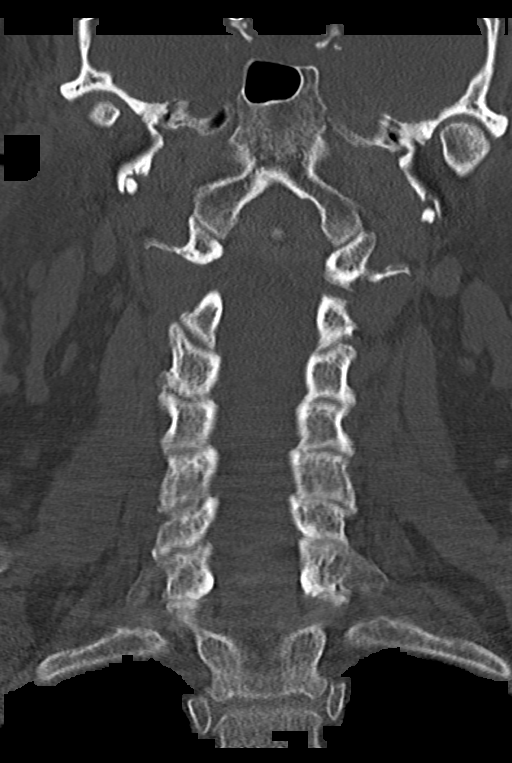
[im 37/61  bone]
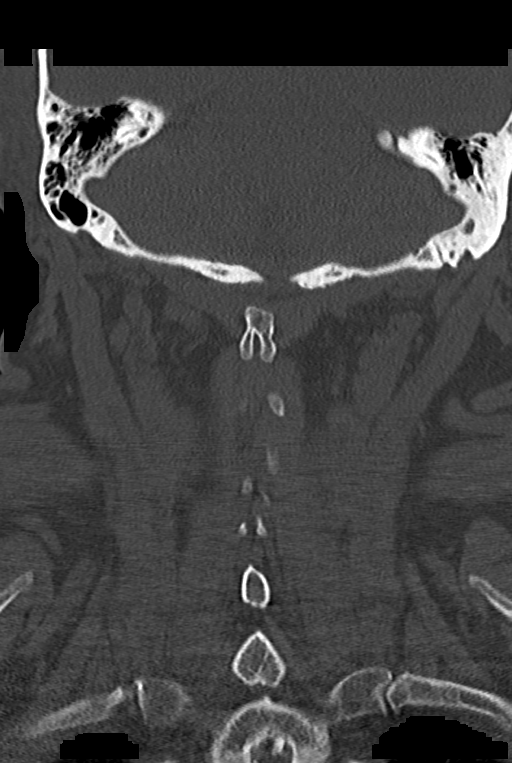

[Series 6: sagittals · sagittal · 0.28mm/px · 5 of 61 slices shown, 6 images]
[im 21/61  bone]
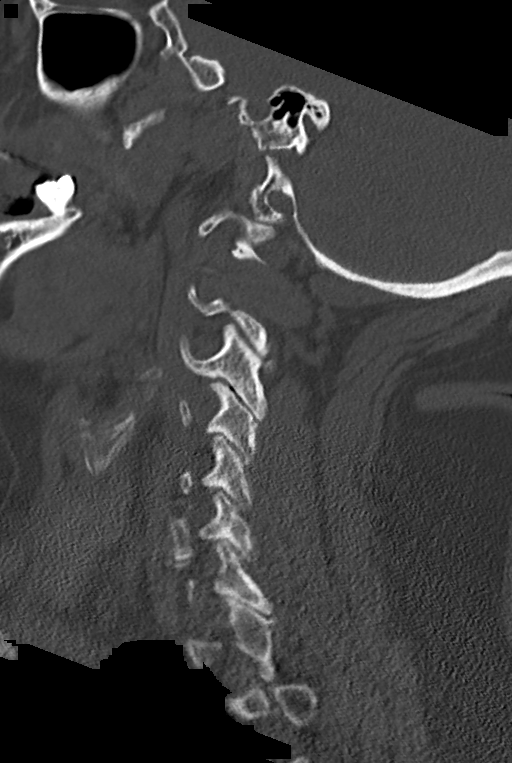
[im 26/61  bone]
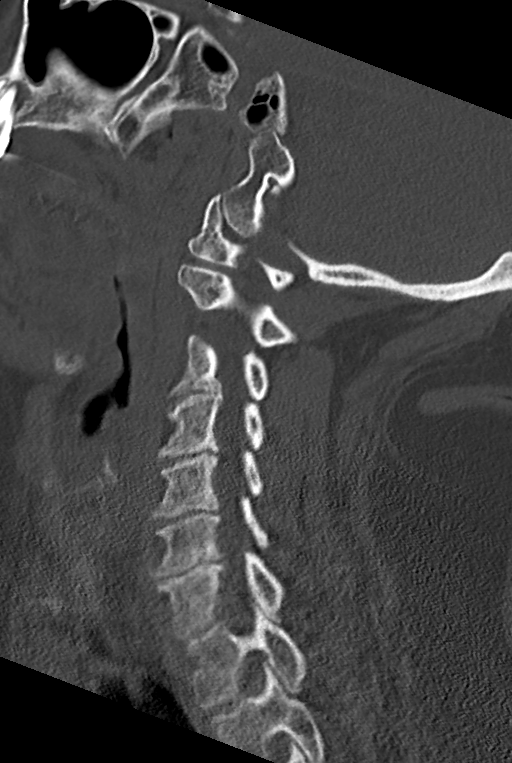
[im 31/61  soft-tissue]
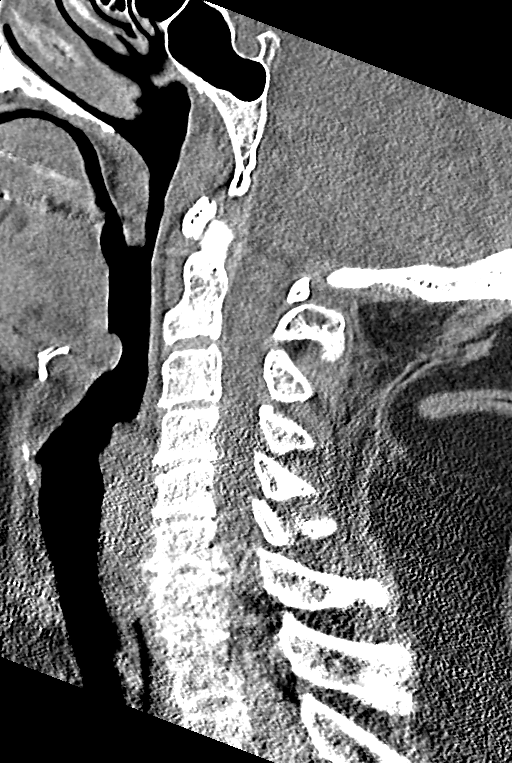
[im 31/61  bone]
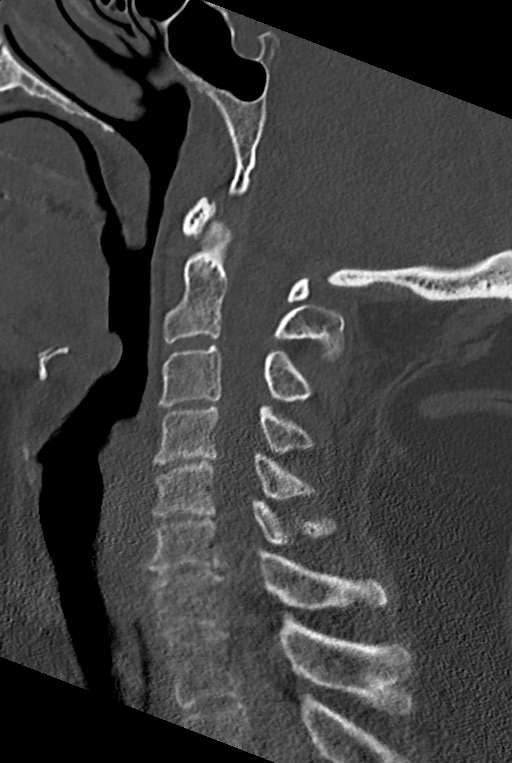
[im 36/61  bone]
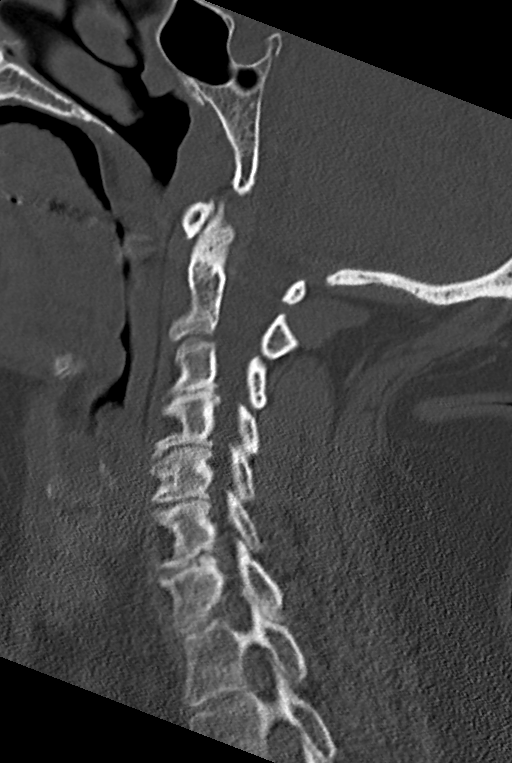
[im 41/61  bone]
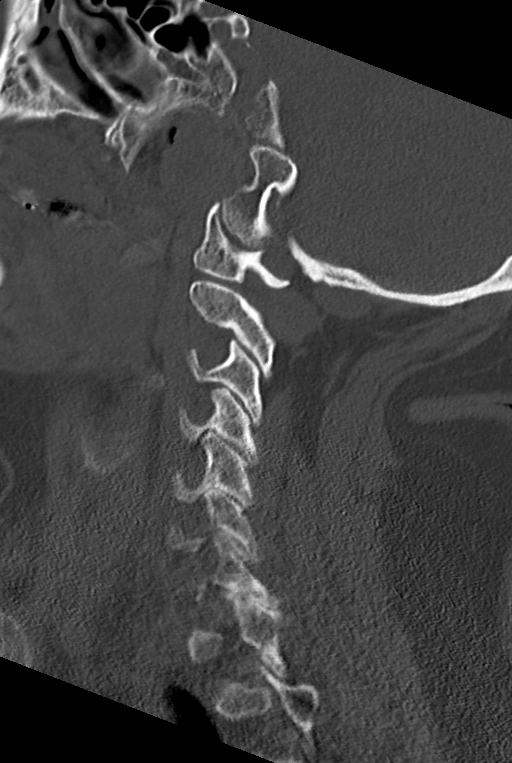

[12 of 33 positions shown; findings below may reference images not displayed]

FINDINGS: CT HEAD FINDINGS

Brain: The ventricles and sulci appropriate size for patient's age.
The gray-white matter discrimination is preserved. There is no acute
intracranial hemorrhage. No mass effect or midline shift no
extra-axial fluid collection.

Vascular: No hyperdense vessel or unexpected calcification.

Skull: Normal. Negative for fracture or focal lesion.

Sinuses/Orbits: No acute finding.

Other: None

CT CERVICAL SPINE FINDINGS

Alignment: No acute subluxation.

Skull base and vertebrae: No acute fracture. Osteopenia.

Soft tissues and spinal canal: No prevertebral fluid or swelling. No
visible canal hematoma.

Disc levels:  Multilevel degenerative changes.

Upper chest: Negative.

Other: Bilateral carotid bulb calcified plaques.
IMPRESSION: 1. No acute intracranial pathology.
2. No acute/traumatic cervical spine pathology.

## 2021-11-20 ENCOUNTER — Other Ambulatory Visit: Payer: Self-pay

## 2021-11-20 ENCOUNTER — Emergency Department (HOSPITAL_BASED_OUTPATIENT_CLINIC_OR_DEPARTMENT_OTHER)
Admission: EM | Admit: 2021-11-20 | Discharge: 2021-11-20 | Discharge status: Against Medical Advice | Payer: Self-pay | Attending: Emergency Medicine | Admitting: Emergency Medicine

## 2021-11-20 DIAGNOSIS — R21 Rash and other nonspecific skin eruption: Secondary | ICD-10-CM | POA: Insufficient documentation

## 2021-11-20 DIAGNOSIS — Z5321 Procedure and treatment not carried out due to patient leaving prior to being seen by health care provider: Secondary | ICD-10-CM | POA: Insufficient documentation

## 2021-11-20 NOTE — ED Triage Notes (Signed)
Pt POV c/o rash extending across lower abdomen x3 days.

## 2021-11-20 NOTE — ED Notes (Signed)
Informed by registration that patient left AMA

## 2022-01-10 ENCOUNTER — Emergency Department (HOSPITAL_BASED_OUTPATIENT_CLINIC_OR_DEPARTMENT_OTHER): Payer: Self-pay

## 2022-01-10 ENCOUNTER — Other Ambulatory Visit: Payer: Self-pay

## 2022-01-10 ENCOUNTER — Encounter (HOSPITAL_BASED_OUTPATIENT_CLINIC_OR_DEPARTMENT_OTHER): Payer: Self-pay | Admitting: Emergency Medicine

## 2022-01-10 DIAGNOSIS — K529 Noninfective gastroenteritis and colitis, unspecified: Secondary | ICD-10-CM | POA: Insufficient documentation

## 2022-01-10 DIAGNOSIS — F17200 Nicotine dependence, unspecified, uncomplicated: Secondary | ICD-10-CM | POA: Insufficient documentation

## 2022-01-10 DIAGNOSIS — Z8542 Personal history of malignant neoplasm of other parts of uterus: Secondary | ICD-10-CM | POA: Insufficient documentation

## 2022-01-10 LAB — COMPREHENSIVE METABOLIC PANEL
ALT: 17 U/L (ref 0–44)
AST: 18 U/L (ref 15–41)
Albumin: 3.4 g/dL — ABNORMAL LOW (ref 3.5–5.0)
Alkaline Phosphatase: 74 U/L (ref 38–126)
Anion gap: 6 (ref 5–15)
BUN: 18 mg/dL (ref 8–23)
CO2: 24 mmol/L (ref 22–32)
Calcium: 7.9 mg/dL — ABNORMAL LOW (ref 8.9–10.3)
Chloride: 107 mmol/L (ref 98–111)
Creatinine, Ser: 0.87 mg/dL (ref 0.44–1.00)
GFR, Estimated: >60 mL/min (ref >60)
Glucose, Bld: 150 mg/dL — ABNORMAL HIGH (ref 70–99)
Potassium: 3.4 mmol/L — ABNORMAL LOW (ref 3.5–5.1)
Sodium: 137 mmol/L (ref 135–145)
Total Bilirubin: 0.4 mg/dL (ref 0.3–1.2)
Total Protein: 6.3 g/dL — ABNORMAL LOW (ref 6.5–8.1)

## 2022-01-10 LAB — CBC WITH DIFFERENTIAL/PLATELET
Abs Immature Granulocytes: 0.08 10*3/uL — ABNORMAL HIGH (ref 0.00–0.07)
Basophils Absolute: 0.1 10*3/uL (ref 0.0–0.1)
Basophils Relative: 1 %
Eosinophils Absolute: 0.3 10*3/uL (ref 0.0–0.5)
Eosinophils Relative: 2 %
HCT: 38.7 % (ref 36.0–46.0)
Hemoglobin: 13.2 g/dL (ref 12.0–15.0)
Immature Granulocytes: 1 %
Lymphocytes Relative: 22 %
Lymphs Abs: 3.4 10*3/uL (ref 0.7–4.0)
MCH: 31.7 pg (ref 26.0–34.0)
MCHC: 34.1 g/dL (ref 30.0–36.0)
MCV: 93 fL (ref 80.0–100.0)
Monocytes Absolute: 0.8 10*3/uL (ref 0.1–1.0)
Monocytes Relative: 5 %
Neutro Abs: 11.1 10*3/uL — ABNORMAL HIGH (ref 1.7–7.7)
Neutrophils Relative %: 69 %
Platelets: 313 10*3/uL (ref 150–400)
RBC: 4.16 MIL/uL (ref 3.87–5.11)
RDW: 13.7 % (ref 11.5–15.5)
WBC: 15.8 10*3/uL — ABNORMAL HIGH (ref 4.0–10.5)
nRBC: 0 % (ref 0.0–0.2)

## 2022-01-10 LAB — TROPONIN I (HIGH SENSITIVITY): Troponin I (High Sensitivity): 4 ng/L (ref <18)

## 2022-01-10 LAB — LIPASE, BLOOD: Lipase: 43 U/L (ref 11–51)

## 2022-01-10 NOTE — ED Triage Notes (Addendum)
Ongoing nausea, epigastric abdominal pain x "months"  Describes pain as burning States she has "really bad acid reflux".  Pain worsened today after using cocaine which prompted pt to be evaluated.

## 2022-01-10 NOTE — ED Notes (Signed)
Patient transported to CT 

## 2022-01-11 ENCOUNTER — Encounter (HOSPITAL_BASED_OUTPATIENT_CLINIC_OR_DEPARTMENT_OTHER): Payer: Self-pay | Admitting: Emergency Medicine

## 2022-01-11 ENCOUNTER — Other Ambulatory Visit (HOSPITAL_COMMUNITY): Payer: Self-pay

## 2022-01-11 ENCOUNTER — Emergency Department (HOSPITAL_BASED_OUTPATIENT_CLINIC_OR_DEPARTMENT_OTHER)
Admission: EM | Admit: 2022-01-11 | Discharge: 2022-01-11 | Disposition: A | Discharge status: Home / Self Care | Payer: Self-pay | Attending: Emergency Medicine | Admitting: Emergency Medicine

## 2022-01-11 DIAGNOSIS — K529 Noninfective gastroenteritis and colitis, unspecified: Secondary | ICD-10-CM

## 2022-01-11 LAB — URINALYSIS, MICROSCOPIC (REFLEX)

## 2022-01-11 LAB — TROPONIN I (HIGH SENSITIVITY): Troponin I (High Sensitivity): 4 ng/L (ref <18)

## 2022-01-11 LAB — URINALYSIS, ROUTINE W REFLEX MICROSCOPIC
Bilirubin Urine: NEGATIVE
Glucose, UA: NEGATIVE mg/dL
Ketones, ur: NEGATIVE mg/dL
Leukocytes,Ua: NEGATIVE
Nitrite: POSITIVE — AB
Protein, ur: NEGATIVE mg/dL
Specific Gravity, Urine: >=1.03 (ref 1.005–1.030)
pH: 5.5 (ref 5.0–8.0)

## 2022-01-11 MED ORDER — DICYCLOMINE HCL 10 MG PO CAPS
10.0000 mg | ORAL_CAPSULE | Freq: Once | ORAL | Status: AC
Start: 2022-01-11 — End: 2022-01-11
  Administered 2022-01-11: 10 mg via ORAL
  Filled 2022-01-11: qty 1

## 2022-01-11 MED ORDER — LIDOCAINE VISCOUS HCL 2 % MT SOLN
15.0000 mL | Freq: Once | OROMUCOSAL | Status: AC
  Administered 2022-01-11: 15 mL via OROMUCOSAL
  Filled 2022-01-11: qty 15

## 2022-01-11 MED ORDER — ONDANSETRON 4 MG PO TBDP
8.0000 mg | ORAL_TABLET | Freq: Once | ORAL | Status: AC
  Administered 2022-01-11: 8 mg via ORAL
  Filled 2022-01-11: qty 2

## 2022-01-11 MED ORDER — ALUM & MAG HYDROXIDE-SIMETH 200-200-20 MG/5ML PO SUSP
30.0000 mL | Freq: Once | ORAL | Status: AC
  Administered 2022-01-11: 30 mL via ORAL
  Filled 2022-01-11: qty 30

## 2022-01-11 MED ORDER — OMEPRAZOLE 20 MG PO CPDR
20.0000 mg | DELAYED_RELEASE_CAPSULE | Freq: Every day | ORAL | 0 refills | Status: DC
  Filled 2022-01-11: qty 30, 30d supply, fill #0

## 2022-01-11 NOTE — ED Provider Notes (Signed)
[facility] EMERGENCY DEPARTMENT Provider Note   CSN: [id number] Arrival date & time: 01/10/22  2246     History  Chief Complaint  Patient presents with   Abdominal Pain    Maxcine Strong is a 61 y.o. female.  The history is provided by the patient and the spouse.  Abdominal Pain Pain location:  Epigastric Pain quality: aching   Pain radiates to:  Does not radiate Pain severity:  Moderate Onset quality:  Gradual Duration: months. Timing:  Constant Progression:  Worsening (worse tonight after cocaine use.  Today has associated nausea vomiting and diarrhea) Chronicity:  Chronic Context: not suspicious food intake and not trauma   Relieved by:  Nothing Worsened by:  Nothing Ineffective treatments:  None tried Associated symptoms: diarrhea and vomiting   Associated symptoms: no chest pain, no cough, no fever and no shortness of breath   Risk factors: not pregnant   Patient with GERD and cocaine use disorder who presents with ongoing epigastric pain for months. Today she had nausea vomiting and diarrhea.  Pain made worse tonight with cocaine use.  No chest pain.  No shortness of breath.  No back pain.  Also now complains of left ear pain and pain in her tongue after scraping it on a loose tooth.      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 Authorizing Provider  omeprazole (PRILOSEC) 20 MG capsule Take 1 capsule (20 mg total) by mouth daily. 01/11/22  Yes Wynter Isaacs, MD  escitalopram (LEXAPRO) 5 MG tablet Take 1 tablet (5 mg total) by mouth daily. 09/28/20 10/28/20  Briant Cedar, MD  gabapentin (NEURONTIN) 100 MG capsule Take 1 capsule  (100 mg total) by mouth 3 (three) times daily. 09/27/20   Briant Cedar, MD  omeprazole (PRILOSEC) 20 MG capsule Take 20 mg by mouth daily. Ran out    [provider]      Allergies    Darvocet [propoxyphene n-acetaminophen], Ibuprofen, and Tramadol    Review of Systems   Review of Systems  Constitutional:  Negative for fever.  HENT:  Negative for facial swelling.   Eyes:  Negative for redness.  Respiratory:  Negative for cough, shortness of breath, wheezing and stridor.   Cardiovascular:  Negative for chest pain and leg swelling.  Gastrointestinal:  Positive for abdominal pain, diarrhea and vomiting.  Musculoskeletal:  Negative for back pain.  All other systems reviewed and are negative.   Physical Exam Updated Vital Signs BP (!) 145/76 (BP Location: Left Arm)   Pulse 82   Temp 98.2 F (36.8 C) (Oral)   Resp 17   Ht '5\' 8"'$  (1.727 m)   Wt 99.8 kg   SpO2 98%   BMI 33.45 kg/m  Physical Exam Vitals and nursing note reviewed.  Constitutional:      General: She is not in acute distress.    Appearance: She is well-developed. She is not ill-appearing or diaphoretic.     Comments: Sleeping soundly in the room upon entrance but arouses easily to verbal stimuli but prefers to go back to sleep   HENT:  Head: Normocephalic and atraumatic.     Nose: Nose normal.  Eyes:     Pupils: Pupils are equal, round, and reactive to light.  Cardiovascular:     Rate and Rhythm: Normal rate and regular rhythm.     Pulses: Normal pulses.     Heart sounds: Normal heart sounds.  Pulmonary:     Effort: Pulmonary effort is normal. No respiratory distress.     Breath sounds: Normal breath sounds.  Abdominal:     General: Bowel sounds are normal. There is no distension.     Palpations: Abdomen is soft.     Tenderness: There is no abdominal tenderness. There is no guarding or rebound. Negative signs include Murphy's sign, Rovsing's sign and McBurney's sign.  Genitourinary:     Vagina: No vaginal discharge.  Musculoskeletal:        General: Normal range of motion.     Cervical back: Neck supple.  Skin:    General: Skin is warm and dry.     Capillary Refill: Capillary refill takes less than 2 seconds.     Findings: No erythema or rash.  Neurological:     General: No focal deficit present.     Deep Tendon Reflexes: Reflexes normal.  Psychiatric:        Mood and Affect: Mood normal.        Behavior: Behavior normal.     ED Results / Procedures / Treatments   Labs (all labs ordered are listed, but only abnormal results are displayed) Results for orders placed or performed during the hospital encounter of 01/11/22  Lipase, blood  Result Value Ref Range   Lipase 43 11 - 51 U/L  Comprehensive metabolic panel  Result Value Ref Range   Sodium 137 135 - 145 mmol/L   Potassium 3.4 (L) 3.5 - 5.1 mmol/L   Chloride 107 98 - 111 mmol/L   CO2 24 22 - 32 mmol/L   Glucose, Bld 150 (H) 70 - 99 mg/dL   BUN 18 8 - 23 mg/dL   Creatinine, Ser 0.87 0.44 - 1.00 mg/dL   Calcium 7.9 (L) 8.9 - 10.3 mg/dL   Total Protein 6.3 (L) 6.5 - 8.1 g/dL   Albumin 3.4 (L) 3.5 - 5.0 g/dL   AST 18 15 - 41 U/L   ALT 17 0 - 44 U/L   Alkaline Phosphatase 74 38 - 126 U/L   Total Bilirubin 0.4 0.3 - 1.2 mg/dL   GFR, Estimated >60 >60 mL/min   Anion gap 6 5 - 15  CBC with Differential  Result Value Ref Range   WBC 15.8 (H) 4.0 - 10.5 K/uL   RBC 4.16 3.87 - 5.11 MIL/uL   Hemoglobin 13.2 12.0 - 15.0 g/dL   HCT 38.7 36.0 - 46.0 %   MCV 93.0 80.0 - 100.0 fL   MCH 31.7 26.0 - 34.0 pg   MCHC 34.1 30.0 - 36.0 g/dL   RDW 13.7 11.5 - 15.5 %   Platelets 313 150 - 400 K/uL   nRBC 0.0 0.0 - 0.2 %   Neutrophils Relative % 69 %   Neutro Abs 11.1 (H) 1.7 - 7.7 K/uL   Lymphocytes Relative 22 %   Lymphs Abs 3.4 0.7 - 4.0 K/uL   Monocytes Relative 5 %   Monocytes Absolute 0.8 0.1 - 1.0 K/uL   Eosinophils Relative 2 %   Eosinophils Absolute 0.3 0.0 - 0.5 K/uL   Basophils Relative 1 %    Basophils Absolute 0.1 0.0 -  0.1 K/uL   Immature Granulocytes 1 %   Abs Immature Granulocytes 0.08 (H) 0.00 - 0.07 K/uL  Troponin I (High Sensitivity)  Result Value Ref Range   Troponin I (High Sensitivity) 4 <18 ng/L   CT Renal Stone Study  Result Date: 01/11/2022 CLINICAL DATA:  Flank pain.  Kidney stone suspected. EXAM: CT ABDOMEN AND PELVIS WITHOUT CONTRAST TECHNIQUE: Multidetector CT imaging of the abdomen and pelvis was performed following the standard protocol without IV contrast. RADIATION DOSE REDUCTION: This exam was performed according to the departmental dose-optimization program which includes automated exposure control, adjustment of the mA and/or kV according to patient size and/or use of iterative reconstruction technique. COMPARISON:  CTs with IV contrast dated 04/06/2020 and 01/22/2020 FINDINGS: Lower chest: No acute abnormality. The cardiac size is normal. Small hiatal hernia. Hepatobiliary: The extreme liver dome was excluded from the exam. Visualized liver is mildly enlarged 21 cm in length and is moderately steatotic, as before. No liver masses seen without contrast. The gallbladder and bile ducts are unremarkable. Pancreas: Unremarkable without contrast. Spleen: Slightly prominent, 13.5 cm length, no focal abnormality is seen without contrast. Adrenals/Urinary Tract: There is no adrenal mass. There is no focal abnormality is seen in the unenhanced renal cortex. A 4 mm nonobstructive caliceal stone is again noted in the midpole right kidney. There are few punctate nonobstructive caliceal stones in the right upper pole. There is no hydroureteronephrosis or ureteral stone. No left intrarenal stone is seen. There is an unremarkable bladder for the degree of distention. Stomach/Bowel: Thickened folds in the stomach, duodenum and jejunum. Rest of the small bowel is unremarkable. There are no mesenteric inflammatory changes. There is a normal appendix. There are colonic diverticula without  evidence of diverticulitis or colitis. Vascular/Lymphatic: Aortic atherosclerosis. No enlarged abdominal or pelvic lymph nodes. Reproductive: Status post hysterectomy. No adnexal masses. Other: There are multiple pelvic phleboliths. No free air, free hemorrhage or free fluid. There is no incarcerated hernia. Musculoskeletal: There is degenerative disc disease and spondylosis at all visualized lower thoracic levels and throughout the lumbar spine, with lower lumbar facet hypertrophy. IMPRESSION: 1. Nonobstructive nephrolithiasis on the right. No obstructing stone either side and no left intrarenal stones. 2. Gastroenteritis without small bowel obstruction or inflammatory changes 3. Moderate hepatic steatosis.  The liver dome not fully included. 4. Diverticulosis without evidence of diverticulitis. 5. Small umbilical fat hernia. 6. Diffuse degenerative disc disease. Electronically Signed   By: Telford Nab M.D.   On: 01/11/2022 00:12     EKG EKG Interpretation  Date/Time:  Wednesday January 10 2022 23:12:43 EDT Ventricular Rate:  90 PR Interval:  166 QRS Duration: 78 QT Interval:  386 QTC Calculation: 472 R Axis:   82 Text Interpretation: Normal sinus rhythm Confirmed by Randal Buba, Lekisha Mcghee ([id number]) on 01/11/2022 12:34:56 AM  Radiology CT Renal Stone Study  Result Date: 01/11/2022 CLINICAL DATA:  Flank pain.  Kidney stone suspected. EXAM: CT ABDOMEN AND PELVIS WITHOUT CONTRAST TECHNIQUE: Multidetector CT imaging of the abdomen and pelvis was performed following the standard protocol without IV contrast. RADIATION DOSE REDUCTION: This exam was performed according to the departmental dose-optimization program which includes automated exposure control, adjustment of the mA and/or kV according to patient size and/or use of iterative reconstruction technique. COMPARISON:  CTs with IV contrast dated 04/06/2020 and 01/22/2020 FINDINGS: Lower chest: No acute abnormality. The cardiac size is normal. Small hiatal  hernia. Hepatobiliary: The extreme liver dome was excluded from the exam. Visualized liver is mildly enlarged 21  cm in length and is moderately steatotic, as before. No liver masses seen without contrast. The gallbladder and bile ducts are unremarkable. Pancreas: Unremarkable without contrast. Spleen: Slightly prominent, 13.5 cm length, no focal abnormality is seen without contrast. Adrenals/Urinary Tract: There is no adrenal mass. There is no focal abnormality is seen in the unenhanced renal cortex. A 4 mm nonobstructive caliceal stone is again noted in the midpole right kidney. There are few punctate nonobstructive caliceal stones in the right upper pole. There is no hydroureteronephrosis or ureteral stone. No left intrarenal stone is seen. There is an unremarkable bladder for the degree of distention. Stomach/Bowel: Thickened folds in the stomach, duodenum and jejunum. Rest of the small bowel is unremarkable. There are no mesenteric inflammatory changes. There is a normal appendix. There are colonic diverticula without evidence of diverticulitis or colitis. Vascular/Lymphatic: Aortic atherosclerosis. No enlarged abdominal or pelvic lymph nodes. Reproductive: Status post hysterectomy. No adnexal masses. Other: There are multiple pelvic phleboliths. No free air, free hemorrhage or free fluid. There is no incarcerated hernia. Musculoskeletal: There is degenerative disc disease and spondylosis at all visualized lower thoracic levels and throughout the lumbar spine, with lower lumbar facet hypertrophy. IMPRESSION: 1. Nonobstructive nephrolithiasis on the right. No obstructing stone either side and no left intrarenal stones. 2. Gastroenteritis without small bowel obstruction or inflammatory changes 3. Moderate hepatic steatosis.  The liver dome not fully included. 4. Diverticulosis without evidence of diverticulitis. 5. Small umbilical fat hernia. 6. Diffuse degenerative disc disease. Electronically Signed   By: Telford Nab M.D.   On: 01/11/2022 00:12    Procedures Procedures    Medications Ordered in ED Medications  ondansetron (ZOFRAN-ODT) disintegrating tablet 8 mg (8 mg Oral Given 01/11/22 0055)  alum & mag hydroxide-simeth (MAALOX/MYLANTA) 200-200-20 MG/5ML suspension 30 mL (30 mLs Oral Given 01/11/22 0052)  dicyclomine (BENTYL) capsule 10 mg (10 mg Oral Given 01/11/22 0051)  lidocaine (XYLOCAINE) 2 % viscous mouth solution 15 mL (15 mLs Mouth/Throat Given 01/11/22 0052)    ED Course/ Medical Decision Making/ A&P                           Medical Decision Making Patient with epigastric pain for months.  Worse tonight with nausea vomiting and diarrhea.    Amount and/or Complexity of Data Reviewed Independent Historian: spouse    Details: See above  External Data Reviewed: notes.    Details: Previous notes reviewed  Labs: ordered.    Details: All labs reviewed:  negative troponin 4.  White count elevated 15.8, hemoglobin 13.2 normal and platelet count normal 313K.  Normal sodium potassium 137 slightly low 3.4 normal creatinine .87.  Slight elevation glucose 150. LFTs normal.  Lipase 43 normal.   Radiology: ordered and independent interpretation performed.    Details: No free air or stones in the ureters on CT by me  ECG/medicine tests: ordered and independent interpretation performed. Decision-making details documented in ED Course.  Risk OTC drugs. Prescription drug management. Risk Details: Ruled out for MI in the ED.  CT scan is consistent with gastroenteritis.  No vomiting or diarrhea in the ED.  Will give eating plan to reform stool.  Will start omeprazole for GERD.  Follow up with your PMD for ongoing care.  Do not use cocaine or other street drugs.  Strict return precautions.    king why or why not admission, treatments were needed:1} Final Clinical Impression(s) / ED Diagnoses Final diagnoses:  Gastroenteritis   Return for intractable cough, coughing up blood, fevers > 100.4  unrelieved by medication, shortness of breath, intractable vomiting, chest pain, shortness of breath, weakness, numbness, changes in speech, facial asymmetry, abdominal pain, passing out, Inability to tolerate liquids or food, cough, altered mental status or any concerns. No signs of systemic illness or infection. The patient is nontoxic-appearing on exam and vital signs are within normal limits.  I have reviewed the triage vital signs and the nursing notes. Pertinent labs & imaging results that were available during my care of the patient were reviewed by me and considered in my medical decision making (see chart for details). After history, exam, and medical workup I feel the patient has been appropriately medically screened and is safe for discharge home. Pertinent diagnoses were discussed with the patient. Patient was given return precautions.  Rx / DC Orders ED Discharge Orders          Ordered    omeprazole (PRILOSEC) 20 MG capsule  Daily        01/11/22 0055              Aislee Landgren, MD 01/11/22 5638

## 2022-02-10 ENCOUNTER — Other Ambulatory Visit (HOSPITAL_COMMUNITY): Payer: Self-pay

## 2022-03-12 ENCOUNTER — Emergency Department (HOSPITAL_COMMUNITY): Payer: Self-pay

## 2022-03-12 ENCOUNTER — Inpatient Hospital Stay (HOSPITAL_COMMUNITY)
Admission: EM | Admit: 2022-03-12 | Discharge: 2022-03-14 | DRG: 896 | Disposition: A | Discharge status: Home / Self Care | Payer: Self-pay | Attending: Student | Admitting: Student

## 2022-03-12 ENCOUNTER — Other Ambulatory Visit: Payer: Self-pay

## 2022-03-12 ENCOUNTER — Encounter (HOSPITAL_COMMUNITY): Payer: Self-pay | Admitting: Emergency Medicine

## 2022-03-12 ENCOUNTER — Inpatient Hospital Stay (HOSPITAL_COMMUNITY): Payer: Self-pay

## 2022-03-12 DIAGNOSIS — Y92009 Unspecified place in unspecified non-institutional (private) residence as the place of occurrence of the external cause: Secondary | ICD-10-CM

## 2022-03-12 DIAGNOSIS — F1490 Cocaine use, unspecified, uncomplicated: Secondary | ICD-10-CM | POA: Insufficient documentation

## 2022-03-12 DIAGNOSIS — F17210 Nicotine dependence, cigarettes, uncomplicated: Secondary | ICD-10-CM | POA: Diagnosis present

## 2022-03-12 DIAGNOSIS — R4182 Altered mental status, unspecified: Principal | ICD-10-CM

## 2022-03-12 DIAGNOSIS — R404 Transient alteration of awareness: Secondary | ICD-10-CM

## 2022-03-12 DIAGNOSIS — R296 Repeated falls: Secondary | ICD-10-CM | POA: Diagnosis present

## 2022-03-12 DIAGNOSIS — R509 Fever, unspecified: Secondary | ICD-10-CM | POA: Diagnosis present

## 2022-03-12 DIAGNOSIS — E876 Hypokalemia: Secondary | ICD-10-CM | POA: Diagnosis present

## 2022-03-12 DIAGNOSIS — E669 Obesity, unspecified: Secondary | ICD-10-CM | POA: Diagnosis present

## 2022-03-12 DIAGNOSIS — Z79899 Other long term (current) drug therapy: Secondary | ICD-10-CM

## 2022-03-12 DIAGNOSIS — Z6835 Body mass index (BMI) 35.0-35.9, adult: Secondary | ICD-10-CM

## 2022-03-12 DIAGNOSIS — Z886 Allergy status to analgesic agent status: Secondary | ICD-10-CM

## 2022-03-12 DIAGNOSIS — R062 Wheezing: Secondary | ICD-10-CM | POA: Diagnosis not present

## 2022-03-12 DIAGNOSIS — R410 Disorientation, unspecified: Secondary | ICD-10-CM | POA: Diagnosis present

## 2022-03-12 DIAGNOSIS — Z87442 Personal history of urinary calculi: Secondary | ICD-10-CM

## 2022-03-12 DIAGNOSIS — U071 COVID-19: Secondary | ICD-10-CM | POA: Diagnosis present

## 2022-03-12 DIAGNOSIS — F14921 Cocaine use, unspecified with intoxication delirium: Secondary | ICD-10-CM

## 2022-03-12 DIAGNOSIS — W19XXXA Unspecified fall, initial encounter: Secondary | ICD-10-CM | POA: Diagnosis present

## 2022-03-12 DIAGNOSIS — F17200 Nicotine dependence, unspecified, uncomplicated: Secondary | ICD-10-CM | POA: Diagnosis present

## 2022-03-12 DIAGNOSIS — F14121 Cocaine abuse with intoxication with delirium: Principal | ICD-10-CM | POA: Diagnosis present

## 2022-03-12 DIAGNOSIS — Z538 Procedure and treatment not carried out for other reasons: Secondary | ICD-10-CM | POA: Diagnosis not present

## 2022-03-12 DIAGNOSIS — R1010 Upper abdominal pain, unspecified: Secondary | ICD-10-CM | POA: Diagnosis present

## 2022-03-12 DIAGNOSIS — Z5900 Homelessness unspecified: Secondary | ICD-10-CM

## 2022-03-12 DIAGNOSIS — Z885 Allergy status to narcotic agent status: Secondary | ICD-10-CM

## 2022-03-12 LAB — CBC
HCT: 38.1 % (ref 36.0–46.0)
Hemoglobin: 12.4 g/dL (ref 12.0–15.0)
MCH: 31.4 pg (ref 26.0–34.0)
MCHC: 32.5 g/dL (ref 30.0–36.0)
MCV: 96.5 fL (ref 80.0–100.0)
Platelets: 234 10*3/uL (ref 150–400)
RBC: 3.95 MIL/uL (ref 3.87–5.11)
RDW: 13.6 % (ref 11.5–15.5)
WBC: 8.9 10*3/uL (ref 4.0–10.5)
nRBC: 0 % (ref 0.0–0.2)

## 2022-03-12 LAB — URINALYSIS, ROUTINE W REFLEX MICROSCOPIC
Bilirubin Urine: NEGATIVE
Glucose, UA: NEGATIVE mg/dL
Ketones, ur: NEGATIVE mg/dL
Leukocytes,Ua: NEGATIVE
Nitrite: NEGATIVE
Protein, ur: NEGATIVE mg/dL
Specific Gravity, Urine: 1.014 (ref 1.005–1.030)
pH: 5 (ref 5.0–8.0)

## 2022-03-12 LAB — ETHANOL: Alcohol, Ethyl (B): <10 mg/dL (ref <10)

## 2022-03-12 LAB — BLOOD GAS, VENOUS
Acid-Base Excess: 3.2 mmol/L — ABNORMAL HIGH (ref 0.0–2.0)
Bicarbonate: 27.9 mmol/L (ref 20.0–28.0)
O2 Saturation: 65.4 %
Patient temperature: 36.1
pCO2, Ven: 40 mmHg — ABNORMAL LOW (ref 44–60)
pH, Ven: 7.44 — ABNORMAL HIGH (ref 7.25–7.43)
pO2, Ven: 35 mmHg (ref 32–45)

## 2022-03-12 LAB — HIV ANTIBODY (ROUTINE TESTING W REFLEX): HIV Screen 4th Generation wRfx: NONREACTIVE

## 2022-03-12 LAB — COMPREHENSIVE METABOLIC PANEL
ALT: 22 U/L (ref 0–44)
AST: 26 U/L (ref 15–41)
Albumin: 3.7 g/dL (ref 3.5–5.0)
Alkaline Phosphatase: 67 U/L (ref 38–126)
Anion gap: 9 (ref 5–15)
BUN: 18 mg/dL (ref 8–23)
CO2: 24 mmol/L (ref 22–32)
Calcium: 8.6 mg/dL — ABNORMAL LOW (ref 8.9–10.3)
Chloride: 103 mmol/L (ref 98–111)
Creatinine, Ser: 0.85 mg/dL (ref 0.44–1.00)
GFR, Estimated: >60 mL/min (ref >60)
Glucose, Bld: 118 mg/dL — ABNORMAL HIGH (ref 70–99)
Potassium: 3.9 mmol/L (ref 3.5–5.1)
Sodium: 136 mmol/L (ref 135–145)
Total Bilirubin: 0.3 mg/dL (ref 0.3–1.2)
Total Protein: 7.2 g/dL (ref 6.5–8.1)

## 2022-03-12 LAB — RAPID URINE DRUG SCREEN, HOSP PERFORMED
Amphetamines: NOT DETECTED
Barbiturates: NOT DETECTED
Benzodiazepines: NOT DETECTED
Cocaine: POSITIVE — AB
Opiates: NOT DETECTED
Tetrahydrocannabinol: NOT DETECTED

## 2022-03-12 LAB — LIPASE, BLOOD: Lipase: 40 U/L (ref 11–51)

## 2022-03-12 LAB — MRSA NEXT GEN BY PCR, NASAL: MRSA by PCR Next Gen: NOT DETECTED

## 2022-03-12 LAB — SARS CORONAVIRUS 2 BY RT PCR: SARS Coronavirus 2 by RT PCR: POSITIVE — AB

## 2022-03-12 LAB — AMMONIA: Ammonia: 63 umol/L — ABNORMAL HIGH (ref 9–35)

## 2022-03-12 MED ORDER — NIRMATRELVIR/RITONAVIR (PAXLOVID)TABLET
3.0000 | ORAL_TABLET | Freq: Two times a day (BID) | ORAL | Status: DC
  Administered 2022-03-12 – 2022-03-14 (×4): 3 via ORAL
  Filled 2022-03-12: qty 30

## 2022-03-12 MED ORDER — VANCOMYCIN HCL IN DEXTROSE 1-5 GM/200ML-% IV SOLN
1000.0000 mg | Freq: Once | INTRAVENOUS | Status: AC
  Administered 2022-03-12: 1000 mg via INTRAVENOUS
  Filled 2022-03-12: qty 200

## 2022-03-12 MED ORDER — NALOXONE HCL 0.4 MG/ML IJ SOLN
0.4000 mg | INTRAMUSCULAR | Status: DC | PRN
  Administered 2022-03-12: 0.4 mg via INTRAVENOUS
  Filled 2022-03-12: qty 1

## 2022-03-12 MED ORDER — ENOXAPARIN SODIUM 40 MG/0.4ML IJ SOSY
40.0000 mg | PREFILLED_SYRINGE | INTRAMUSCULAR | Status: DC
  Administered 2022-03-12 – 2022-03-13 (×2): 40 mg via SUBCUTANEOUS
  Filled 2022-03-12 (×2): qty 0.4

## 2022-03-12 MED ORDER — SODIUM CHLORIDE 0.9 % IV SOLN: 2.0000 g | Freq: Every day | INTRAVENOUS | Status: DC

## 2022-03-12 MED ORDER — SODIUM CHLORIDE 0.9 % IV BOLUS
1000.0000 mL | Freq: Once | INTRAVENOUS | Status: AC
  Administered 2022-03-12: 1000 mL via INTRAVENOUS

## 2022-03-12 MED ORDER — ACETAMINOPHEN 650 MG RE SUPP: 650.0000 mg | Freq: Four times a day (QID) | RECTAL | Status: DC | PRN

## 2022-03-12 MED ORDER — IOHEXOL 300 MG/ML  SOLN
100.0000 mL | Freq: Once | INTRAMUSCULAR | Status: AC | PRN
  Administered 2022-03-12: 100 mL via INTRAVENOUS

## 2022-03-12 MED ORDER — CHLORHEXIDINE GLUCONATE CLOTH 2 % EX PADS
6.0000 | MEDICATED_PAD | Freq: Every day | CUTANEOUS | Status: DC
  Administered 2022-03-12 – 2022-03-14 (×3): 6 via TOPICAL

## 2022-03-12 MED ORDER — VANCOMYCIN HCL 750 MG/150ML IV SOLN
750.0000 mg | Freq: Two times a day (BID) | INTRAVENOUS | Status: DC
  Administered 2022-03-13: 750 mg via INTRAVENOUS
  Filled 2022-03-12 (×2): qty 150

## 2022-03-12 MED ORDER — ORAL CARE MOUTH RINSE: 15.0000 mL | OROMUCOSAL | Status: DC | PRN

## 2022-03-12 MED ORDER — SODIUM CHLORIDE 0.9 % IV SOLN: 2.0000 g | Freq: Once | INTRAVENOUS | Status: DC

## 2022-03-12 MED ORDER — VANCOMYCIN HCL IN DEXTROSE 1-5 GM/200ML-% IV SOLN
1000.0000 mg | INTRAVENOUS | Status: AC
  Administered 2022-03-12: 1000 mg via INTRAVENOUS
  Filled 2022-03-12: qty 200

## 2022-03-12 MED ORDER — SODIUM CHLORIDE 0.9 % IV SOLN: INTRAVENOUS | Status: DC

## 2022-03-12 MED ORDER — SODIUM CHLORIDE 0.9 % IV SOLN
2.0000 g | Freq: Once | INTRAVENOUS | Status: AC
  Administered 2022-03-12: 2 g via INTRAVENOUS
  Filled 2022-03-12: qty 20

## 2022-03-12 MED ORDER — ACETAMINOPHEN 325 MG PO TABS
650.0000 mg | ORAL_TABLET | Freq: Four times a day (QID) | ORAL | Status: DC | PRN
  Administered 2022-03-12 – 2022-03-14 (×3): 650 mg via ORAL
  Filled 2022-03-12 (×3): qty 2

## 2022-03-12 MED ORDER — SODIUM CHLORIDE 0.9 % IV SOLN
2.0000 g | Freq: Every day | INTRAVENOUS | Status: DC
  Administered 2022-03-13: 2 g via INTRAVENOUS
  Filled 2022-03-12: qty 20

## 2022-03-12 NOTE — ED Provider Notes (Signed)
[facility] DEPT Provider Note   CSN: [id number] Arrival date & time: 03/12/22  5176     History  Chief Complaint  Patient presents with   Abdominal Pain    Kelli Jenkins is a 61 y.o. female.   Abdominal Pain    Patient presents to the ER for evaluation of upper abdominal pain.  Patient has history of cocaine abuse, SIRS, pyelonephritis, urinary tract infections, nephrolithiasis.  Patient was brought in by EMS.  She had been complaining of sharp stabbing abdominal pain.  Patient admitted to cocaine use recently.  Patient was found to have a white powdery substance on her.  Patient reports vomiting.  It is hard to obtain additional information from the patient.  Patient is somnolent but will wake up and start to answer my questions.  She does not elaborate specifically on the symptoms she has been having  Home Medications Prior to Admission medications   Medication Sig Start Date End Date Taking? Authorizing Provider  escitalopram (LEXAPRO) 5 MG tablet Take 1 tablet (5 mg total) by mouth daily. 09/28/20 10/28/20  Briant Cedar, MD  gabapentin (NEURONTIN) 100 MG capsule Take 1 capsule (100 mg total) by mouth 3 (three) times daily. 09/27/20   Briant Cedar, MD  omeprazole (PRILOSEC) 20 MG capsule Take 20 mg by mouth daily. Ran out    [provider]  omeprazole (PRILOSEC) 20 MG capsule Take 1 capsule (20 mg total) by mouth daily. 01/11/22   Palumbo, April, MD      Allergies    Darvocet [propoxyphene n-acetaminophen], Ibuprofen, and Tramadol    Review of Systems   Review of Systems  Gastrointestinal:  Positive for abdominal pain.    Physical Exam Updated Vital Signs BP 112/60   Pulse (!) 107   Temp (!) 101.1 F (38.4 C) (Oral)   Resp (!) 29   Ht 1.727 m ('5\' 8"'$ )   Wt 107 kg   SpO2 91%   BMI 35.88 kg/m  Physical Exam Vitals and nursing note reviewed.  Constitutional:      Appearance: She is well-developed. She is  ill-appearing.  HENT:     Head: Normocephalic and atraumatic.     Comments: Mucous membranes dry    Right Ear: External ear normal.     Left Ear: External ear normal.  Eyes:     General: No scleral icterus.       Right eye: No discharge.        Left eye: No discharge.     Conjunctiva/sclera: Conjunctivae normal.  Neck:     Trachea: No tracheal deviation.  Cardiovascular:     Rate and Rhythm: Normal rate and regular rhythm.  Pulmonary:     Effort: Pulmonary effort is normal. No respiratory distress.     Breath sounds: Normal breath sounds. No stridor. No wheezing or rales.  Abdominal:     General: Bowel sounds are normal. There is no distension.     Palpations: Abdomen is soft. There is no pulsatile mass.     Tenderness: There is no abdominal tenderness. There is no guarding or rebound.  Musculoskeletal:        General: No tenderness or deformity.     Cervical back: Neck supple.  Skin:    General: Skin is warm and dry.     Findings: No rash.  Neurological:     General: No focal deficit present.     Mental Status: She is lethargic.  GCS: GCS eye subscore is 3. GCS verbal subscore is 4. GCS motor subscore is 6.     Cranial Nerves: No cranial nerve deficit (no facial droop, extraocular movements intact, no slurred speech).     Sensory: No sensory deficit.     Motor: No abnormal muscle tone or seizure activity.     Coordination: Coordination normal.  Psychiatric:        Mood and Affect: Mood normal.     ED Results / Procedures / Treatments   Labs (all labs ordered are listed, but only abnormal results are displayed) Labs Reviewed  SARS CORONAVIRUS 2 BY RT PCR - Abnormal; Notable for the following components:      Result Value   SARS Coronavirus 2 by RT PCR POSITIVE (*)    All other components within normal limits  COMPREHENSIVE METABOLIC PANEL - Abnormal; Notable for the following components:   Glucose, Bld 118 (*)    Calcium 8.6 (*)    All other components within  normal limits  URINALYSIS, ROUTINE W REFLEX MICROSCOPIC - Abnormal; Notable for the following components:   Hgb urine dipstick MODERATE (*)    Bacteria, UA FEW (*)    All other components within normal limits  AMMONIA - Abnormal; Notable for the following components:   Ammonia 63 (*)    All other components within normal limits  BLOOD GAS, VENOUS - Abnormal; Notable for the following components:   pH, Ven 7.44 (*)    pCO2, Ven 40 (*)    Acid-Base Excess 3.2 (*)    All other components within normal limits  RAPID URINE DRUG SCREEN, HOSP PERFORMED - Abnormal; Notable for the following components:   Cocaine POSITIVE (*)    All other components within normal limits  LIPASE, BLOOD  CBC  ETHANOL    EKG EKG Interpretation  Date/Time:  Monday March 12 2022 07:21:44 EST Ventricular Rate:  99 PR Interval:  176 QRS Duration: 82 QT Interval:  331 QTC Calculation: 425 R Axis:   88 Text Interpretation: Sinus rhythm Borderline right axis deviation No significant change since last tracing Confirmed by Dorie Rank [phone]) on 03/12/2022 7:29:45 AM  Radiology CT Head Wo Contrast  Result Date: 03/12/2022 CLINICAL DATA:  Altered mental status EXAM: CT HEAD WITHOUT CONTRAST TECHNIQUE: Contiguous axial images were obtained from the base of the skull through the vertex without intravenous contrast. RADIATION DOSE REDUCTION: This exam was performed according to the departmental dose-optimization program which includes automated exposure control, adjustment of the mA and/or kV according to patient size and/or use of iterative reconstruction technique. COMPARISON:  CT Brain 10/31/20 FINDINGS: Brain: No evidence of acute infarction, hemorrhage, hydrocephalus, extra-axial collection or mass lesion/mass effect. Asymmetric hypodensity in the left cerebellum, nonspecific, and possibly artifactual. Vascular: No hyperdense vessel or unexpected calcification. Skull: Negative for fracture. Focal hypodense lesion  in the left frontal calvarium, nonspecific and unchanged from 2022 Sinuses/Orbits: No acute finding. Other: Asymmetric mucosal thickening in the right nasal cavity. IMPRESSION: 1. Apparent asymmetric hypodensity in the left cerebellum may be artifactual, but is new compared to 2022. If there is clinical concern for an infarct, further evaluation with a brain MRI is recommended. 2. Asymmetric mucosal thickening in the right nasal cavity. Electronically Signed   By: Marin Roberts M.D.   On: 03/12/2022 11:01   CT ABDOMEN PELVIS W CONTRAST  Result Date: 03/12/2022 CLINICAL DATA:  Abdominal pain. EXAM: CT ABDOMEN AND PELVIS WITH CONTRAST TECHNIQUE: Multidetector CT imaging of the abdomen and pelvis  was performed using the standard protocol following bolus administration of intravenous contrast. RADIATION DOSE REDUCTION: This exam was performed according to the departmental dose-optimization program which includes automated exposure control, adjustment of the mA and/or kV according to patient size and/or use of iterative reconstruction technique. CONTRAST:  125m OMNIPAQUE IOHEXOL 300 MG/ML  SOLN COMPARISON:  01/10/2022 FINDINGS: Lower chest: No acute abnormality. Hepatobiliary: Diffuse hepatic steatosis identified. No focal liver abnormality. Gallbladder normal. No bile duct dilatation. Pancreas: Unremarkable. No pancreatic ductal dilatation or surrounding inflammatory changes. Spleen: Normal in size without focal abnormality. Adrenals/Urinary Tract: Normal adrenal glands. Cortical calcification within the lateral cortex of the interpolar right kidney measures 5 mm. 1 cm cyst noted within the inferior pole of the left kidney. No follow-up imaging recommended. No suspicious mass or hydronephrosis. No signs of hydronephrosis or hydroureter. Urinary bladder is unremarkable. Stomach/Bowel: Stomach appears normal. The appendix is visualized and is within normal limits. No bowel wall thickening, inflammation, or  distension. Vascular/Lymphatic: Aortic atherosclerosis. No aneurysm. No signs of abdominopelvic adenopathy. Reproductive: Status post hysterectomy. No adnexal masses. Other: No free fluid or fluid collections identified. No signs of pneumoperitoneum. 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 No acute or suspicious findings. IMPRESSION: 1. No acute findings within the abdomen or pelvis. 2. Hepatic steatosis. 3. Cortical calcification within the lateral cortex of the interpolar right kidney measures 5 mm. No signs of hydronephrosis or hydroureter. 4.  Aortic Atherosclerosis (ICD10-I70.0). Electronically Signed   By: TKerby MoorsM.D.   On: 03/12/2022 10:55   DG Chest Portable 1 View  Result Date: 03/12/2022 CLINICAL DATA:  Fever EXAM: PORTABLE CHEST 1 VIEW COMPARISON:  Chest x-ray dated June 25, 2020 FINDINGS: The heart size and mediastinal contours are within normal limits. Both lungs are clear. The visualized skeletal structures are unremarkable. IMPRESSION: No active disease. Electronically Signed   By: LYetta GlassmanM.D.   On: 03/12/2022 08:57    Procedures .Lumbar Puncture  Date/Time: 03/12/2022 12:07 PM  Performed by: KDorie Rank MD Authorized by: KDorie Rank MD   Consent:    Consent obtained:  Verbal   Consent given by:  Patient Universal protocol:    Patient identity confirmed:  Verbally with patient Pre-procedure details:    Procedure purpose:  Diagnostic   Preparation: Patient was prepped and draped in usual sterile fashion   Sedation:    Sedation type:  None Anesthesia:    Anesthesia method:  Local infiltration   Local anesthetic:  Lidocaine 1% w/o epi Procedure details:    Lumbar space:  L4-L5 interspace   Patient position:  R lateral decubitus   Needle gauge:  22   Ultrasound guidance: no     Number of attempts:  2 Post-procedure details:    Procedure completion:  Procedure terminated electively by provider Comments:      Unsuccessful attempt.  Patient had difficulty in the appropriate position .Critical Care  Performed by: KDorie Rank MD Authorized by: KDorie Rank MD   Critical care provider statement:    Critical care time (minutes):  35   Critical care was time spent personally by me on the following activities:  Development of treatment plan with patient or surrogate, discussions with consultants, evaluation of patient's response to treatment, examination of patient, ordering and review of laboratory studies, ordering and review of radiographic studies, ordering and performing treatments and interventions, pulse oximetry, re-evaluation of patient's condition and review of old charts     Medications Ordered in ED Medications  sodium chloride 0.9 % bolus 1,000 mL (1,000 mLs Intravenous New Bag/Given 03/12/22 0836)    And  0.9 %  sodium chloride infusion (has no administration in time range)  vancomycin (VANCOCIN) IVPB 1000 mg/200 mL premix (has no administration in time range)  cefTRIAXone (ROCEPHIN) 2 g in sodium chloride 0.9 % 100 mL IVPB (2 g Intravenous New Bag/Given 03/12/22 1206)  iohexol (OMNIPAQUE) 300 MG/ML solution 100 mL (100 mLs Intravenous Contrast Given 03/12/22 1023)    ED Course/ Medical Decision Making/ A&P Clinical Course as of 03/12/22 1227  Mon Mar 12, 2022  1000 Urinalysis, Routine w reflex microscopic(!) Few bacteria noted [JK]  1000 Ammonia(!) Elevated [JK]  1000 Lipase, blood Normal [JK]  1000 Ethanol Negative [JK]  1001 Blood gas, venous (at [facility], not at [facility])(!) No acidosis noted [JK]  1001 DG Chest Portable 1 View Chest x-ray without acute findings [JK]  1125 UDS positive for cocaine [JK]  1126 CT ABDOMEN PELVIS W CONTRAST Abdominal CT scan without acute findings [JK]  1126 CT Head Wo Contrast Head CT with new finding cannot exclude infarct [JK]  1225 Case discussed with Dr Lonn [place]  regarding admission [JK]    Clinical Course User Index [JK] Dorie Rank, MD                            Medical Decision Making Differential diagnosis includes but not limited to cocaine intoxication, meningitis encephalitis pneumonia, acute abdominal infection, sepsis  Problems Addressed: Altered mental status, unspecified altered mental status type: acute illness or injury that poses a threat to life or bodily functions Cocaine intoxication delirium ([place]): acute illness or injury that poses a threat to life or bodily functions COVID-19 viremia: acute illness or injury that poses a threat to life or bodily functions  Amount and/or Complexity of Data Reviewed Labs: ordered. Decision-making details documented in ED Course. Radiology: ordered. Decision-making details documented in ED Course.  Risk Prescription drug management. Decision regarding hospitalization.   Presented to the ED for altered mental status fever.,  Probable recent cocaine use.  Patient was confused and somnolent in the ED.  She would wake up and answer a few questions but is difficult to obtain clear history.  Patient did not have any electrolyte abnormalities her CBC was normal.  Her urinalysis did not suggest an infection.  Chest x-ray did not show pneumonia.  Her head CT and abdominal CT did not show any definite acute findings although cannot exclude infarct.  Patient continued to have a fever.  Did attempt lumbar puncture considering encephalitis and meningitis however unsuccessful attempt.  Subsequently the patient's COVID test has returned positive.  We do have a source of infection and this is with cocaine is Certainly possible the etiology of her confusion.  She has had intermittent oxygen requirement here in the ED.  Patient has been started on empiric antibiotics.  Could consider fluoroscopy guided LP versus observation considering we do have a source of infection at this time.  I will consult the medical service for admission and treatment        Final Clinical Impression(s) / ED  Diagnoses Final diagnoses:  Altered mental status, unspecified altered mental status type  Cocaine intoxication delirium ([place])  COVID-19 viremia    Rx / DC Orders ED Discharge Orders     None         Dorie Rank, MD 03/12/22 1227

## 2022-03-12 NOTE — H&P (Signed)
History and Physical    Kelli Jenkins QPY:[id number] DOB: Oct 28, 1960 DOA: 03/12/2022  PCP: Patient, No Pcp Per   Patient coming from: Home  Chief Complaint: abd pain  HPI: Kelli Jenkins is a 61 y.o. female with medical history significant of cocaine abuse, nephrolithiasis, tobacco use who presents from home along with her boyfriend for the evaluation of upper abdominal pain.  Patient was confused on presentation and she is a very poor historian so all information was taken from the boyfriend at bedside.  As per the boyfriend, patient started falling at home.  She fell twice.  She was complaining of body ache all over and also had fever.  Patient smokes cocaine every day and the last one was yesterday.  When EMS arrived, she complained of abdominal pain.  EMS also saw a white powdery substance near her.  Patient also vomited. On presentation, since she was somnolent.  She was febrile after arrival with sinus tachycardia.  Blood pressure was stable. Patient seen and examined at bedside this afternoon.  Patient was lying on the bed during  my evaluation.  Wakes up on calling her name but falls back asleep instantly.  Denies any abdominal pain during my evaluation.  Denies any chest pain, shortness of breath, cough , dysuria.  Complains of some headache.  ED Course: Remained febrile, remained in mild sinus tachycardia.  COVID screen test positive.  Chest x-ray did not show any pneumonia CT head showed asymmetric hypodensity in the left cerebellum .  CT abdomen/pelvis without any acute intra-abdominal findings.  Lumbar puncture was also attempted by ED physician but it was unsuccessful and later aborted after finding of positive COVID test.  Patient given a dose of ceftriaxone and vancomycin.   Review of Systems: As per HPI otherwise 10 point review of systems negative.    Past Medical History:  Diagnosis Date   Cancer ([place])    uterine   Cocaine addiction ([place])    Hypokalemia     Nephrolithiasis    Pyelonephritis    SIRS (systemic inflammatory response syndrome) ([facility])    Tobacco abuse    UTI (urinary tract infection) 06/2013    Past Surgical History:  Procedure Laterality Date   ESOPHAGOGASTRODUODENOSCOPY N/A 09/22/2016   Procedure: ESOPHAGOGASTRODUODENOSCOPY (EGD);  Surgeon: Ronald Lobo, MD;  Location: [facility];  Service: Endoscopy;  Laterality: N/A;   RENAL ARTERY STENT     stones   TUBAL LIGATION     VAGINAL HYSTERECTOMY       reports that she has been smoking cigarettes. She has been smoking an average of 1 pack per day. She has never used smokeless tobacco. She reports current alcohol use. She reports current drug use. Drug: Cocaine.  Allergies  Allergen Reactions   Darvocet [Propoxyphene N-Acetaminophen] Swelling    OK to take acetaminophen   Ibuprofen Swelling    Ok to take ketorolac   Tramadol Swelling    History reviewed. No pertinent family history.   Prior to Admission medications   Medication Sig Start Date End Date Taking? Authorizing Provider  escitalopram (LEXAPRO) 5 MG tablet Take 1 tablet (5 mg total) by mouth daily. 09/28/20 10/28/20  Briant Cedar, MD  gabapentin (NEURONTIN) 100 MG capsule Take 1 capsule (100 mg total) by mouth 3 (three) times daily. 09/27/20   Briant Cedar, MD  omeprazole (PRILOSEC) 20 MG capsule Take 20 mg by mouth daily. Ran out    [provider]  omeprazole (PRILOSEC) 20 MG capsule Take  1 capsule (20 mg total) by mouth daily. 01/11/22   Palumbo, April, MD    Physical Exam: Vitals:   03/12/22 1045 03/12/22 1107 03/12/22 1130 03/12/22 1300  BP: (!) 118/57  112/60 113/68  Pulse: (!) 107  (!) 107 (!) 104  Resp: 15  (!) 29 17  Temp:  (!) 101.1 F (38.4 C)    TempSrc:  Oral    SpO2: 90%  91% 96%  Weight:      Height:        Constitutional: confused,obese Vitals:   03/12/22 1045 03/12/22 1107 03/12/22 1130 03/12/22 1300  BP: (!) 118/57  112/60 113/68  Pulse: (!) 107  (!) 107 (!)  104  Resp: 15  (!) 29 17  Temp:  (!) 101.1 F (38.4 C)    TempSrc:  Oral    SpO2: 90%  91% 96%  Weight:      Height:       Eyes: eyes closed ENMT: Mucous membranes are moist.  Neck: normal, supple, no masses, no thyromegaly Respiratory: clear to auscultation bilaterally, no wheezing, no crackles. Normal respiratory effort. No accessory muscle use.  Cardiovascular: Regular rate and rhythm, no murmurs / rubs / gallops. No extremity edema.  Abdomen: no tenderness, no masses palpated. No hepatosplenomegaly. Bowel sounds positive.  Musculoskeletal: no clubbing / cyanosis. No joint deformity upper and lower extremities.  Skin: no rashes, lesions, ulcers. No induration Neurologic: CN 2-12 grossly intact.  Strength 5/5 in all 4.  Psychiatric: somnolent but wakes up and talks when conversation started.  Overall oriented  Foley Catheter:None  Labs on Admission: I have personally reviewed following labs and imaging studies  CBC: Recent Labs  Lab 03/12/22 0832  WBC 8.9  HGB 12.4  HCT 38.1  MCV 96.5  PLT 938   Basic Metabolic Panel: Recent Labs  Lab 03/12/22 0832  NA 136  K 3.9  CL 103  CO2 24  GLUCOSE 118*  BUN 18  CREATININE 0.85  CALCIUM 8.6*   GFR: Estimated Creatinine Clearance: 87.9 mL/min (by C-G formula based on SCr of 0.85 mg/dL). Liver Function Tests: Recent Labs  Lab 03/12/22 0832  AST 26  ALT 22  ALKPHOS 67  BILITOT 0.3  PROT 7.2  ALBUMIN 3.7   Recent Labs  Lab 03/12/22 0832  LIPASE 40   Recent Labs  Lab 03/12/22 0832  AMMONIA 63*   Coagulation Profile: No results for input(s): "INR", "PROTIME" in the last 168 hours. Cardiac Enzymes: No results for input(s): "CKTOTAL", "CKMB", "CKMBINDEX", "TROPONINI" in the last 168 hours. BNP (last 3 results) No results for input(s): "PROBNP" in the last 8760 hours. HbA1C: No results for input(s): "HGBA1C" in the last 72 hours. CBG: No results for input(s): "GLUCAP" in the last 168 hours. Lipid  Profile: No results for input(s): "CHOL", "HDL", "LDLCALC", "TRIG", "CHOLHDL", "LDLDIRECT" in the last 72 hours. Thyroid Function Tests: No results for input(s): "TSH", "T4TOTAL", "FREET4", "T3FREE", "THYROIDAB" in the last 72 hours. Anemia Panel: No results for input(s): "VITAMINB12", "FOLATE", "FERRITIN", "TIBC", "IRON", "RETICCTPCT" in the last 72 hours. Urine analysis:    Component Value Date/Time   COLORURINE YELLOW 03/12/2022 [place] 03/12/2022 0835   LABSPEC 1.014 03/12/2022 0835   PHURINE 5.0 03/12/2022 0835   GLUCOSEU NEGATIVE 03/12/2022 0835   HGBUR MODERATE (A) 03/12/2022 0835   BILIRUBINUR NEGATIVE 03/12/2022 0835   KETONESUR NEGATIVE 03/12/2022 0835   PROTEINUR NEGATIVE 03/12/2022 0835   UROBILINOGEN 1.0 03/04/2015 1904   NITRITE NEGATIVE 03/12/2022  [place] 03/12/2022 0835    Radiological Exams on Admission: CT Head Wo Contrast  Result Date: 03/12/2022 CLINICAL DATA:  Altered mental status EXAM: CT HEAD WITHOUT CONTRAST TECHNIQUE: Contiguous axial images were obtained from the base of the skull through the vertex without intravenous contrast. RADIATION DOSE REDUCTION: This exam was performed according to the departmental dose-optimization program which includes automated exposure control, adjustment of the mA and/or kV according to patient size and/or use of iterative reconstruction technique. COMPARISON:  CT Brain 10/31/20 FINDINGS: Brain: No evidence of acute infarction, hemorrhage, hydrocephalus, extra-axial collection or mass lesion/mass effect. Asymmetric hypodensity in the left cerebellum, nonspecific, and possibly artifactual. Vascular: No hyperdense vessel or unexpected calcification. Skull: Negative for fracture. Focal hypodense lesion in the left frontal calvarium, nonspecific and unchanged from 2022 Sinuses/Orbits: No acute finding. Other: Asymmetric mucosal thickening in the right nasal cavity. IMPRESSION: 1. Apparent  asymmetric hypodensity in the left cerebellum may be artifactual, but is new compared to 2022. If there is clinical concern for an infarct, further evaluation with a brain MRI is recommended. 2. Asymmetric mucosal thickening in the right nasal cavity. Electronically Signed   By: Marin Roberts M.D.   On: 03/12/2022 11:01   CT ABDOMEN PELVIS W CONTRAST  Result Date: 03/12/2022 CLINICAL DATA:  Abdominal pain. EXAM: CT ABDOMEN AND PELVIS WITH CONTRAST TECHNIQUE: Multidetector CT imaging of the abdomen and pelvis was performed using the standard protocol following bolus administration of intravenous contrast. RADIATION DOSE REDUCTION: This exam was performed according to the departmental dose-optimization program which includes automated exposure control, adjustment of the mA and/or kV according to patient size and/or use of iterative reconstruction technique. CONTRAST:  11m OMNIPAQUE IOHEXOL 300 MG/ML  SOLN COMPARISON:  01/10/2022 FINDINGS: Lower chest: No acute abnormality. Hepatobiliary: Diffuse hepatic steatosis identified. No focal liver abnormality. Gallbladder normal. No bile duct dilatation. Pancreas: Unremarkable. No pancreatic ductal dilatation or surrounding inflammatory changes. Spleen: Normal in size without focal abnormality. Adrenals/Urinary Tract: Normal adrenal glands. Cortical calcification within the lateral cortex of the interpolar right kidney measures 5 mm. 1 cm cyst noted within the inferior pole of the left kidney. No follow-up imaging recommended. No suspicious mass or hydronephrosis. No signs of hydronephrosis or hydroureter. Urinary bladder is unremarkable. Stomach/Bowel: Stomach appears normal. The appendix is visualized and is within normal limits. No bowel wall thickening, inflammation, or distension. Vascular/Lymphatic: Aortic atherosclerosis. No aneurysm. No signs of abdominopelvic adenopathy. Reproductive: Status post hysterectomy. No adnexal masses. Other: No free fluid or fluid  collections identified. No signs of pneumoperitoneum. 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 No acute or suspicious findings. IMPRESSION: 1. No acute findings within the abdomen or pelvis. 2. Hepatic steatosis. 3. Cortical calcification within the lateral cortex of the interpolar right kidney measures 5 mm. No signs of hydronephrosis or hydroureter. 4.  Aortic Atherosclerosis (ICD10-I70.0). Electronically Signed   By: TKerby MoorsM.D.   On: 03/12/2022 10:55   DG Chest Portable 1 View  Result Date: 03/12/2022 CLINICAL DATA:  Fever EXAM: PORTABLE CHEST 1 VIEW COMPARISON:  Chest x-ray dated June 25, 2020 FINDINGS: The heart size and mediastinal contours are within normal limits. Both lungs are clear. The visualized skeletal structures are unremarkable. IMPRESSION: No active disease. Electronically Signed   By: LYetta GlassmanM.D.   On: 03/12/2022 08:57     Assessment/Plan Principal Problem:   AMS (altered mental status) Active Problems:   Tobacco use disorder   Cocaine use   COVID-19  virus infection   AMS: Multifactorial secondary to cocaine use, COVID.  Also febrile. Low suspicion for CNS infection.  Lumbar puncture attempted at bedside but was unsuccessful.  If she continues to be confused, will attempt lumbar puncture by fluoroscopic guidance. CT head did not show any acute intracranial findings but showed asymmetric hypodensity in the left cerebellum.We will get MRI of the brain. We will also give a dose of Narcan  Fever: No leukocytosis.  Febrile to 101.1 F in the ED.  Likely from [place].  UA not suspicious for UTI.  Given a dose of ceftriaxone, vancomycin in the ED. we will continue to board for antibiotics for now.  We will collect cultures.  Fever could be from COVID.  Chest x-ray did not show any pneumonia  COVID-positive: No respiratory symptoms.  Saturating fine on room air.  Lungs clear to auscultation.  Chest x-ray without finding  of any pneumonia.No indication to start on any steroids.Started on paxlovid  Cocaine use: Smokes cocaine daily.  UDS positive.  Counseled for cessation.  Abdominal pain.  This was the initial complaint.  CT abdomen/pelvis did not show any acute findings.  Abdomen examination was benign  Morbid obesity: BMI 35.8         Severity of Illness: The appropriate patient status for this patient is OBSERVATION.   DVT prophylaxis: Lovenox Code Status: Full Family Communication: Boyfriend at d Consults called: None     Shelly Coss MD [facility]  03/12/2022, 1:57 PM

## 2022-03-12 NOTE — ED Triage Notes (Addendum)
Per EMS pt from home, c/o upper abdominal sharp stabbing pain. Pt a poor historian, displaying a flight of ideas, says she took cocaine but not sure if it was today or yesterday.    140/68 104HR 97% RA 20RR  White substance was found on pt and taken from her.  Pt reports she has been throwing up.

## 2022-03-12 NOTE — ED Notes (Signed)
Attempted to get blood cultures on patient and she was not cooperative. Will attempt again

## 2022-03-12 NOTE — Progress Notes (Signed)
Pharmacy Antibiotic Note  Kelli Jenkins is a 61 y.o. female with hx substance abuse who presented to the ED on 03/12/2022 with c/o upper abdominal pain and headache.  In the ED, she was found to be febrile and covid test came back positive.  Lumbar puncture attempt was aborted after she was found to have covid. Pharmacy has been consulted to dose vancomycin for sepsis.  Plan: - vancomycin 1000 mg IV x1 now (in addition to the 1000 mg given earlier in the ED to get 2000 mg total for today), then 750 mg IV q12h for est AUC 453 - ceftriaxone 2gm q24h per MD  __________________________________  Height: '5\' 8"'$  (172.7 cm) Weight: 107 kg (236 lb) IBW/kg (Calculated) : 63.9  Temp (24hrs), Avg:100.5 F (38.1 C), Min:99.9 F (37.7 C), Max:101.1 F (38.4 C)  Recent Labs  Lab 03/12/22 0832  WBC 8.9  CREATININE 0.85    Estimated Creatinine Clearance: 87.9 mL/min (by C-G formula based on SCr of 0.85 mg/dL).    Allergies  Allergen Reactions   Darvocet [Propoxyphene N-Acetaminophen] Swelling    OK to take acetaminophen   Ibuprofen Swelling    Ok to take ketorolac   Tramadol Swelling     Thank you for allowing pharmacy to be a part of this patient's care.  Lynelle Doctor 03/12/2022 2:32 PM

## 2022-03-13 LAB — BASIC METABOLIC PANEL
Anion gap: 6 (ref 5–15)
BUN: 14 mg/dL (ref 8–23)
CO2: 23 mmol/L (ref 22–32)
Calcium: 8.3 mg/dL — ABNORMAL LOW (ref 8.9–10.3)
Chloride: 110 mmol/L (ref 98–111)
Creatinine, Ser: 0.81 mg/dL (ref 0.44–1.00)
GFR, Estimated: >60 mL/min (ref >60)
Glucose, Bld: 102 mg/dL — ABNORMAL HIGH (ref 70–99)
Potassium: 3.4 mmol/L — ABNORMAL LOW (ref 3.5–5.1)
Sodium: 139 mmol/L (ref 135–145)

## 2022-03-13 LAB — CBC
HCT: 36.8 % (ref 36.0–46.0)
Hemoglobin: 11.9 g/dL — ABNORMAL LOW (ref 12.0–15.0)
MCH: 31.8 pg (ref 26.0–34.0)
MCHC: 32.3 g/dL (ref 30.0–36.0)
MCV: 98.4 fL (ref 80.0–100.0)
Platelets: 198 10*3/uL (ref 150–400)
RBC: 3.74 MIL/uL — ABNORMAL LOW (ref 3.87–5.11)
RDW: 14.1 % (ref 11.5–15.5)
WBC: 5.2 10*3/uL (ref 4.0–10.5)
nRBC: 0 % (ref 0.0–0.2)

## 2022-03-13 LAB — PROCALCITONIN: Procalcitonin: <0.1 ng/mL

## 2022-03-13 MED ORDER — IPRATROPIUM-ALBUTEROL 0.5-2.5 (3) MG/3ML IN SOLN
3.0000 mL | Freq: Two times a day (BID) | RESPIRATORY_TRACT | Status: DC
  Administered 2022-03-13 – 2022-03-14 (×2): 3 mL via RESPIRATORY_TRACT
  Filled 2022-03-13 (×2): qty 3

## 2022-03-13 MED ORDER — GUAIFENESIN-DM 100-10 MG/5ML PO SYRP
5.0000 mL | ORAL_SOLUTION | ORAL | Status: DC | PRN
  Administered 2022-03-13: 5 mL via ORAL
  Filled 2022-03-13: qty 5

## 2022-03-13 MED ORDER — ALBUTEROL SULFATE (2.5 MG/3ML) 0.083% IN NEBU: 2.5000 mg | INHALATION_SOLUTION | Freq: Four times a day (QID) | RESPIRATORY_TRACT | Status: DC | PRN

## 2022-03-13 MED ORDER — METHYLPREDNISOLONE SODIUM SUCC 125 MG IJ SOLR
80.0000 mg | Freq: Every day | INTRAMUSCULAR | Status: DC
  Administered 2022-03-13: 80 mg via INTRAVENOUS
  Filled 2022-03-13: qty 2

## 2022-03-13 MED ORDER — POTASSIUM CHLORIDE CRYS ER 20 MEQ PO TBCR
40.0000 meq | EXTENDED_RELEASE_TABLET | Freq: Once | ORAL | Status: AC
  Administered 2022-03-13: 40 meq via ORAL
  Filled 2022-03-13: qty 2

## 2022-03-13 MED ORDER — PANTOPRAZOLE SODIUM 40 MG PO TBEC
40.0000 mg | DELAYED_RELEASE_TABLET | Freq: Every day | ORAL | Status: DC
  Administered 2022-03-13 – 2022-03-14 (×2): 40 mg via ORAL
  Filled 2022-03-13 (×2): qty 1

## 2022-03-13 MED ORDER — IPRATROPIUM-ALBUTEROL 0.5-2.5 (3) MG/3ML IN SOLN
3.0000 mL | Freq: Four times a day (QID) | RESPIRATORY_TRACT | Status: DC
  Administered 2022-03-13: 3 mL via RESPIRATORY_TRACT
  Filled 2022-03-13: qty 3

## 2022-03-13 MED ORDER — ALUM & MAG HYDROXIDE-SIMETH 200-200-20 MG/5ML PO SUSP: 30.0000 mL | Freq: Four times a day (QID) | ORAL | Status: DC | PRN

## 2022-03-13 NOTE — Plan of Care (Signed)

## 2022-03-13 NOTE — Progress Notes (Addendum)
    OVERNIGHT PROGRESS REPORT  Notified by RN for request to reassess patient status for possible reassignment of status bed.  Patient is and has been awake and oriented x4, self-care, fully mobile to the bathroom and around room.  No oxygen requirement other than prophylactic while sleeping due to sleep apnea history.   No obvious or stated distress. Only corresponding increase in Resp rate with mobility to 22 breaths per minute +/-, returns to normal limits at rest.   Gershon Cull MSNA MSN [id number]-AG Acute Care Nurse Practitioner [place]

## 2022-03-13 NOTE — Progress Notes (Signed)
PROGRESS NOTE  Kelli Jenkins  WUJ:[id number] DOB: 11/03/1960 DOA: 03/12/2022 PCP: Patient, No Pcp Per   Brief Narrative: Kelli Jenkins is a 61 y.o. female with medical history significant of cocaine abuse, nephrolithiasis, tobacco use who presents from home along with her boyfriend for the evaluation of upper abdominal pain.  Patient was confused on presentation and she is a very poor historian so all information was taken from the boyfriend at bedside.  As per the boyfriend, patient started falling at home.  She fell twice.  She was complaining of body ache all over and also had fever.  Patient smokes cocaine every day.  She was febrile on presentation.  COVID screen test positive.  Chest x-ray did not show any pneumonia.  Started on Paxlovid, added Solu-Medrol today.  On room air   Assessment & Plan:   AMS: Multifactorial secondary to cocaine use, COVID. Marland Kitchen Low suspicion for CNS infection.  Lumbar puncture attempted at bedside but was unsuccessful.  CT head did not show any acute intracranial findings but showed asymmetric hypodensity in the left cerebellum.MRI did not show any acute findings.   ABG did not show any hypercapnia. She was given a dose of Narcan.  Her mental status has improved and she is currently alert and oriented but still looks a little foggy, drowsy. continue to monitor   Fever: No leukocytosis.  Febrile to 101.1 F in the ED.  Likely from [place].  UA not suspicious for UTI.  Started on ceftriaxone, vancomycin in the ED.  Fever could be from COVID.  Chest x-ray did not show any pneumonia.  Procalcitonin negative.  Antibiotics discontinued   COVID-positive:  Saturating fine on room air.    Chest x-ray without finding of any pneumonia.she is wheezing today.  Continue Paxlovid, Solu-Medrol   Tobacco/possible COPD: Not formally diagnosed.  Has wheezing today.  She smokes a pack a day.  Counseled cessation.  Continue steroids  cocaine use: Smokes cocaine daily.   UDS positive.  Counseled for cessation.   Abdominal pain.  Complains of abdominal discomfort.  Abdomen is nontender and nondistended.  CT abdomen/pelvis did not show any acute findings.  Continue Protonix  Hypokalemia: Supplemented with  potassium   Morbid obesity: BMI 35.8        DVT prophylaxis:enoxaparin (LOVENOX) injection 40 mg Start: 03/12/22 2200     Code Status: Full Code  Family Communication: Boyfriend at bedside  Patient status:Inpatient  Patient is from :Home  Anticipated discharge NF:AOZH  Estimated DC date:tomorrow   Consultants: None  Procedures:None  Antimicrobials:  Anti-infectives (From admission, onward)    Start     Dose/Rate Route Frequency Ordered Stop   03/13/22 1000  cefTRIAXone (ROCEPHIN) 2 g in sodium chloride 0.9 % 100 mL IVPB  Status:  Discontinued        2 g 200 mL/hr over 30 Minutes Intravenous  Once 03/12/22 1402 03/12/22 1405   03/13/22 1000  cefTRIAXone (ROCEPHIN) 2 g in sodium chloride 0.9 % 100 mL IVPB  Status:  Discontinued        2 g 200 mL/hr over 30 Minutes Intravenous Daily 03/12/22 1424 03/13/22 1322   03/13/22 0300  vancomycin (VANCOREADY) IVPB 750 mg/150 mL  Status:  Discontinued        750 mg 150 mL/hr over 60 Minutes Intravenous Every 12 hours 03/12/22 1438 03/13/22 1322   03/12/22 2200  nirmatrelvir/ritonavir EUA (PAXLOVID) 3 tablet        3 tablet Oral 2 times daily  03/12/22 1405 03/17/22 2159   03/12/22 1445  vancomycin (VANCOCIN) IVPB 1000 mg/200 mL premix        1,000 mg 200 mL/hr over 60 Minutes Intravenous NOW 03/12/22 1432 03/12/22 1918   03/12/22 1415  cefTRIAXone (ROCEPHIN) 2 g in sodium chloride 0.9 % 100 mL IVPB  Status:  Discontinued        2 g 200 mL/hr over 30 Minutes Intravenous Daily 03/12/22 1405 03/12/22 1424   03/12/22 1130  vancomycin (VANCOCIN) IVPB 1000 mg/200 mL premix        1,000 mg 200 mL/hr over 60 Minutes Intravenous  Once 03/12/22 1128 03/12/22 1354   03/12/22 1130  cefTRIAXone  (ROCEPHIN) 2 g in sodium chloride 0.9 % 100 mL IVPB        2 g 200 mL/hr over 30 Minutes Intravenous  Once 03/12/22 1128 03/12/22 1354       Subjective: Patient seen and examined at bedside today.  Hemodynamically stable.  Afebrile this morning.  Mental status has improved and she communicates well, alert oriented but still foggy/drowsy.  Boyfriend at bedside who uses cocaine with the patient was in deep sleep,snoring. Complains of abdominal discomfort.  No vomiting or nausea.  On room air   Objective: Vitals:   03/13/22 0121 03/13/22 0512 03/13/22 0854 03/13/22 1216  BP: 94/65 106/66 (!) 141/66 119/74  Pulse: 76 74 88 80  Resp: '18 20 20 19  '$ Temp: 97.9 F (36.6 C) 98 F (36.7 C) 99.3 F (37.4 C) 97.8 F (36.6 C)  TempSrc: Oral  Oral Oral  SpO2: 97% 100% 95% 94%  Weight:      Height:        Intake/Output Summary (Last 24 hours) at 03/13/2022 1322 Last data filed at 03/13/2022 0900 Gross per 24 hour  Intake 3404.59 ml  Output --  Net 3404.59 ml   Filed Weights   03/12/22 0654 03/12/22 2030  Weight: 107 kg 107.7 kg    Examination:  General exam: obese, overall comfortable HEENT: PERRL Respiratory system: Bilateral expiratory wheezing cardiovascular system: S1 & S2 heard, RRR.  Gastrointestinal system: Abdomen is nondistended, soft and nontender. Central nervous system: Alert and oriented Extremities: No edema, no clubbing ,no cyanosis Skin: No rashes, no ulcers,no icterus     Data Reviewed: I have personally reviewed following labs and imaging studies  CBC: Recent Labs  Lab 03/12/22 0832 03/13/22 0541  WBC 8.9 5.2  HGB 12.4 11.9*  HCT 38.1 36.8  MCV 96.5 98.4  PLT 234 938   Basic Metabolic Panel: Recent Labs  Lab 03/12/22 0832 03/13/22 0541  NA 136 139  K 3.9 3.4*  CL 103 110  CO2 24 23  GLUCOSE 118* 102*  BUN 18 14  CREATININE 0.85 0.81  CALCIUM 8.6* 8.3*     Recent Results (from the past 240 hour(s))  SARS Coronavirus 2 by RT PCR  (hospital order, performed in [facility] hospital lab) *cepheid single result test* Anterior Nasal Swab     Status: Abnormal   Collection Time: 03/12/22  8:40 AM   Specimen: Anterior Nasal Swab  Result Value Ref Range Status   SARS Coronavirus 2 by RT PCR POSITIVE (A) NEGATIVE Final    Comment: (NOTE) SARS-CoV-2 target nucleic acids are DETECTED  SARS-CoV-2 RNA is generally detectable in upper respiratory specimens  during the acute phase of infection.  Positive results are indicative  of the presence of the identified virus, but do not rule out bacterial infection or co-infection with other  pathogens not detected by the test.  Clinical correlation with patient history and  other diagnostic information is necessary to determine patient infection status.  The expected result is negative.  Fact Sheet for Patients:   https://www.patel.info/   Fact Sheet for Healthcare Providers:   https://hall.com/    This test is not yet approved or cleared by the [place] FDA and  has been authorized for detection and/or diagnosis of SARS-CoV-2 by FDA under an Emergency Use Authorization (EUA).  This EUA will remain in effect (meaning this test can be used) for the duration of  the COVID-19 declaration under Section 564(b)(1)  of the Act, 21 U.S.C. section 360-bbb-3(b)(1), unless the authorization is terminated or revoked sooner.   Performed at [facility], [place] [address], [place], [place] [address]   Culture, blood (Routine X 2) w Reflex to ID Panel     Status: None (Preliminary result)   Collection Time: 03/12/22  4:41 PM   Specimen: BLOOD  Result Value Ref Range Status   Specimen Description   Final    BLOOD RIGHT ANTECUBITAL Performed at [place] [address], [place], [place] [address]    Special Requests   Final    BOTTLES DRAWN AEROBIC AND ANAEROBIC Blood Culture adequate volume Performed at  [place] [address], [place], [place] [address]    Culture   Final    NO GROWTH < 12 HOURS Performed at [place] [address] [place], [place] [address]    Report Status PENDING  Incomplete  MRSA Next Gen by PCR, Nasal     Status: None   Collection Time: 03/12/22  8:34 PM   Specimen: Nasal Mucosa; Nasal Swab  Result Value Ref Range Status   MRSA by PCR Next Gen NOT DETECTED NOT DETECTED Final    Comment: (NOTE) The GeneXpert MRSA Assay (FDA approved for NASAL specimens only), is one component of a comprehensive MRSA colonization surveillance program. It is not intended to diagnose MRSA infection nor to guide or monitor treatment for MRSA infections. Test performance is not FDA approved in patients less than 80 years old. Performed at [facility], [place] [address], [place], [place] [address]   Culture, blood (Routine X 2) w Reflex to ID Panel     Status: None (Preliminary result)   Collection Time: 03/12/22  9:00 PM   Specimen: BLOOD  Result Value Ref Range Status   Specimen Description   Final    BLOOD RIGHT ANTECUBITAL Performed at [place] [address] [place], [place] [address]    Special Requests   Final    BOTTLES DRAWN AEROBIC AND ANAEROBIC Blood Culture adequate volume Performed at [place] [address] [place], [place] [address]    Culture   Final    NO GROWTH < 12 HOURS Performed at [place] [address] [place], [place] [address]    Report Status PENDING  Incomplete     Radiology Studies: MR BRAIN WO CONTRAST  Result Date: 03/13/2022 CLINICAL DATA:  Delirium EXAM: MRI HEAD WITHOUT CONTRAST TECHNIQUE: Multiplanar, multiecho pulse sequences of the brain and surrounding structures were obtained without intravenous contrast. COMPARISON:  No prior MRI available, correlation is made with CT head 03/12/2022 FINDINGS: Brain: No restricted  diffusion to suggest acute or subacute infarct. No acute hemorrhage, mass, mass effect, or midline shift. No hydrocephalus or extra-axial collection. No hemosiderin deposition  to suggest remote hemorrhage. Scattered T2 hyperintense signal in the periventricular white matter and pons, likely the sequela of mild-to-moderate chronic small vessel ischemic disease. Vascular: Normal arterial flow voids. Skull and upper cervical spine: Normal marrow signal. Sinuses/Orbits: Mild mucosal thickening in the paranasal sinuses. The orbits are unremarkable. Other: Fluid in left mastoid air cells. IMPRESSION: No acute intracranial process. No evidence of acute or subacute infarct. Electronically Signed   By: Merilyn Baba M.D.   On: 03/13/2022 00:19   CT Head Wo Contrast  Result Date: 03/12/2022 CLINICAL DATA:  Altered mental status EXAM: CT HEAD WITHOUT CONTRAST TECHNIQUE: Contiguous axial images were obtained from the base of the skull through the vertex without intravenous contrast. RADIATION DOSE REDUCTION: This exam was performed according to the departmental dose-optimization program which includes automated exposure control, adjustment of the mA and/or kV according to patient size and/or use of iterative reconstruction technique. COMPARISON:  CT Brain 10/31/20 FINDINGS: Brain: No evidence of acute infarction, hemorrhage, hydrocephalus, extra-axial collection or mass lesion/mass effect. Asymmetric hypodensity in the left cerebellum, nonspecific, and possibly artifactual. Vascular: No hyperdense vessel or unexpected calcification. Skull: Negative for fracture. Focal hypodense lesion in the left frontal calvarium, nonspecific and unchanged from 2022 Sinuses/Orbits: No acute finding. Other: Asymmetric mucosal thickening in the right nasal cavity. IMPRESSION: 1. Apparent asymmetric hypodensity in the left cerebellum may be artifactual, but is new compared to 2022. If there is clinical concern for an infarct, further  evaluation with a brain MRI is recommended. 2. Asymmetric mucosal thickening in the right nasal cavity. Electronically Signed   By: Marin Roberts M.D.   On: 03/12/2022 11:01   CT ABDOMEN PELVIS W CONTRAST  Result Date: 03/12/2022 CLINICAL DATA:  Abdominal pain. EXAM: CT ABDOMEN AND PELVIS WITH CONTRAST TECHNIQUE: Multidetector CT imaging of the abdomen and pelvis was performed using the standard protocol following bolus administration of intravenous contrast. RADIATION DOSE REDUCTION: This exam was performed according to the departmental dose-optimization program which includes automated exposure control, adjustment of the mA and/or kV according to patient size and/or use of iterative reconstruction technique. CONTRAST:  149m OMNIPAQUE IOHEXOL 300 MG/ML  SOLN COMPARISON:  01/10/2022 FINDINGS: Lower chest: No acute abnormality. Hepatobiliary: Diffuse hepatic steatosis identified. No focal liver abnormality. Gallbladder normal. No bile duct dilatation. Pancreas: Unremarkable. No pancreatic ductal dilatation or surrounding inflammatory changes. Spleen: Normal in size without focal abnormality. Adrenals/Urinary Tract: Normal adrenal glands. Cortical calcification within the lateral cortex of the interpolar right kidney measures 5 mm. 1 cm cyst noted within the inferior pole of the left kidney. No follow-up imaging recommended. No suspicious mass or hydronephrosis. No signs of hydronephrosis or hydroureter. Urinary bladder is unremarkable. Stomach/Bowel: Stomach appears normal. The appendix is visualized and is within normal limits. No bowel wall thickening, inflammation, or distension. Vascular/Lymphatic: Aortic atherosclerosis. No aneurysm. No signs of abdominopelvic adenopathy. Reproductive: Status post hysterectomy. No adnexal masses. Other: No free fluid or fluid collections identified. No signs of pneumoperitoneum. 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 No acute  or suspicious findings. IMPRESSION: 1. No acute findings within the abdomen or pelvis. 2. Hepatic steatosis. 3. Cortical calcification within the lateral cortex of the interpolar right kidney measures 5 mm. No signs of hydronephrosis or hydroureter. 4.  Aortic Atherosclerosis (ICD10-I70.0). Electronically Signed   By: TKerby MoorsM.D.   On: 03/12/2022 10:55   DG Chest Portable 1 View  Result Date: 03/12/2022 CLINICAL DATA:  Fever EXAM: PORTABLE CHEST 1 VIEW COMPARISON:  Chest  x-ray dated June 25, 2020 FINDINGS: The heart size and mediastinal contours are within normal limits. Both lungs are clear. The visualized skeletal structures are unremarkable. IMPRESSION: No active disease. Electronically Signed   By: Yetta Glassman M.D.   On: 03/12/2022 08:57    Scheduled Meds:  Chlorhexidine Gluconate Cloth  6 each Topical Daily   enoxaparin (LOVENOX) injection  40 mg Subcutaneous Q24H   ipratropium-albuterol  3 mL Nebulization Q6H   methylPREDNISolone (SOLU-MEDROL) injection  80 mg Intravenous Daily   nirmatrelvir/ritonavir EUA  3 tablet Oral BID   pantoprazole  40 mg Oral Daily   Continuous Infusions:  sodium chloride 125 mL/hr at 03/13/22 0917     LOS: 1 day   Shelly Coss, MD [facility] P11/21/2023, 1:22 PM

## 2022-03-13 NOTE — TOC Initial Note (Signed)
Transition of Care [facility]) - Initial/Assessment Note    Patient Details  Name: Kelli Jenkins MRN: [id number] Date of Birth: 1960-10-24  Transition of [facility]) CM/SW Contact:    Vassie Moselle, LCSW Phone Number: 03/13/2022, 11:53 AM  Clinical Narrative:                 Met with pt who shares she is currently homeless and has been living in her car. Pt is interested in emergency housing and requested a hotel room at discharge. CSW shared that the hospital is unable to place people into housing however, resources and referrals to community programs could be made. Pt agreeable to referrals being placed. Pt reports she does not have a PCP however, is agreeable to having an appointment scheduled.  Pt stopped talking to CSW once resources for substance use were brought up however, pt did not decline having resources placed on discharge paperwork.   Resources for substance use, food, shelters, and financial assistance have been placed on pt's AVS. Referrals have been made through [facility] for additional resources.  A PCP appt has been made for 12/5 at 1:30pm at [facility].   Expected Discharge Plan: Home/Self Care Barriers to Discharge: No Barriers Identified   Patient Goals and CMS Choice Patient states their goals for this hospitalization and ongoing recovery are:: To get housing CMS Medicare.gov Compare Post Acute Care list provided to:: Patient Choice offered to / list presented to : Patient  Expected Discharge Plan and Services Expected Discharge Plan: Home/Self Care In-house Referral: NA Discharge Planning Services: [place] Acute Care Choice: NA Living arrangements for the past 2 months: Homeless (Living in car)                 DME Arranged: N/A DME Agency: NA                  Prior Living Arrangements/Services Living arrangements for the past 2 months: Homeless (Living in car) Lives with:: Significant Other Patient  language and need for interpreter reviewed:: Yes Do you feel safe going back to the place where you live?: Yes      Need for Family Participation in Patient Care: No (Comment) Care giver support system in place?: No (comment)   Criminal Activity/Legal Involvement Pertinent to Current Situation/Hospitalization: No - Comment as needed  Activities of Daily Living Home Assistive Devices/Equipment: Eyeglasses (per previous hx) ADL Screening (condition at time of admission) Patient's cognitive ability adequate to safely complete daily activities?: No Is the patient deaf or have difficulty hearing?: No Does the patient have difficulty seeing, even when wearing glasses/contacts?: No Does the patient have difficulty concentrating, remembering, or making decisions?: No Patient able to express need for assistance with ADLs?: No Does the patient have difficulty dressing or bathing?: No Independently performs ADLs?: No Communication: Independent Dressing (OT): Needs assistance Is this a change from baseline?: Change from baseline, expected to last >3 days Grooming: Needs assistance Is this a change from baseline?: Change from baseline, expected to last >3 days Feeding: Independent Bathing: Needs assistance Is this a change from baseline?: Change from baseline, expected to last >3 days Toileting: Needs assistance Is this a change from baseline?: Change from baseline, expected to last >3days In/Out Bed: Needs assistance Is this a change from baseline?: Change from baseline, expected to last >3 days Walks in Home: Needs assistance Is this a change from baseline?: Change from baseline, expected to last >3 days Does the  patient have difficulty walking or climbing stairs?: No Weakness of Legs: Both Weakness of Arms/Hands: Both  Permission Sought/Granted Permission sought to share information with : PCP Permission granted to share information with : Yes, Verbal Permission Granted     Permission  granted to share info w AGENCY: PCP        Emotional Assessment Appearance:: Appears stated age Attitude/Demeanor/Rapport: Avoidant Affect (typically observed): Depressed Orientation: : Oriented to Self, Oriented to Place, Oriented to  Time, Oriented to Situation Alcohol / Substance Use: Illicit Drugs Psych Involvement: No (comment)  Admission diagnosis:  Cocaine intoxication delirium ([place]) [F14.921] Altered mental status, unspecified altered mental status type [R41.82] AMS (altered mental status) [R41.82] COVID-19 viremia [U07.1] Patient Active Problem List   Diagnosis Date Noted   AMS (altered mental status) 03/12/2022   Cocaine use 03/12/2022   COVID-19 virus infection 03/12/2022   Cellulitis 01/22/2020   Severe sepsis ([place]) 01/22/2020   Homelessness 01/22/2020   Chest pain 06/08/2016   Nausea & vomiting 06/08/2016   Intractable vomiting with nausea    CAP (community acquired pneumonia) 05/03/2016   Normocytic anemia 05/03/2016   Urinary tract infection without hematuria    Opioid overdose ([place]) 01/21/2016   Somnolence 01/21/2016   Cocaine abuse ([place]) 01/21/2016   Opioid abuse ([place]) 01/21/2016   UTI (urinary tract infection) 07/13/2013   HCAP (healthcare-associated pneumonia) 08/16/2011   Migraine 08/15/2011   SIRS (systemic inflammatory response syndrome) ([place]) 08/13/2011   Pyelonephritis 00/45/9977   Ureteral colic 41/42/3953   Hypotension 08/12/2011   Fever 08/12/2011   Nephrolithiasis 08/12/2011   Leukocytosis 08/12/2011   Tobacco use disorder 08/12/2011   PCP:  Patient, No Pcp Per Pharmacy:   [place] [address] Phone: [phone] Fax: [phone]  [id number] [place], [place] - [address] AT [place] [address] [place] [address] Phone: [phone] Fax: [phone]     Social Determinants of Health (SDOH)  Interventions Food Insecurity Interventions: [id number] Referral Housing Interventions: [id number] Referral  Readmission Risk Interventions    03/13/2022   11:45 AM  Readmission Risk Prevention Plan  Transportation Screening Complete  PCP or Specialist Appt within 5-7 Days Complete  Home Care Screening Complete  Medication Review (RN CM) Complete

## 2022-03-14 ENCOUNTER — Other Ambulatory Visit: Payer: Self-pay

## 2022-03-14 ENCOUNTER — Other Ambulatory Visit (HOSPITAL_COMMUNITY): Payer: Self-pay

## 2022-03-14 DIAGNOSIS — F1410 Cocaine abuse, uncomplicated: Secondary | ICD-10-CM

## 2022-03-14 DIAGNOSIS — U071 COVID-19: Secondary | ICD-10-CM

## 2022-03-14 DIAGNOSIS — Y92009 Unspecified place in unspecified non-institutional (private) residence as the place of occurrence of the external cause: Secondary | ICD-10-CM

## 2022-03-14 DIAGNOSIS — G9341 Metabolic encephalopathy: Secondary | ICD-10-CM

## 2022-03-14 DIAGNOSIS — W19XXXA Unspecified fall, initial encounter: Secondary | ICD-10-CM

## 2022-03-14 DIAGNOSIS — E669 Obesity, unspecified: Secondary | ICD-10-CM

## 2022-03-14 LAB — HEMOGLOBIN A1C
Hgb A1c MFr Bld: 6 % — ABNORMAL HIGH (ref 4.8–5.6)
Mean Plasma Glucose: 125.5 mg/dL

## 2022-03-14 LAB — BASIC METABOLIC PANEL
Anion gap: 8 (ref 5–15)
BUN: 14 mg/dL (ref 8–23)
CO2: 22 mmol/L (ref 22–32)
Calcium: 9.3 mg/dL (ref 8.9–10.3)
Chloride: 109 mmol/L (ref 98–111)
Creatinine, Ser: 0.66 mg/dL (ref 0.44–1.00)
GFR, Estimated: >60 mL/min (ref >60)
Glucose, Bld: 241 mg/dL — ABNORMAL HIGH (ref 70–99)
Potassium: 4.5 mmol/L (ref 3.5–5.1)
Sodium: 139 mmol/L (ref 135–145)

## 2022-03-14 LAB — CBC
HCT: 42.3 % (ref 36.0–46.0)
Hemoglobin: 13.8 g/dL (ref 12.0–15.0)
MCH: 31.6 pg (ref 26.0–34.0)
MCHC: 32.6 g/dL (ref 30.0–36.0)
MCV: 96.8 fL (ref 80.0–100.0)
Platelets: 246 10*3/uL (ref 150–400)
RBC: 4.37 MIL/uL (ref 3.87–5.11)
RDW: 13.4 % (ref 11.5–15.5)
WBC: 11.7 10*3/uL — ABNORMAL HIGH (ref 4.0–10.5)
nRBC: 0 % (ref 0.0–0.2)

## 2022-03-14 LAB — GLUCOSE, CAPILLARY
Glucose-Capillary: 244 mg/dL — ABNORMAL HIGH (ref 70–99)
Glucose-Capillary: 254 mg/dL — ABNORMAL HIGH (ref 70–99)

## 2022-03-14 MED ORDER — ALBUTEROL SULFATE HFA 108 (90 BASE) MCG/ACT IN AERS
2.0000 | INHALATION_SPRAY | Freq: Four times a day (QID) | RESPIRATORY_TRACT | 2 refills | Status: AC | PRN
  Filled 2022-03-14: qty 6.7, 25d supply, fill #0

## 2022-03-14 MED ORDER — INSULIN ASPART 100 UNIT/ML IJ SOLN
0.0000 [IU] | Freq: Three times a day (TID) | INTRAMUSCULAR | Status: DC
  Administered 2022-03-14: 3 [IU] via SUBCUTANEOUS
  Administered 2022-03-14: 5 [IU] via SUBCUTANEOUS

## 2022-03-14 MED ORDER — ENOXAPARIN SODIUM 60 MG/0.6ML IJ SOSY: 55.0000 mg | PREFILLED_SYRINGE | INTRAMUSCULAR | Status: DC

## 2022-03-14 MED ORDER — METHYLPREDNISOLONE SODIUM SUCC 40 MG IJ SOLR
40.0000 mg | Freq: Every day | INTRAMUSCULAR | Status: DC
  Administered 2022-03-14: 40 mg via INTRAVENOUS
  Filled 2022-03-14: qty 1

## 2022-03-14 MED ORDER — NIRMATRELVIR/RITONAVIR (PAXLOVID)TABLET
3.0000 | ORAL_TABLET | Freq: Two times a day (BID) | ORAL | 0 refills | Status: AC
  Filled 2022-03-14 (×2): qty 30, 5d supply, fill #0

## 2022-03-14 MED ORDER — ONDANSETRON HCL 4 MG/2ML IJ SOLN
4.0000 mg | Freq: Once | INTRAMUSCULAR | Status: AC
  Administered 2022-03-14: 4 mg via INTRAVENOUS
  Filled 2022-03-14: qty 2

## 2022-03-14 MED ORDER — PANTOPRAZOLE SODIUM 40 MG PO TBEC
40.0000 mg | DELAYED_RELEASE_TABLET | Freq: Every day | ORAL | 0 refills | Status: AC
  Filled 2022-03-14: qty 30, 30d supply, fill #0

## 2022-03-14 NOTE — Progress Notes (Signed)
PT Cancellation Note  Patient Details Name: Kelli Jenkins MRN: [id number] DOB: [birth year]   Cancelled Treatment:    Reason Eval/Treat Not Completed: PT screened, no needs identified, will sign off; Independent  per RN    [facility] 03/14/2022, 12:15 PM

## 2022-03-14 NOTE — TOC Transition Note (Addendum)
Transition of Care [facility]) - CM/SW Discharge Note   Patient Details  Name: Kelli Jenkins MRN: [id number] Date of Birth: 1960/11/10  [facility] CM/SW Contact:  Vassie Moselle, LCSW Phone Number: 03/14/2022, 11:04 AM   Clinical Narrative:    TOC notified pt does not have transportation at discharge. Bus pass provided for pt. RN notified.   Pt unable to afford medications at discharge. [place] letter given for pt to use at pharmacy. Pt aware of copay amounts and aware medications must be picked up within 7 days.    Final next level of care: Home/Self Care Barriers to Discharge: No Barriers Identified   Patient Goals and CMS Choice Patient states their goals for this hospitalization and ongoing recovery are:: To get housing CMS Medicare.gov Compare Post Acute Care list provided to:: Patient Choice offered to / list presented to : Patient  Discharge Placement                       Discharge Plan and Services In-house Referral: NA Discharge Planning Services: [facility] Post Acute Care Choice: NA          DME Arranged: N/A DME Agency: NA                  Social Determinants of Health (SDOH) Interventions Food Insecurity Interventions: [id number] Referral Housing Interventions: [id number] Referral   Readmission Risk Interventions    03/13/2022   11:45 AM  Readmission Risk Prevention Plan  Transportation Screening Complete  PCP or Specialist Appt within 5-7 Days Complete  Home Care Screening Complete  Medication Review (RN CM) Complete

## 2022-03-14 NOTE — Progress Notes (Signed)
Patient discharging to car. Vital signs stable at time of discharge as reflected in discharge summary. Discharge instructions given and verbal understanding returned. No questions at this time. Waiting on TOC medication delivery.

## 2022-03-14 NOTE — Progress Notes (Signed)
OT Cancellation Note  Patient Details Name: Kelli Jenkins MRN: [id number] DOB: [birth year]   Cancelled Treatment:    Reason Eval/Treat Not Completed: OT screened, no needs identified, will sign off. Patient is independent with ADLs and ambulation in room per nursing. Patient has no apparent OT needs.   Nisa Decaire L Bryanne Riquelme 03/14/2022, 11:24 AM

## 2022-03-14 NOTE — Discharge Summary (Signed)
Physician Discharge Summary  Kelli Jenkins IAX:[id number] DOB: 09-21-1960 DOA: 03/12/2022  PCP: Patient, No Pcp Per  Admit date: 03/12/2022 Discharge date: 03/14/2022 Admitted From: Home Disposition: Home Recommendations for Outpatient Follow-up:  Follow up with PCP in 1 to 2 weeks Check BMP and CBC in 1 to 2 weeks Please follow up on the following pending results: None  Home Health: Not indicated Equipment/Devices: Not indicated  Discharge Condition: Stable CODE STATUS: Full code  Follow-up [facility] Follow up.   Why: You have an appointment to establish primary care services on 03/27/22 at 1:30pm. Please arrive 15 minutes early to complete new patient paperwork. Contact information: [facility] 61 year old F with PMH of cocaine abuse, nephrolithiasis and tobacco use disorder presenting with upper abdominal pain, fall x2, myalgia and fever, and admitted for altered mental status.  She tested positive for COVID-19.  Chest x-ray without acute finding.  She was also positive for cocaine.  CT head and MRI brain without acute finding.  Blood cultures negative.  She was started on IV Solu-Medrol on Paxlovid.  No respiratory distress other than brief oxygen requirement.   On the day of discharge, patient's symptoms improved.  She was ambulated on room air and maintain saturation above 97%.  Screened by PT and OT, and no need was identified.  She is discharged on p.o. Paxlovid for 3 more days.  She was asked to refrain from cocaine use.  She was provided with resources for substance use and homelessness.  See individual problem list below for more.   Problems addressed during this hospitalization Principal Problem:   AMS (altered mental status) Active Problems:   Tobacco use disorder   Cocaine use   COVID-19 virus infection   Fall at home,  initial encounter              Vital signs Vitals:   03/14/22 0408 03/14/22 0411 03/14/22 0751 03/14/22 1356  BP: (!) 121/46   (!) 131/56  Pulse: 81   73  Temp: 97.9 F (36.6 C)   97.9 F (36.6 C)  Resp: 20   20  Height:      Weight:      SpO2: 90% 93% 95% 91%  TempSrc: Oral   Oral  BMI (Calculated):         Discharge exam  GENERAL: No apparent distress.  Nontoxic. HEENT: MMM.  Vision and hearing grossly intact.  NECK: Supple.  No apparent JVD.  RESP:  No IWOB.  Fair aeration bilaterally. CVS:  RRR. Heart sounds normal.  ABD/GI/GU: BS+. Abd soft, NTND.  MSK/EXT:  Moves extremities. No apparent deformity. No edema.  SKIN: no apparent skin lesion or wound NEURO: Awake and alert. Oriented appropriately.  No apparent focal neuro deficit. PSYCH: Calm. Normal affect.   Discharge Instructions Discharge Instructions     Call MD for:  difficulty breathing, headache or visual disturbances   Complete by: As directed    Call MD for:  extreme fatigue   Complete by: As directed    Diet general   Complete by: As directed    Discharge instructions   Complete by: As directed    It has been a pleasure taking care of you!  You were hospitalized due to COVID-19 infection for which you have been treated.  Your symptoms  improved.  We are discharging you on Paxlovid (COVID medication) for 3 more days to complete treatment course.  Your urine drug screen is positive for cocaine.  We strongly encourage you to refrain from using cocaine.  Follow-up with your primary care doctor in 1 to 2 weeks or sooner if needed.   Take care,   Increase activity slowly   Complete by: As directed       Allergies as of 03/14/2022       Reactions   Darvocet [propoxyphene N-acetaminophen] Swelling   OK to take acetaminophen   Ibuprofen Swelling   Ok to take ketorolac   Tramadol Swelling        Medication List     STOP taking these medications    omeprazole 20 MG capsule Commonly  known as: PRILOSEC       TAKE these medications    albuterol 108 (90 Base) MCG/ACT inhaler Commonly known as: VENTOLIN HFA Inhale 2 puffs into the lungs every 6 (six) hours as needed for wheezing or shortness of breath.   escitalopram 5 MG tablet Commonly known as: LEXAPRO Take 1 tablet (5 mg total) by mouth daily.   gabapentin 100 MG capsule Commonly known as: NEURONTIN Take 1 capsule (100 mg total) by mouth 3 (three) times daily.   nirmatrelvir/ritonavir EUA 20 x 150 MG & 10 x '100MG'$  Tabs Commonly known as: PAXLOVID Take 3 tablets by mouth 2 (two) times daily for 3 days.   pantoprazole 40 MG tablet Commonly known as: Protonix Take 1 tablet (40 mg total) by mouth daily.        Consultations: None  Procedures/Studies:   MR BRAIN WO CONTRAST  Result Date: 03/13/2022 CLINICAL DATA:  Delirium EXAM: MRI HEAD WITHOUT CONTRAST TECHNIQUE: Multiplanar, multiecho pulse sequences of the brain and surrounding structures were obtained without intravenous contrast. COMPARISON:  No prior MRI available, correlation is made with CT head 03/12/2022 FINDINGS: Brain: No restricted diffusion to suggest acute or subacute infarct. No acute hemorrhage, mass, mass effect, or midline shift. No hydrocephalus or extra-axial collection. No hemosiderin deposition to suggest remote hemorrhage. Scattered T2 hyperintense signal in the periventricular white matter and pons, likely the sequela of mild-to-moderate chronic small vessel ischemic disease. Vascular: Normal arterial flow voids. Skull and upper cervical spine: Normal marrow signal. Sinuses/Orbits: Mild mucosal thickening in the paranasal sinuses. The orbits are unremarkable. Other: Fluid in left mastoid air cells. IMPRESSION: No acute intracranial process. No evidence of acute or subacute infarct. Electronically Signed   By: Merilyn Baba M.D.   On: 03/13/2022 00:19   CT Head Wo Contrast  Result Date: 03/12/2022 CLINICAL DATA:  Altered mental  status EXAM: CT HEAD WITHOUT CONTRAST TECHNIQUE: Contiguous axial images were obtained from the base of the skull through the vertex without intravenous contrast. RADIATION DOSE REDUCTION: This exam was performed according to the departmental dose-optimization program which includes automated exposure control, adjustment of the mA and/or kV according to patient size and/or use of iterative reconstruction technique. COMPARISON:  CT Brain 10/31/20 FINDINGS: Brain: No evidence of acute infarction, hemorrhage, hydrocephalus, extra-axial collection or mass lesion/mass effect. Asymmetric hypodensity in the left cerebellum, nonspecific, and possibly artifactual. Vascular: No hyperdense vessel or unexpected calcification. Skull: Negative for fracture. Focal hypodense lesion in the left frontal calvarium, nonspecific and unchanged from 2022 Sinuses/Orbits: No acute finding. Other: Asymmetric mucosal thickening in the right nasal cavity. IMPRESSION: 1. Apparent asymmetric hypodensity in the left cerebellum may be artifactual, but is new compared to 2022. If  there is clinical concern for an infarct, further evaluation with a brain MRI is recommended. 2. Asymmetric mucosal thickening in the right nasal cavity. Electronically Signed   By: Marin Roberts M.D.   On: 03/12/2022 11:01   CT ABDOMEN PELVIS W CONTRAST  Result Date: 03/12/2022 CLINICAL DATA:  Abdominal pain. EXAM: CT ABDOMEN AND PELVIS WITH CONTRAST TECHNIQUE: Multidetector CT imaging of the abdomen and pelvis was performed using the standard protocol following bolus administration of intravenous contrast. RADIATION DOSE REDUCTION: This exam was performed according to the departmental dose-optimization program which includes automated exposure control, adjustment of the mA and/or kV according to patient size and/or use of iterative reconstruction technique. CONTRAST:  154m OMNIPAQUE IOHEXOL 300 MG/ML  SOLN COMPARISON:  01/10/2022 FINDINGS: Lower chest: No acute  abnormality. Hepatobiliary: Diffuse hepatic steatosis identified. No focal liver abnormality. Gallbladder normal. No bile duct dilatation. Pancreas: Unremarkable. No pancreatic ductal dilatation or surrounding inflammatory changes. Spleen: Normal in size without focal abnormality. Adrenals/Urinary Tract: Normal adrenal glands. Cortical calcification within the lateral cortex of the interpolar right kidney measures 5 mm. 1 cm cyst noted within the inferior pole of the left kidney. No follow-up imaging recommended. No suspicious mass or hydronephrosis. No signs of hydronephrosis or hydroureter. Urinary bladder is unremarkable. Stomach/Bowel: Stomach appears normal. The appendix is visualized and is within normal limits. No bowel wall thickening, inflammation, or distension. Vascular/Lymphatic: Aortic atherosclerosis. No aneurysm. No signs of abdominopelvic adenopathy. Reproductive: Status post hysterectomy. No adnexal masses. Other: No free fluid or fluid collections identified. No signs of pneumoperitoneum. 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 No acute or suspicious findings. IMPRESSION: 1. No acute findings within the abdomen or pelvis. 2. Hepatic steatosis. 3. Cortical calcification within the lateral cortex of the interpolar right kidney measures 5 mm. No signs of hydronephrosis or hydroureter. 4.  Aortic Atherosclerosis (ICD10-I70.0). Electronically Signed   By: TKerby MoorsM.D.   On: 03/12/2022 10:55   DG Chest Portable 1 View  Result Date: 03/12/2022 CLINICAL DATA:  Fever EXAM: PORTABLE CHEST 1 VIEW COMPARISON:  Chest x-ray dated June 25, 2020 FINDINGS: The heart size and mediastinal contours are within normal limits. Both lungs are clear. The visualized skeletal structures are unremarkable. IMPRESSION: No active disease. Electronically Signed   By: LYetta GlassmanM.D.   On: 03/12/2022 08:57       The results of significant diagnostics from this  hospitalization (including imaging, microbiology, ancillary and laboratory) are listed below for reference.     Microbiology: Recent Results (from the past 240 hour(s))  SARS Coronavirus 2 by RT PCR (hospital order[facility]) *cepheid single result test* Anterior Nasal Swab     Status: Abnormal   Collection Time: 03/12/22  8:40 AM   Specimen: Anterior Nasal Swab  Result Value Ref Range Status   SARS Coronavirus 2 by RT PCR POSITIVE (A) NEGATIVE Final    Comment: (NOTE) SARS-CoV-2 target nucleic acids are DETECTED  SARS-CoV-2 RNA is generally detectable in upper respiratory specimens  during the acute phase of infection.  Positive results are indicative  of the presence of the identified virus, but do not rule out bacterial infection or co-infection with other pathogens not detected by the test.  Clinical correlation with patient history and  other diagnostic information is necessary to determine patient infection status.  The expected result is negative.  Fact Sheet for Patients:   hhttps://www.patel.info/  Fact Sheet for Healthcare Providers:   hhttps://hall.com/  This test is not yet approved or cleared by the [place] and  has been authorized for detection and/or diagnosis of SARS-CoV-2 by FDA under an Emergency Use Authorization (EUA).  This EUA will remain in effect (meaning this test can be used) for the duration of  the COVID-19 declaration under Section 564(b)(1)  of the Act, 21 U.S.C. section 360-bbb-3(b)(1), unless the authorization is terminated or revoked sooner.   Performed at [facility], [place] [address] [place], [place] [address]   Culture, blood (Routine X 2) w Reflex to ID Panel     Status: None (Preliminary result)   Collection Time: 03/12/22  4:41 PM   Specimen: BLOOD  Result Value Ref Range Status   Specimen Description   Final    BLOOD RIGHT  ANTECUBITAL Performed at [facility] [address] [place], [place] [address]    Special Requests   Final    BOTTLES DRAWN AEROBIC AND ANAEROBIC Blood Culture adequate volume Performed at [facility] [address] [place], [place] [address]    Culture   Final    NO GROWTH 2 DAYS Performed at [facility] [address], [place], [place] [address]    Report Status PENDING  Incomplete  MRSA Next Gen by PCR, Nasal     Status: None   Collection Time: 03/12/22  8:34 PM   Specimen: Nasal Mucosa; Nasal Swab  Result Value Ref Range Status   MRSA by PCR Next Gen NOT DETECTED NOT DETECTED Final    Comment: (NOTE) The GeneXpert MRSA Assay (FDA approved for NASAL specimens only), is one component of a comprehensive MRSA colonization surveillance program. It is not intended to diagnose MRSA infection nor to guide or monitor treatment for MRSA infections. Test performance is not FDA approved in patients less than 47 years old. Performed at [facility], [place] [address] [place], [place] [address]   Culture, blood (Routine X 2) w Reflex to ID Panel     Status: None (Preliminary result)   Collection Time: 03/12/22  9:00 PM   Specimen: BLOOD  Result Value Ref Range Status   Specimen Description   Final    BLOOD RIGHT ANTECUBITAL Performed at [facility] [address], [place], [place] [address]    Special Requests   Final    BOTTLES DRAWN AEROBIC AND ANAEROBIC Blood Culture adequate volume Performed at [facility] [address] [place],  [address]    Culture   Final    NO GROWTH 1 DAY Performed at [facility], [place] [address] [place],  [address]    Report Status PENDING  Incomplete     Labs:  CBC: Recent Labs  Lab 03/12/22 0832 03/13/22 0541 03/14/22 0514  WBC 8.9 5.2 11.7*  HGB 12.4 11.9* 13.8  HCT 38.1 36.8 42.3  MCV 96.5 98.4 96.8  PLT 234  198 246   BMP &GFR Recent Labs  Lab 03/12/22 0832 03/13/22 0541 03/14/22 0514  NA 136 139 139  K 3.9 3.4* 4.5  CL 103 110 109  CO2 '24 23 22  '$ GLUCOSE 118* 102* 241*  BUN '18 14 14  '$ CREATININE 0.85 0.81 0.66  CALCIUM 8.6* 8.3* 9.3   Estimated Creatinine Clearance: 93.7 mL/min (by C-G formula based on SCr of 0.66 mg/dL). Liver & Pancreas: Recent Labs  Lab 03/12/22 0832  AST 26  ALT 22  ALKPHOS 67  BILITOT 0.3  PROT 7.2  ALBUMIN 3.7   Recent Labs  Lab 03/12/22 0832  LIPASE 40   Recent Labs  Lab 03/12/22 0832  AMMONIA 63*   Diabetic: Recent Labs    03/14/22 0514  HGBA1C 6.0*   Recent Labs  Lab 03/14/22 0856 03/14/22 1133  GLUCAP 244* 254*   Cardiac Enzymes: No results for input(s): "CKTOTAL", "CKMB", "CKMBINDEX", "TROPONINI" in the last 168 hours. No results for input(s): "PROBNP" in the last 8760 hours. Coagulation Profile: No results for input(s): "INR", "PROTIME" in the last 168 hours. Thyroid Function Tests: No results for input(s): "TSH", "T4TOTAL", "FREET4", "T3FREE", "THYROIDAB" in the last 72 hours. Lipid Profile: No results for input(s): "CHOL", "HDL", "LDLCALC", "TRIG", "CHOLHDL", "LDLDIRECT" in the last 72 hours. Anemia Panel: No results for input(s): "VITAMINB12", "FOLATE", "FERRITIN", "TIBC", "IRON", "RETICCTPCT" in the last 72 hours. Urine analysis:    Component Value Date/Time   COLORURINE YELLOW 03/12/2022 [place] 03/12/2022 0835   LABSPEC 1.014 03/12/2022 0835   PHURINE 5.0 03/12/2022 0835   GLUCOSEU NEGATIVE 03/12/2022 0835   HGBUR MODERATE (A) 03/12/2022 0835   BILIRUBINUR NEGATIVE 03/12/2022 0835   KETONESUR NEGATIVE 03/12/2022 0835   PROTEINUR NEGATIVE 03/12/2022 0835   UROBILINOGEN 1.0 03/04/2015 1904   NITRITE NEGATIVE 03/12/2022 0835   LEUKOCYTESUR NEGATIVE 03/12/2022 0835   Sepsis Labs: Invalid input(s): "PROCALCITONIN", "LACTICIDVEN"   SIGNED:  Mercy Riding, MD  [facility] 03/14/2022,  5:35 PM

## 2022-03-14 NOTE — Progress Notes (Signed)
Mobility Specialist - Progress Note   03/14/22 1007  Mobility  Activity Ambulated independently in hallway  Level of Assistance Independent  Assistive Device None  Distance Ambulated (ft) 250 ft  Activity Response Tolerated well  Mobility Referral Yes  $Mobility charge 1 Mobility   Nurse requested Mobility Specialist to perform oxygen saturation test with pt which includes removing pt from oxygen both at rest and while ambulating.  Below are the results from that testing.     Patient Saturations on Room Air at Rest = spO2 92%  Patient Saturations on Room Air while Ambulating = sp02 97% .  Rested and performed pursed lip breathing for 1 minute with sp02 at 97%.  Patient Saturations on 0 Liters of oxygen while Ambulating = sp02 97%  At end of testing pt left in room on 0  Liters of oxygen.  Reported results to nurse.    Pt received in bed and agreeable to mobility. No complaints during mobility. Pt to bed after session with all needs met.   [facility]  Mobility Specialist  

## 2022-03-17 LAB — CULTURE, BLOOD (ROUTINE X 2)
Culture: NO GROWTH
Special Requests: ADEQUATE

## 2022-03-18 LAB — CULTURE, BLOOD (ROUTINE X 2)
Culture: NO GROWTH
Special Requests: ADEQUATE

## 2022-03-27 ENCOUNTER — Ambulatory Visit: Payer: Self-pay | Admitting: Student

## 2022-03-27 NOTE — Progress Notes (Deleted)
Subjective:  Patient ID: Kelli Jenkins, female    DOB: February 24, 1961, 61 y.o.   MRN: [id number]  CC: New Patient  HPI:  Kelli Jenkins is a very pleasant 61 y.o. female who presents today to establish care.   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[facility])    uterine   Cocaine addiction ([place])    Hypokalemia    Nephrolithiasis    Pyelonephritis    SIRS (systemic inflammatory response syndrome) ([facility])    Tobacco abuse    UTI (urinary tract infection) 06/2013    Surgical Hx: Past Surgical History:  Procedure Laterality Date   ESOPHAGOGASTRODUODENOSCOPY N/A 09/22/2016   Procedure: ESOPHAGOGASTRODUODENOSCOPY (EGD);  Surgeon: Ronald Lobo, MD;  Location: [facility];  Service: Endoscopy;  Laterality: N/A;   RENAL ARTERY STENT     stones   TUBAL LIGATION     VAGINAL HYSTERECTOMY      Family Hx: No family history on file.  Social Hx: Current Social History   Who lives at home: *** 03/27/2022  Who would speak for you about health care matters: *** 03/27/2022  Transportation: *** 03/27/2022 Important Relationships & Pets: *** 03/27/2022  Current Stressors: *** 03/27/2022 Work / Education:  *** 03/27/2022 Religious / Personal Beliefs: *** 03/27/2022 Interests / Fun: *** 03/27/2022 Other: *** 03/27/2022   Medications:   ROS: Woman:  Patient reports no  vision/ hearing changes,anorexia, weight change, fever ,adenopathy, persistant / recurrent hoarseness, swallowing issues, chest pain, edema,persistant / recurrent cough, hemoptysis, dyspnea(rest, exertional, paroxysmal nocturnal), gastrointestinal  bleeding (melena, rectal bleeding), abdominal pain, excessive heart burn, GU symptoms(dysuria, hematuria, pyuria, voiding/incontinence  Issues) syncope, focal weakness, severe memory loss, concerning skin lesions, depression, anxiety, abnormal bruising/bleeding, major joint swelling, breast masses or abnormal vaginal bleeding.    Man:  Patient reports no  vision/  hearing changes,anorexia, weight change, fever ,adenopathy, persistant / recurrent hoarseness, swallowing issues, chest pain, edema,persistant / recurrent cough, hemoptysis, dyspnea(rest, exertional, paroxysmal nocturnal), gastrointestinal  bleeding (melena, rectal bleeding), abdominal pain, excessive heart burn, GU symptoms(dysuria, hematuria, pyuria, voiding/incontinence  Issues) syncope, focal weakness, severe memory loss, concerning skin lesions, depression, anxiety, abnormal bruising/bleeding, major joint swelling.    Preventative Screening Colonoscopy: year*** results *** Mammogram: year*** results *** Pap test: year*** results *** PSA: year*** results *** DEXA: year*** results *** Tetanus vaccine: year*** results *** Pneumonia vaccine: year*** results *** Shingles vaccine: year*** results *** Heart stress test: year*** results *** Echocardiogram: year*** results *** Xrays: year*** results *** CT/MRI: year*** results ***  Smoking status reviewed  ROS: pertinent noted in the HPI    Objective:  There were no vitals taken for this visit. Vitals and nursing note reviewed  General: NAD, pleasant, able to participate in exam HEENT: normocephalic, TM's visualized bilaterally, no scleral icterus or conjunctival pallor, no nasal discharge, moist mucous membranes, good dentition without erythema or discharge noted in posterior oropharynx Neck: supple, non-tender, without lymphadenopathy Cardiac: RRR, S1 S2 present. normal heart sounds, no murmurs. Respiratory: CTAB, normal effort, No wheezes, rales or rhonchi Abdomen: Normoactive bowel sounds, non-tender, non-distended, no hepatosplenomegaly Extremities: no edema or cyanosis. Skin: warm and dry, no rashes noted Neuro: alert, no obvious focal deficits Psych: Normal affect and mood  Assessment & Plan:  No problem-specific Assessment & Plan notes found for this encounter.   No orders of the defined types were placed in this  encounter.  No orders of the defined types were placed in this encounter.  No follow-ups on file. Aashka Salomone Data processing manager,  MD 03/27/2022, 12:03 PM PGY-***, [place]

## 2022-07-13 ENCOUNTER — Encounter (HOSPITAL_BASED_OUTPATIENT_CLINIC_OR_DEPARTMENT_OTHER): Payer: Self-pay

## 2022-07-13 ENCOUNTER — Emergency Department (HOSPITAL_BASED_OUTPATIENT_CLINIC_OR_DEPARTMENT_OTHER): Payer: Medicaid Other

## 2022-07-13 ENCOUNTER — Other Ambulatory Visit: Payer: Self-pay

## 2022-07-13 ENCOUNTER — Emergency Department (HOSPITAL_BASED_OUTPATIENT_CLINIC_OR_DEPARTMENT_OTHER)
Admission: EM | Admit: 2022-07-13 | Discharge: 2022-07-14 | Disposition: A | Discharge status: Home / Self Care | Payer: Medicaid Other | Attending: Emergency Medicine | Admitting: Emergency Medicine

## 2022-07-13 DIAGNOSIS — M25562 Pain in left knee: Secondary | ICD-10-CM | POA: Insufficient documentation

## 2022-07-13 DIAGNOSIS — M79605 Pain in left leg: Secondary | ICD-10-CM | POA: Insufficient documentation

## 2022-07-13 NOTE — Discharge Instructions (Addendum)
You were seen in the ER today for evaluation of your left knee pain.  Your x-ray does not show any broken bones.  You may have some tendon or ligament damage.  We have placed you in a knee immobilizer as well as some crutches.  You do not need to sleep or take a shower with this, you can take this off the meantime.  I have sent in the information for Kelli Jenkins is a sports medicine provider for you to call to schedule appointment to follow-up.  In the meantime, he can take Tylenol and/or ibuprofen as needed for pain.  Additionally, I would like you to return tomorrow for an ultrasound to rule out any DVT of your leg.  Included information.  Please come between 8 AM and 9 PM tomorrow.  If you have any concerns, new or worsening symptoms, please return to the nearest emergency department for evaluation.  Contact a doctor if: The knee pain does not stop. The knee pain changes or gets worse. You have a fever along with knee pain. Your knee is red or feels warm when you touch it. Your knee gives out or locks up. Get help right away if: Your knee swells, and the swelling gets worse. You cannot move your knee. You have very bad knee pain that does not get better with pain medicine.

## 2022-07-13 NOTE — ED Triage Notes (Signed)
Pt brought by EMS from home for left leg pain starting above knee and radiating down leg to toes. Pt states pain started 3-4 weeks ago and started as weakness in leg, reports dragging leg. Denies injury. Pt states that tonight when she was standing in the kitchen she felt like the side of her knee bulged out and she had excruciating pain and wasn't able to walk after. Pt able to move toes in triage, sensation intact. No obvious deformity noted.

## 2022-07-18 NOTE — ED Provider Notes (Addendum)
[place] [facility]GENC[facility]MENT AT [facility] Provider Note   CSN: [id number] Arrival date & time: 07/13/22  2026     History  Chief Complaint  Patient presents with   Leg Pain    Kelli Jenkins is a 62 y.o. female presents to the ER today for evaluation of her left knee pain. She reports that she has been chronically for the past few months. She reports that she was standing at the stove cooking when she went to rotate her body and felt that her knee became unstable, sliding out and then back in. She reports that she took an Aleve, but is still having pain. She denies any surgery previous to this on the knee or leg. She denies any chest pain, SOB, or IVDU. Nursing note mentions weakness and dragging her leg in the triage note, however the patient denies this to me and mentions that she has just been having increasing pain in the knee over the past few months.    Leg Pain Associated symptoms: no fever        Home Medications Prior to Admission medications   Medication Sig Start Date End Date Taking? Authorizing Provider  albuterol (VENTOLIN HFA) 108 (90 Base) MCG/ACT inhaler Inhale 2 puffs into the lungs every 6 (six) hours as needed for wheezing or shortness of breath. 03/14/22   Mercy Riding, MD  escitalopram (LEXAPRO) 5 MG tablet Take 1 tablet (5 mg total) by mouth daily. Patient not taking: Reported on 03/13/2022 09/28/20 10/28/20  Briant Cedar, MD  gabapentin (NEURONTIN) 100 MG capsule Take 1 capsule (100 mg total) by mouth 3 (three) times daily. Patient not taking: Reported on 03/13/2022 09/27/20   Briant Cedar, MD  pantoprazole (PROTONIX) 40 MG tablet Take 1 tablet (40 mg total) by mouth daily. 03/14/22 04/13/22  Mercy Riding, MD      Allergies    Darvocet [propoxyphene n-acetaminophen], Ibuprofen, and Tramadol    Review of Systems   Review of Systems  Constitutional:  Negative for chills and fever.  Respiratory:  Negative for shortness  of breath.   Cardiovascular:  Negative for chest pain and leg swelling.  Musculoskeletal:  Positive for arthralgias.    Physical Exam Updated Vital Signs BP 120/84   Pulse 68   Temp 98.1 F (36.7 C)   Resp 18   Ht 5\' 8"  (1.727 m)   Wt 106.6 kg   SpO2 96%   BMI 35.73 kg/m  Physical Exam Vitals and nursing note reviewed.  Constitutional:      General: She is not in acute distress.    Appearance: Normal appearance. She is not ill-appearing or toxic-appearing.  Eyes:     General: No scleral icterus. Pulmonary:     Effort: Pulmonary effort is normal. No respiratory distress.  Musculoskeletal:        General: Tenderness present.     Comments: Diffuse tenderness to the knee, mainly in the popliteal fossa area. No bulge, fluctuance, induration, warmth palpated in the area.  No overlying skin changes seen to the anterior knee. No increase in warmth or erythema. No petechiae visualized.  She has some tenderness surrounding the patella as well.  Patella appears intact.  Compartments are soft.  Sensation intact.  She has full flexion extension of the knee with more pain on flexion.  Negative anterior and posterior drawer test.  She is full strength intact however has increased pain against resistance.  I do not appreciate any leg swelling.  Palpable DP and PT pulses.  Cap refill brisk.  Coloration and temperature appear and feel symmetric on both legs.  Skin:    General: Skin is dry.     Findings: No rash.  Neurological:     General: No focal deficit present.     Mental Status: She is alert. Mental status is at baseline.  Psychiatric:        Mood and Affect: Mood normal.     ED Results / Procedures / Treatments   Labs (all labs ordered are listed, but only abnormal results are displayed) Labs Reviewed - No data to display  EKG None  Radiology DG Knee Complete 4 Views Left  Result Date: 07/13/2022 CLINICAL DATA:  Pain. Worsening left knee pain x 3-4 wks, states she cannot bear  weight now. Denies injury. EXAM: LEFT KNEE - COMPLETE 4+ VIEW COMPARISON:  X-ray left knee 11/10/2009 FINDINGS: No evidence of fracture, dislocation, or joint effusion. No evidence of arthropathy or other focal bone abnormality. Soft tissues are unremarkable. IMPRESSION: Negative. Electronically Signed   By: Iven Finn M.D.   On: 07/13/2022 21:48     Procedures Procedures   Medications Ordered in ED Medications - No data to display  ED Course/ Medical Decision Making/ A&P                           Medical Decision Making Amount and/or Complexity of Data Reviewed Radiology: ordered.    62 y.o. female presents to the ER today for evaluation of left knee pain. Differential diagnosis includes but is not limited to strain, sprain, contusion, septic arthritis, osteoarthritis, tendon/ligament damage, DVT, Bakers cyst. Vital signs unremarkable. Physical exam as noted above.   XR imaging negative.   Patient was placed in a knee brace and was given crutches. This is likely a tendon/ligament injury. Given that the patient has pain in the posterior aspect, I have ordered a US DVT to be performed as well. I discussed with the patient why this was being ordered and she agrees on coming in the morning. She is weight bearing with the brace, but thinks she will feel better with the crutches. Crutches provided.  I do not think this is any septic arthritis.  Patient does not have any overlying warmth or erythema to the joint.  There is no petechiae seen.  No fluctuance or induration seen.  She has full active flexion and extension with some pain.  Her compartments are soft she has good sensation and good pulses.  I doubt any compartment syndrome.  DVT is less likely however given that she reports the pain is mainly in the popliteal and goes up the more posterior part of her leg ending is reasonable.  The information for the orthopedic provider listed in the discharge paperwork.  We did discuss calling to  schedule an appointment.  We discussed plan at bedside of following up with ortho, DVT US, RICE method, and pain management with Tylenol and Ibuprofen. We discussed strict return precautions and red flag symptoms. The patient verbalized their understanding and agrees to the plan. The patient is stable and being discharged home in good condition.  Portions of this report may have been transcribed using voice recognition software. Every effort was made to ensure accuracy; however, inadvertent computerized transcription errors may be present.    Final Clinical Impression(s) / ED Diagnoses Final diagnoses:  Acute pain of left knee    Rx / DC Orders ED  Discharge Orders          Ordered    US Venous Img Lower Unilateral Left        07/13/22 2356              Sherrell Puller, PA-C 07/18/22 1108    Sherrell Puller, PA-C 07/18/22 1129    Davonna Belling, MD 07/23/22 1447

## 2022-07-30 ENCOUNTER — Emergency Department (HOSPITAL_COMMUNITY): Payer: Medicaid Other

## 2022-07-30 ENCOUNTER — Emergency Department (HOSPITAL_COMMUNITY)
Admission: EM | Admit: 2022-07-30 | Discharge: 2022-07-30 | Disposition: A | Discharge status: Home / Self Care | Payer: Medicaid Other | Attending: Emergency Medicine | Admitting: Emergency Medicine

## 2022-07-30 ENCOUNTER — Emergency Department (HOSPITAL_BASED_OUTPATIENT_CLINIC_OR_DEPARTMENT_OTHER): Payer: Medicaid Other

## 2022-07-30 DIAGNOSIS — J449 Chronic obstructive pulmonary disease, unspecified: Secondary | ICD-10-CM | POA: Diagnosis not present

## 2022-07-30 DIAGNOSIS — R0602 Shortness of breath: Secondary | ICD-10-CM | POA: Diagnosis present

## 2022-07-30 LAB — CBC
HCT: 40.4 % (ref 36.0–46.0)
Hemoglobin: 13.7 g/dL (ref 12.0–15.0)
MCH: 31.6 pg (ref 26.0–34.0)
MCHC: 33.9 g/dL (ref 30.0–36.0)
MCV: 93.1 fL (ref 80.0–100.0)
Platelets: 305 10*3/uL (ref 150–400)
RBC: 4.34 MIL/uL (ref 3.87–5.11)
RDW: 13.3 % (ref 11.5–15.5)
WBC: 10.3 10*3/uL (ref 4.0–10.5)
nRBC: 0 % (ref 0.0–0.2)

## 2022-07-30 LAB — URINALYSIS, ROUTINE W REFLEX MICROSCOPIC
Bilirubin Urine: NEGATIVE
Glucose, UA: NEGATIVE mg/dL
Ketones, ur: NEGATIVE mg/dL
Leukocytes,Ua: NEGATIVE
Nitrite: NEGATIVE
Protein, ur: NEGATIVE mg/dL
Specific Gravity, Urine: 1.023 (ref 1.005–1.030)
pH: 5 (ref 5.0–8.0)

## 2022-07-30 LAB — COMPREHENSIVE METABOLIC PANEL
ALT: 15 U/L (ref 0–44)
AST: 18 U/L (ref 15–41)
Albumin: 3.2 g/dL — ABNORMAL LOW (ref 3.5–5.0)
Alkaline Phosphatase: 81 U/L (ref 38–126)
Anion gap: 8 (ref 5–15)
BUN: 14 mg/dL (ref 8–23)
CO2: 26 mmol/L (ref 22–32)
Calcium: 8.7 mg/dL — ABNORMAL LOW (ref 8.9–10.3)
Chloride: 105 mmol/L (ref 98–111)
Creatinine, Ser: 0.96 mg/dL (ref 0.44–1.00)
GFR, Estimated: >60 mL/min (ref >60)
Glucose, Bld: 196 mg/dL — ABNORMAL HIGH (ref 70–99)
Potassium: 4.3 mmol/L (ref 3.5–5.1)
Sodium: 139 mmol/L (ref 135–145)
Total Bilirubin: 0.4 mg/dL (ref 0.3–1.2)
Total Protein: 6.2 g/dL — ABNORMAL LOW (ref 6.5–8.1)

## 2022-07-30 LAB — BRAIN NATRIURETIC PEPTIDE: B Natriuretic Peptide: 40.7 pg/mL (ref 0.0–100.0)

## 2022-07-30 LAB — D-DIMER, QUANTITATIVE: D-Dimer, Quant: <0.27 ug/mL-FEU (ref 0.00–0.50)

## 2022-07-30 LAB — LIPASE, BLOOD: Lipase: 38 U/L (ref 11–51)

## 2022-07-30 MED ORDER — PREDNISONE 10 MG PO TABS
40.0000 mg | ORAL_TABLET | Freq: Every day | ORAL | 0 refills | Status: AC
  Filled 2022-07-30: qty 15, 3d supply, fill #0

## 2022-07-30 MED ORDER — PREDNISONE 20 MG PO TABS
40.0000 mg | ORAL_TABLET | Freq: Once | ORAL | Status: AC
  Administered 2022-07-30: 40 mg via ORAL
  Filled 2022-07-30: qty 2

## 2022-07-30 MED ORDER — IPRATROPIUM-ALBUTEROL 0.5-2.5 (3) MG/3ML IN SOLN
3.0000 mL | Freq: Once | RESPIRATORY_TRACT | Status: AC
  Administered 2022-07-30: 3 mL via RESPIRATORY_TRACT
  Filled 2022-07-30: qty 3

## 2022-07-30 MED ORDER — ALBUTEROL SULFATE HFA 108 (90 BASE) MCG/ACT IN AERS
1.0000 | INHALATION_SPRAY | Freq: Four times a day (QID) | RESPIRATORY_TRACT | 0 refills | Status: AC | PRN
  Filled 2022-07-30: qty 6.7, 25d supply, fill #0

## 2022-07-30 NOTE — Discharge Instructions (Addendum)
Thank you for allowing [place] to take care of you today.  You came to the Emergency Department today because you have been feeling poorly and have been feeling short of breath.  Here in the emergency department you are having significant wheezing.  You have most likely having some flare of underlying chronic obstructive pulmonary disease or COPD, which is a common complication of long-term smoking.  This causes wheezes and shortness of breath as well as chronic cough and sputum production.  We also checked a heart failure number and a blood clot number which were not elevated, there is no evidence of pneumonia on your chest x-ray, given all of this you are stable for discharge, you should follow-up with the pulmonary [facility] for a formal diagnosis of COPD, please see the first page for the local pulmonologist and please schedule an appointment with them.   To-Do: 1. Please follow-up with your primary doctor within 2 days / as soon as possible. If you need to schedule an appointment with a new primary care doctor, call [phone].  Or call [facility]) at [phone] to establish primary care  We are giving you a prescription for prednisone. You should take this every day for the next 4 days.  You are given you prescription for albuterol, you should take 2 puffs every 4 hours for the next 48 hours, and thereafter as needed  Please return to the Emergency Department or call 911 if you experience have worsening of your symptoms, or do not get better, new or different chest pain, shortness of breath, severe or significantly worsening pain, high fever, severe confusion, pass out or have any reason to think that you need emergency medical care.   We hope you feel better soon.   Curley Spice, MD Department of Emergency Medicine [facility]

## 2022-07-30 NOTE — ED Provider Notes (Signed)
[place] EMERGENCY DEPARTMENT AT [facility] Provider Note  Medical Decision Making   HPI: Kelli Jenkins is a 62 y.o. female with history perinent tobacco use disorder, prior pneumonia requiring hospitalization, nephrolithiasis who presents complaining of shortness of breath. Patient arrived via POV. History provided by patient.  No interpreter required for this encounter.  Patient reports that she has been feeling poorly for approximately 10 days.  Feels that this is similar to her prior episodes of pneumonia.  Reports increased cough productive of green sputum as well as shortness of breath.  Feels that shortness of breath is worsened with activity as well as laying flat.  Endorses associated vomiting intermittently for approximately 7 days, approximately 2 days of diarrhea, however states that she is still able to tolerate p.o.  Endorses greater than 20 years of smoking history, currently half pack per day, however denies prior history of asthma or COPD.  Feels that she has had subjective fever and chills over the past several days, however none today.  Patient also reports that she has recently been evaluated for knee pain, and reports that she was ordered an ultrasound that she was unable to have obtained, therefore specific can be obtained in the ED today.  ROS: As per HPI. Please see MAR for complete past medical history, surgical history, and social history.   Physical exam is pertinent for mild tachypnea, bilateral wheezing, though saturating well on room air, abdomen soft, nontender to palpation, well-hydrated.   The differential includes but is not limited to pneumonia, pneumothorax, pleural effusion, viral infection, COPD exacerbation, PE.  Additional history obtained from: Chart review External records from outside source obtained and reviewed including: Reviewed patient's recent evaluation for knee pain, patient previously had DVT ultrasound ordered which has yet  to be obtained  ED provider interpretation of radiology/imaging: Reviewed patient's chest x-ray, I do not appreciate any focal airspace opacification, cardiomediastinal silhouette derangement, pleural effusion, bony displacement, pneumothorax.  Labs ordered were interpreted by myself as well as my attending and were incorporated into the medical decision making process for this patient.  ED provider interpretation of labs: CBC without leukocytosis, anemia, thrombocytopenia.  UA without UTI, paced WNL, CMP without AKI or emergent electrolyte derangement.  D-dimer undetectable, BMP WNL.  Interventions: DuoNebs, prednisone  See the EMR for full details regarding lab and imaging results.  On exam, patient overall well-appearing, normal work of breathing, vitally stable.  Patient does have diffuse wheezing bilaterally which is concerning for COPD, no known history of COPD or asthma, though patient does report a extensive smoking history.  Labs and imaging as indicated.  The patient triage complaint is initially noted as emesis, patient claims that shortness of breath is her primary concern, overall seems that emesis is more related to posttussive emesis rather than primary emesis driven pathology.  Otherwise patient tolerating p.o., no emergent electrolyte derangements, lipase WNL, doubt emergent intra-abdominal pathology such as pancreatitis, bowel obstruction.  With regard to shortness of breath, no evidence of pneumonia, pneumothorax, pleural effusion on chest x-ray.  Discussed with patient that viral infection is a possibility given overall constellation of symptoms.  Given recent evaluation for DVT, do feel that patient warrants DVT ultrasound as well as D-dimer to further stratify for PE, present does not reveal DVT, D-dimer WNL, do not feel that patient requires CTA chest to further evaluate for PE at this time.  DuoNebs and prednisone administered in the ED, on reevaluation, patient with improved  aeration, and subjective improvement  in shortness of breath.  Discussed that she likely has underlying COPD that has yet to be diagnosed.  Given patient is saturating well on room air, no respiratory distress, do not feel that patient requires admission for COPD exacerbation at this time, appropriate for outpatient management.  Prescribed prednisone, albuterol inhaler, and given clinic name of local pulmonology office and encouraged to create follow-up appointment for likely underlying COPD.  Provided descriptions for 4 additional days of 40 mg prednisone for total 5 days, as well as albuterol inhaler.  Discharged in stable condition.   Consults: Not indicated  Disposition:  The plan for this patient was discussed with Dr. Jodi Mourning, who voiced agreement and who oversaw evaluation and treatment of this patient.  Clinical Impression:  1. Chronic obstructive pulmonary disease, unspecified COPD type    Discharge  Therapies: These medications and interventions were provided for the patient while in the ED. Medications  ipratropium-albuterol (DUONEB) 0.5-2.5 (3) MG/3ML nebulizer solution 3 mL (3 mLs Nebulization Given 07/30/22 1651)  ipratropium-albuterol (DUONEB) 0.5-2.5 (3) MG/3ML nebulizer solution 3 mL (3 mLs Nebulization Given 07/30/22 1723)    Followed by  ipratropium-albuterol (DUONEB) 0.5-2.5 (3) MG/3ML nebulizer solution 3 mL (3 mLs Nebulization Given 07/30/22 1723)  predniSONE (DELTASONE) tablet 40 mg (40 mg Oral Given 07/30/22 1723)    MDM generated using voice dictation software and may contain dictation errors.  Please contact me for any clarification or with any questions.  Clinical Complexity A medically appropriate history, review of systems, and physical exam was performed.  Collateral history obtained from:chart review I personally reviewed the labs, EKG, imaging as discussed above. Patient's presentation is most consistent with acute complicated illness/injury requiring diagnostic  workup. Considered and ruled out life and body threatening conditions  Medications: Prescription Discussed patient's care with providers from the following different specialties: None    Physical Exam   ED Triage Vitals  Enc Vitals Group     BP 07/30/22 1443 (!) 152/122     Pulse Rate 07/30/22 1443 100     Resp 07/30/22 1443 20     Temp 07/30/22 1443 97.6 F (36.4 C)     Temp Source 07/30/22 1443 Oral     SpO2 07/30/22 1443 97 %     Weight --      Height --      Head Circumference --      Peak Flow --      Pain Score 07/30/22 1444 5     Pain Loc --      Pain Edu? --      Excl. in GC? --      Physical Exam Vitals and nursing note reviewed.  Constitutional:      General: She is not in acute distress.    Appearance: She is well-developed.  HENT:     Head: Normocephalic and atraumatic.  Eyes:     Conjunctiva/sclera: Conjunctivae normal.  Cardiovascular:     Rate and Rhythm: Normal rate and regular rhythm.     Heart sounds: No murmur heard.    Comments: Heart rate 90s on my exam Pulmonary:     Effort: Pulmonary effort is normal. No respiratory distress.     Breath sounds: Wheezing (Bilateral wheezing most prominent in the posterior lung fields) present.     Comments: Mild tachypnea Abdominal:     General: There is no distension.     Palpations: Abdomen is soft. There is no mass.     Tenderness: There is no abdominal tenderness.  There is no guarding or rebound.  Musculoskeletal:        General: No swelling.     Cervical back: Neck supple.     Right lower leg: No edema.     Left lower leg: No edema.  Skin:    General: Skin is warm and dry.     Capillary Refill: Capillary refill takes less than 2 seconds.  Neurological:     Mental Status: She is alert and oriented to person, place, and time.  Psychiatric:        Mood and Affect: Mood normal.       Procedure Note  Procedures  Julianne RiceL. S. Iran Rowe, MD Emergency Medicine, PGY-2     Curley SpiceStanek, Karesa Maultsby, MD 07/30/22  Clarisa [id number]    Blane OharaZavitz, Joshua, MD 07/30/22 2348

## 2022-07-30 NOTE — ED Notes (Signed)
Pt transported to XRay 

## 2022-07-30 NOTE — ED Notes (Signed)
Ultrasound at bedside

## 2022-07-30 NOTE — ED Triage Notes (Signed)
Patient here for evaluation of vomiting and generally feeling unwell that started approximately ten days ago. Patient states she frequently develops pnuemonia and is concerned that could be happening. Patient is alert, oriented, speaking in complete setnences, ambulating independently with steady gait, and is in no apparent distress at this time.

## 2022-07-30 NOTE — Progress Notes (Signed)
Left lower extremity venous study completed.   Preliminary results relayed to RN.  Please see CV Procedures for preliminary results.  Christene Lye, RVT  4:46 PM 07/30/22

## 2022-07-31 ENCOUNTER — Other Ambulatory Visit: Payer: Self-pay

## 2022-07-31 ENCOUNTER — Other Ambulatory Visit (HOSPITAL_COMMUNITY): Payer: Self-pay

## 2022-08-09 ENCOUNTER — Other Ambulatory Visit (HOSPITAL_COMMUNITY): Payer: Self-pay

## 2022-11-29 ENCOUNTER — Other Ambulatory Visit: Payer: Self-pay

## 2023-05-08 ENCOUNTER — Ambulatory Visit: Payer: Medicaid Other | Admitting: Obstetrics and Gynecology

## 2023-05-08 ENCOUNTER — Encounter: Payer: Self-pay | Admitting: Obstetrics and Gynecology

## 2023-06-28 ENCOUNTER — Other Ambulatory Visit (HOSPITAL_COMMUNITY): Payer: Self-pay

## 2023-06-28 ENCOUNTER — Emergency Department (HOSPITAL_COMMUNITY)

## 2023-06-28 ENCOUNTER — Other Ambulatory Visit: Payer: Self-pay

## 2023-06-28 ENCOUNTER — Encounter (HOSPITAL_COMMUNITY): Payer: Self-pay | Admitting: Emergency Medicine

## 2023-06-28 ENCOUNTER — Emergency Department (HOSPITAL_COMMUNITY)
Admission: EM | Admit: 2023-06-28 | Discharge: 2023-06-28 | Disposition: A | Discharge status: Home / Self Care | Attending: Emergency Medicine | Admitting: Emergency Medicine

## 2023-06-28 DIAGNOSIS — Z8541 Personal history of malignant neoplasm of cervix uteri: Secondary | ICD-10-CM | POA: Insufficient documentation

## 2023-06-28 DIAGNOSIS — J40 Bronchitis, not specified as acute or chronic: Secondary | ICD-10-CM | POA: Insufficient documentation

## 2023-06-28 DIAGNOSIS — Z8616 Personal history of COVID-19: Secondary | ICD-10-CM | POA: Diagnosis not present

## 2023-06-28 DIAGNOSIS — R0602 Shortness of breath: Secondary | ICD-10-CM | POA: Diagnosis present

## 2023-06-28 LAB — RESP PANEL BY RT-PCR (RSV, FLU A&B, COVID)  RVPGX2
Influenza A by PCR: NEGATIVE
Influenza B by PCR: NEGATIVE
Resp Syncytial Virus by PCR: NEGATIVE
SARS Coronavirus 2 by RT PCR: NEGATIVE

## 2023-06-28 LAB — BASIC METABOLIC PANEL
Anion gap: 10 (ref 5–15)
BUN: 11 mg/dL (ref 8–23)
CO2: 26 mmol/L (ref 22–32)
Calcium: 8.4 mg/dL — ABNORMAL LOW (ref 8.9–10.3)
Chloride: 103 mmol/L (ref 98–111)
Creatinine, Ser: 0.9 mg/dL (ref 0.44–1.00)
GFR, Estimated: >60 mL/min (ref >60)
Glucose, Bld: 133 mg/dL — ABNORMAL HIGH (ref 70–99)
Potassium: 4 mmol/L (ref 3.5–5.1)
Sodium: 139 mmol/L (ref 135–145)

## 2023-06-28 LAB — CBC WITH DIFFERENTIAL/PLATELET
Abs Immature Granulocytes: 0.06 10*3/uL (ref 0.00–0.07)
Basophils Absolute: 0.1 10*3/uL (ref 0.0–0.1)
Basophils Relative: 1 %
Eosinophils Absolute: 0.1 10*3/uL (ref 0.0–0.5)
Eosinophils Relative: 0 %
HCT: 39.8 % (ref 36.0–46.0)
Hemoglobin: 13.2 g/dL (ref 12.0–15.0)
Immature Granulocytes: 0 %
Lymphocytes Relative: 9 %
Lymphs Abs: 1.4 10*3/uL (ref 0.7–4.0)
MCH: 31.7 pg (ref 26.0–34.0)
MCHC: 33.2 g/dL (ref 30.0–36.0)
MCV: 95.7 fL (ref 80.0–100.0)
Monocytes Absolute: 0.9 10*3/uL (ref 0.1–1.0)
Monocytes Relative: 6 %
Neutro Abs: 12.8 10*3/uL — ABNORMAL HIGH (ref 1.7–7.7)
Neutrophils Relative %: 84 %
Platelets: 254 10*3/uL (ref 150–400)
RBC: 4.16 MIL/uL (ref 3.87–5.11)
RDW: 13.8 % (ref 11.5–15.5)
WBC: 15.2 10*3/uL — ABNORMAL HIGH (ref 4.0–10.5)
nRBC: 0 % (ref 0.0–0.2)

## 2023-06-28 MED ORDER — SODIUM CHLORIDE 0.9 % IV BOLUS
500.0000 mL | Freq: Once | INTRAVENOUS | Status: AC
  Administered 2023-06-28: 500 mL via INTRAVENOUS

## 2023-06-28 MED ORDER — AZITHROMYCIN 250 MG PO TABS
ORAL_TABLET | ORAL | 0 refills | Status: DC
  Filled 2023-06-28: qty 6, 5d supply, fill #0

## 2023-06-28 NOTE — ED Provider Notes (Signed)
 [place] EMERGENCY DEPARTMENT AT [facility] Provider Note  CSN: [id number] Arrival date & time: 06/28/23 0157  Chief Complaint(s) Shortness of Breath  HPI Kelli Jenkins is a 63 y.o. female    The history is provided by the patient.  Shortness of Breath Severity:  Moderate Onset quality:  Gradual Duration:  2 days Timing:  Constant Progression:  Worsening Chronicity:  Recurrent Context: weather changes   Relieved by:  Nothing Worsened by:  Coughing Associated symptoms: cough (productive) and sputum production   Associated symptoms: no chest pain, no fever and no wheezing     Past Medical History Past Medical History:  Diagnosis Date   Cancer (HCC)    uterine   Cocaine addiction (HCC)    Hypokalemia    Nephrolithiasis    Pyelonephritis    SIRS (systemic inflammatory response syndrome) (HCC)    Tobacco abuse    UTI (urinary tract infection) 06/2013   Patient Active Problem List   Diagnosis Date Noted   Fall at home, initial encounter 03/14/2022   AMS (altered mental status) 03/12/2022   Cocaine use 03/12/2022   COVID-19 virus infection 03/12/2022   Cellulitis 01/22/2020   Severe sepsis (HCC) 01/22/2020   Homelessness 01/22/2020   Chest pain 06/08/2016   Nausea & vomiting 06/08/2016   Intractable vomiting with nausea    CAP (community acquired pneumonia) 05/03/2016   Normocytic anemia 05/03/2016   Urinary tract infection without hematuria    Opioid overdose (HCC) 01/21/2016   Somnolence 01/21/2016   Cocaine abuse (HCC) 01/21/2016   Opioid abuse (HCC) 01/21/2016   UTI (urinary tract infection) 07/13/2013   HCAP (healthcare-associated pneumonia) 08/16/2011   Migraine 08/15/2011   SIRS (systemic inflammatory response syndrome) (HCC) 08/13/2011   Pyelonephritis 08/12/2011   Ureteral colic 08/12/2011   Hypotension 08/12/2011   Fever 08/12/2011   Nephrolithiasis 08/12/2011   Leukocytosis 08/12/2011   Tobacco use disorder 08/12/2011    Home Medication(s) Prior to Admission medications   Medication Sig Start Date End Date Taking? Authorizing Provider  azithromycin (ZITHROMAX Z-PAK) 250 MG tablet Take 500 mg on day 1, then 250 mg once a day for days 2, 3, 4, and 5. 06/28/23  Yes Janine Reller, Amadeo Garnet, MD  albuterol (VENTOLIN HFA) 108 (90 Base) MCG/ACT inhaler Inhale 2 puffs into the lungs every 6 (six) hours as needed for wheezing or shortness of breath. 03/14/22   Almon Hercules, MD  albuterol (VENTOLIN HFA) 108 (90 Base) MCG/ACT inhaler Inhale 1-2 puffs into the lungs every 6 (six) hours as needed for wheezing or shortness of breath. 07/30/22   Curley Spice, MD  escitalopram (LEXAPRO) 5 MG tablet Take 1 tablet (5 mg total) by mouth daily. Patient not taking: Reported on 03/13/2022 09/28/20 10/28/20  Lauro Franklin, MD  gabapentin (NEURONTIN) 100 MG capsule Take 1 capsule (100 mg total) by mouth 3 (three) times daily. Patient not taking: Reported on 03/13/2022 09/27/20   Lauro Franklin, MD  pantoprazole (PROTONIX) 40 MG tablet Take 1 tablet (40 mg total) by mouth daily. 03/14/22 04/13/22  Almon Hercules, MD  Allergies Darvocet [propoxyphene n-acetaminophen], Ibuprofen, and Tramadol  Review of Systems Review of Systems  Constitutional:  Negative for fever.  Respiratory:  Positive for cough (productive), sputum production and shortness of breath. Negative for wheezing.   Cardiovascular:  Negative for chest pain.   As noted in HPI  Physical Exam Vital Signs  I have reviewed the triage vital signs BP (!) 111/50   Pulse 89   Temp 99.1 F (37.3 C) (Oral)   Resp 20   Ht 5\' 6"  (1.676 m)   Wt 101.2 kg   SpO2 99%   BMI 35.99 kg/m   Physical Exam Vitals reviewed.  Constitutional:      General: She is not in acute distress.    Appearance: She is well-developed. She is not  diaphoretic.  HENT:     Head: Normocephalic and atraumatic.     Nose: Nose normal.  Eyes:     General: No scleral icterus.       Right eye: No discharge.        Left eye: No discharge.     Conjunctiva/sclera: Conjunctivae normal.     Pupils: Pupils are equal, round, and reactive to light.  Cardiovascular:     Rate and Rhythm: Normal rate and regular rhythm.     Heart sounds: No murmur heard.    No friction rub. No gallop.  Pulmonary:     Effort: Pulmonary effort is normal. No respiratory distress.     Breath sounds: Normal breath sounds. No stridor. No rales.  Abdominal:     General: There is no distension.     Palpations: Abdomen is soft.     Tenderness: There is no abdominal tenderness.  Musculoskeletal:        General: No tenderness.     Cervical back: Normal range of motion and neck supple.  Skin:    General: Skin is warm and dry.     Findings: No erythema or rash.  Neurological:     Mental Status: She is alert and oriented to person, place, and time.     ED Results and Treatments Labs (all labs ordered are listed, but only abnormal results are displayed) Labs Reviewed  CBC WITH DIFFERENTIAL/PLATELET - Abnormal; Notable for the following components:      Result Value   WBC 15.2 (*)    Neutro Abs 12.8 (*)    All other components within normal limits  BASIC METABOLIC PANEL - Abnormal; Notable for the following components:   Glucose, Bld 133 (*)    Calcium 8.4 (*)    All other components within normal limits  RESP PANEL BY RT-PCR (RSV, FLU A&B, COVID)  RVPGX2                                                                                                                         EKG  EKG Interpretation Date/Time:  Friday June 28 2023 02:12:06 EST Ventricular Rate:  101 PR Interval:  160 QRS Duration:  78 QT Interval:  355 QTC Calculation: 461 R Axis:   82  Text Interpretation: Sinus tachycardia Borderline right axis deviation Confirmed by Drema Pry  [phone]) on 06/28/2023 4:38:45 AM       Radiology DG Chest 2 View Result Date: 06/28/2023 CLINICAL DATA:  Shortness of breath. EXAM: CHEST - 2 VIEW COMPARISON:  07/30/2022 FINDINGS: The lungs are clear without focal pneumonia, edema, pneumothorax or pleural effusion. The cardiopericardial silhouette is within normal limits for size. No acute bony abnormality. Telemetry leads overlie the chest. IMPRESSION: No active cardiopulmonary disease. Electronically Signed   By: Kennith Center M.D.   On: 06/28/2023 05:38    Medications Ordered in ED Medications  sodium chloride 0.9 % bolus 500 mL (0 mLs Intravenous Stopped 06/28/23 1914)   Procedures Procedures  (including critical care time) Medical Decision Making / ED Course   Medical Decision Making Amount and/or Complexity of Data Reviewed Labs: ordered. Decision-making details documented in ED Course. Radiology: ordered and independent interpretation performed. Decision-making details documented in ED Course. ECG/medicine tests: ordered and independent interpretation performed. Decision-making details documented in ED Course.    Patient is here with productive cough and shortness of breath.  Lungs clear to auscultation bilaterally without wheezing.  Satting well on room air.  Patient has leukocytosis without anemia.  No significant electrolyte derangements.  Chest x-ray without evidence of pneumonia, pneumothorax, pulmonary edema pleural effusions.    Final Clinical Impression(s) / ED Diagnoses Final diagnoses:  Bronchitis   The patient appears reasonably screened and/or stabilized for discharge and I doubt any other medical condition or other [facility] requiring further screening, evaluation, or treatment in the ED at this time. I have discussed the findings, Dx and Tx plan with the patient/family who expressed understanding and agree(s) with the plan. Discharge instructions discussed at length. The patient/family was given strict return precautions  who verbalized understanding of the instructions. No further questions at time of discharge.  Disposition: Discharge  Condition: Good  ED Discharge Orders          Ordered    azithromycin (ZITHROMAX Z-PAK) 250 MG tablet        06/28/23 0719             Follow Up: Primary care provider  Call       This chart was dictated using voice recognition software.  Despite best efforts to proofread,  errors can occur which can change the documentation meaning.    Nira Conn, MD 06/28/23 [phone]

## 2023-06-28 NOTE — ED Triage Notes (Signed)
 63 y/o female comes in c/o sob and stating " I can't breath and I can't get warm." Pt reports her symptoms started yesterday, but denies and fevers, n/v/d. Pt also reports she "lives" in her car and does not recall her medical hx or medications she does not take any daily meds. Pt breathing easy with equal chest rise and able to speak in full sentences without taking a breath

## 2023-06-29 ENCOUNTER — Emergency Department (HOSPITAL_COMMUNITY)
Admission: EM | Admit: 2023-06-29 | Discharge: 2023-06-29 | Disposition: A | Discharge status: Home / Self Care | Attending: Emergency Medicine | Admitting: Emergency Medicine

## 2023-06-29 ENCOUNTER — Other Ambulatory Visit: Payer: Self-pay

## 2023-06-29 ENCOUNTER — Encounter (HOSPITAL_COMMUNITY): Payer: Self-pay

## 2023-06-29 ENCOUNTER — Emergency Department (HOSPITAL_COMMUNITY)

## 2023-06-29 DIAGNOSIS — J40 Bronchitis, not specified as acute or chronic: Secondary | ICD-10-CM

## 2023-06-29 DIAGNOSIS — D72829 Elevated white blood cell count, unspecified: Secondary | ICD-10-CM | POA: Insufficient documentation

## 2023-06-29 DIAGNOSIS — R791 Abnormal coagulation profile: Secondary | ICD-10-CM | POA: Diagnosis not present

## 2023-06-29 DIAGNOSIS — R079 Chest pain, unspecified: Secondary | ICD-10-CM

## 2023-06-29 DIAGNOSIS — Z8542 Personal history of malignant neoplasm of other parts of uterus: Secondary | ICD-10-CM | POA: Diagnosis not present

## 2023-06-29 DIAGNOSIS — R509 Fever, unspecified: Secondary | ICD-10-CM | POA: Diagnosis present

## 2023-06-29 LAB — BASIC METABOLIC PANEL
Anion gap: 11 (ref 5–15)
BUN: 11 mg/dL (ref 8–23)
CO2: 21 mmol/L — ABNORMAL LOW (ref 22–32)
Calcium: 8.7 mg/dL — ABNORMAL LOW (ref 8.9–10.3)
Chloride: 104 mmol/L (ref 98–111)
Creatinine, Ser: 0.85 mg/dL (ref 0.44–1.00)
GFR, Estimated: >60 mL/min (ref >60)
Glucose, Bld: 132 mg/dL — ABNORMAL HIGH (ref 70–99)
Potassium: 3.8 mmol/L (ref 3.5–5.1)
Sodium: 136 mmol/L (ref 135–145)

## 2023-06-29 LAB — URINALYSIS, ROUTINE W REFLEX MICROSCOPIC
Bilirubin Urine: NEGATIVE
Glucose, UA: NEGATIVE mg/dL
Ketones, ur: NEGATIVE mg/dL
Nitrite: NEGATIVE
Protein, ur: NEGATIVE mg/dL
Specific Gravity, Urine: 1.001 — ABNORMAL LOW (ref 1.005–1.030)
pH: 7 (ref 5.0–8.0)

## 2023-06-29 LAB — CBC
HCT: 38.8 % (ref 36.0–46.0)
Hemoglobin: 12.9 g/dL (ref 12.0–15.0)
MCH: 31.8 pg (ref 26.0–34.0)
MCHC: 33.2 g/dL (ref 30.0–36.0)
MCV: 95.6 fL (ref 80.0–100.0)
Platelets: 229 10*3/uL (ref 150–400)
RBC: 4.06 MIL/uL (ref 3.87–5.11)
RDW: 13.6 % (ref 11.5–15.5)
WBC: 14 10*3/uL — ABNORMAL HIGH (ref 4.0–10.5)
nRBC: 0 % (ref 0.0–0.2)

## 2023-06-29 LAB — TROPONIN I (HIGH SENSITIVITY): Troponin I (High Sensitivity): 4 ng/L (ref <18)

## 2023-06-29 LAB — I-STAT CG4 LACTIC ACID, ED: Lactic Acid, Venous: 0.6 mmol/L (ref 0.5–1.9)

## 2023-06-29 LAB — D-DIMER, QUANTITATIVE: D-Dimer, Quant: 0.69 ug{FEU}/mL — ABNORMAL HIGH (ref 0.00–0.50)

## 2023-06-29 LAB — RAPID URINE DRUG SCREEN, HOSP PERFORMED
Amphetamines: NOT DETECTED
Barbiturates: NOT DETECTED
Benzodiazepines: NOT DETECTED
Cocaine: POSITIVE — AB
Opiates: NOT DETECTED
Tetrahydrocannabinol: NOT DETECTED

## 2023-06-29 LAB — SARS CORONAVIRUS 2 BY RT PCR: SARS Coronavirus 2 by RT PCR: NEGATIVE

## 2023-06-29 MED ORDER — IOHEXOL 350 MG/ML SOLN
75.0000 mL | Freq: Once | INTRAVENOUS | Status: AC | PRN
  Administered 2023-06-29: 75 mL via INTRAVENOUS

## 2023-06-29 MED ORDER — ACETAMINOPHEN 500 MG PO TABS
1000.0000 mg | ORAL_TABLET | Freq: Once | ORAL | Status: AC
  Administered 2023-06-29: 1000 mg via ORAL
  Filled 2023-06-29: qty 2

## 2023-06-29 MED ORDER — AZITHROMYCIN 250 MG PO TABS: ORAL_TABLET | ORAL | 0 refills | Status: DC

## 2023-06-29 MED ORDER — ONDANSETRON HCL 4 MG/2ML IJ SOLN
4.0000 mg | Freq: Once | INTRAMUSCULAR | Status: AC
  Administered 2023-06-29: 4 mg via INTRAVENOUS
  Filled 2023-06-29: qty 2

## 2023-06-29 MED ORDER — AZITHROMYCIN 250 MG PO TABS
ORAL_TABLET | ORAL | 0 refills | Status: DC
  Filled 2023-06-29: qty 4, 4d supply, fill #0

## 2023-06-29 MED ORDER — FENTANYL CITRATE PF 50 MCG/ML IJ SOSY
25.0000 ug | PREFILLED_SYRINGE | Freq: Once | INTRAMUSCULAR | Status: AC
  Administered 2023-06-29: 25 ug via INTRAVENOUS
  Filled 2023-06-29: qty 1

## 2023-06-29 MED ORDER — LACTATED RINGERS IV BOLUS
1000.0000 mL | Freq: Once | INTRAVENOUS | Status: AC
  Administered 2023-06-29: 1000 mL via INTRAVENOUS

## 2023-06-29 MED ORDER — AZITHROMYCIN 250 MG PO TABS
250.0000 mg | ORAL_TABLET | Freq: Once | ORAL | Status: AC
  Administered 2023-06-29: 250 mg via ORAL
  Filled 2023-06-29: qty 1

## 2023-06-29 MED ORDER — SODIUM CHLORIDE 0.9 % IV SOLN
1.0000 g | Freq: Once | INTRAVENOUS | Status: AC
  Administered 2023-06-29: 1 g via INTRAVENOUS
  Filled 2023-06-29: qty 10

## 2023-06-29 NOTE — Discharge Instructions (Addendum)
 Follow up with your Physician.  Continue antibiotics

## 2023-06-29 NOTE — ED Provider Notes (Addendum)
 Patient's care assumed by me at 3:30 PM.  Patient is awaiting CT scanning of her chest for evaluation of PE.  Patient was seen yesterday and diagnosed with bronchitis and started on Zithromax.  Patient reports feeling worse today and being short of breath.  Patient's chest x-ray showed a possible infiltrate.  Patient has been given Rocephin IV and IV fluids. CT scan shows no evidence of PE.  Patient counseled on results.  Patient is very sleepy.  CT head is obtained which is normal.  Urine drug screen is positive for cocaine.  Orthostatic vital signs are obtained.  Patient's orthostatics are normal.  Patient is advised to continue current antibiotics.  Follow-up with primary care physician for recheck.   Elson Areas, PA-C 06/29/23 1921    Rozelle Logan, DO 06/29/23 2144    Elson Areas, PA-C 06/29/23 2256    Rozelle Logan, DO 06/29/23 2321

## 2023-06-29 NOTE — ED Provider Notes (Addendum)
 [place] EMERGENCY DEPARTMENT AT [facility] Provider Note   CSN: [id number] Arrival date & time: 06/29/23  1230     History  Chief Complaint  Patient presents with   Shortness of Breath   Emesis    Kelli Jenkins is a 63 y.o. female.  Pt complains of flulike symptoms.  Patient states she has been having fevers, nausea, vomiting, diarrhea, shortness of breath, and chest pain for the past 3 to 4 days.  Was seen to days ago for the same complaint was diagnosed with bronchitis and started on azithromycin.  She is taken 1 dose of the medication without improvement of symptoms.  Does not take anything else.  Does endorse some abdominal discomfort.   The history is provided by the patient. No language interpreter was used.  Emesis Associated symptoms: abdominal pain, cough and diarrhea   Associated symptoms: no chills and no fever        Home Medications Prior to Admission medications   Medication Sig Start Date End Date Taking? Authorizing Provider  albuterol (VENTOLIN HFA) 108 (90 Base) MCG/ACT inhaler Inhale 2 puffs into the lungs every 6 (six) hours as needed for wheezing or shortness of breath. 03/14/22   Almon Hercules, MD  albuterol (VENTOLIN HFA) 108 (90 Base) MCG/ACT inhaler Inhale 1-2 puffs into the lungs every 6 (six) hours as needed for wheezing or shortness of breath. 07/30/22   Curley Spice, MD  azithromycin (ZITHROMAX Z-PAK) 250 MG tablet Take 500 mg on day 1, then 250 mg once a day for days 2, 3, 4, and 5. 06/28/23   Cardama, Amadeo Garnet, MD  escitalopram (LEXAPRO) 5 MG tablet Take 1 tablet (5 mg total) by mouth daily. Patient not taking: Reported on 03/13/2022 09/28/20 10/28/20  Lauro Franklin, MD  gabapentin (NEURONTIN) 100 MG capsule Take 1 capsule (100 mg total) by mouth 3 (three) times daily. Patient not taking: Reported on 03/13/2022 09/27/20   Lauro Franklin, MD  pantoprazole (PROTONIX) 40 MG tablet Take 1 tablet (40 mg total) by mouth  daily. 03/14/22 04/13/22  Almon Hercules, MD      Allergies    Darvocet [propoxyphene n-acetaminophen], Ibuprofen, and Tramadol    Review of Systems   Review of Systems  Constitutional:  Negative for chills and fever.  Respiratory:  Positive for cough and shortness of breath.   Cardiovascular:  Positive for chest pain.  Gastrointestinal:  Positive for abdominal pain, diarrhea, nausea and vomiting.  Neurological:  Negative for light-headedness.  All other systems reviewed and are negative.   Physical Exam Updated Vital Signs BP 131/63   Pulse 92   Temp 99.7 F (37.6 C) (Oral)   Resp (!) 25   Ht 5\' 6"  (1.676 m)   Wt 101.2 kg   SpO2 96%   BMI 36.01 kg/m  Physical Exam Vitals and nursing note reviewed.  Constitutional:      General: She is not in acute distress.    Appearance: Normal appearance. She is not ill-appearing.  HENT:     Head: Normocephalic and atraumatic.     Nose: Nose normal.  Eyes:     General: No scleral icterus.    Extraocular Movements: Extraocular movements intact.     Conjunctiva/sclera: Conjunctivae normal.  Cardiovascular:     Rate and Rhythm: Normal rate and regular rhythm.     Pulses: Normal pulses.     Heart sounds: Normal heart sounds.  Pulmonary:     Effort: Pulmonary effort  is normal. No respiratory distress.     Breath sounds: Normal breath sounds. No wheezing.  Abdominal:     General: There is no distension.     Palpations: Abdomen is soft.     Tenderness: There is no abdominal tenderness. There is no guarding.  Musculoskeletal:        General: No deformity. Normal range of motion.     Cervical back: Normal range of motion.     Right lower leg: No edema.     Left lower leg: No edema.  Skin:    General: Skin is warm and dry.     Findings: No rash.  Neurological:     General: No focal deficit present.     Mental Status: She is alert. Mental status is at baseline.     ED Results / Procedures / Treatments   Labs (all labs ordered  are listed, but only abnormal results are displayed) Labs Reviewed  BASIC METABOLIC PANEL - Abnormal; Notable for the following components:      Result Value   CO2 21 (*)    Glucose, Bld 132 (*)    Calcium 8.7 (*)    All other components within normal limits  CBC - Abnormal; Notable for the following components:   WBC 14.0 (*)    All other components within normal limits  D-DIMER, QUANTITATIVE - Abnormal; Notable for the following components:   D-Dimer, Quant 0.69 (*)    All other components within normal limits  SARS CORONAVIRUS 2 BY RT PCR  TROPONIN I (HIGH SENSITIVITY)  TROPONIN I (HIGH SENSITIVITY)    EKG EKG Interpretation Date/Time:  Saturday June 29 2023 12:56:00 EST Ventricular Rate:  107 PR Interval:  154 QRS Duration:  74 QT Interval:  316 QTC Calculation: 421 R Axis:   87  Text Interpretation: Sinus tachycardia Cannot rule out Anterior infarct , age undetermined Abnormal ECG No significant change was found Confirmed by Elayne Snare ([id number]) on 06/29/2023 2:40:15 PM  Radiology DG Chest 1 View Result Date: 06/29/2023 CLINICAL DATA:  [id number] Chest pain [id number] EXAM: CHEST  1 VIEW COMPARISON:  June 28, 2023 FINDINGS: The cardiomediastinal silhouette is unchanged in contour. No pleural effusion. No pneumothorax. Mild peribronchial cuffing with basilar predominant bronchitic markings. There are reticulonodular opacities at the LEFT lung base. IMPRESSION: Constellation of findings could reflect underlying atypical infection versus small airways disease with superimposed atelectasis. Recommend follow-up PA and lateral chest radiograph in 4-6 weeks to assess for resolution. Electronically Signed   By: Meda Klinefelter M.D.   On: 06/29/2023 13:34   DG Chest 2 View Result Date: 06/28/2023 CLINICAL DATA:  Shortness of breath. EXAM: CHEST - 2 VIEW COMPARISON:  07/30/2022 FINDINGS: The lungs are clear without focal pneumonia, edema, pneumothorax or pleural effusion. The  cardiopericardial silhouette is within normal limits for size. No acute bony abnormality. Telemetry leads overlie the chest. IMPRESSION: No active cardiopulmonary disease. Electronically Signed   By: Kennith Center M.D.   On: 06/28/2023 05:38    Procedures Procedures    Medications Ordered in ED Medications  fentaNYL (SUBLIMAZE) injection 25 mcg (has no administration in time range)  lactated ringers bolus 1,000 mL (1,000 mLs Intravenous New Bag/Given 06/29/23 1452)  ondansetron (ZOFRAN) injection 4 mg (4 mg Intravenous Given 06/29/23 1453)    ED Course/ Medical Decision Making/ A&P  Medical Decision Making Amount and/or Complexity of Data Reviewed Labs: ordered. Radiology: ordered.  Risk OTC drugs. Prescription drug management.   Medical Decision Making / ED Course   This patient presents to the ED for concern of chest pain, abdominal pain, this involves an extensive number of treatment options, and is a complaint that carries with it a high risk of complications and morbidity.  The differential diagnosis includes gastroenteritis, ACS, PE, colitis, diverticulitis, viral URI  MDM: 63 year old female presents today for concern of above-mentioned complaints.  She does appear somewhat uncomfortable on exam. Through workup that was obtained in triage patient does have an elevated D-dimer.  Leukocytosis of 14,000.  Glucose 132 otherwise without acute concern.  Troponin negative.  COVID-negative. Chest x-ray shows some ambiguous findings.  Due to elevated D-dimer she will be further evaluated with CT angio chest.  Will add on CT abdomen pelvis due to her abdominal pain.  Patient pending CT PE study and abdomen and pelvis at the end of shift change.  Will sign out to oncoming provider.  Fluids, symptom management provided.  Patient later confirmed to have a fever.  Chest x-ray showed potential concern for pneumonia.  Will give her a dose of Rocephin and  Tylenol.   Additional history obtained: -Additional history obtained from chart review -External records from outside source obtained and reviewed including: Chart review including previous notes, labs, imaging, consultation notes   Lab Tests: -I ordered, reviewed, and interpreted labs.   The pertinent results include:   Labs Reviewed  BASIC METABOLIC PANEL - Abnormal; Notable for the following components:      Result Value   CO2 21 (*)    Glucose, Bld 132 (*)    Calcium 8.7 (*)    All other components within normal limits  CBC - Abnormal; Notable for the following components:   WBC 14.0 (*)    All other components within normal limits  D-DIMER, QUANTITATIVE - Abnormal; Notable for the following components:   D-Dimer, Quant 0.69 (*)    All other components within normal limits  SARS CORONAVIRUS 2 BY RT PCR  TROPONIN I (HIGH SENSITIVITY)  TROPONIN I (HIGH SENSITIVITY)      EKG  EKG Interpretation Date/Time:  Saturday June 29 2023 12:56:00 EST Ventricular Rate:  107 PR Interval:  154 QRS Duration:  74 QT Interval:  316 QTC Calculation: 421 R Axis:   87  Text Interpretation: Sinus tachycardia Cannot rule out Anterior infarct , age undetermined Abnormal ECG No significant change was found Confirmed by Elayne Snare ([id number]) on 06/29/2023 2:40:15 PM         Imaging Studies ordered: I ordered imaging studies including chest x-ray, CT angio chest PE study, CT abdomen pelvis obtained but not resulted in the end my shift. I independently visualized and interpreted imaging. I agree with the radiologist interpretation   Medicines ordered and prescription drug management: Meds ordered this encounter  Medications   lactated ringers bolus 1,000 mL   ondansetron (ZOFRAN) injection 4 mg   fentaNYL (SUBLIMAZE) injection 25 mcg    -I have reviewed the patients home medicines and have made adjustments as needed   Cardiac Monitoring: The patient was maintained on a  cardiac monitor.  I personally viewed and interpreted the cardiac monitored which showed an underlying rhythm of: Normal sinus rhythm   Reevaluation: After the interventions noted above, I reevaluated the patient and found that they have :stayed the same  Co morbidities that complicate the patient evaluation  Past Medical History:  Diagnosis Date   Cancer (HCC)    uterine   Cocaine addiction (HCC)    Hypokalemia    Nephrolithiasis    Pyelonephritis    SIRS (systemic inflammatory response syndrome) (HCC)    Tobacco abuse    UTI (urinary tract infection) 06/2013      Dispostion: Signed out to oncoming provider to follow-up on imaging.   Final Clinical Impression(s) / ED Diagnoses Final diagnoses:  Chest pain, unspecified type    Rx / DC Orders ED Discharge Orders     None         Marita Kansas, PA-C 06/29/23 1505    Marita [place], PA-C 06/29/23 1524    Theresia Lo [place] K, DO 06/29/23 1553

## 2023-06-29 NOTE — ED Notes (Signed)
 Pt ambulated in hallway, dizziness reported. PA made aware.

## 2023-06-29 NOTE — ED Provider Triage Note (Signed)
 Emergency Medicine Provider Triage Evaluation Note  Kelli Jenkins , a 63 y.o. female  was evaluated in triage.  Pt complains of flulike symptoms.  Patient states she has been having fevers, nausea, vomiting, diarrhea, shortness of breath, and chest pain for the past 3 to 4 days.  Was seen to days ago for the same complaint was diagnosed with bronchitis and started on azithromycin.  She is taken 1 dose of the medication without improvement of symptoms.  Does not take anything else.  Review of Systems  Positive: Fevers, CP, SOP Negative:   Physical Exam  BP 132/65 (BP Location: Left Arm)   Pulse 99   Temp 99.7 F (37.6 C) (Oral)   Resp 18   Ht 5\' 6"  (1.676 m)   Wt 101.2 kg   SpO2 100%   BMI 36.01 kg/m  Gen:   Awake, no distress   Resp:  Normal effort  MSK:   Moves extremities without difficulty  Other:    Medical Decision Making  Medically screening exam initiated at 12:53 PM.  Appropriate orders placed.  Kelli Jenkins was informed that the remainder of the evaluation will be completed by another provider, this initial triage assessment does not replace that evaluation, and the importance of remaining in the ED until their evaluation is complete.    Maxwell Marion, PA-C 06/29/23 [phone]

## 2023-06-29 NOTE — ED Triage Notes (Signed)
 Pt c/o midsternal chest pain, SOB and N/Vx3-4d.

## 2023-07-01 ENCOUNTER — Other Ambulatory Visit (HOSPITAL_COMMUNITY): Payer: Self-pay

## 2023-07-04 ENCOUNTER — Other Ambulatory Visit: Payer: Self-pay

## 2023-07-05 ENCOUNTER — Other Ambulatory Visit (HOSPITAL_COMMUNITY): Payer: Self-pay

## 2023-07-08 ENCOUNTER — Other Ambulatory Visit (HOSPITAL_BASED_OUTPATIENT_CLINIC_OR_DEPARTMENT_OTHER): Payer: Self-pay | Admitting: Emergency Medicine

## 2023-10-02 ENCOUNTER — Other Ambulatory Visit: Payer: Self-pay

## 2023-10-09 ENCOUNTER — Emergency Department (HOSPITAL_BASED_OUTPATIENT_CLINIC_OR_DEPARTMENT_OTHER)

## 2023-10-09 ENCOUNTER — Other Ambulatory Visit: Payer: Self-pay

## 2023-10-09 ENCOUNTER — Emergency Department (HOSPITAL_BASED_OUTPATIENT_CLINIC_OR_DEPARTMENT_OTHER)
Admission: EM | Admit: 2023-10-09 | Discharge: 2023-10-09 | Discharge status: Against Medical Advice | Attending: Emergency Medicine | Admitting: Emergency Medicine

## 2023-10-09 ENCOUNTER — Encounter (HOSPITAL_BASED_OUTPATIENT_CLINIC_OR_DEPARTMENT_OTHER): Payer: Self-pay | Admitting: Emergency Medicine

## 2023-10-09 DIAGNOSIS — R509 Fever, unspecified: Secondary | ICD-10-CM | POA: Diagnosis not present

## 2023-10-09 DIAGNOSIS — R1013 Epigastric pain: Secondary | ICD-10-CM | POA: Diagnosis not present

## 2023-10-09 DIAGNOSIS — Z5329 Procedure and treatment not carried out because of patient's decision for other reasons: Secondary | ICD-10-CM | POA: Insufficient documentation

## 2023-10-09 DIAGNOSIS — K920 Hematemesis: Secondary | ICD-10-CM | POA: Insufficient documentation

## 2023-10-09 DIAGNOSIS — Z5321 Procedure and treatment not carried out due to patient leaving prior to being seen by health care provider: Secondary | ICD-10-CM

## 2023-10-09 DIAGNOSIS — R531 Weakness: Secondary | ICD-10-CM | POA: Diagnosis not present

## 2023-10-09 DIAGNOSIS — R0602 Shortness of breath: Secondary | ICD-10-CM | POA: Insufficient documentation

## 2023-10-09 LAB — CBC
HCT: 40.4 % (ref 36.0–46.0)
Hemoglobin: 13.6 g/dL (ref 12.0–15.0)
MCH: 32 pg (ref 26.0–34.0)
MCHC: 33.7 g/dL (ref 30.0–36.0)
MCV: 95.1 fL (ref 80.0–100.0)
Platelets: 327 10*3/uL (ref 150–400)
RBC: 4.25 MIL/uL (ref 3.87–5.11)
RDW: 13.7 % (ref 11.5–15.5)
WBC: 13.2 10*3/uL — ABNORMAL HIGH (ref 4.0–10.5)
nRBC: 0 % (ref 0.0–0.2)

## 2023-10-09 LAB — BASIC METABOLIC PANEL WITH GFR
Anion gap: 13 (ref 5–15)
BUN: 19 mg/dL (ref 8–23)
CO2: 22 mmol/L (ref 22–32)
Calcium: 9.3 mg/dL (ref 8.9–10.3)
Chloride: 103 mmol/L (ref 98–111)
Creatinine, Ser: 0.92 mg/dL (ref 0.44–1.00)
GFR, Estimated: >60 mL/min (ref >60)
Glucose, Bld: 158 mg/dL — ABNORMAL HIGH (ref 70–99)
Potassium: 4.2 mmol/L (ref 3.5–5.1)
Sodium: 138 mmol/L (ref 135–145)

## 2023-10-09 LAB — TROPONIN T, HIGH SENSITIVITY: Troponin T High Sensitivity: <15 ng/L (ref <19)

## 2023-10-09 NOTE — ED Triage Notes (Signed)
 Pt POV steady gait- enters triage drinking large take out drink- c/o hematemesis x 3 weeks, epigastric pain x3 weeks.   Reports constipation, LBM 10/07/2023.  Reports last BM had red streaks.   Also c/o generalized weakness, L arm weakness, difficulty with ROM due to pain.   Pt reports fever yesterday of 102.0.  C/o ShOB x 2 years  Reports hx of ulcers-

## 2023-10-09 NOTE — ED Provider Notes (Addendum)
 [place] [facility] AT [facility] Provider Note   CSN: [id number] Arrival date & time: 10/09/23  1247     Patient presents with: Multiple complaints   Kelli Jenkins is a 63 y.o. female from chart review with h/o cocaine use, opoid use, SIRS, UTI presents to the ER today for evaluation. Nursing note mentions multiple complaints, but the patient reports that her stomach has been hurting for a while. When asking her how long a while was, she became hostile and said a while. When asking about three weeks that was mentioned in the nursing note, she said no it's been longer. The patient was somnolent, but would awaken to voice and respond. She was not cooperative with questioning and trying to obtain history and stated, I don't know what is wrong with me, you are supposed to know that. She reported that she didn't want to have to talk about it twice. I discussed with the patient the importance of asking and answering questions to find out what is going on so that we can order the appropriate workup, but the patient didn't want to cooperate    HPI     Prior to Admission medications   Medication Sig Start Date End Date Taking? Authorizing Provider  albuterol  (VENTOLIN  HFA) 108 (90 Base) MCG/ACT inhaler Inhale 2 puffs into the lungs every 6 (six) hours as needed for wheezing or shortness of breath. 03/14/22   Gonfa, Taye T, MD  albuterol  (VENTOLIN  HFA) 108 (90 Base) MCG/ACT inhaler Inhale 1-2 puffs into the lungs every 6 (six) hours as needed for wheezing or shortness of breath. 07/30/22   Rogelia Ivy, MD  azithromycin  (ZITHROMAX  Z-PAK) 250 MG tablet One tablet a day 06/29/23   Sofia, Leslie K, PA-C  escitalopram  (LEXAPRO ) 5 MG tablet Take 1 tablet (5 mg total) by mouth daily. Patient not taking: Reported on 03/13/2022 09/28/20 10/28/20  Pashayan, Alexander S, DO  gabapentin  (NEURONTIN ) 100 MG capsule Take 1 capsule (100 mg total) by mouth 3 (three) times  daily. Patient not taking: Reported on 03/13/2022 09/27/20   Raliegh Marsa RAMAN, DO  pantoprazole  (PROTONIX ) 40 MG tablet Take 1 tablet (40 mg total) by mouth daily. 03/14/22 04/13/22  Gonfa, Taye T, MD    Allergies: Darvocet [propoxyphene n-acetaminophen ], Ibuprofen , and Tramadol     Review of Systems  Unable to perform ROS: Other  Patient not participatory   Updated Vital Signs BP 119/68 (BP Location: Right Arm)   Pulse 93   Temp 98.2 F (36.8 C) (Oral)   Resp 18   Ht 5' 6 (1.676 m)   Wt 99.8 kg   SpO2 95%   BMI 35.51 kg/m   Physical Exam Vitals and nursing note reviewed.  Constitutional:      Comments: Somnolent, but awakens to voice easily. Not toxic appearing.    Eyes:     General: No scleral icterus.  Pulmonary:     Effort: Pulmonary effort is normal. No respiratory distress.   Neurological:     Gait: Gait normal.     Comments: Moving all extremities to push herself to a sitting up position.     (all labs ordered are listed, but only abnormal results are displayed) Labs Reviewed  BASIC METABOLIC PANEL WITH GFR - Abnormal; Notable for the following components:      Result Value   Glucose, Bld 158 (*)    All other components within normal limits  CBC - Abnormal; Notable for the following components:   WBC  13.2 (*)    All other components within normal limits  TROPONIN T, HIGH SENSITIVITY  TROPONIN T, HIGH SENSITIVITY    EKG: None  Radiology: No results found.  Procedures   Medications Ordered in the ED - No data to display  Clinical Course as of 10/09/23 1456  Wed Oct 09, 2023  1455 Patient eloped from the ER.  [RR]    Clinical Course User Index [RR] Bernis Ernst, PA-C   Medical Decision Making Amount and/or Complexity of Data Reviewed Labs: ordered. Radiology: ordered.   63 y.o. female presents to the ER for evaluation of multiple complaints. Vital signs unremarkable. Physical exam as noted above.   Patient was verbally aggressive  and hostile towards this Clinical research associate. She was not cooperative to obtaining a history and was an active barrier in her care. She wouldn't participate in questioning with things such as what are her symptoms and how long they have been going on because I don't know what is wrong with me, you are supposed to know that. I explained to her kindly that asking questions is how we can try to find out what was going on with her symptoms to allow us  to know what to order. The patient was falling asleep during encounter and was giving conflicting information and was asked to sit up multiple times. When restating the information for confirmation with the patient, the information would change. The patient stated I need my discharge papers you fucking bitch. I left the room concerned for safety. The patient walked out of the room independently with steady gait shortly after I left.   Portions of this report may have been transcribed using voice recognition software. Every effort was made to ensure accuracy; however, inadvertent computerized transcription errors may be present.     Final diagnoses:  Eloped from emergency department    ED Discharge Orders     None          Bernis Ernst, PA-C 10/11/23 1717    Bernis Ernst, PA-C 10/11/23 1717    Darra Fonda MATSU, MD 10/18/23 [phone]

## 2024-02-24 ENCOUNTER — Encounter (HOSPITAL_COMMUNITY): Payer: Self-pay

## 2024-02-24 ENCOUNTER — Emergency Department (HOSPITAL_COMMUNITY)
Admission: EM | Admit: 2024-02-24 | Discharge: 2024-02-24 | Disposition: A | Discharge status: Home / Self Care | Attending: Emergency Medicine | Admitting: Emergency Medicine

## 2024-02-24 ENCOUNTER — Other Ambulatory Visit: Payer: Self-pay

## 2024-02-24 ENCOUNTER — Emergency Department (HOSPITAL_COMMUNITY)

## 2024-02-24 DIAGNOSIS — R1013 Epigastric pain: Secondary | ICD-10-CM | POA: Diagnosis present

## 2024-02-24 DIAGNOSIS — D72829 Elevated white blood cell count, unspecified: Secondary | ICD-10-CM | POA: Insufficient documentation

## 2024-02-24 DIAGNOSIS — K2960 Other gastritis without bleeding: Secondary | ICD-10-CM | POA: Diagnosis not present

## 2024-02-24 DIAGNOSIS — J3089 Other allergic rhinitis: Secondary | ICD-10-CM | POA: Diagnosis not present

## 2024-02-24 LAB — CBC
HCT: 38 % (ref 36.0–46.0)
Hemoglobin: 12.4 g/dL (ref 12.0–15.0)
MCH: 31.4 pg (ref 26.0–34.0)
MCHC: 32.6 g/dL (ref 30.0–36.0)
MCV: 96.2 fL (ref 80.0–100.0)
Platelets: 299 K/uL (ref 150–400)
RBC: 3.95 MIL/uL (ref 3.87–5.11)
RDW: 13.3 % (ref 11.5–15.5)
WBC: 11.6 K/uL — ABNORMAL HIGH (ref 4.0–10.5)
nRBC: 0 % (ref 0.0–0.2)

## 2024-02-24 LAB — RESP PANEL BY RT-PCR (RSV, FLU A&B, COVID)  RVPGX2
Influenza A by PCR: NEGATIVE
Influenza B by PCR: NEGATIVE
Resp Syncytial Virus by PCR: NEGATIVE
SARS Coronavirus 2 by RT PCR: NEGATIVE

## 2024-02-24 LAB — BASIC METABOLIC PANEL WITH GFR
Anion gap: 10 (ref 5–15)
BUN: 16 mg/dL (ref 8–23)
CO2: 25 mmol/L (ref 22–32)
Calcium: 9.3 mg/dL (ref 8.9–10.3)
Chloride: 104 mmol/L (ref 98–111)
Creatinine, Ser: 0.76 mg/dL (ref 0.44–1.00)
GFR, Estimated: >60 mL/min (ref >60)
Glucose, Bld: 145 mg/dL — ABNORMAL HIGH (ref 70–99)
Potassium: 4 mmol/L (ref 3.5–5.1)
Sodium: 139 mmol/L (ref 135–145)

## 2024-02-24 LAB — TROPONIN T, HIGH SENSITIVITY: Troponin T High Sensitivity: <15 ng/L (ref 0–19)

## 2024-02-24 LAB — GROUP A STREP BY PCR: Group A Strep by PCR: NOT DETECTED

## 2024-02-24 MED ORDER — SUCRALFATE 1 G PO TABS: 1.0000 g | ORAL_TABLET | Freq: Three times a day (TID) | ORAL | 1 refills | Status: AC

## 2024-02-24 MED ORDER — SODIUM CHLORIDE 0.9 % IV BOLUS
1000.0000 mL | Freq: Once | INTRAVENOUS | Status: AC
  Administered 2024-02-24: 1000 mL via INTRAVENOUS

## 2024-02-24 MED ORDER — FLUTICASONE PROPIONATE 50 MCG/ACT NA SUSP
2.0000 | Freq: Every day | NASAL | 2 refills | Status: AC
Start: 2024-02-24 — End: ?

## 2024-02-24 NOTE — ED Triage Notes (Signed)
 PT arrives via POV. PT c/o cough, congestion, and a sore throat for the past few days. She also reports constant chest pain as well. States she does not believe it is from the cough. Reports cocaine use a few days ago. She is AxOx4.

## 2024-02-24 NOTE — ED Notes (Signed)
 Pt stating that she wants to be discharged. Provider notified

## 2024-02-24 NOTE — Discharge Instructions (Addendum)
 As discussed, you will need to follow-up with a GI specialist regarding your likely ulcers/gastritis.  Further regarding your cough, it is best if you stop smoking, visit with your primary care for smoking cessation, nor if that your next visit you are open to this this can be provided to you at that time.

## 2024-02-24 NOTE — ED Provider Notes (Signed)
  EMERGENCY DEPARTMENT AT [facility] Provider Note   CSN: [id number] Arrival date & time: 02/24/24  9195     Patient presents with: Chest Pain, Cough, Nasal Congestion, and Sore Throat   Kelli Jenkins is a 63 y.o. female who presents secondary to a various amount of complaints however primarily is complaining of epigastric discomfort, as well as sore throat and congestion over the last several days.  Of note, she endorses epigastric discomfort that increases with food intake, known history of gastric ulcers, continues to use ibuprofen  and other NSAIDs for pain control.  States that she also takes omeprazole  with some reduction of her pain.  She further endorses fatigue, along with sore throat and difficulty speaking over the last several days.  No new body aches, does state that she has chronic pain, described as pain across her entire body with no specific increased pain in any specific joint.  Further    Chest Pain Associated symptoms: cough   Cough Associated symptoms: chest pain   Sore Throat Associated symptoms include chest pain.       Prior to Admission medications   Medication Sig Start Date End Date Taking? Authorizing Provider  fluticasone (FLONASE) 50 MCG/ACT nasal spray Place 2 sprays into both nostrils daily. 02/24/24  Yes Myriam Dorn BROCKS, PA  sucralfate  (CARAFATE ) 1 g tablet Take 1 tablet (1 g total) by mouth 4 (four) times daily -  with meals and at bedtime. 02/24/24  Yes Myriam Dorn BROCKS, PA  albuterol  (VENTOLIN  HFA) 108 (90 Base) MCG/ACT inhaler Inhale 2 puffs into the lungs every 6 (six) hours as needed for wheezing or shortness of breath. 03/14/22   Gonfa, Taye T, MD  albuterol  (VENTOLIN  HFA) 108 (90 Base) MCG/ACT inhaler Inhale 1-2 puffs into the lungs every 6 (six) hours as needed for wheezing or shortness of breath. 07/30/22   Rogelia Jerilynn RAMAN, MD  azithromycin  (ZITHROMAX  Z-PAK) 250 MG tablet One tablet a day 06/29/23   Sofia,  Leslie K, PA-C  escitalopram  (LEXAPRO ) 5 MG tablet Take 1 tablet (5 mg total) by mouth daily. Patient not taking: Reported on 03/13/2022 09/28/20 10/28/20  Pashayan, Alexander S, DO  gabapentin  (NEURONTIN ) 100 MG capsule Take 1 capsule (100 mg total) by mouth 3 (three) times daily. Patient not taking: Reported on 03/13/2022 09/27/20   Raliegh Marsa RAMAN, DO  pantoprazole  (PROTONIX ) 40 MG tablet Take 1 tablet (40 mg total) by mouth daily. 03/14/22 04/13/22  Gonfa, Taye T, MD    Allergies: Darvocet [propoxyphene n-acetaminophen ], Ibuprofen , and Tramadol     Review of Systems  Respiratory:  Positive for cough.   Cardiovascular:  Positive for chest pain.    Updated Vital Signs BP 130/87   Pulse 76   Temp 98 F (36.7 C)   Resp 17   SpO2 98%   Physical Exam  (all labs ordered are listed, but only abnormal results are displayed) Labs Reviewed  BASIC METABOLIC PANEL WITH GFR - Abnormal; Notable for the following components:      Result Value   Glucose, Bld 145 (*)    All other components within normal limits  CBC - Abnormal; Notable for the following components:   WBC 11.6 (*)    All other components within normal limits  GROUP A STREP BY PCR  RESP PANEL BY RT-PCR (RSV, FLU A&B, COVID)  [id number]  TROPONIN T, HIGH SENSITIVITY  TROPONIN T, HIGH SENSITIVITY    EKG: None  Radiology: DG Chest 2 View Result Date:  02/24/2024 EXAM: 2 VIEW(S) XRAY OF THE CHEST 02/24/2024 08:27:45 AM COMPARISON: 10/09/2023 CLINICAL HISTORY: chest pain, intermittent smoker. FINDINGS: LUNGS AND PLEURA: Bilateral increased pulmonary interstitial markings are stable, likely smoking related . No focal pulmonary opacity. No pulmonary edema. No pleural effusion. No pneumothorax. HEART AND MEDIASTINUM: No acute abnormality of the cardiac and mediastinal silhouettes. BONES AND SOFT TISSUES: No acute osseous abnormality. IMPRESSION: 1. No acute cardiopulmonary process. Electronically signed by: Helayne Hurst MD 02/24/2024  08:35 AM EST RP Workstation: [id number]     Procedures   Medications Ordered in the ED  sodium chloride  0.9 % bolus 1,000 mL (0 mLs Intravenous Stopped 02/24/24 1128)                                    Medical Decision Making Amount and/or Complexity of Data Reviewed Labs: ordered. Radiology: ordered.  Risk Prescription drug management.   Medical Decision Making:   Kelli Jenkins is a 63 y.o. female who presented to the ED today with cough, congestion, sore throat detailed above.     Complete initial physical exam performed, notably the patient  was alert and oriented in no apparent distress.  Voice is hoarse otherwise no concerning exam findings on exam.  Oral mucosa are mildly dry..    Reviewed and confirmed nursing documentation for past medical history, family history, social history.    Initial Assessment:   With the patient's presentation of cough, congestion, sore throat, viral pharyngitis also strep pharyngitis.  Further consider possible upper respiratory infection to include viral etiology of COVID/flu/RSV.  Further, secondary shortness of breath, consider cardiac etiology of her shortness of breath and fatigue.   Initial Plan:  Obtain strep swab as well as nasopharyngeal swab for viral etiology rule out. Screening labs including CBC and Metabolic panel to evaluate for infectious or metabolic etiology of disease.  CXR to evaluate for structural/infectious intrathoracic pathology.  EKG to evaluate for cardiac pathology Objective evaluation as below reviewed   Initial Study Results:   Laboratory  All laboratory results reviewed without evidence of clinically relevant pathology.   Exceptions include: Mild leukocytosis of 11.6.  EKG EKG was reviewed independently. Rate, rhythm, axis, intervals all examined and without medically relevant abnormality. ST segments without concerns for elevations.    Radiology:  All images reviewed independently. Agree with  radiology report at this time.   DG Chest 2 View Result Date: 02/24/2024 EXAM: 2 VIEW(S) XRAY OF THE CHEST 02/24/2024 08:27:45 AM COMPARISON: 10/09/2023 CLINICAL HISTORY: chest pain, intermittent smoker. FINDINGS: LUNGS AND PLEURA: Bilateral increased pulmonary interstitial markings are stable, likely smoking related . No focal pulmonary opacity. No pulmonary edema. No pleural effusion. No pneumothorax. HEART AND MEDIASTINUM: No acute abnormality of the cardiac and mediastinal silhouettes. BONES AND SOFT TISSUES: No acute osseous abnormality. IMPRESSION: 1. No acute cardiopulmonary process. Electronically signed by: Helayne Hurst MD 02/24/2024 08:35 AM EST RP Workstation: [id number]      Reassessment and Plan:   Initial troponin is within normal limits, however patient wanted discharge prior to completion of delta troponin as she stated that she felt somewhat better but was ready for discharge as she felt that she was likely be provided with any other care here in the ED.  Discussed with the patient that pain with eating is consistent with gastric ulceration, given that she is only using a PPI at present will add on sulcal fate and refer to GI  for further evaluation.  Further further can nasal congestion fine this is consistent with likely an allergic rhinitis and will prescribe her a course of fluticasone nasal spray to assist in managing this.  Further discussed use of nasal saline, as well as smoking cessation.  Patient is not open to smoking cessation at this time however states that she will consider it and talk with someone in the future about this.  Given the patient's desire to be discharged, at this time we will discharge with outpatient follow-up as previously noted.       Final diagnoses:  Reflux gastritis  Non-seasonal allergic rhinitis, unspecified trigger    ED Discharge Orders          Ordered    sucralfate  (CARAFATE ) 1 g tablet  3 times daily with meals & bedtime        02/24/24  1140    fluticasone (FLONASE) 50 MCG/ACT nasal spray  Daily        02/24/24 1140               Myriam Dorn BROCKS, [place] 02/24/24 1617    Levander Houston, MD 03/03/24 1334

## 2024-03-15 ENCOUNTER — Encounter (HOSPITAL_COMMUNITY): Payer: Self-pay

## 2024-03-15 ENCOUNTER — Other Ambulatory Visit: Payer: Self-pay

## 2024-03-15 ENCOUNTER — Emergency Department (HOSPITAL_COMMUNITY)
Admission: EM | Admit: 2024-03-15 | Discharge: 2024-03-15 | Discharge status: Against Medical Advice | Attending: Emergency Medicine | Admitting: Emergency Medicine

## 2024-03-15 DIAGNOSIS — R109 Unspecified abdominal pain: Secondary | ICD-10-CM | POA: Diagnosis present

## 2024-03-15 DIAGNOSIS — Z5329 Procedure and treatment not carried out because of patient's decision for other reasons: Secondary | ICD-10-CM | POA: Diagnosis not present

## 2024-03-15 DIAGNOSIS — R1013 Epigastric pain: Secondary | ICD-10-CM | POA: Diagnosis not present

## 2024-03-15 MED ORDER — PANTOPRAZOLE SODIUM 40 MG IV SOLR: 40.0000 mg | Freq: Once | INTRAVENOUS | Status: DC

## 2024-03-15 MED ORDER — MORPHINE SULFATE (PF) 4 MG/ML IV SOLN: 4.0000 mg | Freq: Once | INTRAVENOUS | Status: DC

## 2024-03-15 MED ORDER — ONDANSETRON HCL 4 MG/2ML IJ SOLN
4.0000 mg | Freq: Once | INTRAMUSCULAR | Status: DC
Start: 2024-03-15 — End: 2024-03-16

## 2024-03-15 NOTE — ED Notes (Signed)
 Pt stated she is leaving due to leaving a burner on at home.

## 2024-03-15 NOTE — ED Provider Notes (Signed)
 [place] EMERGENCY DEPARTMENT AT [facility] Provider Note   CSN: [id number] Arrival date & time: 03/15/24  2038     Patient presents with: Abdominal Pain   Kelli Jenkins is a 63 y.o. female.    Abdominal Pain    Patient has a history of hypokalemia cocaine addiction kidney stones pyelonephritis.  Patient presents emergency room for evaluation of abdominal pain.  Patient states this has been ongoing for months.  The pain is mostly in her upper abdomen in the midline.  Patient states she has noticed some blood in her stool intermittently.  She has been taking acid reflux medications without relief.  She has not seen anyone for this until this evening  Prior to Admission medications   Medication Sig Start Date End Date Taking? Authorizing Provider  albuterol  (VENTOLIN  HFA) 108 (90 Base) MCG/ACT inhaler Inhale 2 puffs into the lungs every 6 (six) hours as needed for wheezing or shortness of breath. 03/14/22   Gonfa, Taye T, MD  albuterol  (VENTOLIN  HFA) 108 (90 Base) MCG/ACT inhaler Inhale 1-2 puffs into the lungs every 6 (six) hours as needed for wheezing or shortness of breath. 07/30/22   Rogelia Jerilynn RAMAN, MD  azithromycin  (ZITHROMAX  Z-PAK) 250 MG tablet One tablet a day 06/29/23   Sofia, Leslie K, PA-C  escitalopram  (LEXAPRO ) 5 MG tablet Take 1 tablet (5 mg total) by mouth daily. Patient not taking: Reported on 03/13/2022 09/28/20 10/28/20  Pashayan, Alexander S, DO  fluticasone  (FLONASE ) 50 MCG/ACT nasal spray Place 2 sprays into both nostrils daily. 02/24/24   Myriam Dorn BROCKS, PA  gabapentin  (NEURONTIN ) 100 MG capsule Take 1 capsule (100 mg total) by mouth 3 (three) times daily. Patient not taking: Reported on 03/13/2022 09/27/20   Raliegh Marsa RAMAN, DO  pantoprazole  (PROTONIX ) 40 MG tablet Take 1 tablet (40 mg total) by mouth daily. 03/14/22 04/13/22  Gonfa, Taye T, MD  sucralfate  (CARAFATE ) 1 g tablet Take 1 tablet (1 g total) by mouth 4 (four) times daily -   with meals and at bedtime. 02/24/24   Myriam Dorn BROCKS, PA    Allergies: Darvocet [propoxyphene n-acetaminophen ], Ibuprofen , and Tramadol     Review of Systems  Gastrointestinal:  Positive for abdominal pain.    Updated Vital Signs BP (!) 151/82   Pulse 88   Temp 98.2 F (36.8 C)   Resp 20   SpO2 95%   Physical Exam Vitals and nursing note reviewed.  Constitutional:      General: She is not in acute distress.    Appearance: She is well-developed.  HENT:     Head: Normocephalic and atraumatic.     Right Ear: External ear normal.     Left Ear: External ear normal.  Eyes:     General: No scleral icterus.       Right eye: No discharge.        Left eye: No discharge.     Conjunctiva/sclera: Conjunctivae normal.  Neck:     Trachea: No tracheal deviation.  Cardiovascular:     Rate and Rhythm: Normal rate and regular rhythm.  Pulmonary:     Effort: Pulmonary effort is normal. No respiratory distress.     Breath sounds: Normal breath sounds. No stridor. No wheezing or rales.  Abdominal:     General: Bowel sounds are normal. There is no distension.     Palpations: Abdomen is soft.     Tenderness: There is abdominal tenderness in the epigastric area. There is no guarding  or rebound.  Musculoskeletal:        General: No tenderness or deformity.     Cervical back: Neck supple.  Skin:    General: Skin is warm and dry.     Findings: No rash.  Neurological:     General: No focal deficit present.     Mental Status: She is alert.     Cranial Nerves: No cranial nerve deficit, dysarthria or facial asymmetry.     Sensory: No sensory deficit.     Motor: No abnormal muscle tone or seizure activity.     Coordination: Coordination normal.  Psychiatric:        Mood and Affect: Mood normal.     (all labs ordered are listed, but only abnormal results are displayed) Labs Reviewed  COMPREHENSIVE METABOLIC PANEL WITH GFR  LIPASE, BLOOD  CBC WITH DIFFERENTIAL/PLATELET  URINALYSIS,  ROUTINE W REFLEX MICROSCOPIC  TROPONIN T, HIGH SENSITIVITY    EKG: None  Radiology: No results found.   Procedures   Medications Ordered in the ED  pantoprazole  (PROTONIX ) injection 40 mg (has no administration in time range)  morphine  (PF) 4 MG/ML injection 4 mg (has no administration in time range)  ondansetron  (ZOFRAN ) injection 4 mg (has no administration in time range)                                    Medical Decision Making Amount and/or Complexity of Data Reviewed Labs: ordered.  Risk Prescription drug management.   Patient presented to the emergency room for evaluation of abdominal pain.  Patient complained of pain upper abdomen.  Concerned about the possibility of cholecystitis pancreatitis, gastritis.  Plan was for laboratory testing and possible ultrasound for CT scan imaging.  I ordered IV fluids and medications for pain.  I was notified shortly after my examination that the patient needed to leave.  She was not given any medications.  She did not have any blood test collected.  Patient was concerned that she left the burner on at her house patient stated she would return     Final diagnoses:  Epigastric pain    ED Discharge Orders     None          Randol Simmonds, MD 03/15/24 2128

## 2024-03-15 NOTE — ED Triage Notes (Signed)
 Pt reports with upper abdominal pain that feels like burning and stabbing x 2 days. Pt states that she gets a big boom in her heart sometimes and has bleeding in her stool sometimes that is dark. Pt reports taking her acid reflux medication.

## 2024-03-15 NOTE — ED Notes (Signed)
 Patient rushed out stating she left her burner in her trailer. States she had to leave but would come back. MD made aware.

## 2024-03-26 ENCOUNTER — Other Ambulatory Visit: Payer: Self-pay

## 2024-03-26 ENCOUNTER — Emergency Department (HOSPITAL_BASED_OUTPATIENT_CLINIC_OR_DEPARTMENT_OTHER)
Admission: EM | Admit: 2024-03-26 | Discharge: 2024-03-27 | Disposition: A | Attending: Emergency Medicine | Admitting: Emergency Medicine

## 2024-03-26 ENCOUNTER — Encounter (HOSPITAL_BASED_OUTPATIENT_CLINIC_OR_DEPARTMENT_OTHER): Payer: Self-pay | Admitting: Emergency Medicine

## 2024-03-26 ENCOUNTER — Emergency Department (HOSPITAL_BASED_OUTPATIENT_CLINIC_OR_DEPARTMENT_OTHER): Admitting: Radiology

## 2024-03-26 DIAGNOSIS — J22 Unspecified acute lower respiratory infection: Secondary | ICD-10-CM | POA: Insufficient documentation

## 2024-03-26 MED ORDER — ACETAMINOPHEN 325 MG PO TABS
650.0000 mg | ORAL_TABLET | Freq: Once | ORAL | Status: AC
  Administered 2024-03-26: 650 mg via ORAL
  Filled 2024-03-26: qty 2

## 2024-03-26 NOTE — ED Triage Notes (Signed)
  Patient comes in with cough and nasal congestion that has been going on for a couple days.  Denies any sick contacts.  States she had fever of 102 last night.  Has been taking Excedrin  and dayquil but has not had any within the last 24 hours.  Lung sounds clear.  Afebrile in triage.  Pain 9/10, body aches.

## 2024-03-27 ENCOUNTER — Other Ambulatory Visit (HOSPITAL_COMMUNITY): Payer: Self-pay

## 2024-03-27 LAB — RESP PANEL BY RT-PCR (RSV, FLU A&B, COVID)  RVPGX2
Influenza A by PCR: NEGATIVE
Influenza B by PCR: NEGATIVE
Resp Syncytial Virus by PCR: NEGATIVE
SARS Coronavirus 2 by RT PCR: NEGATIVE

## 2024-03-27 MED ORDER — DOXYCYCLINE HYCLATE 100 MG PO CAPS
100.0000 mg | ORAL_CAPSULE | Freq: Two times a day (BID) | ORAL | 0 refills | Status: AC
  Filled 2024-03-27: qty 20, 10d supply, fill #0

## 2024-03-27 MED ORDER — DOXYCYCLINE HYCLATE 100 MG PO TABS
100.0000 mg | ORAL_TABLET | Freq: Once | ORAL | Status: AC
  Administered 2024-03-27: 100 mg via ORAL
  Filled 2024-03-27: qty 1

## 2024-03-27 NOTE — ED Provider Notes (Signed)
 [place] [facility] AT [facility]  Provider Note  CSN: [id number] Arrival date & time: 03/26/24 2259  History Chief Complaint  Patient presents with   Cough   Nasal Congestion    Kelli Jenkins is a 63 y.o. female with history of substance abuse and pneumonia here for 2 days of cough and congestion, reported fever at home.    Home Medications Prior to Admission medications   Medication Sig Start Date End Date Taking? Authorizing Provider  doxycycline  (VIBRAMYCIN ) 100 MG capsule Take 1 capsule (100 mg total) by mouth 2 (two) times daily. 03/27/24  Yes Roselyn Carlin NOVAK, MD  albuterol  (VENTOLIN  HFA) 108 [id number] Base) MCG/ACT inhaler Inhale 2 puffs into the lungs every 6 (six) hours as needed for wheezing or shortness of breath. 03/14/22   Gonfa, Taye T, MD  albuterol  (VENTOLIN  HFA) 108 (90 Base) MCG/ACT inhaler Inhale 1-2 puffs into the lungs every 6 (six) hours as needed for wheezing or shortness of breath. 07/30/22   Rogelia Jerilynn RAMAN, MD  azithromycin  (ZITHROMAX  Z-PAK) 250 MG tablet One tablet a day 06/29/23   Sofia, Leslie K, PA-C  escitalopram  (LEXAPRO ) 5 MG tablet Take 1 tablet (5 mg total) by mouth daily. Patient not taking: Reported on 03/13/2022 09/28/20 10/28/20  Raliegh Marsa RAMAN, DO  fluticasone  (FLONASE ) 50 MCG/ACT nasal spray Place 2 sprays into both nostrils daily. 02/24/24   Myriam Dorn BROCKS, PA  gabapentin  (NEURONTIN ) 100 MG capsule Take 1 capsule (100 mg total) by mouth 3 (three) times daily. Patient not taking: Reported on 03/13/2022 09/27/20   Raliegh Marsa RAMAN, DO  pantoprazole  (PROTONIX ) 40 MG tablet Take 1 tablet (40 mg total) by mouth daily. 03/14/22 04/13/22  Gonfa, Taye T, MD  sucralfate  (CARAFATE ) 1 g tablet Take 1 tablet (1 g total) by mouth 4 (four) times daily -  with meals and at bedtime. 02/24/24   Myriam Dorn BROCKS, PA     Allergies    Darvocet [propoxyphene n-acetaminophen ], Ibuprofen , and Tramadol    Review of Systems    Review of Systems Please see HPI for pertinent positives and negatives  Physical Exam BP 106/71   Pulse 81   Temp 98.2 F (36.8 C) (Oral)   Resp 18   Ht 5' 6 (1.676 m)   Wt 99.8 kg   SpO2 96%   BMI 35.51 kg/m   Physical Exam Vitals and nursing note reviewed.  Constitutional:      Appearance: Normal appearance.     Comments: Sleepy but awakens to voice  HENT:     Head: Normocephalic and atraumatic.     Nose: Nose normal.     Mouth/Throat:     Mouth: Mucous membranes are moist.  Eyes:     Extraocular Movements: Extraocular movements intact.     Conjunctiva/sclera: Conjunctivae normal.  Cardiovascular:     Rate and Rhythm: Normal rate.  Pulmonary:     Effort: Pulmonary effort is normal.     Breath sounds: Normal breath sounds. No wheezing or rales.  Abdominal:     General: Abdomen is flat.     Palpations: Abdomen is soft.     Tenderness: There is no abdominal tenderness.  Musculoskeletal:        General: No swelling. Normal range of motion.     Cervical back: Neck supple.  Skin:    General: Skin is warm and dry.  Neurological:     General: No focal deficit present.  Psychiatric:  Mood and Affect: Mood normal.     ED Results / Procedures / Treatments   EKG None  Procedures Procedures  Medications Ordered in the ED Medications  doxycycline  (VIBRA -TABS) tablet 100 mg (has no administration in time range)  acetaminophen  (TYLENOL ) tablet 650 mg (650 mg Oral Given 03/26/24 2314)    Initial Impression and Plan  Patient here for URI symptoms, overall exam and vitals are reassuring. Covid/Flu/RSV swab is neg. I personally viewed the images from radiology studies and agree with radiologist interpretation: CXR concerning for early infiltrate. Given symptoms and history, will begin a course of oral Abx, no indication for admission.   ED Course       MDM Rules/Calculators/A&P Medical Decision Making Problems Addressed: Lower respiratory infection:  acute illness or injury  Amount and/or Complexity of Data Reviewed Labs: ordered. Decision-making details documented in ED Course. Radiology: ordered and independent interpretation performed. Decision-making details documented in ED Course.  Risk OTC drugs. Prescription drug management.     Final Clinical Impression(s) / ED Diagnoses Final diagnoses:  Lower respiratory infection    Rx / DC Orders ED Discharge Orders          Ordered    doxycycline  (VIBRAMYCIN ) 100 MG capsule  2 times daily        03/27/24 0223             Roselyn Carlin NOVAK, MD 03/27/24 [phone]

## 2024-03-28 ENCOUNTER — Other Ambulatory Visit (HOSPITAL_COMMUNITY): Payer: Self-pay

## 2024-04-16 ENCOUNTER — Emergency Department (HOSPITAL_COMMUNITY)

## 2024-04-16 ENCOUNTER — Emergency Department (HOSPITAL_COMMUNITY)
Admission: EM | Admit: 2024-04-16 | Discharge: 2024-04-16 | Disposition: A | Discharge status: Home / Self Care | Attending: Emergency Medicine | Admitting: Emergency Medicine

## 2024-04-16 ENCOUNTER — Other Ambulatory Visit: Payer: Self-pay

## 2024-04-16 DIAGNOSIS — J40 Bronchitis, not specified as acute or chronic: Secondary | ICD-10-CM

## 2024-04-16 DIAGNOSIS — R918 Other nonspecific abnormal finding of lung field: Secondary | ICD-10-CM | POA: Insufficient documentation

## 2024-04-16 DIAGNOSIS — R059 Cough, unspecified: Secondary | ICD-10-CM | POA: Diagnosis present

## 2024-04-16 DIAGNOSIS — J209 Acute bronchitis, unspecified: Secondary | ICD-10-CM | POA: Diagnosis not present

## 2024-04-16 LAB — CBC WITH DIFFERENTIAL/PLATELET
Abs Immature Granulocytes: 0.04 K/uL (ref 0.00–0.07)
Basophils Absolute: 0.1 K/uL (ref 0.0–0.1)
Basophils Relative: 1 %
Eosinophils Absolute: 0.2 K/uL (ref 0.0–0.5)
Eosinophils Relative: 2 %
HCT: 42.2 % (ref 36.0–46.0)
Hemoglobin: 14.2 g/dL (ref 12.0–15.0)
Immature Granulocytes: 0 %
Lymphocytes Relative: 19 %
Lymphs Abs: 2.3 K/uL (ref 0.7–4.0)
MCH: 32.1 pg (ref 26.0–34.0)
MCHC: 33.6 g/dL (ref 30.0–36.0)
MCV: 95.3 fL (ref 80.0–100.0)
Monocytes Absolute: 0.6 K/uL (ref 0.1–1.0)
Monocytes Relative: 5 %
Neutro Abs: 8.9 K/uL — ABNORMAL HIGH (ref 1.7–7.7)
Neutrophils Relative %: 73 %
Platelets: 341 K/uL (ref 150–400)
RBC: 4.43 MIL/uL (ref 3.87–5.11)
RDW: 13.1 % (ref 11.5–15.5)
WBC: 12.1 K/uL — ABNORMAL HIGH (ref 4.0–10.5)
nRBC: 0 % (ref 0.0–0.2)

## 2024-04-16 LAB — COMPREHENSIVE METABOLIC PANEL WITH GFR
ALT: 12 U/L (ref 0–44)
AST: 18 U/L (ref 15–41)
Albumin: 4.1 g/dL (ref 3.5–5.0)
Alkaline Phosphatase: 92 U/L (ref 38–126)
Anion gap: 10 (ref 5–15)
BUN: 15 mg/dL (ref 8–23)
CO2: 25 mmol/L (ref 22–32)
Calcium: 9.2 mg/dL (ref 8.9–10.3)
Chloride: 104 mmol/L (ref 98–111)
Creatinine, Ser: 0.84 mg/dL (ref 0.44–1.00)
GFR, Estimated: >60 mL/min
Glucose, Bld: 132 mg/dL — ABNORMAL HIGH (ref 70–99)
Potassium: 4.2 mmol/L (ref 3.5–5.1)
Sodium: 138 mmol/L (ref 135–145)
Total Bilirubin: 0.3 mg/dL (ref 0.0–1.2)
Total Protein: 7.3 g/dL (ref 6.5–8.1)

## 2024-04-16 LAB — RESP PANEL BY RT-PCR (RSV, FLU A&B, COVID)  RVPGX2
Influenza A by PCR: NEGATIVE
Influenza B by PCR: NEGATIVE
Resp Syncytial Virus by PCR: NEGATIVE
SARS Coronavirus 2 by RT PCR: NEGATIVE

## 2024-04-16 MED ORDER — ALBUTEROL SULFATE HFA 108 (90 BASE) MCG/ACT IN AERS
1.0000 | INHALATION_SPRAY | Freq: Once | RESPIRATORY_TRACT | Status: AC
  Administered 2024-04-16: 1 via RESPIRATORY_TRACT
  Filled 2024-04-16: qty 6.7

## 2024-04-16 MED ORDER — PREDNISONE 20 MG PO TABS
40.0000 mg | ORAL_TABLET | Freq: Every day | ORAL | 0 refills | Status: AC
  Filled 2024-04-16: qty 10, 5d supply, fill #0

## 2024-04-16 MED ORDER — AZITHROMYCIN 250 MG PO TABS
250.0000 mg | ORAL_TABLET | Freq: Every day | ORAL | 0 refills | Status: AC
  Filled 2024-04-16: qty 6, 6d supply, fill #0

## 2024-04-16 MED ORDER — AMOXICILLIN-POT CLAVULANATE 875-125 MG PO TABS
1.0000 | ORAL_TABLET | Freq: Two times a day (BID) | ORAL | 0 refills | Status: AC
  Filled 2024-04-16: qty 14, 7d supply, fill #0

## 2024-04-16 MED ORDER — IOHEXOL 350 MG/ML SOLN
75.0000 mL | Freq: Once | INTRAVENOUS | Status: AC | PRN
  Administered 2024-04-16: 75 mL via INTRAVENOUS

## 2024-04-16 MED ORDER — IPRATROPIUM-ALBUTEROL 0.5-2.5 (3) MG/3ML IN SOLN
3.0000 mL | Freq: Once | RESPIRATORY_TRACT | Status: AC
  Administered 2024-04-16: 3 mL via RESPIRATORY_TRACT
  Filled 2024-04-16: qty 3

## 2024-04-16 MED ORDER — BENZONATATE 100 MG PO CAPS
100.0000 mg | ORAL_CAPSULE | Freq: Three times a day (TID) | ORAL | 0 refills | Status: AC
  Filled 2024-04-16: qty 21, 7d supply, fill #0

## 2024-04-16 MED ORDER — BENZONATATE 100 MG PO CAPS
100.0000 mg | ORAL_CAPSULE | Freq: Once | ORAL | Status: AC
  Administered 2024-04-16: 100 mg via ORAL
  Filled 2024-04-16: qty 1

## 2024-04-16 MED ORDER — METHYLPREDNISOLONE SODIUM SUCC 125 MG IJ SOLR
125.0000 mg | Freq: Once | INTRAMUSCULAR | Status: AC
  Administered 2024-04-16: 125 mg via INTRAVENOUS
  Filled 2024-04-16: qty 2

## 2024-04-16 NOTE — ED Triage Notes (Signed)
 Pt states that she has had cough, fever, and chills x 1-2 weeks. Pt diagnosed with pneumonia x 2 weeks ago and prescribed antibiotic.

## 2024-04-16 NOTE — ED Provider Notes (Signed)
 " [facility] EMERGENCY DEPARTMENT AT [facility] Provider Note   CSN: [id number] Arrival date & time: 04/16/24  1935     Patient presents with: Cough   Kelli Jenkins is a 63 y.o. female.  Patient is a 63 year old female with a history of tobacco use disorder, cocaine abuse, opioid abuse, and recent pneumonia who presents to the ED for increasing cough, congestion, subjective fever/chills, and chest tightness for the past 2 weeks.  Notes she did complete a course of doxycycline  but symptoms have not improved.  Has been taking over-the-counter medications with minimal relief.  Denies known sick contacts.  States she still does smoke daily.  No diagnosed COPD or asthma.  States she has been wheezing and does note occasional vomiting while coughing.  Denies headache, dizziness, syncope, chest pain, abdominal pain, diarrhea/constipation, urinary symptoms.  No further complaints.     Cough Associated symptoms: chills, fever and shortness of breath   Associated symptoms: no chest pain and no headaches        Prior to Admission medications  Medication Sig Start Date End Date Taking? Authorizing Provider  amoxicillin -clavulanate (AUGMENTIN ) 875-125 MG tablet Take 1 tablet by mouth every 12 (twelve) hours. 04/16/24  Yes Shawnmichael Parenteau, Thersia RAMAN, PA-C  azithromycin  (ZITHROMAX ) 250 MG tablet Take 1 tablet (250 mg total) by mouth daily. Take first 2 tablets together, then 1 every day until finished. 04/16/24  Yes Neysa Thersia RAMAN, PA-C  benzonatate  (TESSALON ) 100 MG capsule Take 1 capsule (100 mg total) by mouth every 8 (eight) hours. 04/16/24  Yes Lashaye Fisk, Thersia RAMAN, PA-C  predniSONE  (DELTASONE ) 20 MG tablet Take 2 tablets (40 mg total) by mouth daily for 5 days. 04/16/24 04/21/24 Yes Aleister Lady, Thersia RAMAN, PA-C  albuterol  (VENTOLIN  HFA) 108 (90 Base) MCG/ACT inhaler Inhale 2 puffs into the lungs every 6 (six) hours as needed for wheezing or shortness of breath. 03/14/22   Gonfa, Taye T, MD   albuterol  (VENTOLIN  HFA) 108 (90 Base) MCG/ACT inhaler Inhale 1-2 puffs into the lungs every 6 (six) hours as needed for wheezing or shortness of breath. 07/30/22   Rogelia Jerilynn RAMAN, MD  doxycycline  (VIBRAMYCIN ) 100 MG capsule Take 1 capsule (100 mg total) by mouth 2 (two) times daily. 03/27/24   Roselyn Carlin NOVAK, MD  escitalopram  (LEXAPRO ) 5 MG tablet Take 1 tablet (5 mg total) by mouth daily. Patient not taking: Reported on 03/13/2022 09/28/20 10/28/20  Raliegh Marsa RAMAN, DO  fluticasone  (FLONASE ) 50 MCG/ACT nasal spray Place 2 sprays into both nostrils daily. 02/24/24   Myriam Dorn BROCKS, PA  gabapentin  (NEURONTIN ) 100 MG capsule Take 1 capsule (100 mg total) by mouth 3 (three) times daily. Patient not taking: Reported on 03/13/2022 09/27/20   Raliegh Marsa RAMAN, DO  pantoprazole  (PROTONIX ) 40 MG tablet Take 1 tablet (40 mg total) by mouth daily. 03/14/22 04/13/22  Gonfa, Taye T, MD  sucralfate  (CARAFATE ) 1 g tablet Take 1 tablet (1 g total) by mouth 4 (four) times daily -  with meals and at bedtime. 02/24/24   Myriam Dorn BROCKS, PA    Allergies: Darvocet [propoxyphene n-acetaminophen ], Ibuprofen , and Tramadol     Review of Systems  Constitutional:  Positive for chills and fever.  HENT:  Positive for congestion.   Respiratory:  Positive for cough, chest tightness and shortness of breath.   Cardiovascular:  Negative for chest pain.  Gastrointestinal:  Positive for vomiting. Negative for abdominal pain, diarrhea and nausea.  Neurological:  Negative for dizziness and headaches.  Updated Vital Signs BP (!) 127/57   Pulse 85   Temp 99.7 F (37.6 C) (Oral)   Resp 18   Ht 5' 8 (1.727 m)   Wt 104.3 kg   SpO2 95%   BMI 34.97 kg/m   Physical Exam Constitutional:      Appearance: Normal appearance.  HENT:     Head: Normocephalic and atraumatic.     Mouth/Throat:     Mouth: Mucous membranes are moist.     Pharynx: Oropharynx is clear.  Cardiovascular:     Rate and Rhythm:  Normal rate.  Pulmonary:     Effort: Pulmonary effort is normal.     Breath sounds: Wheezing present.     Comments: Mild wheezing bilaterally.  No tachypnea or respiratory distress. Musculoskeletal:        General: Normal range of motion.  Skin:    General: Skin is warm and dry.  Neurological:     Mental Status: She is alert and oriented to person, place, and time.  Psychiatric:        Mood and Affect: Mood normal.        Behavior: Behavior normal.     (all labs ordered are listed, but only abnormal results are displayed) Labs Reviewed  CBC WITH DIFFERENTIAL/PLATELET - Abnormal; Notable for the following components:      Result Value   WBC 12.1 (*)    Neutro Abs 8.9 (*)    All other components within normal limits  COMPREHENSIVE METABOLIC PANEL WITH GFR - Abnormal; Notable for the following components:   Glucose, Bld 132 (*)    All other components within normal limits  RESP PANEL BY RT-PCR (RSV, FLU A&B, COVID)  [id number]    EKG: EKG Interpretation Date/Time:  Thursday April 16 2024 20:06:50 EST Ventricular Rate:  91 PR Interval:  159 QRS Duration:  78 QT Interval:  375 QTC Calculation: 462 R Axis:   82  Text Interpretation: Sinus rhythm Borderline right axis deviation Confirmed by Mannie Pac [phone]) on 04/16/2024 10:47:20 PM  Radiology: CT Angio Chest PE W and/or Wo Contrast Result Date: 04/16/2024 CLINICAL DATA:  Cough fever EXAM: CT ANGIOGRAPHY CHEST WITH CONTRAST TECHNIQUE: Multidetector CT imaging of the chest was performed using the standard protocol during bolus administration of intravenous contrast. Multiplanar CT image reconstructions and MIPs were obtained to evaluate the vascular anatomy. RADIATION DOSE REDUCTION: This exam was performed according to the departmental dose-optimization program which includes automated exposure control, adjustment of the mA and/or kV according to patient size and/or use of iterative reconstruction technique. CONTRAST:   75mL OMNIPAQUE  IOHEXOL  350 MG/ML SOLN COMPARISON:  Chest x-ray 04/16/2024, chest CT 06/29/2023 FINDINGS: Cardiovascular: Suboptimal opacification of the pulmonary arterial system limits assessment for PE. Allowing for this, no acute embolus seen to the segmental level. Nonaneurysmal aorta with no dissection. Upper normal cardiac size. No pericardial effusion Mediastinum/Nodes: Patent trachea. No thyroid  mass. No suspicious lymph nodes. Esophagus within normal limits Lungs/Pleura: No acute airspace disease, pleural effusion or pneumothorax. Small pulmonary nodules, for example 5 mm left upper lobe nodule on series 12, image 57 with adjacent 3 mm nodule. Right middle lobe pulmonary nodule measuring 3 mm on series 12, image 75. Upper Abdomen: No acute finding Musculoskeletal: No acute osseous abnormality. Review of the MIP images confirms the above findings. IMPRESSION: 1. Suboptimal opacification of the pulmonary arterial system limits assessment for PE. Allowing for this, no acute embolus is seen to the segmental level. 2. No acute airspace disease. 3.  Multiple pulmonary nodules. Most significant: Left solid pulmonary nodule within the upper lobe measuring 5 mm. No follow-up needed if patient is low-risk (and has no known or suspected primary neoplasm). Non-contrast chest CT can be considered in 12 months if patient is high-risk. This recommendation follows the consensus statement: Guidelines for Management of Incidental Pulmonary Nodules Detected on CT Images: From the Fleischner Society 2017; Radiology 2017; 284:228-243. Electronically Signed   By: Luke Bun M.D.   On: 04/16/2024 23:21   DG Chest 2 View Result Date: 04/16/2024 EXAM: 2 VIEW(S) XRAY OF THE CHEST 04/16/2024 08:14:46 PM COMPARISON: 03/26/2024 CLINICAL HISTORY: SOB FINDINGS: LUNGS AND PLEURA: No focal pulmonary opacity. No pleural effusion. No pneumothorax. HEART AND MEDIASTINUM: No acute abnormality of the cardiac and mediastinal silhouettes.  BONES AND SOFT TISSUES: Mild thoracic spondylosis. IMPRESSION: 1. No acute cardiopulmonary abnormality. Electronically signed by: Rogelia Myers MD 04/16/2024 08:22 PM EST RP Workstation: [id number]      Medications Ordered in the ED  ipratropium-albuterol  (DUONEB) 0.5-2.5 (3) MG/3ML nebulizer solution 3 mL (3 mLs Nebulization Given 04/16/24 2106)  methylPREDNISolone  sodium succinate  (SOLU-MEDROL ) 125 mg/2 mL injection 125 mg (125 mg Intravenous Given 04/16/24 2106)  benzonatate  (TESSALON ) capsule 100 mg (100 mg Oral Given 04/16/24 2146)  iohexol  (OMNIPAQUE ) 350 MG/ML injection 75 mL (75 mLs Intravenous Contrast Given 04/16/24 2258)  albuterol  (VENTOLIN  HFA) 108 (90 Base) MCG/ACT inhaler 1 puff (1 puff Inhalation Given 04/16/24 2332)                                   Medical Decision Making Patient is a 63 year old female with a history of tobacco use disorder, cocaine abuse, opiate abuse, and recent pneumonia who presents to the ED for increasing cough, congestion, subjective fever/chills, and chest tightness for the past 2 to 3 weeks.  Please see detailed HPI above.  On exam patient is alert and in no acute distress.  Physical exam as noted above.  Lungs clear to auscultation.  Vital signs stable.  Differential includes acute viral illness, bronchitis, COPD exacerbation, pneumonia, meningitis, lung mass.  Lab workup reassuring today.  Mild leukocytosis of 12.1 but otherwise unremarkable.  EKG reviewed and shows sinus rhythm.  CT PE was obtained due to concerns of ongoing cough for several weeks.  There is no acute PE or airspace disease noted today.  She does have multiple pulmonary nodules that we will need nonemergent follow-up for repeat CT in approximately 12 months.  As patient is high risk with continued no improvement, we will continue to treat empirically for bronchitis.  She was given DuoNeb treatment while in ED with improvement in wheezing.  Otherwise vital signs remained stable  including oxygen on room air.  Stable for discharge home.  Provided albuterol  inhaler while in ED.  Prescribed Augmentin  and azithromycin .  Prescribed prednisone  and tessalon  as well.  Symptomatic care discussed.  Advised PCP follow-up in 1 week if no improvement.  Return precautions provided.  Patient also made aware of lung nodules and advised to follow-up with PCP in approximately 12 months for repeat CT scan.  Patient verbalized understanding.  Amount and/or Complexity of Data Reviewed Labs: ordered. Radiology: ordered.  Risk Prescription drug management.       Final diagnoses:  Bronchitis  Multiple lung nodules on CT    ED Discharge Orders          Ordered    amoxicillin -clavulanate (AUGMENTIN ) 875-125 MG tablet  Every 12 hours        04/16/24 2331    azithromycin  (ZITHROMAX ) 250 MG tablet  Daily        04/16/24 2331    predniSONE  (DELTASONE ) 20 MG tablet  Daily        04/16/24 2331    benzonatate  (TESSALON ) 100 MG capsule  Every 8 hours        04/16/24 2331               Neysa Thersia RAMAN, PA-C 04/16/24 2336    Mannie Pac T, DO 04/19/24 1509  "

## 2024-04-16 NOTE — Discharge Instructions (Addendum)
 Begin taking Augmentin , azithromycin , and prednisone  as prescribed.  May use albuterol  inhaler every 4-6 hours as needed for any chest tightness/wheezing.  May take Tessalon  3 times a day as needed for cough.  Get plenty of rest over the next few days.  Follow-up with PCP in 1 week if no improvement.  Return to ED if any symptoms worsen including uncontrollable fevers, chest pain, difficulty breathing.  Please follow-up with PCP for evaluation of lung nodules.  You will need a repeat chest CT in approximately 12 months.

## 2024-04-17 ENCOUNTER — Other Ambulatory Visit (HOSPITAL_COMMUNITY): Payer: Self-pay

## 2024-04-18 ENCOUNTER — Other Ambulatory Visit (HOSPITAL_COMMUNITY): Payer: Self-pay

## 2024-04-27 DIAGNOSIS — Z1231 Encounter for screening mammogram for malignant neoplasm of breast: Secondary | ICD-10-CM

## 2024-05-15 ENCOUNTER — Ambulatory Visit
